# Patient Record
Sex: Female | Born: 1950
Health system: Southern US, Community
[De-identification: ages and names within clinical notes are randomized; demographics above are authoritative.]

## PROBLEM LIST (undated history)

## (undated) DIAGNOSIS — I10 Essential (primary) hypertension: Secondary | ICD-10-CM

## (undated) DIAGNOSIS — E785 Hyperlipidemia, unspecified: Secondary | ICD-10-CM

## (undated) DIAGNOSIS — IMO0001 Reserved for inherently not codable concepts without codable children: Secondary | ICD-10-CM

## (undated) DIAGNOSIS — N289 Disorder of kidney and ureter, unspecified: Secondary | ICD-10-CM

## (undated) DIAGNOSIS — IMO0002 Reserved for concepts with insufficient information to code with codable children: Secondary | ICD-10-CM

## (undated) DIAGNOSIS — J302 Other seasonal allergic rhinitis: Secondary | ICD-10-CM

## (undated) DIAGNOSIS — I729 Aneurysm of unspecified site: Secondary | ICD-10-CM

## (undated) DIAGNOSIS — E119 Type 2 diabetes mellitus without complications: Secondary | ICD-10-CM

## (undated) DIAGNOSIS — I639 Cerebral infarction, unspecified: Secondary | ICD-10-CM

## (undated) DIAGNOSIS — M1990 Unspecified osteoarthritis, unspecified site: Secondary | ICD-10-CM

## (undated) DIAGNOSIS — G5793 Unspecified mononeuropathy of bilateral lower limbs: Secondary | ICD-10-CM

## (undated) DIAGNOSIS — M109 Gout, unspecified: Secondary | ICD-10-CM

## (undated) HISTORY — DX: Unspecified osteoarthritis, unspecified site: M19.90

## (undated) HISTORY — DX: Type 2 diabetes mellitus without complications: E11.9

## (undated) HISTORY — DX: Aneurysm of unspecified site: I72.9

## (undated) HISTORY — DX: Other seasonal allergic rhinitis: J30.2

## (undated) HISTORY — DX: Hyperlipidemia, unspecified: E78.5

## (undated) HISTORY — DX: Reserved for concepts with insufficient information to code with codable children: IMO0002

## (undated) HISTORY — DX: Cerebral infarction, unspecified: I63.9

## (undated) HISTORY — DX: Gout, unspecified: M10.9

## (undated) HISTORY — PX: FOOT SURGERY: SHX648

---

## 1982-05-19 HISTORY — PX: VAGINAL HYSTERECTOMY: SUR661

## 1997-05-19 HISTORY — PX: CHOLECYSTECTOMY: SHX55

## 2001-03-20 ENCOUNTER — Emergency Department (HOSPITAL_COMMUNITY): Admission: EM | Admit: 2001-03-20 | Discharge: 2001-03-20 | Payer: Self-pay | Admitting: *Deleted

## 2001-03-20 ENCOUNTER — Encounter: Payer: Self-pay | Admitting: *Deleted

## 2002-05-23 ENCOUNTER — Encounter: Payer: Self-pay | Admitting: Unknown Physician Specialty

## 2002-05-23 ENCOUNTER — Ambulatory Visit (HOSPITAL_COMMUNITY): Admission: RE | Admit: 2002-05-23 | Discharge: 2002-05-23 | Payer: Self-pay | Admitting: Unknown Physician Specialty

## 2002-06-29 ENCOUNTER — Ambulatory Visit (HOSPITAL_COMMUNITY): Admission: RE | Admit: 2002-06-29 | Discharge: 2002-06-29 | Payer: Self-pay | Admitting: Internal Medicine

## 2003-05-01 ENCOUNTER — Emergency Department (HOSPITAL_COMMUNITY): Admission: EM | Admit: 2003-05-01 | Discharge: 2003-05-01 | Payer: Self-pay | Admitting: Emergency Medicine

## 2004-01-16 ENCOUNTER — Ambulatory Visit (HOSPITAL_COMMUNITY): Admission: RE | Admit: 2004-01-16 | Discharge: 2004-01-16 | Payer: Self-pay | Admitting: Family Medicine

## 2004-07-31 ENCOUNTER — Emergency Department (HOSPITAL_COMMUNITY): Admission: EM | Admit: 2004-07-31 | Discharge: 2004-07-31 | Payer: Self-pay | Admitting: Emergency Medicine

## 2005-02-27 ENCOUNTER — Ambulatory Visit (HOSPITAL_COMMUNITY): Admission: RE | Admit: 2005-02-27 | Discharge: 2005-02-27 | Payer: Self-pay | Admitting: Family Medicine

## 2005-09-10 ENCOUNTER — Ambulatory Visit (HOSPITAL_COMMUNITY): Admission: RE | Admit: 2005-09-10 | Discharge: 2005-09-10 | Payer: Self-pay | Admitting: Family Medicine

## 2006-07-03 ENCOUNTER — Ambulatory Visit (HOSPITAL_COMMUNITY): Admission: RE | Admit: 2006-07-03 | Discharge: 2006-07-03 | Payer: Self-pay | Admitting: Family Medicine

## 2007-06-09 ENCOUNTER — Ambulatory Visit (HOSPITAL_COMMUNITY): Admission: RE | Admit: 2007-06-09 | Discharge: 2007-06-09 | Payer: Self-pay | Admitting: Family Medicine

## 2007-07-12 ENCOUNTER — Ambulatory Visit (HOSPITAL_COMMUNITY): Admission: RE | Admit: 2007-07-12 | Discharge: 2007-07-12 | Payer: Self-pay | Admitting: Orthopedic Surgery

## 2007-08-12 ENCOUNTER — Ambulatory Visit (HOSPITAL_COMMUNITY): Admission: RE | Admit: 2007-08-12 | Discharge: 2007-08-12 | Payer: Self-pay | Admitting: Family Medicine

## 2008-10-04 ENCOUNTER — Ambulatory Visit (HOSPITAL_COMMUNITY): Admission: RE | Admit: 2008-10-04 | Discharge: 2008-10-04 | Payer: Self-pay | Admitting: Family Medicine

## 2009-10-11 ENCOUNTER — Ambulatory Visit (HOSPITAL_COMMUNITY): Admission: RE | Admit: 2009-10-11 | Discharge: 2009-10-11 | Payer: Self-pay | Admitting: Family Medicine

## 2010-05-21 ENCOUNTER — Encounter
Admission: RE | Admit: 2010-05-21 | Discharge: 2010-05-21 | Payer: Self-pay | Source: Home / Self Care | Attending: Sports Medicine | Admitting: Sports Medicine

## 2010-06-03 ENCOUNTER — Encounter
Admission: RE | Admit: 2010-06-03 | Discharge: 2010-06-03 | Payer: Self-pay | Source: Home / Self Care | Attending: Sports Medicine | Admitting: Sports Medicine

## 2010-06-08 ENCOUNTER — Other Ambulatory Visit: Payer: Self-pay | Admitting: Sports Medicine

## 2010-06-08 DIAGNOSIS — M5020 Other cervical disc displacement, unspecified cervical region: Secondary | ICD-10-CM

## 2010-06-09 ENCOUNTER — Encounter: Payer: Self-pay | Admitting: Family Medicine

## 2010-06-21 ENCOUNTER — Other Ambulatory Visit: Payer: Self-pay

## 2010-07-02 ENCOUNTER — Encounter: Payer: Self-pay | Admitting: Sports Medicine

## 2010-10-04 NOTE — Op Note (Signed)
NAME:  Cheryl Ortiz, Cheryl Ortiz                      ACCOUNT NO.:  [id number]   MEDICAL RECORD NO.:  [id number]                   PATIENT TYPE:  AMB   LOCATION:  DAY                                  FACILITY:  [facility]   PHYSICIAN:  Lionel December, M.D.                 DATE OF BIRTH:  January 11, 1951   DATE OF PROCEDURE:  06/29/2002  DATE OF DISCHARGE:                                 OPERATIVE REPORT   PROCEDURE:  Total colonoscopy with coagulation of multiple tiny polyps at  the rectosigmoid area.   INDICATIONS FOR PROCEDURE:  The patient is a 60 year old African American  female who has had two colonoscopies previously.  The first one was in 1997  and the second one was in 10/1998.  She has had multiple small polyps  coagulated.  She had two polyps removed during her last colonoscopy.  One  was an adenoma.  She is returning for surveillance exam.  She has chronic  constipation and uses laxatives on a p.r.n. basis.   The procedure was reviewed with the patient, and informed consent was  obtained.   PREOPERATIVE DIAGNOSES:  Demerol 50 mg IV, Versed 6 mg IV in divided doses.   INSTRUMENT USED:  Olympus video system.   FINDINGS:  The procedure was performed in the endoscopy suite.  The  patient's vital signs and O2 saturations were monitored during the procedure  and remained stable.  The patient was placed in the left lateral position  and rectal exam was performed.  No abnormality noted on external or digital  exam.  The scope was placed in the rectum and advanced to the region of the  sigmoid colon and beyond.  The preparation was excellent.  The scope was  passed to the cecum which was identified by the appendiceal orifice and  ileocecal valve.  As the scope was withdrawn, the colonic mucosa was  carefully examined.  Multiple small polyps were noted in the distal sigmoid  colon and rectum.  Most of these appeared to be hyperplastic.  There were  two that appeared to be adenomas.  One was at  the distal sigmoid colon and  one was in the rectum.  Both of these and several others were coagulated  using snare tip.  The scope was retroflexed to examine the anorectal  junction which was unremarkable.  The endoscope was then withdrawn.  The  patient tolerated the procedure well.   FINAL DIAGNOSIS:  Multiple tiny polyps at distal sigmoid colon and rectum.  Most of these were coagulated.  The small ones that appeared to be  hyperplastic were left alone.    RECOMMENDATIONS:  She is to continue a high fiber diet and start Citrucel  one tablespoon full daily, Lactulose one to two tablespoons full q.h.s.  p.r.n.  Prescription given for one with multiple refills.   Will also ask her not to take aspirin for 10 days.  We  will bring her back  for another exam in five years from now.                                               Lionel December, M.D.    NR/MEDQ  D:  06/29/2002  T:  06/29/2002  Job:  [id number]   cc:   Corrie Mckusick, M.D.  [address] [place]  [place]   [id number]  Fax: [phone]

## 2010-10-10 ENCOUNTER — Other Ambulatory Visit (HOSPITAL_COMMUNITY): Payer: Self-pay | Admitting: Family Medicine

## 2010-10-10 DIAGNOSIS — Z139 Encounter for screening, unspecified: Secondary | ICD-10-CM

## 2010-10-18 ENCOUNTER — Ambulatory Visit (HOSPITAL_COMMUNITY)
Admission: RE | Admit: 2010-10-18 | Discharge: 2010-10-18 | Disposition: A | Discharge status: Home / Self Care | Payer: Managed Care, Other (non HMO) | Source: Ambulatory Visit | Attending: Family Medicine | Admitting: Family Medicine

## 2010-10-18 DIAGNOSIS — Z139 Encounter for screening, unspecified: Secondary | ICD-10-CM

## 2010-10-18 DIAGNOSIS — Z1231 Encounter for screening mammogram for malignant neoplasm of breast: Secondary | ICD-10-CM | POA: Insufficient documentation

## 2011-09-10 ENCOUNTER — Other Ambulatory Visit (HOSPITAL_COMMUNITY): Payer: Self-pay | Admitting: Family Medicine

## 2011-09-10 DIAGNOSIS — Z139 Encounter for screening, unspecified: Secondary | ICD-10-CM

## 2011-10-24 ENCOUNTER — Ambulatory Visit (HOSPITAL_COMMUNITY)
Admission: RE | Admit: 2011-10-24 | Discharge: 2011-10-24 | Disposition: A | Discharge status: Home / Self Care | Payer: Managed Care, Other (non HMO) | Source: Ambulatory Visit | Attending: Family Medicine | Admitting: Family Medicine

## 2011-10-24 DIAGNOSIS — Z139 Encounter for screening, unspecified: Secondary | ICD-10-CM

## 2011-10-24 DIAGNOSIS — Z1231 Encounter for screening mammogram for malignant neoplasm of breast: Secondary | ICD-10-CM | POA: Insufficient documentation

## 2012-01-09 ENCOUNTER — Ambulatory Visit (HOSPITAL_COMMUNITY)
Admission: RE | Admit: 2012-01-09 | Discharge: 2012-01-09 | Disposition: A | Discharge status: Home / Self Care | Payer: Managed Care, Other (non HMO) | Source: Ambulatory Visit | Attending: Family Medicine | Admitting: Family Medicine

## 2012-01-09 ENCOUNTER — Other Ambulatory Visit (HOSPITAL_COMMUNITY): Payer: Self-pay | Admitting: Family Medicine

## 2012-01-09 DIAGNOSIS — G8929 Other chronic pain: Secondary | ICD-10-CM

## 2012-05-14 ENCOUNTER — Encounter (INDEPENDENT_AMBULATORY_CARE_PROVIDER_SITE_OTHER): Payer: Self-pay

## 2012-10-28 ENCOUNTER — Other Ambulatory Visit (HOSPITAL_COMMUNITY): Payer: Self-pay | Admitting: Family Medicine

## 2012-10-28 DIAGNOSIS — Z139 Encounter for screening, unspecified: Secondary | ICD-10-CM

## 2012-11-08 ENCOUNTER — Ambulatory Visit (HOSPITAL_COMMUNITY)
Admission: RE | Admit: 2012-11-08 | Discharge: 2012-11-08 | Disposition: A | Discharge status: Home / Self Care | Payer: Managed Care, Other (non HMO) | Source: Ambulatory Visit | Attending: Family Medicine | Admitting: Family Medicine

## 2012-11-08 DIAGNOSIS — Z139 Encounter for screening, unspecified: Secondary | ICD-10-CM

## 2012-11-08 DIAGNOSIS — Z1231 Encounter for screening mammogram for malignant neoplasm of breast: Secondary | ICD-10-CM | POA: Insufficient documentation

## 2013-02-17 ENCOUNTER — Encounter: Payer: Self-pay | Admitting: *Deleted

## 2013-02-17 DIAGNOSIS — E785 Hyperlipidemia, unspecified: Secondary | ICD-10-CM

## 2013-02-17 DIAGNOSIS — E119 Type 2 diabetes mellitus without complications: Secondary | ICD-10-CM | POA: Insufficient documentation

## 2013-02-17 DIAGNOSIS — I10 Essential (primary) hypertension: Secondary | ICD-10-CM

## 2013-02-17 DIAGNOSIS — M1990 Unspecified osteoarthritis, unspecified site: Secondary | ICD-10-CM

## 2013-02-18 ENCOUNTER — Encounter: Payer: Self-pay | Admitting: Cardiology

## 2013-02-18 ENCOUNTER — Ambulatory Visit (INDEPENDENT_AMBULATORY_CARE_PROVIDER_SITE_OTHER): Payer: BC Managed Care – PPO | Admitting: Cardiology

## 2013-02-18 ENCOUNTER — Encounter: Payer: Self-pay | Admitting: *Deleted

## 2013-02-18 VITALS — BP 139/86 | HR 88 | Ht 66.0 in | Wt 257.0 lb

## 2013-02-18 DIAGNOSIS — R072 Precordial pain: Secondary | ICD-10-CM

## 2013-02-18 DIAGNOSIS — E785 Hyperlipidemia, unspecified: Secondary | ICD-10-CM

## 2013-02-18 DIAGNOSIS — I10 Essential (primary) hypertension: Secondary | ICD-10-CM

## 2013-02-18 DIAGNOSIS — E119 Type 2 diabetes mellitus without complications: Secondary | ICD-10-CM | POA: Insufficient documentation

## 2013-02-18 NOTE — Progress Notes (Signed)
    Clinical Summary Cheryl Ortiz is a 62 y.o.female referred for cardiology consultation by Dr. Loleta Chance. She states that she is fairly active both with her family and also work, busy with the department of [employer]. She wanted further cardiac evaluation, prompted by her pastor's wife's recent diagnosis of CAD.   From his symptom perspective, she has noticed some fairly atypical chest pain symptoms, at times describing a sharp fleeting pain, other times a more burning discomfort. Nothing clearly associated with exertion.  She tries to walk for exercise, has not done this regularly during the hot summer months. She did do a combination run/walk 5K back in June. She has not undergone prior cardiac risk stratification. ECG today shows sinus rhythm with low voltage.  Recent lab work in September showed potassium 3.8, BUN 17, creatinine 1.1, hemoglobin A1c 6.8%.   Allergies  Allergen Reactions  . Codeine     Current Outpatient Prescriptions  Medication Sig Dispense Refill  . aspirin 81 MG tablet Take 81 mg by mouth daily.      . hydrochlorothiazide (HYDRODIURIL) 25 MG tablet Take 25 mg by mouth daily.      . metFORMIN (GLUCOPHAGE) 500 MG tablet Take 500 mg by mouth 2 (two) times daily with a meal.       No current facility-administered medications for this visit.    Past Medical History  Diagnosis Date  . Type 2 diabetes mellitus   . Hyperlipidemia   . Seasonal allergies   . Osteoarthritis     Past Surgical History  Procedure Laterality Date  . Vaginal hysterectomy  1984  . Cholecystectomy  1999    Family History  Problem Relation Age of Onset  . Hypertension Mother   . Cerebral palsy Brother   . Hypertension Brother   . Heart disease Brother   . Pneumonia Father     Social History [name] reports that she has quit smoking. Her smoking use included Cigarettes. She smoked 0.00 packs per day. She does not have any smokeless tobacco history on file. Ms.  J[place]lin reports that she does not drink alcohol.  Review of Systems No palpitations or syncope. No claudication. Stable appetite. No bleeding problems. Has had some trouble with wheezing and probable seasonal allergies. Otherwise negative.  Physical Examination Filed Vitals:   02/18/13 1351  BP: 139/86  Pulse: 88   Filed Weights   02/18/13 1351  Weight: 257 lb (116.574 kg)   Obese woman in no acute distress. HEENT: Conjunctiva and lids normal, oropharynx clear. Neck: Supple, no elevated JVP or carotid bruits, no thyromegaly. Lungs: Clear to auscultation, nonlabored breathing at rest. Cardiac: Regular rate and rhythm, no S3 or significant systolic murmur, no pericardial rub. Abdomen: Soft, nontender, bowel sounds present. Extremities: No pitting edema, distal pulses 2+. Skin: Warm and dry. Musculoskeletal: No kyphosis. Neuropsychiatric: Alert and oriented x3, affect grossly appropriate.   Problem List and Plan   Precordial pain Largely atypical symptoms, however in the setting of diabetes mellitus and hyperlipidemia. ECG shows low voltage but otherwise no acute findings. She has not undergone any prior risk stratification. Plan will be to obtain an exercise Cardiolite, we will call her with the results.  Essential hypertension, benign On HCTZ, followed by Dr. Loleta Chance.  Type 2 diabetes mellitus Recent hemoglobin A1c 6.8%, patient stated this is down from nearly 11%.  Hyperlipidemia No specific medical treatment at this time.    Jonelle Sidle, M.D., F.A.C.C.

## 2013-02-18 NOTE — Assessment & Plan Note (Signed)
Largely atypical symptoms, however in the setting of diabetes mellitus and hyperlipidemia. ECG shows low voltage but otherwise no acute findings. She has not undergone any prior risk stratification. Plan will be to obtain an exercise Cardiolite, we will call her with the results.

## 2013-02-18 NOTE — Assessment & Plan Note (Signed)
No specific medical treatment at this time.

## 2013-02-18 NOTE — Patient Instructions (Addendum)
Continue all current medications. Your physician has requested that you have en exercise stress myoview. For further information please visit [email or web address] Please follow instruction sheet, as given. Office will contact with results via phone or letter.    Follow up as needed (based on test results above)

## 2013-02-18 NOTE — Assessment & Plan Note (Signed)
On HCTZ, followed by Dr. Loleta Chance.

## 2013-02-18 NOTE — Assessment & Plan Note (Signed)
Recent hemoglobin A1c 6.8%, patient stated this is down from nearly 11%.

## 2013-02-24 ENCOUNTER — Encounter (HOSPITAL_COMMUNITY): Payer: BC Managed Care – PPO

## 2013-04-21 ENCOUNTER — Telehealth: Payer: Self-pay | Admitting: *Deleted

## 2013-04-21 NOTE — Telephone Encounter (Signed)
Message copied by Ovidio Kin on Thu Apr 21, 2013 10:06 AM ------      Message from: San Jetty      Created: Thu Apr 21, 2013  9:36 AM       SHE DOES NOT WANT TO SCHEDULE THIS, SHE WILL CALL us IF SHE CHANGES HER MIND            ----- Message -----         From: Ovidio Kin, LPN         Sent: 04/21/2013   9:18 AM           To: Faythe Ghee            Pt advised at cancellation that she would call back to reschedule if she needs to on 02-24-13 for her exercise myoview, can you call pt to see if she wants to reschedule thanks        ------

## 2013-04-21 NOTE — Telephone Encounter (Signed)
FYI: Several attempts have been made to contact pt via telephone to schedule this EXERCISE MYOVIEW with no resolve, the pt answered today and gave the statement below:   SHE DOES NOT WANT TO SCHEDULE THIS, SHE WILL CALL us IF SHE CHANGES HER MIND

## 2013-05-06 ENCOUNTER — Encounter: Payer: Self-pay | Admitting: *Deleted

## 2013-05-06 ENCOUNTER — Telehealth: Payer: Self-pay | Admitting: *Deleted

## 2013-05-06 NOTE — Telephone Encounter (Signed)
Noted pt decided not to have her test advised by her provider, noted in closed phone note

## 2013-08-12 ENCOUNTER — Emergency Department (HOSPITAL_COMMUNITY)
Admission: EM | Admit: 2013-08-12 | Discharge: 2013-08-12 | Disposition: A | Discharge status: Home / Self Care | Payer: BC Managed Care – PPO | Attending: Emergency Medicine | Admitting: Emergency Medicine

## 2013-08-12 ENCOUNTER — Encounter (HOSPITAL_COMMUNITY): Payer: Self-pay | Admitting: Emergency Medicine

## 2013-08-12 ENCOUNTER — Emergency Department (HOSPITAL_COMMUNITY): Payer: BC Managed Care – PPO

## 2013-08-12 DIAGNOSIS — Z79899 Other long term (current) drug therapy: Secondary | ICD-10-CM | POA: Insufficient documentation

## 2013-08-12 DIAGNOSIS — Z7982 Long term (current) use of aspirin: Secondary | ICD-10-CM | POA: Insufficient documentation

## 2013-08-12 DIAGNOSIS — M1990 Unspecified osteoarthritis, unspecified site: Secondary | ICD-10-CM | POA: Insufficient documentation

## 2013-08-12 DIAGNOSIS — N200 Calculus of kidney: Secondary | ICD-10-CM | POA: Insufficient documentation

## 2013-08-12 DIAGNOSIS — E119 Type 2 diabetes mellitus without complications: Secondary | ICD-10-CM | POA: Insufficient documentation

## 2013-08-12 DIAGNOSIS — Z87891 Personal history of nicotine dependence: Secondary | ICD-10-CM | POA: Insufficient documentation

## 2013-08-12 LAB — COMPREHENSIVE METABOLIC PANEL
ALT: 23 U/L (ref 0–35)
AST: 27 U/L (ref 0–37)
Albumin: 3.9 g/dL (ref 3.5–5.2)
Alkaline Phosphatase: 74 U/L (ref 39–117)
BUN: 21 mg/dL (ref 6–23)
CO2: 30 mEq/L (ref 19–32)
Calcium: 9.7 mg/dL (ref 8.4–10.5)
Chloride: 97 mEq/L (ref 96–112)
Creatinine, Ser: 1.16 mg/dL — ABNORMAL HIGH (ref 0.50–1.10)
GFR calc Af Amer: 58 mL/min — ABNORMAL LOW (ref >90)
GFR calc non Af Amer: 50 mL/min — ABNORMAL LOW (ref >90)
Glucose, Bld: 139 mg/dL — ABNORMAL HIGH (ref 70–99)
Potassium: 3.6 mEq/L — ABNORMAL LOW (ref 3.7–5.3)
Sodium: 140 mEq/L (ref 137–147)
Total Bilirubin: 0.5 mg/dL (ref 0.3–1.2)
Total Protein: 7.9 g/dL (ref 6.0–8.3)

## 2013-08-12 LAB — CBC WITH DIFFERENTIAL/PLATELET
Basophils Absolute: 0.1 10*3/uL (ref 0.0–0.1)
Basophils Relative: 1 % (ref 0–1)
Eosinophils Absolute: 0.3 10*3/uL (ref 0.0–0.7)
Eosinophils Relative: 3 % (ref 0–5)
HCT: 42.4 % (ref 36.0–46.0)
Hemoglobin: 14 g/dL (ref 12.0–15.0)
Lymphocytes Relative: 19 % (ref 12–46)
Lymphs Abs: 2.1 10*3/uL (ref 0.7–4.0)
MCH: 29.7 pg (ref 26.0–34.0)
MCHC: 33 g/dL (ref 30.0–36.0)
MCV: 90 fL (ref 78.0–100.0)
Monocytes Absolute: 0.7 10*3/uL (ref 0.1–1.0)
Monocytes Relative: 7 % (ref 3–12)
Neutro Abs: 7.9 10*3/uL — ABNORMAL HIGH (ref 1.7–7.7)
Neutrophils Relative %: 71 % (ref 43–77)
Platelets: 293 10*3/uL (ref 150–400)
RBC: 4.71 MIL/uL (ref 3.87–5.11)
RDW: 13.2 % (ref 11.5–15.5)
WBC: 11.1 10*3/uL — ABNORMAL HIGH (ref 4.0–10.5)

## 2013-08-12 LAB — URINE MICROSCOPIC-ADD ON

## 2013-08-12 LAB — URINALYSIS, ROUTINE W REFLEX MICROSCOPIC
Bilirubin Urine: NEGATIVE
Glucose, UA: NEGATIVE mg/dL
Ketones, ur: NEGATIVE mg/dL
Leukocytes, UA: NEGATIVE
Nitrite: NEGATIVE
Protein, ur: NEGATIVE mg/dL
Specific Gravity, Urine: 1.025 (ref 1.005–1.030)
Urobilinogen, UA: 0.2 mg/dL (ref 0.0–1.0)
pH: 5.5 (ref 5.0–8.0)

## 2013-08-12 LAB — LIPASE, BLOOD: Lipase: 44 U/L (ref 11–59)

## 2013-08-12 MED ORDER — IOHEXOL 300 MG/ML  SOLN
50.0000 mL | Freq: Once | INTRAMUSCULAR | Status: AC | PRN
Start: 2013-08-12 — End: 2013-08-12
  Administered 2013-08-12: 50 mL via ORAL

## 2013-08-12 MED ORDER — IOHEXOL 300 MG/ML  SOLN
100.0000 mL | Freq: Once | INTRAMUSCULAR | Status: AC | PRN
  Administered 2013-08-12: 100 mL via INTRAVENOUS

## 2013-08-12 MED ORDER — SODIUM CHLORIDE 0.9 % IV BOLUS (SEPSIS)
1000.0000 mL | Freq: Once | INTRAVENOUS | Status: AC
  Administered 2013-08-12: 1000 mL via INTRAVENOUS

## 2013-08-12 MED ORDER — HYDROMORPHONE HCL PF 1 MG/ML IJ SOLN
1.0000 mg | Freq: Once | INTRAMUSCULAR | Status: AC
  Administered 2013-08-12: 1 mg via INTRAVENOUS
  Filled 2013-08-12: qty 1

## 2013-08-12 MED ORDER — ONDANSETRON HCL 4 MG/2ML IJ SOLN
4.0000 mg | Freq: Once | INTRAMUSCULAR | Status: AC
  Administered 2013-08-12: 4 mg via INTRAVENOUS
  Filled 2013-08-12: qty 2

## 2013-08-12 NOTE — ED Notes (Signed)
Rt side abd pain, onset 6 pm. NO NVD,  Frequency of urination.  No fever or chills

## 2013-08-12 NOTE — Discharge Instructions (Signed)
Kidney Stones  Kidney stones (urolithiasis) are deposits that form inside your kidneys. The intense pain is caused by the stone moving through the urinary tract. When the stone moves, the ureter goes into spasm around the stone. The stone is usually passed in the urine.   CAUSES   · A disorder that makes certain neck glands produce too much parathyroid hormone (primary hyperparathyroidism).  · A buildup of uric acid crystals, similar to gout in your joints.  · Narrowing (stricture) of the ureter.  · A kidney obstruction present at birth (congenital obstruction).  · Previous surgery on the kidney or ureters.  · Numerous kidney infections.  SYMPTOMS   · Feeling sick to your stomach (nauseous).  · Throwing up (vomiting).  · Blood in the urine (hematuria).  · Pain that usually spreads (radiates) to the groin.  · Frequency or urgency of urination.  DIAGNOSIS   · Taking a history and physical exam.  · Blood or urine tests.  · CT scan.  · Occasionally, an examination of the inside of the urinary bladder (cystoscopy) is performed.  TREATMENT   · Observation.  · Increasing your fluid intake.  · Extracorporeal shock wave lithotripsy This is a noninvasive procedure that uses shock waves to break up kidney stones.  · Surgery may be needed if you have severe pain or persistent obstruction. There are various surgical procedures. Most of the procedures are performed with the use of small instruments. Only small incisions are needed to accommodate these instruments, so recovery time is minimized.  The size, location, and chemical composition are all important variables that will determine the proper choice of action for you. Talk to your health care provider to better understand your situation so that you will minimize the risk of injury to yourself and your kidney.   HOME CARE INSTRUCTIONS   · Drink enough water and fluids to keep your urine clear or pale yellow. This will help you to pass the stone or stone fragments.  · Strain  all urine through the provided strainer. Keep all particulate matter and stones for your health care provider to see. The stone causing the pain may be as small as a grain of salt. It is very important to use the strainer each and every time you pass your urine. The collection of your stone will allow your health care provider to analyze it and verify that a stone has actually passed. The stone analysis will often identify what you can do to reduce the incidence of recurrences.  · Only take over-the-counter or prescription medicines for pain, discomfort, or fever as directed by your health care provider.  · Make a follow-up appointment with your health care provider as directed.  · Get follow-up X-rays if required. The absence of pain does not always mean that the stone has passed. It may have only stopped moving. If the urine remains completely obstructed, it can cause loss of kidney function or even complete destruction of the kidney. It is your responsibility to make sure X-rays and follow-ups are completed. Ultrasounds of the kidney can show blockages and the status of the kidney. Ultrasounds are not associated with any radiation and can be performed easily in a matter of minutes.  SEEK MEDICAL CARE IF:  · You experience pain that is progressive and unresponsive to any pain medicine you have been prescribed.  SEEK IMMEDIATE MEDICAL CARE IF:   · Pain cannot be controlled with the prescribed medicine.  · You have a fever   or shaking chills.  · The severity or intensity of pain increases over 18 hours and is not relieved by pain medicine.  · You develop a new onset of abdominal pain.  · You feel faint or pass out.  · You are unable to urinate.  MAKE SURE YOU:   · Understand these instructions.  · Will watch your condition.  · Will get help right away if you are not doing well or get worse.  Document Released: 05/05/2005 Document Revised: 01/05/2013 Document Reviewed: 10/06/2012  Exit[employer]® Patient Information ©2014  [employer].

## 2013-08-12 NOTE — ED Provider Notes (Signed)
CSN: [id number]     Arrival date & time 08/12/13  1930 History  This chart was scribed for Cheryl B. Karle Starch, MD by Eston Mould, ED Scribe. This patient was seen in room [facility] and the patient's care was started at 9:05 PM.   Chief Complaint  Patient presents with  . Abdominal Pain   The history is provided by the patient. No language interpreter was used.   HPI Comments: Cheryl Ortiz is a 63 y.o. female who presents to the Emergency Department complaining of ongoing RUQ abd pain that began 3 hours ago. Pt states she was cleaning her house, began to get dressed to leave home and states her abd pain gradually worsened. She states her abd pain is mostly on her R side and denies pain that radiates or on the L side. She states her pain "comes in waves". Pt states movement worsens the pain but states she is in pain in general. She states she has had frequency but denies hematuria . Pt reports having a cholecystectomy and hysterectomy in the past. Pt has no prior hx of a kidney stone. Pt denies emesis.  Past Medical History  Diagnosis Date  . Type 2 diabetes mellitus   . Hyperlipidemia   . Seasonal allergies   . Osteoarthritis    Past Surgical History  Procedure Laterality Date  . Vaginal hysterectomy  1984  . Cholecystectomy  1999   Family History  Problem Relation Age of Onset  . Hypertension Mother   . Cerebral palsy Brother   . Hypertension Brother   . Heart disease Brother   . Pneumonia Father    History  Substance Use Topics  . Smoking status: Former Smoker    Types: Cigarettes  . Smokeless tobacco: Not on file  . Alcohol Use: No   OB History   Grav Para Term Preterm Abortions TAB SAB Ect Mult Living                 Review of Systems  Gastrointestinal: Positive for abdominal pain.  A complete 10 system review of systems was obtained and all systems are negative except as noted in the HPI and PMH.   Allergies  Codeine  Home Medications    Current Outpatient Rx  Name  Route  Sig  Dispense  Refill  . aspirin 81 MG tablet   Oral   Take 81 mg by mouth daily.         . hydrochlorothiazide (HYDRODIURIL) 25 MG tablet   Oral   Take 25 mg by mouth daily.         . metFORMIN (GLUCOPHAGE) 500 MG tablet   Oral   Take 500 mg by mouth 2 (two) times daily with a meal.          BP 171/68  Pulse 84  Temp(Src) 98.1 F (36.7 C) (Oral)  Resp 20  SpO2 97%  Physical Exam  Nursing note and vitals reviewed. Constitutional: She is oriented to person, place, and time. She appears well-developed and well-nourished.  HENT:  Head: Normocephalic and atraumatic.  Eyes: EOM are normal. Pupils are equal, round, and reactive to light.  Neck: Normal range of motion. Neck supple.  Cardiovascular: Normal rate, normal heart sounds and intact distal pulses.   Pulmonary/Chest: Effort normal and breath sounds normal.  Abdominal: Bowel sounds are normal. She exhibits no distension. There is no tenderness. There is no rebound and no guarding.  Mild tenderness to RUQ.  Musculoskeletal: Normal range of motion.  She exhibits no edema and no tenderness.  Neurological: She is alert and oriented to person, place, and time. She has normal strength. No cranial nerve deficit or sensory deficit.  Skin: Skin is warm and dry. No rash noted.  Psychiatric: She has a normal mood and affect.   ED Course  Procedures DIAGNOSTIC STUDIES: Oxygen Saturation is 97% on RA, normal by my interpretation.    COORDINATION OF CARE: 9:09 PM-Discussed treatment plan which includes administer pain medications and CT abd. Pt agreed to plan.   Labs Review Labs Reviewed  URINALYSIS, ROUTINE W REFLEX MICROSCOPIC - Abnormal; Notable for the following:    Color, Urine YELLOW (*)    APPearance HAZY (*)    Hgb urine dipstick SMALL (*)    All other components within normal limits  URINE MICROSCOPIC-ADD ON - Abnormal; Notable for the following:    Casts HYALINE CASTS (*)     All other components within normal limits  CBC WITH DIFFERENTIAL - Abnormal; Notable for the following:    WBC 11.1 (*)    Neutro Abs 7.9 (*)    All other components within normal limits  COMPREHENSIVE METABOLIC PANEL - Abnormal; Notable for the following:    Potassium 3.6 (*)    Glucose, Bld 139 (*)    Creatinine, Ser 1.16 (*)    GFR calc non Af Amer 50 (*)    GFR calc Af Amer 58 (*)    All other components within normal limits  LIPASE, BLOOD   Imaging Review Ct Abdomen Pelvis W Contrast  08/12/2013   CLINICAL DATA:  Right-sided abdominal pain. Evaluate for appendicitis or kidney stone.  EXAM: CT ABDOMEN AND PELVIS WITH CONTRAST  TECHNIQUE: Multidetector CT imaging of the abdomen and pelvis was performed using the standard protocol following bolus administration of intravenous contrast.  CONTRAST:  40mL OMNIPAQUE IOHEXOL 300 MG/ML SOLN, 179mL OMNIPAQUE IOHEXOL 300 MG/ML SOLN  COMPARISON:  None.  FINDINGS: BODY WALL: Fatty periumbilical hernia.  LOWER CHEST: Unremarkable.  ABDOMEN/PELVIS:  Liver: Diffuse fatty infiltration.  Biliary: Cholecystectomy, which likely accounts for mild common bile duct prominence within the hepatic hilum.  Pancreas: Unremarkable.  Spleen: Unremarkable.  Adrenals: Unremarkable.  Kidneys and ureters: Mild right hydroureteronephrosis with perinephric and periureteric edema. Although there is no visible stone within the ureter, there is a 5 mm stone dependent in the urinary bladder. Two punctate calculi in interpolar left kidney. There are punctate stones in the upper and lower pole right kidney.  Bladder: Bladder stone as above.  Reproductive: Hysterectomy.  Bowel: Few colonic diverticula.  No obstruction. Normal appendix.  Retroperitoneum: No mass or adenopathy.  Peritoneum: No free fluid or gas.  Vascular: No acute abnormality.  OSSEOUS: Degenerative appearing sclerosis around the SI joints and symphysis pubis.  IMPRESSION: 1. 5 mm calculus in the bladder, recently  passed from the right kidney. Mild right hydroureteronephrosis is likely from persistent ureteric edema. 2. Bilateral nonobstructive nephrolithiasis.   Electronically Signed   By: Jorje Guild M.D.   On: 08/12/2013 23:17     EKG Interpretation None     MDM   Final diagnoses:  Kidney stone    CT images reviewed, discussed results with the patient. She is currently feeling better, she has urinated since returning from CT and likely has already passed the stone. Will give strainer to use at home just in case it remains in the bladder. Doubt she will need any pain meds.   I personally performed the services described in this  documentation, which was scribed in my presence. The recorded information has been reviewed and is accurate.      Cheryl B. Karle Starch, MD 08/12/13 2329

## 2013-08-12 NOTE — ED Notes (Signed)
nad noted prior to dc. Dc instructions reviewed and explained. Voiced understanding.  

## 2013-08-25 ENCOUNTER — Telehealth (HOSPITAL_COMMUNITY): Payer: Self-pay | Admitting: Dietician

## 2013-08-25 NOTE — Telephone Encounter (Signed)
Pt was a no-show for appointment (DM Class) scheduled for 08/25/2013 at 1730. 

## 2013-09-09 ENCOUNTER — Ambulatory Visit (INDEPENDENT_AMBULATORY_CARE_PROVIDER_SITE_OTHER): Payer: BC Managed Care – PPO | Admitting: Adult Health

## 2013-09-09 ENCOUNTER — Encounter: Payer: Self-pay | Admitting: Adult Health

## 2013-09-09 VITALS — BP 124/80 | HR 76 | Ht 66.0 in | Wt 254.0 lb

## 2013-09-09 DIAGNOSIS — Z1212 Encounter for screening for malignant neoplasm of rectum: Secondary | ICD-10-CM

## 2013-09-09 DIAGNOSIS — Z01419 Encounter for gynecological examination (general) (routine) without abnormal findings: Secondary | ICD-10-CM

## 2013-09-09 LAB — HEMOCCULT GUIAC POC 1CARD (OFFICE): Fecal Occult Blood, POC: NEGATIVE

## 2013-09-09 NOTE — Patient Instructions (Signed)
Physical in 1 year Mammogram yearly  Colonoscopy per Dr Laural Golden Labs with Dr Berdine Addison

## 2013-09-09 NOTE — Progress Notes (Signed)
Patient ID: Cheryl Ortiz, female   DOB: Jun 18, 1950, 63 y.o.   MRN: [id number] History of Present Illness: Cheryl Ortiz is a 63 year old black female,widowed in for a physical.She sp hysterectomy for fibroids.   Current Medications, Allergies, Past Medical History, Past Surgical History, Family History and Social History were reviewed in [employer] record.     Review of Systems: Patient denies any headaches, blurred vision, shortness of breath, chest pain, abdominal pain, problems with bowel movements, urination, not having sex, no mood swings, has occasional hot flash but is taking herbal supplement Dr Berdine Addison told her about.Has bilateral knee pain and sees Dr Para March. In [place].    Physical Exam:BP 124/80  Pulse 76  Ht 5\' 6"  (1.676 m)  Wt 254 lb (115.214 kg)  BMI 41.02 kg/m2 General:  Well developed, well nourished, no acute distress Skin:  Warm and dry Neck:  Midline trachea, normal thyroid Lungs; Clear to auscultation bilaterally Breast:  No dominant palpable mass, retraction, or nipple discharge Cardiovascular: Regular rate and rhythm Abdomen:  Soft, non tender, no hepatosplenomegaly Pelvic:  External genitalia is normal in appearance.  The vagina is normal in appearance.  The cervix and uterus are absent.  No  adnexal masses or tenderness noted. Rectal: Good sphincter tone, no polyps, or hemorrhoids felt.  Hemoccult negative. Extremities:  No swelling or varicosities noted Psych:  No mood changes, alert and cooperative, seems happy, still working and active in church.   Impression: Yearly gyn exam no pap    Plan: Physical in 1 year Mammogram yearly Labs with Dr Berdine Addison Colonoscopy per Dr Laural Golden

## 2013-10-11 ENCOUNTER — Other Ambulatory Visit (HOSPITAL_COMMUNITY): Payer: Self-pay | Admitting: Family Medicine

## 2013-10-11 DIAGNOSIS — Z1231 Encounter for screening mammogram for malignant neoplasm of breast: Secondary | ICD-10-CM

## 2013-11-11 ENCOUNTER — Ambulatory Visit (HOSPITAL_COMMUNITY): Payer: BC Managed Care – PPO

## 2013-11-28 ENCOUNTER — Ambulatory Visit (HOSPITAL_COMMUNITY)
Admission: RE | Admit: 2013-11-28 | Discharge: 2013-11-28 | Disposition: A | Discharge status: Home / Self Care | Payer: BC Managed Care – PPO | Source: Ambulatory Visit | Attending: Family Medicine | Admitting: Family Medicine

## 2013-11-28 DIAGNOSIS — Z1231 Encounter for screening mammogram for malignant neoplasm of breast: Secondary | ICD-10-CM | POA: Diagnosis not present

## 2013-12-02 ENCOUNTER — Ambulatory Visit (HOSPITAL_COMMUNITY): Payer: BC Managed Care – PPO

## 2014-03-20 ENCOUNTER — Encounter: Payer: Self-pay | Admitting: Adult Health

## 2014-03-30 ENCOUNTER — Encounter (INDEPENDENT_AMBULATORY_CARE_PROVIDER_SITE_OTHER): Payer: Self-pay | Admitting: *Deleted

## 2014-04-06 ENCOUNTER — Telehealth (INDEPENDENT_AMBULATORY_CARE_PROVIDER_SITE_OTHER): Payer: Self-pay | Admitting: *Deleted

## 2014-04-06 ENCOUNTER — Other Ambulatory Visit (INDEPENDENT_AMBULATORY_CARE_PROVIDER_SITE_OTHER): Payer: Self-pay | Admitting: *Deleted

## 2014-04-06 DIAGNOSIS — Z86010 Personal history of colonic polyps: Secondary | ICD-10-CM

## 2014-04-06 DIAGNOSIS — Z1211 Encounter for screening for malignant neoplasm of colon: Secondary | ICD-10-CM

## 2014-04-06 NOTE — Telephone Encounter (Signed)
Patient needs trilyte 

## 2014-04-07 MED ORDER — PEG 3350-KCL-NA BICARB-NACL 420 G PO SOLR: 4000.0000 mL | Freq: Once | ORAL | Status: DC

## 2014-04-18 ENCOUNTER — Telehealth (INDEPENDENT_AMBULATORY_CARE_PROVIDER_SITE_OTHER): Payer: Self-pay | Admitting: *Deleted

## 2014-04-18 NOTE — Telephone Encounter (Signed)
Referring MD/PCP: hill   Procedure: tcs  Reason/Indication:  Hx polyps  Has patient had this procedure before?  Yes, 2010 -- scanned  If so, when, by whom and where?    Is there a family history of colon cancer?  no  Who?  What age when diagnosed?    Is patient diabetic?   yes      Does patient have prosthetic heart valve?  no  Do you have a pacemaker?  no  Has patient ever had endocarditis? no  Has patient had joint replacement within last 12 months?  no  Does patient tend to be constipated or take laxatives? yes  Is patient on Coumadin, Plavix and/or Aspirin? no  Medications: metformin 500 mg bid (am & pm), hctz 25 mg daily, naproxen 520 mg bid, herbal supplement otc daily  Allergies: codeine  Medication Adjustment: hold metformin evening before and morning of  Procedure date & time:

## 2014-04-19 ENCOUNTER — Encounter (INDEPENDENT_AMBULATORY_CARE_PROVIDER_SITE_OTHER): Payer: Self-pay | Admitting: *Deleted

## 2014-04-19 ENCOUNTER — Other Ambulatory Visit (INDEPENDENT_AMBULATORY_CARE_PROVIDER_SITE_OTHER): Payer: Self-pay | Admitting: *Deleted

## 2014-04-19 DIAGNOSIS — Z86010 Personal history of colonic polyps: Secondary | ICD-10-CM

## 2014-05-15 ENCOUNTER — Ambulatory Visit (HOSPITAL_COMMUNITY)
Admission: RE | Admit: 2014-05-15 | Payer: BC Managed Care – PPO | Source: Ambulatory Visit | Admitting: Internal Medicine

## 2014-05-15 ENCOUNTER — Encounter (HOSPITAL_COMMUNITY): Admission: RE | Payer: Self-pay | Source: Ambulatory Visit

## 2014-05-15 SURGERY — COLONOSCOPY
Anesthesia: Moderate Sedation

## 2014-05-16 ENCOUNTER — Ambulatory Visit (HOSPITAL_COMMUNITY)
Admission: RE | Admit: 2014-05-16 | Discharge: 2014-05-16 | Disposition: A | Discharge status: Home / Self Care | Payer: BC Managed Care – PPO | Source: Ambulatory Visit | Attending: Family Medicine | Admitting: Family Medicine

## 2014-05-16 ENCOUNTER — Telehealth (INDEPENDENT_AMBULATORY_CARE_PROVIDER_SITE_OTHER): Payer: Self-pay | Admitting: *Deleted

## 2014-05-16 ENCOUNTER — Other Ambulatory Visit (HOSPITAL_COMMUNITY): Payer: Self-pay | Admitting: Family Medicine

## 2014-05-16 DIAGNOSIS — R0600 Dyspnea, unspecified: Secondary | ICD-10-CM | POA: Insufficient documentation

## 2014-05-16 DIAGNOSIS — R05 Cough: Secondary | ICD-10-CM | POA: Insufficient documentation

## 2014-05-16 DIAGNOSIS — R062 Wheezing: Secondary | ICD-10-CM

## 2014-05-16 DIAGNOSIS — R0989 Other specified symptoms and signs involving the circulatory and respiratory systems: Secondary | ICD-10-CM | POA: Insufficient documentation

## 2014-05-16 NOTE — Telephone Encounter (Signed)
agree

## 2014-05-16 NOTE — Telephone Encounter (Signed)
Referring MD/PCP: hill   Procedure: tcs  Reason/Indication:  Hx polyps  Has patient had this procedure before?  Yes, 2010 -- scanned  If so, when, by whom and where?    Is there a family history of colon cancer?  no  Who?  What age when diagnosed?    Is patient diabetic?   yes      Does patient have prosthetic heart valve?  no  Do you have a pacemaker?  no  Has patient ever had endocarditis? no  Has patient had joint replacement within last 12 months?  no  Does patient tend to be constipated or take laxatives? no  Is patient on Coumadin, Plavix and/or Aspirin? no  Medications: metformin 500 mg bid (am & pm), hctz 25 mg daily, naproxen 500 mg bid, otc herbal supplement daily  Allergies: codeine  Medication Adjustment: hold metformin evening before and morning of  Procedure date & time: 06/15/14 at 1215

## 2014-06-09 ENCOUNTER — Emergency Department (HOSPITAL_COMMUNITY)
Admission: EM | Admit: 2014-06-09 | Discharge: 2014-06-09 | Disposition: A | Discharge status: Home / Self Care | Payer: BLUE CROSS/BLUE SHIELD | Attending: Emergency Medicine | Admitting: Emergency Medicine

## 2014-06-09 ENCOUNTER — Encounter (HOSPITAL_COMMUNITY): Payer: Self-pay | Admitting: Emergency Medicine

## 2014-06-09 ENCOUNTER — Emergency Department (HOSPITAL_COMMUNITY): Payer: BLUE CROSS/BLUE SHIELD

## 2014-06-09 DIAGNOSIS — E119 Type 2 diabetes mellitus without complications: Secondary | ICD-10-CM | POA: Diagnosis not present

## 2014-06-09 DIAGNOSIS — E785 Hyperlipidemia, unspecified: Secondary | ICD-10-CM | POA: Diagnosis not present

## 2014-06-09 DIAGNOSIS — Z791 Long term (current) use of non-steroidal anti-inflammatories (NSAID): Secondary | ICD-10-CM | POA: Diagnosis not present

## 2014-06-09 DIAGNOSIS — M1990 Unspecified osteoarthritis, unspecified site: Secondary | ICD-10-CM | POA: Insufficient documentation

## 2014-06-09 DIAGNOSIS — J45901 Unspecified asthma with (acute) exacerbation: Secondary | ICD-10-CM | POA: Diagnosis not present

## 2014-06-09 DIAGNOSIS — R05 Cough: Secondary | ICD-10-CM | POA: Diagnosis present

## 2014-06-09 DIAGNOSIS — Z79899 Other long term (current) drug therapy: Secondary | ICD-10-CM | POA: Insufficient documentation

## 2014-06-09 DIAGNOSIS — R059 Cough, unspecified: Secondary | ICD-10-CM

## 2014-06-09 DIAGNOSIS — R062 Wheezing: Secondary | ICD-10-CM

## 2014-06-09 MED ORDER — AEROCHAMBER Z-STAT PLUS/MEDIUM MISC: 1.0000 | Freq: Once | Status: DC

## 2014-06-09 MED ORDER — ALBUTEROL (5 MG/ML) CONTINUOUS INHALATION SOLN
15.0000 mg/h | INHALATION_SOLUTION | Freq: Once | RESPIRATORY_TRACT | Status: AC
  Administered 2014-06-09: 15 mg/h via RESPIRATORY_TRACT
  Filled 2014-06-09: qty 20

## 2014-06-09 MED ORDER — DM-GUAIFENESIN ER 30-600 MG PO TB12: 1.0000 | ORAL_TABLET | Freq: Two times a day (BID) | ORAL | Status: DC | PRN

## 2014-06-09 MED ORDER — ALBUTEROL SULFATE HFA 108 (90 BASE) MCG/ACT IN AERS: 2.0000 | INHALATION_SPRAY | RESPIRATORY_TRACT | Status: DC | PRN

## 2014-06-09 MED ORDER — ALBUTEROL SULFATE HFA 108 (90 BASE) MCG/ACT IN AERS
INHALATION_SPRAY | RESPIRATORY_TRACT | Status: AC
  Filled 2014-06-09: qty 6.7

## 2014-06-09 MED ORDER — IPRATROPIUM BROMIDE 0.02 % IN SOLN
0.5000 mg | Freq: Once | RESPIRATORY_TRACT | Status: AC
  Administered 2014-06-09: 0.5 mg via RESPIRATORY_TRACT
  Filled 2014-06-09: qty 2.5

## 2014-06-09 NOTE — ED Provider Notes (Signed)
CSN: [id number]     Arrival date & time 06/09/14  0405 History   First MD Initiated Contact with Patient 06/09/14 [phone]     Chief Complaint  Patient presents with  . Cough     (Consider location/radiation/quality/duration/timing/severity/associated sxs/prior Treatment) HPI  Patient reports she's had wheezing and had to use inhalers in the past. She was last seen by her doctor on December 29 and she was started on Singulair which was helping. She states however 4 days ago she had a headache. 3 days ago she had a sore throat that's gone now. About 2 days ago she started having wheezing and a cough that is rarely productive of some green mucus. She denies fever or chills. She states she feels short of breath. She states she's been using her inhaler but it's not helping. Patient states she refuses to take steroids because she feels that is what made her have diabetes.  PCP Dr Hilma Favors Also sees Dr Berdine Addison for her diabetes  Past Medical History  Diagnosis Date  . Type 2 diabetes mellitus   . Hyperlipidemia   . Seasonal allergies   . Osteoarthritis    Past Surgical History  Procedure Laterality Date  . Vaginal hysterectomy  1984  . Cholecystectomy  1999  . Foot surgery Bilateral    Family History  Problem Relation Age of Onset  . Hypertension Mother   . Arthritis Mother   . Cancer Mother     breast   . Cerebral palsy Brother   . Early death Brother   . Hypertension Brother   . Heart disease Brother   . Pneumonia Father   . Hypertension Father   . Hyperlipidemia Sister   . Fibroids Sister   . Hypertension Brother    History  Substance Use Topics  . Smoking status: Former Smoker    Types: Cigarettes  . Smokeless tobacco: Never Used  . Alcohol Use: No     Comment: occ.   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     Allergies  Codeine and Prednisone  Home Medications   Prior to Admission medications   Medication Sig Start Date End Date Taking? Authorizing Provider  albuterol (PROVENTIL HFA) 108 (90 BASE) MCG/ACT inhaler Inhale 2 puffs into the lungs at bedtime.   Yes Historical Provider, MD  hydrochlorothiazide (HYDRODIURIL) 25 MG tablet Take 25 mg by mouth daily.   Yes Historical Provider, MD  lovastatin (MEVACOR) 20 MG tablet Take 20 mg by mouth at bedtime.   Yes Historical Provider, MD  metFORMIN (GLUCOPHAGE) 500 MG tablet Take 500 mg by mouth 2 (two) times daily with a meal.   Yes Historical Provider, MD  Misc Natural Products (LYDIA PINKHAM/HERBAL COMPOUND PO) Take 1 tablet by mouth daily.   Yes Historical Provider, MD  montelukast (SINGULAIR) 10 MG tablet Take 10 mg by mouth at bedtime.   Yes Historical Provider, MD  naproxen (NAPROSYN) 500 MG tablet Take 500 mg by mouth 2 (two) times daily with a meal.   Yes Historical Provider, MD  OVER THE COUNTER MEDICATION Take 1 tablet by mouth daily. HERBAL SUPPLEMENT: Iron, Calcium, Vitamin C and E (for menstruation and menopause)   Yes Historical Provider, MD  polyethylene glycol-electrolytes (TRILYTE)  420 G solution Take 4,000 mLs by mouth once. 04/07/14  Yes Terri L Setzer, NP   BP 134/87 mmHg  Pulse 99  Temp(Src) 98 F (36.7 C) (Oral)  Resp 18  Ht 5\' 6"  (1.676 m)  Wt 250 lb (113.399 kg)  BMI 40.37 kg/m2  SpO2 95%  Vital signs normal   Physical Exam  Constitutional: She is oriented to person, place, and time. She appears well-developed and well-nourished.  Non-toxic appearance. She does not appear ill. No distress.  HENT:  Head: Normocephalic and atraumatic.  Right Ear: External ear normal.  Left Ear: External ear normal.  Nose: Nose normal. No mucosal edema or rhinorrhea.  Mouth/Throat: Oropharynx is clear and moist and mucous membranes are normal. No dental abscesses or uvula swelling.  Eyes: Conjunctivae and EOM  are normal. Pupils are equal, round, and reactive to light.  Neck: Normal range of motion and full passive range of motion without pain. Neck supple.  Cardiovascular: Normal rate, regular rhythm and normal heart sounds.  Exam reveals no gallop and no friction rub.   No murmur heard. Pulmonary/Chest: Accessory muscle usage present. Tachypnea noted. She is in respiratory distress. She has decreased breath sounds. She has wheezes. She has no rhonchi. She has no rales. She exhibits no tenderness and no crepitus.  Patient has audible wheezing at times  Abdominal: Soft. Normal appearance and bowel sounds are normal. She exhibits no distension. There is no tenderness. There is no rebound and no guarding.  Musculoskeletal: Normal range of motion. She exhibits no edema or tenderness.  Moves all extremities well.   Neurological: She is alert and oriented to person, place, and time. She has normal strength. No cranial nerve deficit.  Skin: Skin is warm, dry and intact. No rash noted. No erythema. No pallor.  Psychiatric: She has a normal mood and affect. Her speech is normal and behavior is normal. Her mood appears not anxious.  Nursing note and vitals reviewed.   ED Course  Procedures (including critical care time)  Medications  aerochamber Z-Stat Plus/medium 1 each (not administered)  albuterol (PROVENTIL,VENTOLIN) solution continuous neb (15 mg/hr Nebulization Given 06/09/14 0517)  ipratropium (ATROVENT) nebulizer solution 0.5 mg (0.5 mg Nebulization Given 06/09/14 0517)    Recheck at 6:40 AM patient states she's feeling much improved. She has improved air movement. She has rare lower pitched noises at the base. At this point she feels ready for discharge. Patient does not want to take steroids. She still has her Singulair to take. She was given a spacer to use which may make her inhaler more effective. She can take Mucinex DM over-the-counter for cough. She is advised to return if she gets a fever,  her breathing gets worse or if she still coughing after a week. She was not put on antibiotics can she is a nonsmoker and she has only had a cough for 2 days.  Labs Review Labs Reviewed - No data to display  Imaging Review Dg Chest 2 View  06/09/2014   CLINICAL DATA:  Cough and wheezing  EXAM: CHEST  2 VIEW  COMPARISON:  05/16/2014  FINDINGS: Pulmonary hyperinflation. There is no edema, consolidation, effusion, or pneumothorax. Normal heart size and mediastinal contours. Diffuse degenerative endplate spurring.  IMPRESSION: Pulmonary hyperinflation.  No edema or pneumonia.   Electronically Signed   By: Jorje Guild M.D.   On: 06/09/2014 04:47     EKG Interpretation None      MDM   Final diagnoses:  Cough  Wheezing  Exacerbation of asthma   Plan discharge  Rolland Porter, MD, Alanson Aly, MD 06/09/14 [phone]

## 2014-06-09 NOTE — ED Notes (Signed)
Pt given aerochamber by RT and instruction for use

## 2014-06-09 NOTE — ED Notes (Signed)
Pt c/o cough and chest soreness since Monday.

## 2014-06-09 NOTE — Discharge Instructions (Signed)
Drink plenty of fluids. Use your inhaler for wheezing and shortness of breath. Use the spacer with your inhaler, it will make your inhaler more effective. Recheck if you get worse such as fever, continue to have the cough for another week or if your wheezing or shortness of breath gets worse again.     Metered Dose Inhaler with Spacer Inhaled medicines are the basis of treatment of asthma and other breathing problems. Inhaled medicine can only be effective if used properly. Good technique assures that the medicine reaches the lungs. Your health care provider has asked you to use a spacer with your inhaler to help you take the medicine more effectively. A spacer is a plastic tube with a mouthpiece on one end and an opening that connects to the inhaler on the other end. Metered dose inhalers (MDIs) are used to deliver a variety of inhaled medicines. These include quick relief or rescue medicines (such as bronchodilators) and controller medicines (such as corticosteroids). The medicine is delivered by pushing down on a metal canister to release a set amount of spray. If you are using different kinds of inhalers, use your quick relief medicine to open the airways 10-15 minutes before using a steroid if instructed to do so by your health care provider. If you are unsure which inhalers to use and the order of using them, ask your health care provider, nurse, or respiratory therapist. HOW TO USE THE INHALER WITH A SPACER  Remove cap from inhaler.  If you are using the inhaler for the first time, you will need to prime it. Shake the inhaler for 5 seconds and release four puffs into the air, away from your face. Ask your health care provider or pharmacist if you have questions about priming your inhaler.  Shake inhaler for 5 seconds before each breath in (inhalation).  Place the open end of the spacer onto the mouthpiece of the inhaler.  Position the inhaler so that the top of the canister faces up and the  spacer mouthpiece faces you.  Put your index finger on the top of the medicine canister. Your thumb supports the bottom of the inhaler and the spacer.  Breathe out (exhale) normally and as completely as possible.  Immediately after exhaling, place the spacer between your teeth and into your mouth. Close your mouth tightly around the spacer.  Press the canister down with the index finger to release the medicine.  At the same time as the canister is pressed, inhale deeply and slowly until the lungs are completely filled. This should take 4-6 seconds. Keep your tongue down and out of the way.  Hold the medicine in your lungs for 5-10 seconds (10 seconds is best). This helps the medicine get into the small airways of your lungs. Exhale.  Repeat inhaling deeply through the spacer mouthpiece. Again hold that breath for up to 10 seconds (10 seconds is best). Exhale slowly. If it is difficult to take this second deep breath through the spacer, breathe normally several times through the spacer. Remove the spacer from your mouth.  Wait at least 15-30 seconds between puffs. Continue with the above steps until you have taken the number of puffs your health care provider has ordered. Do not use the inhaler more than your health care provider directs you to.  Remove spacer from the inhaler and place cap on inhaler.  Follow the directions from your health care provider or the inhaler insert for cleaning the inhaler and spacer. If you are  using a steroid inhaler, rinse your mouth with water after your last puff, gargle, and spit out the water. Do not swallow the water. AVOID:  Inhaling before or after starting the spray of medicine. It takes practice to coordinate your breathing with triggering the spray.  Inhaling through the nose (rather than the mouth) when triggering the spray. HOW TO DETERMINE IF YOUR INHALER IS FULL OR NEARLY EMPTY You cannot know when an inhaler is empty by shaking it. A few  inhalers are now being made with dose counters. Ask your health care provider for a prescription that has a dose counter if you feel you need that extra help. If your inhaler does not have a counter, ask your health care provider to help you determine the date you need to refill your inhaler. Write the refill date on a calendar or your inhaler canister. Refill your inhaler 7-10 days before it runs out. Be sure to keep an adequate supply of medicine. This includes making sure it is not expired, and you have a spare inhaler.  SEEK MEDICAL CARE IF:   Symptoms are only partially relieved with your inhaler.  You are having trouble using your inhaler.  You experience some increase in phlegm. SEEK IMMEDIATE MEDICAL CARE IF:   You feel little or no relief with your inhalers. You are still wheezing and are feeling shortness of breath or tightness in your chest or both.  You have dizziness, headaches, or fast heart rate.  You have chills, fever, or night sweats.  There is a noticeable increase in phlegm production, or there is blood in the phlegm. Document Released: 05/05/2005 Document Revised: 09/19/2013 Document Reviewed: 10/21/2012 [facility] Patient Information 2015 [place], [place]. This information is not intended to replace advice given to you by your health care provider. Make sure you discuss any questions you have with your health care provider.   Bronchospasm A bronchospasm is when the tubes that carry air in and out of your lungs (airways) spasm or tighten. During a bronchospasm it is hard to breathe. This is because the airways get smaller. A bronchospasm can be triggered by:  Allergies. These may be to animals, pollen, food, or mold.  Infection. This is a common cause of bronchospasm.  Exercise.  Irritants. These include pollution, cigarette smoke, strong odors, aerosol sprays, and paint fumes.  Weather changes.  Stress.  Being emotional. HOME CARE   Always have a plan for  getting help. Know when to call your doctor and local emergency services (911 in the [place] Know where you can get emergency care.  Only take medicines as told by your doctor.  If you were prescribed an inhaler or nebulizer machine, ask your doctor how to use it correctly. Always use a spacer with your inhaler if you were given one.  Stay calm during an attack. Try to relax and breathe more slowly.  Control your home environment:  Change your heating and air conditioning filter at least once a month.  Limit your use of fireplaces and wood stoves.  Do not  smoke. Do not  allow smoking in your home.  Avoid perfumes and fragrances.  Get rid of pests (such as roaches and mice) and their droppings.  Throw away plants if you see mold on them.  Keep your house clean and dust free.  Replace carpet with wood, tile, or vinyl flooring. Carpet can trap dander and dust.  Use allergy-proof pillows, mattress covers, and box spring covers.  Wash bed sheets and blankets  every week in hot water. Dry them in a dryer.  Use blankets that are made of polyester or cotton.  Wash hands frequently. GET HELP IF:  You have muscle aches.  You have chest pain.  The thick spit you spit or cough up (sputum) changes from clear or white to yellow, green, gray, or bloody.  The thick spit you spit or cough up gets thicker.  There are problems that may be related to the medicine you are given such as:  A rash.  Itching.  Swelling.  Trouble breathing. GET HELP RIGHT AWAY IF:  You feel you cannot breathe or catch your breath.  You cannot stop coughing.  Your treatment is not helping you breathe better.  You have very bad chest pain. MAKE SURE YOU:   Understand these instructions.  Will watch your condition.  Will get help right away if you are not doing well or get worse. Document Released: 03/02/2009 Document Revised: 05/10/2013 Document Reviewed: 10/26/2012 [facility] Patient  Information 2015 [place], [place]. This information is not intended to replace advice given to you by your health care provider. Make sure you discuss any questions you have with your health care provider.

## 2014-06-15 ENCOUNTER — Encounter (HOSPITAL_COMMUNITY)
Admission: RE | Disposition: A | Discharge status: Home / Self Care | Payer: Self-pay | Source: Ambulatory Visit | Attending: Internal Medicine

## 2014-06-15 ENCOUNTER — Encounter (HOSPITAL_COMMUNITY): Payer: Self-pay | Admitting: *Deleted

## 2014-06-15 ENCOUNTER — Ambulatory Visit (HOSPITAL_COMMUNITY)
Admission: RE | Admit: 2014-06-15 | Discharge: 2014-06-15 | Disposition: A | Discharge status: Home / Self Care | Payer: BLUE CROSS/BLUE SHIELD | Source: Ambulatory Visit | Attending: Internal Medicine | Admitting: Internal Medicine

## 2014-06-15 DIAGNOSIS — E119 Type 2 diabetes mellitus without complications: Secondary | ICD-10-CM | POA: Diagnosis not present

## 2014-06-15 DIAGNOSIS — K5730 Diverticulosis of large intestine without perforation or abscess without bleeding: Secondary | ICD-10-CM | POA: Insufficient documentation

## 2014-06-15 DIAGNOSIS — Z09 Encounter for follow-up examination after completed treatment for conditions other than malignant neoplasm: Secondary | ICD-10-CM | POA: Diagnosis present

## 2014-06-15 DIAGNOSIS — I10 Essential (primary) hypertension: Secondary | ICD-10-CM | POA: Insufficient documentation

## 2014-06-15 DIAGNOSIS — D128 Benign neoplasm of rectum: Secondary | ICD-10-CM

## 2014-06-15 DIAGNOSIS — D125 Benign neoplasm of sigmoid colon: Secondary | ICD-10-CM | POA: Diagnosis not present

## 2014-06-15 DIAGNOSIS — K621 Rectal polyp: Secondary | ICD-10-CM | POA: Insufficient documentation

## 2014-06-15 DIAGNOSIS — Z87891 Personal history of nicotine dependence: Secondary | ICD-10-CM | POA: Diagnosis not present

## 2014-06-15 DIAGNOSIS — Z8249 Family history of ischemic heart disease and other diseases of the circulatory system: Secondary | ICD-10-CM | POA: Diagnosis not present

## 2014-06-15 DIAGNOSIS — Z8261 Family history of arthritis: Secondary | ICD-10-CM | POA: Diagnosis not present

## 2014-06-15 DIAGNOSIS — E785 Hyperlipidemia, unspecified: Secondary | ICD-10-CM | POA: Diagnosis not present

## 2014-06-15 DIAGNOSIS — Z79899 Other long term (current) drug therapy: Secondary | ICD-10-CM | POA: Insufficient documentation

## 2014-06-15 DIAGNOSIS — Z86010 Personal history of colonic polyps: Secondary | ICD-10-CM

## 2014-06-15 DIAGNOSIS — M1990 Unspecified osteoarthritis, unspecified site: Secondary | ICD-10-CM | POA: Insufficient documentation

## 2014-06-15 HISTORY — PX: COLONOSCOPY: SHX5424

## 2014-06-15 HISTORY — DX: Essential (primary) hypertension: I10

## 2014-06-15 LAB — GLUCOSE, CAPILLARY: Glucose-Capillary: 125 mg/dL — ABNORMAL HIGH (ref 70–99)

## 2014-06-15 SURGERY — COLONOSCOPY
Anesthesia: Moderate Sedation

## 2014-06-15 MED ORDER — MIDAZOLAM HCL 5 MG/5ML IJ SOLN
INTRAMUSCULAR | Status: AC
  Filled 2014-06-15: qty 10

## 2014-06-15 MED ORDER — STERILE WATER FOR IRRIGATION IR SOLN
Status: DC | PRN
  Administered 2014-06-15: 11:00:00

## 2014-06-15 MED ORDER — SODIUM CHLORIDE 0.9 % IV SOLN
INTRAVENOUS | Status: DC
  Administered 2014-06-15: 11:00:00 via INTRAVENOUS

## 2014-06-15 MED ORDER — MEPERIDINE HCL 50 MG/ML IJ SOLN
INTRAMUSCULAR | Status: DC | PRN
  Administered 2014-06-15 (×2): 25 mg via INTRAVENOUS

## 2014-06-15 MED ORDER — MEPERIDINE HCL 50 MG/ML IJ SOLN
INTRAMUSCULAR | Status: AC
  Filled 2014-06-15: qty 1

## 2014-06-15 MED ORDER — MIDAZOLAM HCL 5 MG/5ML IJ SOLN
INTRAMUSCULAR | Status: DC | PRN
  Administered 2014-06-15: 2 mg via INTRAVENOUS
  Administered 2014-06-15: 1 mg via INTRAVENOUS
  Administered 2014-06-15 (×2): 2 mg via INTRAVENOUS

## 2014-06-15 NOTE — Discharge Instructions (Signed)
No aspirin or NSAIDs for 1 week. Resume other medications and high fiber diet. No driving for 24 hours. Physician will call with biopsy results.  High-Fiber Diet Fiber is found in fruits, vegetables, and grains. A high-fiber diet encourages the addition of more whole grains, legumes, fruits, and vegetables in your diet. The recommended amount of fiber for adult males is 38 g per day. For adult females, it is 25 g per day. Pregnant and lactating women should get 28 g of fiber per day. If you have a digestive or bowel problem, ask your caregiver for advice before adding high-fiber foods to your diet. Eat a variety of high-fiber foods instead of only a select few type of foods.  PURPOSE  To increase stool bulk.  To make bowel movements more regular to prevent constipation.  To lower cholesterol.  To prevent overeating. WHEN IS THIS DIET USED?  It may be used if you have constipation and hemorrhoids.  It may be used if you have uncomplicated diverticulosis (intestine condition) and irritable bowel syndrome.  It may be used if you need help with weight management.  It may be used if you want to add it to your diet as a protective measure against atherosclerosis, diabetes, and cancer. SOURCES OF FIBER  Whole-grain breads and cereals.  Fruits, such as apples, oranges, bananas, berries, prunes, and pears.  Vegetables, such as green peas, carrots, sweet potatoes, beets, broccoli, cabbage, spinach, and artichokes.  Legumes, such split peas, soy, lentils.  Almonds. FIBER CONTENT IN FOODS Starches and Grains / Dietary Fiber (g)  Cheerios, 1 cup / 3 g  Corn Flakes cereal, 1 cup / 0.7 g  Rice crispy treat cereal, 1 cup / 0.3 g  Instant oatmeal (cooked),  cup / 2 g  Frosted wheat cereal, 1 cup / 5.1 g  Brown, long-grain rice (cooked), 1 cup / 3.5 g  White, long-grain rice (cooked), 1 cup / 0.6 g  Enriched macaroni (cooked), 1 cup / 2.5 g Legumes / Dietary Fiber (g)  Baked  beans (canned, plain, or vegetarian),  cup / 5.2 g  Kidney beans (canned),  cup / 6.8 g  Pinto beans (cooked),  cup / 5.5 g Breads and Crackers / Dietary Fiber (g)  Plain or honey graham crackers, 2 squares / 0.7 g  Saltine crackers, 3 squares / 0.3 g  Plain, salted pretzels, 10 pieces / 1.8 g  Whole-wheat bread, 1 slice / 1.9 g  White bread, 1 slice / 0.7 g  Raisin bread, 1 slice / 1.2 g  Plain bagel, 3 oz / 2 g  Flour tortilla, 1 oz / 0.9 g  Corn tortilla, 1 small / 1.5 g  Hamburger or hotdog bun, 1 small / 0.9 g Fruits / Dietary Fiber (g)  Apple with skin, 1 medium / 4.4 g  Sweetened applesauce,  cup / 1.5 g  Banana,  medium / 1.5 g  Grapes, 10 grapes / 0.4 g  Orange, 1 small / 2.3 g  Raisin, 1.5 oz / 1.6 g  Melon, 1 cup / 1.4 g Vegetables / Dietary Fiber (g)  Green beans (canned),  cup / 1.3 g  Carrots (cooked),  cup / 2.3 g  Broccoli (cooked),  cup / 2.8 g  Peas (cooked),  cup / 4.4 g  Mashed potatoes,  cup / 1.6 g  Lettuce, 1 cup / 0.5 g  Corn (canned),  cup / 1.6 g  Tomato,  cup / 1.1 g Document Released: 05/05/2005 Document Revised:  11/04/2011 Document Reviewed: 08/07/2011 [employer] Patient Information 2015 [place], [place]. This information is not intended to replace advice given to you by your health care provider. Make sure you discuss any questions you have with your health care provider. Colon Polyps Polyps are lumps of extra tissue growing inside the body. Polyps can grow in the large intestine (colon). Most colon polyps are noncancerous (benign). However, some colon polyps can become cancerous over time. Polyps that are larger than a pea may be harmful. To be safe, caregivers remove and test all polyps. CAUSES  Polyps form when mutations in the genes cause your cells to grow and divide even though no more tissue is needed. RISK FACTORS There are a number of risk factors that can increase your chances of getting colon polyps.  They include:  Being older than 50 years.  Family history of colon polyps or colon cancer.  Long-term colon diseases, such as colitis or Crohn disease.  Being overweight.  Smoking.  Being inactive.  Drinking too much alcohol. SYMPTOMS  Most small polyps do not cause symptoms. If symptoms are present, they may include:  Blood in the stool. The stool may look dark red or black.  Constipation or diarrhea that lasts longer than 1 week. DIAGNOSIS People often do not know they have polyps until their caregiver finds them during a regular checkup. Your caregiver can use 4 tests to check for polyps:  Digital rectal exam. The caregiver wears gloves and feels inside the rectum. This test would find polyps only in the rectum.  Barium enema. The caregiver puts a liquid called barium into your rectum before taking X-rays of your colon. Barium makes your colon look white. Polyps are dark, so they are easy to see in the X-ray pictures.  Sigmoidoscopy. A thin, flexible tube (sigmoidoscope) is placed into your rectum. The sigmoidoscope has a light and tiny camera in it. The caregiver uses the sigmoidoscope to look at the last third of your colon.  Colonoscopy. This test is like sigmoidoscopy, but the caregiver looks at the entire colon. This is the most common method for finding and removing polyps. TREATMENT  Any polyps will be removed during a sigmoidoscopy or colonoscopy. The polyps are then tested for cancer. PREVENTION  To help lower your risk of getting more colon polyps:  Eat plenty of fruits and vegetables. Avoid eating fatty foods.  Do not smoke.  Avoid drinking alcohol.  Exercise every day.  Lose weight if recommended by your caregiver.  Eat plenty of calcium and folate. Foods that are rich in calcium include milk, cheese, and broccoli. Foods that are rich in folate include chickpeas, kidney beans, and spinach. HOME CARE INSTRUCTIONS Keep all follow-up appointments as  directed by your caregiver. You may need periodic exams to check for polyps. SEEK MEDICAL CARE IF: You notice bleeding during a bowel movement. Document Released: 01/30/2004 Document Revised: 07/28/2011 Document Reviewed: 07/15/2011 [facility] Patient Information 2015 [place], [place]. This information is not intended to replace advice given to you by your health care provider. Make sure you discuss any questions you have with your health care provider. Colonoscopy, Care After These instructions give you information on caring for yourself after your procedure. Your doctor may also give you more specific instructions. Call your doctor if you have any problems or questions after your procedure. HOME CARE  Do not drive for 24 hours.  Do not sign important papers or use machinery for 24 hours.  You may shower.  You may go back  to your usual activities, but go slower for the first 24 hours.  Take rest breaks often during the first 24 hours.  Walk around or use warm packs on your belly (abdomen) if you have belly cramping or gas.  Drink enough fluids to keep your pee (urine) clear or pale yellow.  Resume your normal diet. Avoid heavy or fried foods.  Avoid drinking alcohol for 24 hours or as told by your doctor.  Only take medicines as told by your doctor. If a tissue sample (biopsy) was taken during the procedure:   Do not take aspirin or blood thinners for 7 days, or as told by your doctor.  Do not drink alcohol for 7 days, or as told by your doctor.  Eat soft foods for the first 24 hours. GET HELP IF: You still have a small amount of blood in your poop (stool) 2-3 days after the procedure. GET HELP RIGHT AWAY IF:  You have more than a small amount of blood in your poop.  You see clumps of tissue (blood clots) in your poop.  Your belly is puffy (swollen).  You feel sick to your stomach (nauseous) or throw up (vomit).  You have a fever.  You have belly pain that gets worse  and medicine does not help. MAKE SURE YOU:  Understand these instructions.  Will watch your condition.  Will get help right away if you are not doing well or get worse. Document Released: 06/07/2010 Document Revised: 05/10/2013 Document Reviewed: 01/10/2013 [facility] Patient Information 2015 [place], [place]. This information is not intended to replace advice given to you by your health care provider. Make sure you discuss any questions you have with your health care provider.

## 2014-06-15 NOTE — Op Note (Addendum)
COLONOSCOPY PROCEDURE REPORT  PATIENT:  Cheryl Ortiz  MR#:  [id number] Birthdate:  05-Sep-1950, 64 y.o., female Endoscopist:  Dr. Rogene Houston, MD Referred By:  Dr. Purvis Kilts, MD  Procedure Date: 06/15/2014  Procedure:   Colonoscopy  Indications:  Patient is 65 year old African-American female who is undergoing surveillance colonoscopy. She has history of colonic adenomas on to prior colonoscopies of 2004 and 2010.  Informed Consent:  The procedure and risks were reviewed with the patient and informed consent was obtained.  Medications:  Demerol 50 mg IV Versed 7 mg IV  Description of procedure:  After a digital rectal exam was performed, that colonoscope was advanced from the anus through the rectum and colon to the area of the cecum, ileocecal valve and appendiceal orifice. The cecum was deeply intubated. These structures were well-seen and photographed for the record. From the level of the cecum and ileocecal valve, the scope was slowly and cautiously withdrawn. The mucosal surfaces were carefully surveyed utilizing scope tip to flexion to facilitate fold flattening as needed. The scope was pulled down into the rectum where a thorough exam including retroflexion was performed.  Findings:   Prep satisfactory. Single small erosion at cecum.  Few diverticula scattered throughout the colon. Small broad-based polyp at distal sigmoid colon. Was ablated via cold biopsy and residual polyp core ablated with snare tip. Two small distal rectal polyps are cold snared and submitted together. Three other small rectal polyps were coagulated using snare tip. Unremarkable anorectal junction.   Therapeutic/Diagnostic Maneuvers Performed:  See above  Complications:  None  Cecal Withdrawal Time:  25 minutes  Impression:  Examination performed to cecum. Single small cecal erosion felt to be secondary to NSAID use. Few scattered diverticula throughout the colon. Small polyp at  sigmoid colon ablated via cold biopsy and residual polyp coagulated dyspnea tip. Two small polyps hot snared from distal rectum and submitted together. Three small rectal polyps were coagulated using snare tip.  Recommendations:  Standard instructions given. I will contact patient with biopsy results and further recommendations.  REHMAN,NAJEEB U  06/15/2014 12:17 PM  CC: Dr. Hilma Favors, Betsy Coder, MD & Dr. Rayne Du ref. provider found

## 2014-06-15 NOTE — H&P (Signed)
Cheryl Ortiz is an 64 y.o. female.   Chief Complaint: Patient is here for colonoscopy. HPI: Patient is 64 year old African-American female was history of colonic adenomas and is here for surveillance colonoscopy. She denies abdominal pain change in bowel habits or rectal bleeding. Last colonoscopy was at [facility] in 2010 her first colonoscopy was in 2004. Family history is negative for CRC.  Past Medical History  Diagnosis Date  . Type 2 diabetes mellitus   . Hyperlipidemia   . Seasonal allergies   . Osteoarthritis   . Hypertension     Past Surgical History  Procedure Laterality Date  . Vaginal hysterectomy  1984  . Cholecystectomy  1999  . Foot surgery Bilateral     Family History  Problem Relation Age of Onset  . Hypertension Mother   . Arthritis Mother   . Cancer Mother     breast   . Cerebral palsy Brother   . Early death Brother   . Hypertension Brother   . Heart disease Brother   . Pneumonia Father   . Hypertension Father   . Hyperlipidemia Sister   . Fibroids Sister   . Hypertension Brother    Social History:  reports that she has quit smoking. Her smoking use included Cigarettes. She has a 6 pack-year smoking history. She has never used smokeless tobacco. She reports that she does not drink alcohol or use illicit drugs.  Allergies:  Allergies  Allergen Reactions  . Codeine Other (See Comments)    Sensitivity, "makes high"  . Prednisone     Medications Prior to Admission  Medication Sig Dispense Refill  . albuterol (PROVENTIL HFA;VENTOLIN HFA) 108 (90 BASE) MCG/ACT inhaler Inhale 2 puffs into the lungs every 4 (four) hours as needed. 6.7 g 0  . dextromethorphan-guaiFENesin (MUCINEX DM) 30-600 MG per 12 hr tablet Take 1 tablet by mouth 2 (two) times daily as needed for cough. 20 tablet 0  . hydrochlorothiazide (HYDRODIURIL) 25 MG tablet Take 25 mg by mouth daily.    Marland Kitchen lovastatin (MEVACOR) 20 MG tablet Take 20 mg by mouth at bedtime.    . metFORMIN  (GLUCOPHAGE) 500 MG tablet Take 500 mg by mouth 2 (two) times daily with a meal.    . Misc Natural Products (LYDIA PINKHAM/HERBAL COMPOUND PO) Take 1 tablet by mouth daily.    . montelukast (SINGULAIR) 10 MG tablet Take 10 mg by mouth at bedtime.    . naproxen (NAPROSYN) 500 MG tablet Take 500 mg by mouth 2 (two) times daily with a meal.    . OVER THE COUNTER MEDICATION Take 1 tablet by mouth daily. HERBAL SUPPLEMENT: Iron, Calcium, Vitamin C and E (for menstruation and menopause)    . polyethylene glycol-electrolytes (TRILYTE) 420 G solution Take 4,000 mLs by mouth once. 4000 mL 0  . vitamin C (ASCORBIC ACID) 500 MG tablet Take 500 mg by mouth daily.      No results found for this or any previous visit (from the past 48 hour(s)). No results found.  ROS  Blood pressure 136/77, pulse 66, temperature 97.5 F (36.4 C), temperature source Oral, resp. rate 11, height 5\' 6"  (1.676 m), weight 250 lb (113.399 kg), SpO2 99 %. Physical Exam  Constitutional: She appears well-developed and well-nourished.  HENT:  Mouth/Throat: Oropharynx is clear and moist.  Eyes: Conjunctivae are normal. No scleral icterus.  Neck: No thyromegaly present.  Cardiovascular: Normal rate, regular rhythm and normal heart sounds.   No murmur heard. Respiratory: Effort normal and breath  sounds normal.  GI: Soft. She exhibits no distension and no mass. There is no tenderness.  Musculoskeletal: She exhibits no edema.  Lymphadenopathy:    She has no cervical adenopathy.  Neurological: She is alert.  Skin: Skin is warm and dry.     Assessment/Plan History of colonic adenomas. Surveillance colonoscopy.  REHMAN,NAJEEB U 06/15/2014, 11:25 AM

## 2014-06-16 ENCOUNTER — Encounter (HOSPITAL_COMMUNITY): Payer: Self-pay | Admitting: Internal Medicine

## 2014-06-26 ENCOUNTER — Encounter (INDEPENDENT_AMBULATORY_CARE_PROVIDER_SITE_OTHER): Payer: Self-pay | Admitting: *Deleted

## 2014-11-01 ENCOUNTER — Other Ambulatory Visit (HOSPITAL_COMMUNITY): Payer: Self-pay | Admitting: Family Medicine

## 2014-11-01 DIAGNOSIS — Z1231 Encounter for screening mammogram for malignant neoplasm of breast: Secondary | ICD-10-CM

## 2014-11-30 ENCOUNTER — Ambulatory Visit (HOSPITAL_COMMUNITY)
Admission: RE | Admit: 2014-11-30 | Discharge: 2014-11-30 | Disposition: A | Discharge status: Home / Self Care | Source: Ambulatory Visit | Attending: Family Medicine | Admitting: Family Medicine

## 2014-11-30 DIAGNOSIS — Z1231 Encounter for screening mammogram for malignant neoplasm of breast: Secondary | ICD-10-CM | POA: Diagnosis not present

## 2015-01-16 ENCOUNTER — Ambulatory Visit (HOSPITAL_COMMUNITY)
Admission: RE | Admit: 2015-01-16 | Discharge: 2015-01-16 | Disposition: A | Discharge status: Home / Self Care | Source: Ambulatory Visit | Attending: Family Medicine | Admitting: Family Medicine

## 2015-01-16 ENCOUNTER — Other Ambulatory Visit (HOSPITAL_COMMUNITY): Payer: Self-pay | Admitting: Family Medicine

## 2015-01-16 DIAGNOSIS — R222 Localized swelling, mass and lump, trunk: Secondary | ICD-10-CM | POA: Diagnosis not present

## 2015-01-16 DIAGNOSIS — R609 Edema, unspecified: Secondary | ICD-10-CM

## 2015-01-16 DIAGNOSIS — M7989 Other specified soft tissue disorders: Secondary | ICD-10-CM | POA: Diagnosis not present

## 2015-02-23 ENCOUNTER — Encounter (INDEPENDENT_AMBULATORY_CARE_PROVIDER_SITE_OTHER): Payer: Self-pay | Admitting: *Deleted

## 2015-03-05 ENCOUNTER — Ambulatory Visit (INDEPENDENT_AMBULATORY_CARE_PROVIDER_SITE_OTHER): Admitting: Internal Medicine

## 2015-03-05 ENCOUNTER — Encounter (INDEPENDENT_AMBULATORY_CARE_PROVIDER_SITE_OTHER): Payer: Self-pay | Admitting: *Deleted

## 2015-03-05 ENCOUNTER — Other Ambulatory Visit (INDEPENDENT_AMBULATORY_CARE_PROVIDER_SITE_OTHER): Payer: Self-pay | Admitting: Internal Medicine

## 2015-03-05 ENCOUNTER — Encounter (INDEPENDENT_AMBULATORY_CARE_PROVIDER_SITE_OTHER): Payer: Self-pay | Admitting: Internal Medicine

## 2015-03-05 VITALS — BP 100/70 | HR 60 | Temp 98.1°F | Ht 66.0 in | Wt 245.3 lb

## 2015-03-05 DIAGNOSIS — R1310 Dysphagia, unspecified: Secondary | ICD-10-CM

## 2015-03-05 NOTE — Patient Instructions (Signed)
EGD/ED. The risks and benefits such as perforation, bleeding, and infection were reviewed with the patient and is agreeable. 

## 2015-03-05 NOTE — Progress Notes (Signed)
Subjective:    Patient ID: Cheryl Ortiz, female    DOB: Feb 28, 1951, 64 y.o.   MRN: [id number]  HPI Referred by Dr. Ethlyn Gallery for dysphagia.   Present today with epigastric tenderness. She c/o bloating.  She tells me foods feel like they are lodging in her mid esophagus.  She says oatmeal is slow to go down. Symptoms greater than 6 months. Has been on Pepcid for about 6 months which she says is not helping. Appetite is good. No weight loss. No dysphagia.  She has bloating and epigastric tenderness. She usually has a BM daily and drinks warm lemon juice for her constipation.   Last colonoscopy was in 2016 of January . Impression:  Examination performed to cecum. Single small cecal erosion felt to be secondary to NSAID use. Few scattered diverticula throughout the colon. Small polyp at sigmoid colon ablated via cold biopsy and residual polyp coagulated dyspnea tip. Two small polyps hot snared from distal rectum and submitted together. Three small rectal polyps were coagulated using snare tip.  This time patient had hyperplastic polyps. On prior colonoscopy she had adenomas. Next colonoscopy in 53 is alert she has symptoms. Report to PCP Review of Systems     Past Medical History  Diagnosis Date  . Type 2 diabetes mellitus ([place])   . Hyperlipidemia   . Seasonal allergies   . Osteoarthritis   . Hypertension     Past Surgical History  Procedure Laterality Date  . Vaginal hysterectomy  1984  . Cholecystectomy  1999  . Foot surgery Bilateral   . Colonoscopy N/A 06/15/2014    Procedure: COLONOSCOPY;  Surgeon: Rogene Houston, MD;  Location: [facility];  Service: Endoscopy;  Laterality: N/A;  1145    Allergies  Allergen Reactions  . Codeine Other (See Comments)    Sensitivity, "makes high"  . Prednisone     Current Outpatient Prescriptions on File Prior to Visit  Medication Sig Dispense Refill  . dextromethorphan-guaiFENesin (MUCINEX DM) 30-600 MG per 12 hr tablet  Take 1 tablet by mouth 2 (two) times daily as needed for cough. 20 tablet 0  . hydrochlorothiazide (HYDRODIURIL) 25 MG tablet Take 25 mg by mouth daily.    Marland Kitchen lovastatin (MEVACOR) 20 MG tablet Take 20 mg by mouth at bedtime.    . metFORMIN (GLUCOPHAGE) 500 MG tablet Take 500 mg by mouth 2 (two) times daily with a meal.    . Misc Natural Products (LYDIA PINKHAM/HERBAL COMPOUND PO) Take 1 tablet by mouth daily.    Marland Kitchen OVER THE COUNTER MEDICATION Take 1 tablet by mouth daily. HERBAL SUPPLEMENT: Iron, Calcium, Vitamin C and E (for menstruation and menopause)    . vitamin C (ASCORBIC ACID) 500 MG tablet Take 500 mg by mouth daily.     No current facility-administered medications on file prior to visit.     Objective:   Physical Exam Blood pressure 100/70, pulse 60, temperature 98.1 F (36.7 C), height 5\' 6"  (1.676 m), weight 245 lb 4.8 oz (111.267 kg). Alert and oriented. Skin warm and dry. Oral mucosa is moist.   . Sclera anicteric, conjunctivae is pink. Thyroid not enlarged. No cervical lymphadenopathy. Lungs clear. Heart regular rate and rhythm.  Abdomen is soft. Bowel sounds are positive. No hepatomegaly. No abdominal masses felt. No tenderness.  No edema to lower extremities.  Morbidly obese.        Assessment & Plan:  Dysphagia.  EGD/ED. The risks and benefits such as perforation, bleeding, and infection  were reviewed with the patient and is agreeable.

## 2015-03-23 ENCOUNTER — Ambulatory Visit (HOSPITAL_COMMUNITY)
Admission: RE | Admit: 2015-03-23 | Discharge: 2015-03-23 | Disposition: A | Discharge status: Home / Self Care | Source: Ambulatory Visit | Attending: Internal Medicine | Admitting: Internal Medicine

## 2015-03-23 ENCOUNTER — Encounter (HOSPITAL_COMMUNITY): Payer: Self-pay | Admitting: *Deleted

## 2015-03-23 ENCOUNTER — Encounter (HOSPITAL_COMMUNITY)
Admission: RE | Disposition: A | Discharge status: Home / Self Care | Payer: Self-pay | Source: Ambulatory Visit | Attending: Internal Medicine

## 2015-03-23 DIAGNOSIS — K449 Diaphragmatic hernia without obstruction or gangrene: Secondary | ICD-10-CM

## 2015-03-23 DIAGNOSIS — I10 Essential (primary) hypertension: Secondary | ICD-10-CM | POA: Insufficient documentation

## 2015-03-23 DIAGNOSIS — Z87891 Personal history of nicotine dependence: Secondary | ICD-10-CM | POA: Diagnosis not present

## 2015-03-23 DIAGNOSIS — R1310 Dysphagia, unspecified: Secondary | ICD-10-CM | POA: Insufficient documentation

## 2015-03-23 DIAGNOSIS — Z7982 Long term (current) use of aspirin: Secondary | ICD-10-CM | POA: Diagnosis not present

## 2015-03-23 DIAGNOSIS — E119 Type 2 diabetes mellitus without complications: Secondary | ICD-10-CM | POA: Diagnosis not present

## 2015-03-23 DIAGNOSIS — Z79899 Other long term (current) drug therapy: Secondary | ICD-10-CM | POA: Diagnosis not present

## 2015-03-23 DIAGNOSIS — E785 Hyperlipidemia, unspecified: Secondary | ICD-10-CM | POA: Insufficient documentation

## 2015-03-23 DIAGNOSIS — Z7984 Long term (current) use of oral hypoglycemic drugs: Secondary | ICD-10-CM | POA: Insufficient documentation

## 2015-03-23 HISTORY — DX: Reserved for inherently not codable concepts without codable children: IMO0001

## 2015-03-23 HISTORY — PX: ESOPHAGEAL DILATION: SHX303

## 2015-03-23 HISTORY — PX: ESOPHAGOGASTRODUODENOSCOPY: SHX5428

## 2015-03-23 LAB — GLUCOSE, CAPILLARY: Glucose-Capillary: 119 mg/dL — ABNORMAL HIGH (ref 65–99)

## 2015-03-23 SURGERY — EGD (ESOPHAGOGASTRODUODENOSCOPY)
Anesthesia: Moderate Sedation

## 2015-03-23 MED ORDER — MIDAZOLAM HCL 5 MG/5ML IJ SOLN
INTRAMUSCULAR | Status: AC
  Filled 2015-03-23: qty 10

## 2015-03-23 MED ORDER — MIDAZOLAM HCL 5 MG/5ML IJ SOLN
INTRAMUSCULAR | Status: DC | PRN
  Administered 2015-03-23 (×3): 2 mg via INTRAVENOUS

## 2015-03-23 MED ORDER — MEPERIDINE HCL 50 MG/ML IJ SOLN
INTRAMUSCULAR | Status: DC | PRN
  Administered 2015-03-23 (×2): 25 mg via INTRAVENOUS

## 2015-03-23 MED ORDER — ATROPINE SULFATE 1 MG/ML IJ SOLN
INTRAMUSCULAR | Status: AC
  Filled 2015-03-23: qty 1

## 2015-03-23 MED ORDER — FLUMAZENIL 0.5 MG/5ML IV SOLN
INTRAVENOUS | Status: AC
  Filled 2015-03-23: qty 5

## 2015-03-23 MED ORDER — EPINEPHRINE HCL 0.1 MG/ML IJ SOSY
PREFILLED_SYRINGE | INTRAMUSCULAR | Status: AC
  Filled 2015-03-23: qty 10

## 2015-03-23 MED ORDER — NALOXONE HCL 0.4 MG/ML IJ SOLN
INTRAMUSCULAR | Status: AC
  Filled 2015-03-23: qty 1

## 2015-03-23 MED ORDER — MEPERIDINE HCL 50 MG/ML IJ SOLN
INTRAMUSCULAR | Status: AC
  Filled 2015-03-23: qty 1

## 2015-03-23 MED ORDER — SIMETHICONE 40 MG/0.6ML PO SUSP
ORAL | Status: DC | PRN
  Administered 2015-03-23 (×2)

## 2015-03-23 MED ORDER — SODIUM CHLORIDE 0.9 % IV SOLN
INTRAVENOUS | Status: DC
  Administered 2015-03-23: 12:00:00 via INTRAVENOUS

## 2015-03-23 MED ORDER — BUTAMBEN-TETRACAINE-BENZOCAINE 2-2-14 % EX AERO
INHALATION_SPRAY | CUTANEOUS | Status: DC | PRN
  Administered 2015-03-23: 2 via TOPICAL

## 2015-03-23 NOTE — Op Note (Signed)
EGD PROCEDURE REPORT  PATIENT:  Cheryl Ortiz  MR#:  [id number] Birthdate:  1951/02/21, 64 y.o., female Endoscopist:  Dr. Rogene Houston, MD Referred By:  Dr. Rocky Morel, MD D  Procedure Date: 03/23/2015  Procedure:   EGD with ED  Indications: Patient is 64 year old African female who presents with one-year history of intermittent solid food dysphagia. She denies recurrent heartburn and has not felt any better since she has been on famotidine.            Informed Consent:  The risks, benefits, alternatives & imponderables which include, but are not limited to, bleeding, infection, perforation, drug reaction and potential missed lesion have been reviewed.  The potential for biopsy, lesion removal, esophageal dilation, etc. have also been discussed.  Questions have been answered.  All parties agreeable.  Please see history & physical in medical record for more information.  Medications:  Demerol 50 mg IV Versed 6 mg IV Cetacaine spray topically for oropharyngeal anesthesia  Description of procedure:  The endoscope was introduced through the mouth and advanced to the second portion of the duodenum without difficulty or limitations. The mucosal surfaces were surveyed very carefully during advancement of the scope and upon withdrawal.  Findings:  Esophagus:  Mucosa of the esophagus was normal. GE junction was unremarkable. GEJ:  35 cm Hiatus:  38 cm Stomach:  Stomach was empty and distended very well with insufflation. Folds in the proximal stomach were normal. Mucosa at gastric body, antrum, pyloric channel, angularis, fundus and cardia was normal. Duodenum:  Normal bulbar and post bulbar mucosa.  Therapeutic/Diagnostic Maneuvers Performed:   Esophagus dilated by passing 27 French Maloney dilator to full insertion. Esophageal mucosa was reexamined post dilation and no disruption noted.  Complications:  None  EBL: None  Impression: Small sliding hiatal hernia  otherwise normal EGD. Esophagus dilated by passing 56 French Maloney dilator but no mucosal disruption noted.  Recommendations:  Standard instructions given. Patient advised to call office with progress report next week. If she remains with dysphagia she will need further workup.  Bellamarie Pflug U  03/23/2015  12:51 PM  CC: Dr. Ethlyn Gallery, Kassie Mends, MD & Dr. Rayne Du ref. provider found

## 2015-03-23 NOTE — Discharge Instructions (Signed)
Resume usual medications and diet. No driving for 24 hours. Please call office with progress report next week.  Gastrointestinal Endoscopy, Care After Refer to this sheet in the next few weeks. These instructions provide you with information on caring for yourself after your procedure. Your caregiver may also give you more specific instructions. Your treatment has been planned according to current medical practices, but problems sometimes occur. Call your caregiver if you have any problems or questions after your procedure. HOME CARE INSTRUCTIONS  If you were given medicine to help you relax (sedative), do not drive, operate machinery, or sign important documents for 24 hours.  Avoid alcohol and hot or warm beverages for the first 24 hours after the procedure.  Only take over-the-counter or prescription medicines for pain, discomfort, or fever as directed by your caregiver. You may resume taking your normal medicines unless your caregiver tells you otherwise. Ask your caregiver when you may resume taking medicines that may cause bleeding, such as aspirin, clopidogrel, or warfarin.  You may return to your normal diet and activities on the day after your procedure, or as directed by your caregiver. Walking may help to reduce any bloated feeling in your abdomen.  Drink enough fluids to keep your urine clear or pale yellow.  You may gargle with salt water if you have a sore throat. SEEK IMMEDIATE MEDICAL CARE IF:  You have severe nausea or vomiting.  You have severe abdominal pain, abdominal cramps that last longer than 6 hours, or abdominal swelling (distention).  You have severe shoulder or back pain.  You have trouble swallowing.  You have shortness of breath, your breathing is shallow, or you are breathing faster than normal.  You have a fever or a rapid heartbeat.  You vomit blood or material that looks like coffee grounds.  You have bloody, black, or tarry stools. MAKE SURE  YOU:  Understand these instructions.  Will watch your condition.  Will get help right away if you are not doing well or get worse.   This information is not intended to replace advice given to you by your health care provider. Make sure you discuss any questions you have with your health care provider.   Document Released: 12/18/2003 Document Revised: 05/26/2014 Document Reviewed: 08/05/2011 Elsevier Interactive Patient Education [employer].

## 2015-03-23 NOTE — H&P (Signed)
Cheryl Ortiz is an 64 y.o. female.   Chief Complaint: Patient is here for EGD and ED. HPI: Patient is 64 year old African-American female who presents with one-year history of intermittent solid food dysphagia. She points to mid sternal area site of bolus obstruction. Food bolus always goes down within few minutes. She denies heartburn. She has been on famotidine since January but cannot tell any difference. She has good appetite. She denies abdominal pain or melena.  Past Medical History  Diagnosis Date  . Type 2 diabetes mellitus ([place])   . Hyperlipidemia   . Seasonal allergies   . Osteoarthritis   . Hypertension   . Cold     Past Surgical History  Procedure Laterality Date  . Vaginal hysterectomy  1984  . Cholecystectomy  1999  . Foot surgery Bilateral   . Colonoscopy N/A 06/15/2014    Procedure: COLONOSCOPY;  Surgeon: Rogene Houston, MD;  Location: [facility];  Service: Endoscopy;  Laterality: N/A;  1145    Family History  Problem Relation Age of Onset  . Hypertension Mother   . Arthritis Mother   . Cancer Mother     breast   . Cerebral palsy Brother   . Early death Brother   . Hypertension Brother   . Heart disease Brother   . Pneumonia Father   . Hypertension Father   . Hyperlipidemia Sister   . Fibroids Sister   . Hypertension Brother    Social History:  reports that she has quit smoking. Her smoking use included Cigarettes. She has a 6 pack-year smoking history. She has never used smokeless tobacco. She reports that she does not drink alcohol or use illicit drugs.  Allergies:  Allergies  Allergen Reactions  . Codeine Other (See Comments)    Sensitivity, "makes high"  . Prednisone Other (See Comments)    High blood sugar.    Medications Prior to Admission  Medication Sig Dispense Refill  . aspirin 81 MG tablet Take 81 mg by mouth daily.    . famotidine (PEPCID) 20 MG tablet Take 20 mg by mouth 2 (two) times daily.    . hydrochlorothiazide  (HYDRODIURIL) 25 MG tablet Take 25 mg by mouth daily.    Marland Kitchen lovastatin (MEVACOR) 20 MG tablet Take 20 mg by mouth at bedtime.    . metFORMIN (GLUCOPHAGE) 500 MG tablet Take 500 mg by mouth 2 (two) times daily with a meal.    . methocarbamol (ROBAXIN) 500 MG tablet Take 500 mg by mouth as needed for muscle spasms.    Marland Kitchen OVER THE COUNTER MEDICATION Take 1 tablet by mouth daily. HERBAL SUPPLEMENT: Iron, Calcium, Vitamin C and E (for menstruation and menopause)    . vitamin C (ASCORBIC ACID) 500 MG tablet Take 500 mg by mouth daily.    Marland Kitchen dextromethorphan-guaiFENesin (MUCINEX DM) 30-600 MG per 12 hr tablet Take 1 tablet by mouth 2 (two) times daily as needed for cough. 20 tablet 0    Results for orders placed or performed during the hospital encounter of 03/23/15 (from the past 48 hour(s))  Glucose, capillary     Status: Abnormal   Collection Time: 03/23/15 11:34 AM  Result Value Ref Range   Glucose-Capillary 119 (H) 65 - 99 mg/dL   No results found.  ROS  Blood pressure 147/90, pulse 63, temperature 98.4 F (36.9 C), temperature source Oral, resp. rate 12, height 5\' 6"  (1.676 m), weight 245 lb (111.131 kg), SpO2 100 %. Physical Exam  Constitutional: She appears  well-developed and well-nourished.  HENT:  Mouth/Throat: Oropharynx is clear and moist.  Eyes: Conjunctivae are normal. No scleral icterus.  Neck: No thyromegaly present.  Cardiovascular: Normal rate, regular rhythm and normal heart sounds.   No murmur heard. Respiratory: Effort normal and breath sounds normal.  GI: Soft. She exhibits no distension and no mass. There is no tenderness.  Musculoskeletal: She exhibits no edema.  Lymphadenopathy:    She has no cervical adenopathy.  Neurological: She is alert.  Skin: Skin is warm and dry.     Assessment/Plan Solid food dysphagia. EGD with ED.  REHMAN,NAJEEB U 03/23/2015, 12:18 PM

## 2015-03-28 ENCOUNTER — Encounter (HOSPITAL_COMMUNITY): Payer: Self-pay | Admitting: Internal Medicine

## 2015-06-08 ENCOUNTER — Ambulatory Visit (HOSPITAL_COMMUNITY)
Admission: RE | Admit: 2015-06-08 | Discharge: 2015-06-08 | Disposition: A | Discharge status: Home / Self Care | Source: Ambulatory Visit | Attending: Family Medicine | Admitting: Family Medicine

## 2015-06-08 ENCOUNTER — Other Ambulatory Visit (HOSPITAL_COMMUNITY): Payer: Self-pay | Admitting: Family Medicine

## 2015-06-08 DIAGNOSIS — H0520 Unspecified exophthalmos: Secondary | ICD-10-CM | POA: Insufficient documentation

## 2015-06-08 DIAGNOSIS — I739 Peripheral vascular disease, unspecified: Secondary | ICD-10-CM | POA: Diagnosis not present

## 2015-06-08 DIAGNOSIS — R51 Headache: Secondary | ICD-10-CM | POA: Insufficient documentation

## 2015-06-08 DIAGNOSIS — R531 Weakness: Secondary | ICD-10-CM

## 2015-06-08 DIAGNOSIS — M2550 Pain in unspecified joint: Secondary | ICD-10-CM | POA: Diagnosis present

## 2015-06-12 ENCOUNTER — Other Ambulatory Visit (HOSPITAL_COMMUNITY): Payer: Self-pay | Admitting: Family Medicine

## 2015-06-13 ENCOUNTER — Other Ambulatory Visit (HOSPITAL_COMMUNITY): Payer: Self-pay | Admitting: Family Medicine

## 2015-06-13 DIAGNOSIS — R9089 Other abnormal findings on diagnostic imaging of central nervous system: Secondary | ICD-10-CM

## 2015-06-18 ENCOUNTER — Emergency Department (HOSPITAL_COMMUNITY)
Admission: EM | Admit: 2015-06-18 | Discharge: 2015-06-18 | Disposition: A | Discharge status: Home / Self Care | Attending: Emergency Medicine | Admitting: Emergency Medicine

## 2015-06-18 ENCOUNTER — Emergency Department (HOSPITAL_COMMUNITY)

## 2015-06-18 ENCOUNTER — Encounter (HOSPITAL_COMMUNITY): Payer: Self-pay | Admitting: *Deleted

## 2015-06-18 DIAGNOSIS — R0600 Dyspnea, unspecified: Secondary | ICD-10-CM | POA: Diagnosis not present

## 2015-06-18 DIAGNOSIS — Z8709 Personal history of other diseases of the respiratory system: Secondary | ICD-10-CM | POA: Insufficient documentation

## 2015-06-18 DIAGNOSIS — Z87891 Personal history of nicotine dependence: Secondary | ICD-10-CM | POA: Insufficient documentation

## 2015-06-18 DIAGNOSIS — R0602 Shortness of breath: Secondary | ICD-10-CM | POA: Diagnosis present

## 2015-06-18 DIAGNOSIS — Z7982 Long term (current) use of aspirin: Secondary | ICD-10-CM | POA: Insufficient documentation

## 2015-06-18 DIAGNOSIS — I10 Essential (primary) hypertension: Secondary | ICD-10-CM | POA: Insufficient documentation

## 2015-06-18 DIAGNOSIS — Z79899 Other long term (current) drug therapy: Secondary | ICD-10-CM | POA: Diagnosis not present

## 2015-06-18 DIAGNOSIS — M1990 Unspecified osteoarthritis, unspecified site: Secondary | ICD-10-CM | POA: Diagnosis not present

## 2015-06-18 DIAGNOSIS — Z7984 Long term (current) use of oral hypoglycemic drugs: Secondary | ICD-10-CM | POA: Insufficient documentation

## 2015-06-18 DIAGNOSIS — E785 Hyperlipidemia, unspecified: Secondary | ICD-10-CM | POA: Diagnosis not present

## 2015-06-18 DIAGNOSIS — E119 Type 2 diabetes mellitus without complications: Secondary | ICD-10-CM | POA: Insufficient documentation

## 2015-06-18 DIAGNOSIS — F419 Anxiety disorder, unspecified: Secondary | ICD-10-CM | POA: Diagnosis not present

## 2015-06-18 DIAGNOSIS — R0609 Other forms of dyspnea: Secondary | ICD-10-CM

## 2015-06-18 LAB — CBC WITH DIFFERENTIAL/PLATELET
Basophils Absolute: 0.1 10*3/uL (ref 0.0–0.1)
Basophils Relative: 1 %
Eosinophils Absolute: 0.2 10*3/uL (ref 0.0–0.7)
Eosinophils Relative: 3 %
HCT: 42.2 % (ref 36.0–46.0)
Hemoglobin: 13.8 g/dL (ref 12.0–15.0)
Lymphocytes Relative: 49 %
Lymphs Abs: 3.6 10*3/uL (ref 0.7–4.0)
MCH: 29.6 pg (ref 26.0–34.0)
MCHC: 32.7 g/dL (ref 30.0–36.0)
MCV: 90.4 fL (ref 78.0–100.0)
Monocytes Absolute: 0.4 10*3/uL (ref 0.1–1.0)
Monocytes Relative: 5 %
Neutro Abs: 3 10*3/uL (ref 1.7–7.7)
Neutrophils Relative %: 42 %
Platelets: 274 10*3/uL (ref 150–400)
RBC: 4.67 MIL/uL (ref 3.87–5.11)
RDW: 13.4 % (ref 11.5–15.5)
WBC: 7.3 10*3/uL (ref 4.0–10.5)

## 2015-06-18 LAB — COMPREHENSIVE METABOLIC PANEL
ALT: 28 U/L (ref 14–54)
AST: 29 U/L (ref 15–41)
Albumin: 4.2 g/dL (ref 3.5–5.0)
Alkaline Phosphatase: 66 U/L (ref 38–126)
Anion gap: 12 (ref 5–15)
BUN: 14 mg/dL (ref 6–20)
CO2: 26 mmol/L (ref 22–32)
Calcium: 9.5 mg/dL (ref 8.9–10.3)
Chloride: 100 mmol/L — ABNORMAL LOW (ref 101–111)
Creatinine, Ser: 0.99 mg/dL (ref 0.44–1.00)
GFR calc Af Amer: >60 mL/min (ref >60)
GFR calc non Af Amer: 60 mL/min — ABNORMAL LOW (ref >60)
Glucose, Bld: 129 mg/dL — ABNORMAL HIGH (ref 65–99)
Potassium: 3.3 mmol/L — ABNORMAL LOW (ref 3.5–5.1)
Sodium: 138 mmol/L (ref 135–145)
Total Bilirubin: 0.8 mg/dL (ref 0.3–1.2)
Total Protein: 7.5 g/dL (ref 6.5–8.1)

## 2015-06-18 LAB — BRAIN NATRIURETIC PEPTIDE: B Natriuretic Peptide: 11 pg/mL (ref 0.0–100.0)

## 2015-06-18 LAB — TROPONIN I: Troponin I: <0.03 ng/mL (ref <0.031)

## 2015-06-18 MED ORDER — ALBUTEROL SULFATE (2.5 MG/3ML) 0.083% IN NEBU
INHALATION_SOLUTION | RESPIRATORY_TRACT | Status: AC
  Administered 2015-06-18: 5 mg
  Filled 2015-06-18: qty 6

## 2015-06-18 MED ORDER — PEDIALYTE PO SOLN
ORAL | Status: AC
  Filled 2015-06-18: qty 1000

## 2015-06-18 MED ORDER — LORAZEPAM 2 MG/ML IJ SOLN
0.5000 mg | Freq: Once | INTRAMUSCULAR | Status: AC
  Administered 2015-06-18: 0.5 mg via INTRAVENOUS
  Filled 2015-06-18: qty 1

## 2015-06-18 NOTE — Discharge Instructions (Signed)
Your tests tonight are good, no evidence of a heart attack, pneumonia, heart failure. Your shortness of breath may be made worse from your life stresses. Please let Dr Ethlyn Gallery know about your ED visit today, she may want to get further tests on you that we cannot do in the ED. Consider getting counseling to help you deal with your stress.    Shortness of Breath Shortness of breath means you have trouble breathing. Shortness of breath needs medical care right away. HOME CARE   Do not smoke.  Avoid being around chemicals or things (paint fumes, dust) that may bother your breathing.  Rest as needed. Slowly begin your normal activities.  Only take medicines as told by your doctor.  Keep all doctor visits as told. GET HELP RIGHT AWAY IF:   Your shortness of breath gets worse.  You feel lightheaded, pass out (faint), or have a cough that is not helped by medicine.  You cough up blood.  You have pain with breathing.  You have pain in your chest, arms, shoulders, or belly (abdomen).  You have a fever.  You cannot walk up stairs or exercise the way you normally do.  You do not get better in the time expected.  You have a hard time doing normal activities even with rest.  You have problems with your medicines.  You have any new symptoms. MAKE SURE YOU:  Understand these instructions.  Will watch your condition.  Will get help right away if you are not doing well or get worse.   This information is not intended to replace advice given to you by your health care provider. Make sure you discuss any questions you have with your health care provider.   Document Released: 10/22/2007 Document Revised: 05/10/2013 Document Reviewed: 07/21/2011 Elsevier Interactive Patient Education [employer].

## 2015-06-18 NOTE — ED Provider Notes (Signed)
CSN: [id number]     Arrival date & time 06/18/15  0518 History   First MD Initiated Contact with Patient 06/18/15 [phone]   Chief Complaint  Patient presents with  . Shortness of Breath     (Consider location/radiation/quality/duration/timing/severity/associated sxs/prior Treatment) HPI patient reports she has had shortness of breath "for a while". When asked how long that was she states since at least Christmas time. It has been off and on but it got worse yesterday. She states she started having dyspnea on exertion and even dyspnea at rest. She states she feels hot like she's going to pass out, she feels weak. She denies coughing, fever, sore throat, nausea, vomiting, diarrhea or swelling of her legs. She has had some clear rhinorrhea the past 2 days and has a history of seasonal allergies. She denies chest pain but she states her chest feels tight when she exerts herself. When asked about wheezing she states "all the time". She states for at least a year. She states however she started exercising in October and she has lost 16 pounds. She also reports she is under a lot of stress "due to crazy family". She states she has multiple family members that are stressing her out. She was seen by her PCP on January 19 and was found to have some right-sided weakness. She has had a MRI done and she scheduled to get another MRI done. She also reports she had endoscopy done in November due to difficulty swallowing and she was found to have a hiatal hernia. She reports she is not sure if she told her doctor on the 19th she was having shortness of breath.  PCP Dr Ethlyn Gallery  Past Medical History  Diagnosis Date  . Type 2 diabetes mellitus ([place])   . Hyperlipidemia   . Seasonal allergies   . Osteoarthritis   . Hypertension   . Cold    Past Surgical History  Procedure Laterality Date  . Vaginal hysterectomy  1984  . Cholecystectomy  1999  . Foot surgery Bilateral   . Colonoscopy N/A 06/15/2014    Procedure:  COLONOSCOPY;  Surgeon: Rogene Houston, MD;  [facility];  Service: Endoscopy;  Laterality: N/A;  1145  . Esophagogastroduodenoscopy N/A 03/23/2015    Procedure: ESOPHAGOGASTRODUODENOSCOPY (EGD);  Surgeon: Rogene Houston, MD;  [facility];  Service: Endoscopy;  Laterality: N/A;  8:55 - moved to 12:35 - Ann to notify pt  . Esophageal dilation N/A 03/23/2015    Procedure: ESOPHAGEAL DILATION;  Surgeon: Rogene Houston, MD;  [facility];  Service: Endoscopy;  Laterality: N/A;   Family History  Problem Relation Age of Onset  . Hypertension Mother   . Arthritis Mother   . Cancer Mother     breast   . Cerebral palsy Brother   . Early death Brother   . Hypertension Brother   . Heart disease Brother   . Pneumonia Father   . Hypertension Father   . Hyperlipidemia Sister   . Fibroids Sister   . Hypertension Brother    Social History  Substance Use Topics  . Smoking status: Former Smoker -- 0.50 packs/day for 12 years    Types: Cigarettes  . Smokeless tobacco: Never Used  . Alcohol Use: No     Comment: occ.  lives alone  OB History    Gravida Para Term Preterm AB TAB SAB Ectopic Multiple Living   2 1   1  1    1  Review of Systems  All other systems reviewed and are negative.     Allergies  Codeine and Prednisone  Home Medications   Prior to Admission medications   Medication Sig Start Date End Date Taking? Authorizing Provider  aspirin 81 MG tablet Take 81 mg by mouth daily.    Historical Provider, MD  dextromethorphan-guaiFENesin (MUCINEX DM) 30-600 MG per 12 hr tablet Take 1 tablet by mouth 2 (two) times daily as needed for cough. 06/09/14   Rolland Porter, MD  famotidine (PEPCID) 20 MG tablet Take 20 mg by mouth 2 (two) times daily.    Historical Provider, MD  hydrochlorothiazide (HYDRODIURIL) 25 MG tablet Take 25 mg by mouth daily.    Historical Provider, MD  lovastatin (MEVACOR) 20 MG tablet Take 20 mg by mouth at bedtime.    Historical  Provider, MD  metFORMIN (GLUCOPHAGE) 500 MG tablet Take 500 mg by mouth 2 (two) times daily with a meal.    Historical Provider, MD  methocarbamol (ROBAXIN) 500 MG tablet Take 500 mg by mouth as needed for muscle spasms.    Historical Provider, MD  OVER THE COUNTER MEDICATION Take 1 tablet by mouth daily. HERBAL SUPPLEMENT: Iron, Calcium, Vitamin C and E (for menstruation and menopause)    Historical Provider, MD  vitamin C (ASCORBIC ACID) 500 MG tablet Take 500 mg by mouth daily.    Historical Provider, MD   BP 139/77 mmHg  Pulse 79  Temp(Src) 97.7 F (36.5 C) (Oral)  Resp 18  Ht 5\' 6"  (1.676 m)  Wt 238 lb (107.956 kg)  BMI 38.43 kg/m2  SpO2 99%  Vital signs normal   Physical Exam  Constitutional: She is oriented to person, place, and time. She appears well-developed and well-nourished.  Non-toxic appearance. She does not appear ill. No distress.  HENT:  Head: Normocephalic and atraumatic.  Right Ear: External ear normal.  Left Ear: External ear normal.  Nose: Nose normal. No mucosal edema or rhinorrhea.  Mouth/Throat: Oropharynx is clear and moist and mucous membranes are normal. No dental abscesses or uvula swelling.  Eyes: Conjunctivae and EOM are normal. Pupils are equal, round, and reactive to light.  Neck: Normal range of motion and full passive range of motion without pain. Neck supple.  Cardiovascular: Normal rate, regular rhythm and normal heart sounds.  Exam reveals no gallop and no friction rub.   No murmur heard. Pulmonary/Chest: Effort normal. No respiratory distress. She has decreased breath sounds. She has no wheezes. She has no rhonchi. She has no rales. She exhibits no tenderness and no crepitus.  Abdominal: Soft. Normal appearance and bowel sounds are normal. She exhibits no distension. There is no tenderness. There is no rebound and no guarding.  Musculoskeletal: Normal range of motion. She exhibits no edema or tenderness.  Moves all extremities well.    Neurological: She is alert and oriented to person, place, and time. She has normal strength. No cranial nerve deficit.  Skin: Skin is warm, dry and intact. No rash noted. No erythema. No pallor.  Psychiatric: Her speech is normal and behavior is normal. Her mood appears anxious.  Nursing note and vitals reviewed.   ED Course  Procedures (including critical care time) Medications  albuterol (PROVENTIL) (2.5 MG/3ML) 0.083% nebulizer solution (5 mg  Given 06/18/15 0549)  LORazepam (ATIVAN) injection 0.5 mg (0.5 mg Intravenous Given 06/18/15 0656)   Patient was given a albuterol nebulizer treatment.  Patient was rechecked at 6:30 AM. She states her breathing is not any  different after the nebulizer. While I was going over her test results she was noted to have a lot of grunting. I asked her what was going on and she said "I'm thinking about things in my head". I asked her what she's thinking about and she states she was thinking about the problems with her relatives. She also states she's thinking about all her medical problems. I'm going to have the nursing staff walk her and see what her pulse ox does. I'm beginning to think that her symptoms are related to anxiety.  I discussed patient's MRI of her brain results which were encouraging showing no stroke however she does not seem relieved to hear that.  Patient was ambulated by nursing staff and her pulse ox remained 99%.  I have talked to patient that I do not find any significant reason for her shortness of breath. We also discussed getting some counseling for her stress. Nurse reports patient has been crying in her room. We discussed not answering the phone from the people that stress her out but she states "the damage has already been done". I offered to refer her to get help with dealing with her stress but she has refused. At this point she will be referred back to her PCP for further evaluation.   Labs Review Results for orders placed or  performed during the hospital encounter of 06/18/15  Comprehensive metabolic panel  Result Value Ref Range   Sodium 138 135 - 145 mmol/L   Potassium 3.3 (L) 3.5 - 5.1 mmol/L   Chloride 100 (L) 101 - 111 mmol/L   CO2 26 22 - 32 mmol/L   Glucose, Bld 129 (H) 65 - 99 mg/dL   BUN 14 6 - 20 mg/dL   Creatinine, Ser 0.99 0.44 - 1.00 mg/dL   Calcium 9.5 8.9 - 10.3 mg/dL   Total Protein 7.5 6.5 - 8.1 g/dL   Albumin 4.2 3.5 - 5.0 g/dL   AST 29 15 - 41 U/L   ALT 28 14 - 54 U/L   Alkaline Phosphatase 66 38 - 126 U/L   Total Bilirubin 0.8 0.3 - 1.2 mg/dL   GFR calc non Af Amer 60 (L) >60 mL/min   GFR calc Af Amer >60 >60 mL/min   Anion gap 12 5 - 15  Troponin I  Result Value Ref Range   Troponin I <0.03 <0.031 ng/mL  CBC with Differential  Result Value Ref Range   WBC 7.3 4.0 - 10.5 K/uL   RBC 4.67 3.87 - 5.11 MIL/uL   Hemoglobin 13.8 12.0 - 15.0 g/dL   HCT 42.2 36.0 - 46.0 %   MCV 90.4 78.0 - 100.0 fL   MCH 29.6 26.0 - 34.0 pg   MCHC 32.7 30.0 - 36.0 g/dL   RDW 13.4 11.5 - 15.5 %   Platelets 274 150 - 400 K/uL   Neutrophils Relative % 42 %   Neutro Abs 3.0 1.7 - 7.7 K/uL   Lymphocytes Relative 49 %   Lymphs Abs 3.6 0.7 - 4.0 K/uL   Monocytes Relative 5 %   Monocytes Absolute 0.4 0.1 - 1.0 K/uL   Eosinophils Relative 3 %   Eosinophils Absolute 0.2 0.0 - 0.7 K/uL   Basophils Relative 1 %   Basophils Absolute 0.1 0.0 - 0.1 K/uL  Brain natriuretic peptide  Result Value Ref Range   B Natriuretic Peptide 11.0 0.0 - 100.0 pg/mL    Laboratory interpretation all normal    Imaging Review Dg Chest  2 View  06/18/2015  CLINICAL DATA:  Increasing shortness of breath EXAM: CHEST  2 VIEW COMPARISON:  06/09/2014 FINDINGS: Normal heart size and mediastinal contours. Borderline hyperinflation. No acute infiltrate or edema. No effusion or pneumothorax. No acute osseous findings. IMPRESSION: No acute finding. Electronically Signed   By: Monte Fantasia M.D.   On: 06/18/2015 06:38     Mr  Brain Wo Contrast  06/08/2015  CLINICAL DATA:  65 year old hyperdense diabetic female with with hyperlipidemia presenting with weakness. Initial encounter.  IMPRESSION: No acute infarct or intracranial hemorrhage. Mild to slightly moderate small vessel disease changes. Slight increase number of vessels near the M1 segment/ middle cerebral artery bifurcation bilaterally may be normal for this patient given the symmetry rather than representing underlying vascular malformation. MR angiogram circle Willis would be helpful to evaluate this region. Mild exophthalmos. Electronically Signed   By: Genia Del M.D.   On: 06/08/2015 22:11  I have personally reviewed and evaluated these images and lab results as part of my medical decision-making.   EKG Interpretation   Date/Time:  Monday June 18 2015 05:30:20 EST Ventricular Rate:  81 PR Interval:  160 QRS Duration: 96 QT Interval:  385 QTC Calculation: 447 R Axis:   -7 Text Interpretation:  Sinus rhythm Low voltage, precordial leads Consider  anterior infarct No old tracing to compare Confirmed by Ahlaya Ende  MD-I, Lyndzie Zentz  ([id number]) on 06/18/2015 5:51:55 AM      MDM   Final diagnoses:  Dyspnea  DOE (dyspnea on exertion)    Plan discharge  Rolland Porter, MD, Barbette Or, MD 06/18/15 [phone]

## 2015-06-18 NOTE — ED Notes (Signed)
Patient with no complaints at this time. Respirations even and unlabored. Skin warm/dry. Discharge instructions reviewed with patient at this time. Patient given opportunity to voice concerns/ask questions. IV removed per policy and band-aid applied to site. Patient discharged at this time and left Emergency Department with steady gait.  

## 2015-06-18 NOTE — ED Notes (Signed)
Pt c/o sob that became worse yesterday, pt reports that she has been having problems with getting sob but it started getting worse yesterday, denies any cough, sob is worse with exertion,

## 2015-06-20 DIAGNOSIS — I729 Aneurysm of unspecified site: Secondary | ICD-10-CM

## 2015-06-20 HISTORY — DX: Aneurysm of unspecified site: I72.9

## 2015-06-28 ENCOUNTER — Ambulatory Visit (HOSPITAL_COMMUNITY)
Admission: RE | Admit: 2015-06-28 | Discharge: 2015-06-28 | Disposition: A | Discharge status: Home / Self Care | Source: Ambulatory Visit | Attending: Family Medicine | Admitting: Family Medicine

## 2015-06-28 DIAGNOSIS — I671 Cerebral aneurysm, nonruptured: Secondary | ICD-10-CM | POA: Insufficient documentation

## 2015-06-28 DIAGNOSIS — R9089 Other abnormal findings on diagnostic imaging of central nervous system: Secondary | ICD-10-CM

## 2015-06-28 DIAGNOSIS — R51 Headache: Secondary | ICD-10-CM | POA: Diagnosis not present

## 2015-06-28 DIAGNOSIS — R930 Abnormal findings on diagnostic imaging of skull and head, not elsewhere classified: Secondary | ICD-10-CM | POA: Insufficient documentation

## 2015-07-12 ENCOUNTER — Encounter: Payer: Self-pay | Admitting: Adult Health

## 2015-07-12 ENCOUNTER — Ambulatory Visit (INDEPENDENT_AMBULATORY_CARE_PROVIDER_SITE_OTHER): Admitting: Adult Health

## 2015-07-12 VITALS — BP 116/82 | HR 92 | Ht 64.5 in | Wt 246.5 lb

## 2015-07-12 DIAGNOSIS — N8110 Cystocele, unspecified: Secondary | ICD-10-CM

## 2015-07-12 DIAGNOSIS — Z1211 Encounter for screening for malignant neoplasm of colon: Secondary | ICD-10-CM

## 2015-07-12 DIAGNOSIS — Z01411 Encounter for gynecological examination (general) (routine) with abnormal findings: Secondary | ICD-10-CM

## 2015-07-12 DIAGNOSIS — Z01419 Encounter for gynecological examination (general) (routine) without abnormal findings: Secondary | ICD-10-CM

## 2015-07-12 DIAGNOSIS — IMO0002 Reserved for concepts with insufficient information to code with codable children: Secondary | ICD-10-CM

## 2015-07-12 HISTORY — DX: Reserved for concepts with insufficient information to code with codable children: IMO0002

## 2015-07-12 LAB — HEMOCCULT GUIAC POC 1CARD (OFFICE): Fecal Occult Blood, POC: NEGATIVE

## 2015-07-12 NOTE — Patient Instructions (Signed)
Physical in 1 year Mammogram yearly  Colonoscopy per Dr Laural Golden

## 2015-07-12 NOTE — Progress Notes (Signed)
Patient ID: Cheryl Ortiz, female   DOB: 1950-08-13, 65 y.o.   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 She had the flu shot in the fall.She goes to the gym about 4 days a  Week. PCP is Dr Levert Feinstein.  Current Medications, Allergies, Past Medical History, Past Surgical History, Family History and Social History were reviewed in [employer] record.     Review of Systems: Patient denies any daily headaches, hearing loss, blurred vision, chest pain, abdominal pain, problems with bowel movements, urination, or intercourse.(not having sex) No joint pain or mood swings.See HPI for positives.   Physical Exam:BP 116/82 mmHg  Pulse 92  Ht 5' 4.5" (1.638 m)  Wt 246 lb 8 oz (111.812 kg)  BMI 41.67 kg/m2 General:  Well developed, well nourished, no acute distress Skin:  Warm and dry Neck:  Midline trachea, normal thyroid, good ROM, no lymphadenopathy, no carotid bruits heard Lungs; Clear to auscultation bilaterally Breast:  No dominant palpable mass, retraction, or nipple discharge Cardiovascular: Regular rate and rhythm Abdomen:  Soft, non tender, no hepatosplenomegaly,obese Pelvic:  External genitalia is normal in appearance, no lesions.  The vagina is normal in appearance.Has cystocele. Urethra has no lesions or masses. The cervix and uterus are absent. No adnexal masses or tenderness noted.Bladder is non tender, no masses felt. Rectal: Good sphincter tone, no polyps, or hemorrhoids felt.  Hemoccult negative. Extremities/musculoskeletal:  No swelling or varicosities noted, no clubbing or cyanosis Psych:  No mood changes, alert and cooperative,seems happy   Impression: Well woman gyn exam no pap Cystocele      Plan: Physical in 1 year Mammogram yearly Colonoscopy with Dr Trenton Gammon with PCP  Follow up Dr Ulice Brilliant at [facility].

## 2015-07-17 ENCOUNTER — Emergency Department (HOSPITAL_COMMUNITY)
Admission: EM | Admit: 2015-07-17 | Discharge: 2015-07-17 | Disposition: A | Discharge status: Home / Self Care | Attending: Emergency Medicine | Admitting: Emergency Medicine

## 2015-07-17 ENCOUNTER — Encounter (HOSPITAL_COMMUNITY): Payer: Self-pay | Admitting: Emergency Medicine

## 2015-07-17 DIAGNOSIS — R319 Hematuria, unspecified: Secondary | ICD-10-CM | POA: Insufficient documentation

## 2015-07-17 DIAGNOSIS — R300 Dysuria: Secondary | ICD-10-CM | POA: Diagnosis not present

## 2015-07-17 DIAGNOSIS — Z7984 Long term (current) use of oral hypoglycemic drugs: Secondary | ICD-10-CM | POA: Insufficient documentation

## 2015-07-17 DIAGNOSIS — M1990 Unspecified osteoarthritis, unspecified site: Secondary | ICD-10-CM | POA: Insufficient documentation

## 2015-07-17 DIAGNOSIS — R350 Frequency of micturition: Secondary | ICD-10-CM | POA: Diagnosis not present

## 2015-07-17 DIAGNOSIS — Z7982 Long term (current) use of aspirin: Secondary | ICD-10-CM | POA: Insufficient documentation

## 2015-07-17 DIAGNOSIS — Z87448 Personal history of other diseases of urinary system: Secondary | ICD-10-CM | POA: Diagnosis not present

## 2015-07-17 DIAGNOSIS — Z87891 Personal history of nicotine dependence: Secondary | ICD-10-CM | POA: Insufficient documentation

## 2015-07-17 DIAGNOSIS — Z87442 Personal history of urinary calculi: Secondary | ICD-10-CM | POA: Insufficient documentation

## 2015-07-17 DIAGNOSIS — Z8719 Personal history of other diseases of the digestive system: Secondary | ICD-10-CM | POA: Insufficient documentation

## 2015-07-17 DIAGNOSIS — E119 Type 2 diabetes mellitus without complications: Secondary | ICD-10-CM | POA: Diagnosis not present

## 2015-07-17 DIAGNOSIS — I10 Essential (primary) hypertension: Secondary | ICD-10-CM | POA: Insufficient documentation

## 2015-07-17 DIAGNOSIS — Z79899 Other long term (current) drug therapy: Secondary | ICD-10-CM | POA: Insufficient documentation

## 2015-07-17 HISTORY — DX: Disorder of kidney and ureter, unspecified: N28.9

## 2015-07-17 LAB — COMPREHENSIVE METABOLIC PANEL
ALT: 28 U/L (ref 14–54)
AST: 29 U/L (ref 15–41)
Albumin: 4 g/dL (ref 3.5–5.0)
Alkaline Phosphatase: 68 U/L (ref 38–126)
Anion gap: 11 (ref 5–15)
BUN: 17 mg/dL (ref 6–20)
CO2: 25 mmol/L (ref 22–32)
Calcium: 9.3 mg/dL (ref 8.9–10.3)
Chloride: 103 mmol/L (ref 101–111)
Creatinine, Ser: 0.92 mg/dL (ref 0.44–1.00)
GFR calc Af Amer: >60 mL/min (ref >60)
GFR calc non Af Amer: >60 mL/min (ref >60)
Glucose, Bld: 157 mg/dL — ABNORMAL HIGH (ref 65–99)
Potassium: 3.5 mmol/L (ref 3.5–5.1)
Sodium: 139 mmol/L (ref 135–145)
Total Bilirubin: 0.5 mg/dL (ref 0.3–1.2)
Total Protein: 7.3 g/dL (ref 6.5–8.1)

## 2015-07-17 LAB — CBC WITH DIFFERENTIAL/PLATELET
Basophils Absolute: 0.1 10*3/uL (ref 0.0–0.1)
Basophils Relative: 1 %
Eosinophils Absolute: 0.2 10*3/uL (ref 0.0–0.7)
Eosinophils Relative: 2 %
HCT: 40.5 % (ref 36.0–46.0)
Hemoglobin: 13.2 g/dL (ref 12.0–15.0)
Lymphocytes Relative: 21 %
Lymphs Abs: 2.1 10*3/uL (ref 0.7–4.0)
MCH: 29.3 pg (ref 26.0–34.0)
MCHC: 32.6 g/dL (ref 30.0–36.0)
MCV: 90 fL (ref 78.0–100.0)
Monocytes Absolute: 0.5 10*3/uL (ref 0.1–1.0)
Monocytes Relative: 5 %
Neutro Abs: 7.2 10*3/uL (ref 1.7–7.7)
Neutrophils Relative %: 71 %
Platelets: 288 10*3/uL (ref 150–400)
RBC: 4.5 MIL/uL (ref 3.87–5.11)
RDW: 13.1 % (ref 11.5–15.5)
WBC: 10.1 10*3/uL (ref 4.0–10.5)

## 2015-07-17 LAB — URINALYSIS, ROUTINE W REFLEX MICROSCOPIC
Bilirubin Urine: NEGATIVE
Glucose, UA: 100 mg/dL — AB
Ketones, ur: NEGATIVE mg/dL
Nitrite: NEGATIVE
Protein, ur: >300 mg/dL — AB
Specific Gravity, Urine: 1.02 (ref 1.005–1.030)
pH: 7 (ref 5.0–8.0)

## 2015-07-17 LAB — URINE MICROSCOPIC-ADD ON
Bacteria, UA: NONE SEEN
Squamous Epithelial / LPF: NONE SEEN

## 2015-07-17 MED ORDER — CEPHALEXIN 500 MG PO CAPS: 500.0000 mg | ORAL_CAPSULE | Freq: Two times a day (BID) | ORAL | Status: DC

## 2015-07-17 NOTE — ED Notes (Signed)
Pt reports blood in her urine that started throughout the night, reports going to the bathroom every 5 minutes due to the amount of blood coming out. Pt reports she felt fine yesterday and had a well check up with Derrek Monaco, NP and was given a "clean bill of health, except that my bladder was sagging some". Pt reports waking up during the night with blood, along with clots, spilling in her urine. Pt reports "pelvic" pain described as discomfort with sharp pains that started with the bleeding.

## 2015-07-17 NOTE — ED Provider Notes (Signed)
History  By signing my name below, I, Marlowe Kays, attest that this documentation has been prepared under the direction and in the presence of Elnora Morrison, MD. Electronically Signed: Marlowe Kays, ED Scribe. 07/17/2015. 8:43 AM.  Chief Complaint  Patient presents with  . Hematuria   The history is provided by the patient and medical records. No language interpreter was used.    HPI Comments:  Cheryl Ortiz is a 65 y.o. obese female who presents to the Emergency Department complaining of hematuria that began about four hours ago. She reports associated dysuria and frequency. She reports seeing some clots in the urine as well as some low abdominal pain. Pt states she recently went to the doctor and was told her bladder had dropped some. Pt reports drinking some cranberry juice to help alleviate some of her symptoms. She denies modifying factors. She denies unexpected weight loss, fever, chills, nausea or vomiting. She denies PMHx of kidney or bladder cancer but reports h/o kidney stones. She denies anticoagulant therapy. She denies allergies to antibiotics.   Past Medical History  Diagnosis Date  . Type 2 diabetes mellitus ([place])   . Hyperlipidemia   . Seasonal allergies   . Osteoarthritis   . Hypertension   . Cold   . Hernia, hiatal   . Aneurysm ([place])     brain  . Cystocele 07/12/2015  . Renal disorder     kidney stones   Past Surgical History  Procedure Laterality Date  . Vaginal hysterectomy  1984  . Cholecystectomy  1999  . Foot surgery Bilateral   . Colonoscopy N/A 06/15/2014    Procedure: COLONOSCOPY;  Surgeon: Rogene Houston, MD;  [facility];  Service: Endoscopy;  Laterality: N/A;  1145  . Esophagogastroduodenoscopy N/A 03/23/2015    Procedure: ESOPHAGOGASTRODUODENOSCOPY (EGD);  Surgeon: Rogene Houston, MD;  [facility];  Service: Endoscopy;  Laterality: N/A;  8:55 - moved to 12:35 - Ann to notify pt  . Esophageal dilation N/A  03/23/2015    Procedure: ESOPHAGEAL DILATION;  Surgeon: Rogene Houston, MD;  [facility];  Service: Endoscopy;  Laterality: N/A;   Family History  Problem Relation Age of Onset  . Hypertension Mother   . Arthritis Mother   . Cancer Mother     breast   . Cerebral palsy Brother   . Early death Brother   . Hypertension Brother   . Heart disease Brother   . Pneumonia Father   . Hypertension Father   . Hyperlipidemia Sister   . Fibroids Sister   . Hypertension Brother    Social History  Substance Use Topics  . Smoking status: Former Smoker -- 0.50 packs/day for 12 years    Types: Cigarettes  . Smokeless tobacco: Never Used  . Alcohol Use: No     Comment: occ.   OB History    Gravida Para Term Preterm AB TAB SAB Ectopic Multiple Living   2 1   1  1   1      Review of Systems A complete 10 system review of systems was obtained and all systems are negative except as noted in the HPI and PMH.   Allergies  Codeine and Prednisone  Home Medications   Prior to Admission medications   Medication Sig Start Date End Date Taking? Authorizing Provider  Albuterol (PROVENTIL IN) Inhale 2 Inhalers into the lungs 2 (two) times daily.    Historical Provider, MD  aspirin 81 MG tablet Take  81 mg by mouth daily.    Historical Provider, MD  cephALEXin (KEFLEX) 500 MG capsule Take 1 capsule (500 mg total) by mouth 2 (two) times daily. 07/17/15   Elnora Morrison, MD  dextromethorphan-guaiFENesin [facility] DM) 30-600 MG per 12 hr tablet Take 1 tablet by mouth 2 (two) times daily as needed for cough. 06/09/14   Rolland Porter, MD  famotidine (PEPCID) 20 MG tablet Take 20 mg by mouth 2 (two) times daily.    Historical Provider, MD  hydrochlorothiazide (HYDRODIURIL) 25 MG tablet Take 25 mg by mouth daily.    Historical Provider, MD  lovastatin (MEVACOR) 20 MG tablet Take 20 mg by mouth at bedtime.    Historical Provider, MD  metFORMIN (GLUCOPHAGE) 500 MG tablet Take 500 mg by mouth 2 (two) times  daily with a meal.    Historical Provider, MD  methocarbamol (ROBAXIN) 500 MG tablet Take 500 mg by mouth as needed for muscle spasms.    Historical Provider, MD  OVER THE COUNTER MEDICATION Take 1 tablet by mouth daily. HERBAL SUPPLEMENT: Iron, Calcium, Vitamin C and E (for menstruation and menopause)    Historical Provider, MD  vitamin C (ASCORBIC ACID) 500 MG tablet Take 500 mg by mouth daily.    Historical Provider, MD   Triage Vitals: BP 154/99 mmHg  Pulse 98  Temp(Src) 97.6 F (36.4 C) (Oral)  Resp 22  Ht 5\' 6"  (1.676 m)  Wt 244 lb (110.678 kg)  BMI 39.40 kg/m2  SpO2 99% Physical Exam  Constitutional: She is oriented to person, place, and time. She appears well-developed and well-nourished.  HENT:  Head: Normocephalic and atraumatic.  Eyes: EOM are normal.  Neck: Normal range of motion.  Cardiovascular: Normal rate.   Pulmonary/Chest: Effort normal.  Abdominal: Soft. There is no tenderness.  Musculoskeletal: Normal range of motion.  Neurological: She is alert and oriented to person, place, and time.  Skin: Skin is warm and dry.  Psychiatric: She has a normal mood and affect. Her behavior is normal.  Nursing note and vitals reviewed.   ED Course  Procedures (including critical care time) DIAGNOSTIC STUDIES: Oxygen Saturation is 99% on RA, normal by my interpretation.   COORDINATION OF CARE: 8:25 AM- Will wait for labs to result and prescribe antibiotics. Advised to follow up with PCP and will give referral to urology. Return precautions discussed. Pt verbalizes understanding and agrees to plan.  Medications - No data to display  Labs Review Labs Reviewed  URINALYSIS, ROUTINE W REFLEX MICROSCOPIC (NOT AT [facility]) - Abnormal; Notable for the following:    Color, Urine RED (*)    APPearance TURBID (*)    Glucose, UA 100 (*)    Hgb urine dipstick LARGE (*)    Protein, ur >300 (*)    Leukocytes, UA TRACE (*)    All other components within normal limits  COMPREHENSIVE  METABOLIC PANEL - Abnormal; Notable for the following:    Glucose, Bld 157 (*)    All other components within normal limits  URINE CULTURE  CBC WITH DIFFERENTIAL/PLATELET  URINE MICROSCOPIC-ADD ON    MDM   Final diagnoses:  Hematuria  Dysuria   I personally performed the services described in this documentation, which was scribed in my presence. The recorded information has been reviewed and is accurate.  Well-appearing patient presents with dysuria and hematuria. No retention. Discussed plan for a course of antibiotics and close follow-up with urology for further evaluation.  Results and differential diagnosis were discussed with the  patient/parent/guardian. Xrays were independently reviewed by myself.  Close follow up outpatient was discussed, comfortable with the plan.   Medications - No data to display  Filed Vitals:   07/17/15 0730 07/17/15 0732  BP: 154/99 154/99  Pulse: 98 98  Temp:  97.6 F (36.4 C)  TempSrc:  Oral  Resp:  22  Height:  5\' 6"  (1.676 m)  Weight:  244 lb (110.678 kg)  SpO2: 99% 99%    Final diagnoses:  Hematuria  Dysuria      Elnora Morrison, MD 07/17/15 [phone]

## 2015-07-17 NOTE — Discharge Instructions (Signed)
If you were given medicines take as directed.  If you are on coumadin or contraceptives realize their levels and effectiveness is altered by many different medicines.  If you have any reaction (rash, tongues swelling, other) to the medicines stop taking and see a physician.    If your blood pressure was elevated in the ER make sure you follow up for management with a primary doctor or return for chest pain, shortness of breath or stroke symptoms.  Please follow up as directed and return to the ER or see a physician for new or worsening symptoms.  Thank you. Filed Vitals:   07/17/15 0730 07/17/15 0732  BP: 154/99 154/99  Pulse: 98 98  Temp:  97.6 F (36.4 C)  TempSrc:  Oral  Resp:  22  Height:  5\' 6"  (1.676 m)  Weight:  244 lb (110.678 kg)  SpO2: 99% 99%    Hematuria, Adult Hematuria is blood in your urine. It can be caused by a bladder infection, kidney infection, prostate infection, kidney stone, or cancer of your urinary tract. Infections can usually be treated with medicine, and a kidney stone usually will pass through your urine. If neither of these is the cause of your hematuria, further workup to find out the reason may be needed. It is very important that you tell your health care provider about any blood you see in your urine, even if the blood stops without treatment or happens without causing pain. Blood in your urine that happens and then stops and then happens again can be a symptom of a very serious condition. Also, pain is not a symptom in the initial stages of many urinary cancers. HOME CARE INSTRUCTIONS   Drink lots of fluid, 3-4 quarts a day. If you have been diagnosed with an infection, cranberry juice is especially recommended, in addition to large amounts of water.  Avoid caffeine, tea, and carbonated beverages because they tend to irritate the bladder.  Avoid alcohol because it may irritate the prostate.  Take all medicines as directed by your health care  provider.  If you were prescribed an antibiotic medicine, finish it all even if you start to feel better.  If you have been diagnosed with a kidney stone, follow your health care provider's instructions regarding straining your urine to catch the stone.  Empty your bladder often. Avoid holding urine for long periods of time.  After a bowel movement, women should cleanse front to back. Use each tissue only once.  Empty your bladder before and after sexual intercourse if you are a female. SEEK MEDICAL CARE IF:  You develop back pain.  You have a fever.  You have a feeling of sickness in your stomach (nausea) or vomiting.  Your symptoms are not better in 3 days. Return sooner if you are getting worse. SEEK IMMEDIATE MEDICAL CARE IF:   You develop severe vomiting and are unable to keep the medicine down.  You develop severe back or abdominal pain despite taking your medicines.  You begin passing a large amount of blood or clots in your urine.  You feel extremely weak or faint, or you pass out. MAKE SURE YOU:   Understand these instructions.  Will watch your condition.  Will get help right away if you are not doing well or get worse.   This information is not intended to replace advice given to you by your health care provider. Make sure you discuss any questions you have with your health care provider.  Document Released: 05/05/2005 Document Revised: 05/26/2014 Document Reviewed: 01/03/2013 Elsevier Interactive Patient Education [employer].

## 2015-07-17 NOTE — ED Notes (Signed)
PT c/o blood in urine with increased urinary frequency and lower abdominal pain starting throughout the night. PT states hx of kidney stones x1 year ago.

## 2015-07-19 LAB — URINE CULTURE: Culture: >=100000

## 2015-07-20 ENCOUNTER — Telehealth (HOSPITAL_COMMUNITY): Payer: Self-pay

## 2015-07-20 NOTE — Telephone Encounter (Signed)
Post ED Visit - Positive Culture Follow-up  Culture report reviewed by antimicrobial stewardship pharmacist:  []  Elenor Quinones, Pharm.D. []  Heide Guile, Pharm.D., BCPS []  Parks Neptune, Pharm.D. []  Alycia Rossetti, Pharm.D., BCPS []  Ramsay, [place].D., BCPS, AAHIVP [x]  Legrand Como, Pharm.D., BCPS, AAHIVP []  Milus Glazier, Pharm.D. []  Stephens November, Pharm.D.  Positive urine culture Treated with cephalexin, organism sensitive to the same and no further patient follow-up is required at this time.  Ileene Musa 07/20/2015, 3:58 PM

## 2015-08-15 ENCOUNTER — Other Ambulatory Visit: Payer: Self-pay | Admitting: Urology

## 2015-08-16 ENCOUNTER — Encounter (HOSPITAL_BASED_OUTPATIENT_CLINIC_OR_DEPARTMENT_OTHER): Payer: Self-pay | Admitting: *Deleted

## 2015-08-16 NOTE — Progress Notes (Signed)
To [facility] at 1315- [place] on arrival-,Ekg with chart-Instructed Npo after Mn-will take pepcid with small amt water in am.

## 2015-08-20 ENCOUNTER — Ambulatory Visit (HOSPITAL_BASED_OUTPATIENT_CLINIC_OR_DEPARTMENT_OTHER)
Admission: RE | Admit: 2015-08-20 | Discharge: 2015-08-20 | Disposition: A | Discharge status: Home / Self Care | Source: Ambulatory Visit | Attending: Urology | Admitting: Urology

## 2015-08-20 ENCOUNTER — Encounter (HOSPITAL_BASED_OUTPATIENT_CLINIC_OR_DEPARTMENT_OTHER)
Admission: RE | Disposition: A | Discharge status: Home / Self Care | Payer: Self-pay | Source: Ambulatory Visit | Attending: Urology

## 2015-08-20 ENCOUNTER — Ambulatory Visit (HOSPITAL_BASED_OUTPATIENT_CLINIC_OR_DEPARTMENT_OTHER): Admitting: Anesthesiology

## 2015-08-20 ENCOUNTER — Encounter (HOSPITAL_BASED_OUTPATIENT_CLINIC_OR_DEPARTMENT_OTHER): Payer: Self-pay | Admitting: Anesthesiology

## 2015-08-20 DIAGNOSIS — I671 Cerebral aneurysm, nonruptured: Secondary | ICD-10-CM | POA: Insufficient documentation

## 2015-08-20 DIAGNOSIS — Z87891 Personal history of nicotine dependence: Secondary | ICD-10-CM | POA: Insufficient documentation

## 2015-08-20 DIAGNOSIS — E1140 Type 2 diabetes mellitus with diabetic neuropathy, unspecified: Secondary | ICD-10-CM | POA: Insufficient documentation

## 2015-08-20 DIAGNOSIS — Z79899 Other long term (current) drug therapy: Secondary | ICD-10-CM | POA: Diagnosis not present

## 2015-08-20 DIAGNOSIS — I10 Essential (primary) hypertension: Secondary | ICD-10-CM | POA: Diagnosis not present

## 2015-08-20 DIAGNOSIS — Z7982 Long term (current) use of aspirin: Secondary | ICD-10-CM | POA: Insufficient documentation

## 2015-08-20 DIAGNOSIS — Z7984 Long term (current) use of oral hypoglycemic drugs: Secondary | ICD-10-CM | POA: Insufficient documentation

## 2015-08-20 DIAGNOSIS — M1990 Unspecified osteoarthritis, unspecified site: Secondary | ICD-10-CM | POA: Insufficient documentation

## 2015-08-20 DIAGNOSIS — N200 Calculus of kidney: Secondary | ICD-10-CM | POA: Diagnosis present

## 2015-08-20 DIAGNOSIS — E785 Hyperlipidemia, unspecified: Secondary | ICD-10-CM | POA: Diagnosis not present

## 2015-08-20 DIAGNOSIS — Z87442 Personal history of urinary calculi: Secondary | ICD-10-CM | POA: Diagnosis not present

## 2015-08-20 HISTORY — PX: CYSTOSCOPY WITH RETROGRADE PYELOGRAM, URETEROSCOPY AND STENT PLACEMENT: SHX5789

## 2015-08-20 HISTORY — DX: Unspecified mononeuropathy of bilateral lower limbs: G57.93

## 2015-08-20 HISTORY — PX: HOLMIUM LASER APPLICATION: SHX5852

## 2015-08-20 LAB — POCT I-STAT, CHEM 8
BUN: 20 mg/dL (ref 6–20)
Calcium, Ion: 1.23 mmol/L (ref 1.13–1.30)
Chloride: 101 mmol/L (ref 101–111)
Creatinine, Ser: 0.8 mg/dL (ref 0.44–1.00)
Glucose, Bld: 119 mg/dL — ABNORMAL HIGH (ref 65–99)
HCT: 59 % — ABNORMAL HIGH (ref 36.0–46.0)
Hemoglobin: 20.1 g/dL — ABNORMAL HIGH (ref 12.0–15.0)
Potassium: 3.7 mmol/L (ref 3.5–5.1)
Sodium: 142 mmol/L (ref 135–145)
TCO2: 26 mmol/L (ref 0–100)

## 2015-08-20 LAB — GLUCOSE, CAPILLARY: Glucose-Capillary: 92 mg/dL (ref 65–99)

## 2015-08-20 SURGERY — CYSTOURETEROSCOPY, WITH RETROGRADE PYELOGRAM AND STENT INSERTION
Anesthesia: General | Laterality: Bilateral

## 2015-08-20 MED ORDER — PROPOFOL 500 MG/50ML IV EMUL
INTRAVENOUS | Status: AC
  Filled 2015-08-20: qty 50

## 2015-08-20 MED ORDER — PROPOFOL 10 MG/ML IV BOLUS
INTRAVENOUS | Status: AC
  Filled 2015-08-20: qty 20

## 2015-08-20 MED ORDER — STERILE WATER FOR IRRIGATION IR SOLN
Status: DC | PRN
  Administered 2015-08-20: 500 mL

## 2015-08-20 MED ORDER — OXYCODONE HCL 5 MG PO TABS
ORAL_TABLET | ORAL | Status: AC
  Filled 2015-08-20: qty 1

## 2015-08-20 MED ORDER — DEXAMETHASONE SODIUM PHOSPHATE 4 MG/ML IJ SOLN
INTRAMUSCULAR | Status: DC | PRN
  Administered 2015-08-20: 10 mg via INTRAVENOUS

## 2015-08-20 MED ORDER — DEXAMETHASONE SODIUM PHOSPHATE 10 MG/ML IJ SOLN
INTRAMUSCULAR | Status: AC
  Filled 2015-08-20: qty 1

## 2015-08-20 MED ORDER — SULFAMETHOXAZOLE-TRIMETHOPRIM 800-160 MG PO TABS: 1.0000 | ORAL_TABLET | Freq: Two times a day (BID) | ORAL | Status: DC

## 2015-08-20 MED ORDER — OXYCODONE-ACETAMINOPHEN 5-325 MG PO TABS: 1.0000 | ORAL_TABLET | ORAL | Status: DC | PRN

## 2015-08-20 MED ORDER — LACTATED RINGERS IV SOLN
INTRAVENOUS | Status: DC
  Administered 2015-08-20 (×2): via INTRAVENOUS
  Filled 2015-08-20: qty 1000

## 2015-08-20 MED ORDER — LIDOCAINE HCL (CARDIAC) 20 MG/ML IV SOLN
INTRAVENOUS | Status: DC | PRN
  Administered 2015-08-20: 70 mg via INTRAVENOUS

## 2015-08-20 MED ORDER — ONDANSETRON HCL 4 MG/2ML IJ SOLN
INTRAMUSCULAR | Status: DC | PRN
  Administered 2015-08-20: 4 mg via INTRAVENOUS

## 2015-08-20 MED ORDER — SODIUM CHLORIDE 0.9 % IR SOLN
Status: DC | PRN
  Administered 2015-08-20: 1000 mL via INTRAVESICAL
  Administered 2015-08-20: 3000 mL via INTRAVESICAL

## 2015-08-20 MED ORDER — LIDOCAINE HCL (CARDIAC) 20 MG/ML IV SOLN
INTRAVENOUS | Status: AC
  Filled 2015-08-20: qty 5

## 2015-08-20 MED ORDER — OXYCODONE HCL 5 MG PO TABS
5.0000 mg | ORAL_TABLET | Freq: Once | ORAL | Status: AC | PRN
  Administered 2015-08-20: 5 mg via ORAL
  Filled 2015-08-20: qty 1

## 2015-08-20 MED ORDER — MIDAZOLAM HCL 5 MG/5ML IJ SOLN
INTRAMUSCULAR | Status: DC | PRN
  Administered 2015-08-20: 2 mg via INTRAVENOUS

## 2015-08-20 MED ORDER — PROPOFOL 10 MG/ML IV BOLUS
INTRAVENOUS | Status: DC | PRN
  Administered 2015-08-20: 200 mg via INTRAVENOUS

## 2015-08-20 MED ORDER — MIDAZOLAM HCL 2 MG/2ML IJ SOLN
INTRAMUSCULAR | Status: AC
  Filled 2015-08-20: qty 2

## 2015-08-20 MED ORDER — OXYCODONE HCL 5 MG/5ML PO SOLN
5.0000 mg | Freq: Once | ORAL | Status: AC | PRN
  Filled 2015-08-20: qty 5

## 2015-08-20 MED ORDER — IOPAMIDOL (ISOVUE-370) INJECTION 76%
INTRAVENOUS | Status: DC | PRN
Start: 2015-08-20 — End: 2015-08-20
  Administered 2015-08-20: 13 mL

## 2015-08-20 MED ORDER — CEFAZOLIN SODIUM-DEXTROSE 2-3 GM-% IV SOLR
INTRAVENOUS | Status: AC
  Filled 2015-08-20: qty 50

## 2015-08-20 MED ORDER — FENTANYL CITRATE (PF) 100 MCG/2ML IJ SOLN
INTRAMUSCULAR | Status: AC
  Filled 2015-08-20: qty 4

## 2015-08-20 MED ORDER — ACETAMINOPHEN 325 MG PO TABS
325.0000 mg | ORAL_TABLET | ORAL | Status: DC | PRN
  Filled 2015-08-20: qty 2

## 2015-08-20 MED ORDER — FENTANYL CITRATE (PF) 100 MCG/2ML IJ SOLN
25.0000 ug | INTRAMUSCULAR | Status: DC | PRN
  Filled 2015-08-20: qty 1

## 2015-08-20 MED ORDER — ONDANSETRON HCL 4 MG/2ML IJ SOLN
INTRAMUSCULAR | Status: AC
  Filled 2015-08-20: qty 2

## 2015-08-20 MED ORDER — ACETAMINOPHEN 160 MG/5ML PO SOLN
325.0000 mg | ORAL | Status: DC | PRN
  Filled 2015-08-20: qty 20.3

## 2015-08-20 MED ORDER — FENTANYL CITRATE (PF) 100 MCG/2ML IJ SOLN
INTRAMUSCULAR | Status: DC | PRN
  Administered 2015-08-20: 50 ug via INTRAVENOUS

## 2015-08-20 MED ORDER — CEFAZOLIN SODIUM 1-5 GM-% IV SOLN
1.0000 g | INTRAVENOUS | Status: AC
  Administered 2015-08-20: 2 g via INTRAVENOUS
  Filled 2015-08-20: qty 50

## 2015-08-20 SURGICAL SUPPLY — 30 items
BAG DRAIN URO-CYSTO SKYTR STRL (DRAIN) ×3 IMPLANT
BAG DRN UROCATH (DRAIN) ×1
BASKET DAKOTA 1.9FR 11X120 (BASKET) ×2 IMPLANT
BASKET LASER NITINOL 1.9FR (BASKET) IMPLANT
BASKET ZERO TIP NITINOL 2.4FR (BASKET) IMPLANT
BSKT STON RTRVL 120 1.9FR (BASKET)
BSKT STON RTRVL ZERO TP 2.4FR (BASKET)
CANISTER SUCT LVC 12 LTR MEDI- (MISCELLANEOUS) IMPLANT
CATH INTERMIT  6FR 70CM (CATHETERS) ×3 IMPLANT
CLOTH BEACON ORANGE TIMEOUT ST (SAFETY) ×3 IMPLANT
FIBER LASER FLEXIVA 365 (UROLOGICAL SUPPLIES) IMPLANT
FIBER LASER TRAC TIP (UROLOGICAL SUPPLIES) IMPLANT
GLOVE BIO SURGEON STRL SZ8 (GLOVE) ×3 IMPLANT
GOWN STRL REUS W/ TWL LRG LVL3 (GOWN DISPOSABLE) ×1 IMPLANT
GOWN STRL REUS W/ TWL XL LVL3 (GOWN DISPOSABLE) ×1 IMPLANT
GOWN STRL REUS W/TWL LRG LVL3 (GOWN DISPOSABLE) ×3
GOWN STRL REUS W/TWL XL LVL3 (GOWN DISPOSABLE) ×3
GUIDEWIRE ANG ZIPWIRE 038X150 (WIRE) ×3 IMPLANT
GUIDEWIRE STR DUAL SENSOR (WIRE) ×2 IMPLANT
IV NS IRRIG 3000ML ARTHROMATIC (IV SOLUTION) ×3 IMPLANT
KIT ROOM TURNOVER WOR (KITS) ×3 IMPLANT
MANIFOLD NEPTUNE II (INSTRUMENTS) ×2 IMPLANT
PACK CYSTO (CUSTOM PROCEDURE TRAY) ×3 IMPLANT
PACKING VAGINAL (PACKING) ×2 IMPLANT
SHEATH ACCESS URETERAL 38CM (SHEATH) ×2 IMPLANT
STENT URET 6FRX26 CONTOUR (STENTS) ×4 IMPLANT
SYRINGE 10CC LL (SYRINGE) ×3 IMPLANT
TUBE CONNECTING 12'X1/4 (SUCTIONS) ×1
TUBE CONNECTING 12X1/4 (SUCTIONS) ×1 IMPLANT
TUBE FEEDING 8FR 16IN STR KANG (MISCELLANEOUS) IMPLANT

## 2015-08-20 NOTE — Anesthesia Preprocedure Evaluation (Addendum)
Anesthesia Evaluation    Airway Mallampati: II  TM Distance: >3 FB     Dental  (+) Partial Lower, Upper Dentures, Dental Advisory Given,    Pulmonary former smoker,    breath sounds clear to auscultation       Cardiovascular Exercise Tolerance: Poor hypertension, Pt. on medications  Rhythm:Regular Rate:Normal     Neuro/Psych Anxiety    GI/Hepatic   Endo/Other  diabetes, Well Controlled, Type 2, Oral Hypoglycemic Agents  Renal/GU Renal calculi     Musculoskeletal  (+) Arthritis , Osteoarthritis,    Abdominal   Peds  Hematology   Anesthesia Other Findings   Reproductive/Obstetrics                       Anesthesia Physical Anesthesia Plan  ASA: III  Anesthesia Plan: General   Post-op Pain Management:    Induction: Intravenous  Airway Management Planned: LMA  Additional Equipment:   Intra-op Plan:   Post-operative Plan: Extubation in OR  Informed Consent: I have reviewed the patients History and Physical, chart, labs and discussed the procedure including the risks, benefits and alternatives for the proposed anesthesia with the patient or authorized representative who has indicated his/her understanding and acceptance.   Dental advisory given  Plan Discussed with: CRNA  Anesthesia Plan Comments:         Anesthesia Quick Evaluation

## 2015-08-20 NOTE — Anesthesia Procedure Notes (Signed)
Procedure Name: LMA Insertion Date/Time: 08/20/2015 3:12 PM Performed by: Wanita Chamberlain Pre-anesthesia Checklist: Patient identified, Timeout performed, Emergency Drugs available, Suction available and Patient being monitored Patient Re-evaluated:Patient Re-evaluated prior to inductionOxygen Delivery Method: Circle system utilized Preoxygenation: Pre-oxygenation with 100% oxygen Intubation Type: IV induction Ventilation: Mask ventilation without difficulty LMA: LMA inserted LMA Size: 4.0 Number of attempts: 1 Airway Equipment and Method: Bite block Placement Confirmation: positive ETCO2 and breath sounds checked- equal and bilateral Tube secured with: Tape Dental Injury: Teeth and Oropharynx as per pre-operative assessment

## 2015-08-20 NOTE — Transfer of Care (Signed)
Immediate Anesthesia Transfer of Care Note  Patient: Cheryl Ortiz  Procedure(s) Performed: Procedure(s): CYSTOSCOPY WITH RETROGRADE PYELOGRAM, URETEROSCOPY AND STENT PLACEMENT WITH STONE EXTRACTION WITH BASKET (Bilateral) HOLMIUM LASER APPLICATION (Bilateral)  Patient Location: PACU  Anesthesia Type:General  Level of Consciousness: awake, alert , oriented and patient cooperative  Airway & Oxygen Therapy: Patient Spontanous Breathing and Patient connected to nasal cannula oxygen  Post-op Assessment: Report given to RN and Post -op Vital signs reviewed and stable  Post vital signs: Reviewed and stable  Last Vitals:  Filed Vitals:   08/20/15 1339  BP: 123/72  Pulse: 75  Temp: 36.5 C  Resp: 20    Complications: No apparent anesthesia complications

## 2015-08-20 NOTE — Anesthesia Postprocedure Evaluation (Signed)
Anesthesia Post Note  Patient: Cheryl Ortiz  Procedure(s) Performed: Procedure(s) (LRB): CYSTOSCOPY WITH RETROGRADE PYELOGRAM, URETEROSCOPY AND STENT PLACEMENT WITH STONE EXTRACTION WITH BASKET (Bilateral) HOLMIUM LASER APPLICATION (Bilateral)  Patient location during evaluation: PACU Anesthesia Type: General Level of consciousness: sedated and patient cooperative Pain management: pain level controlled Vital Signs Assessment: post-procedure vital signs reviewed and stable Respiratory status: spontaneous breathing Cardiovascular status: stable Anesthetic complications: no    Last Vitals:  Filed Vitals:   08/20/15 1630 08/20/15 1645  BP: 144/77 127/72  Pulse: 65 68  Temp:    Resp: 12 17    Last Pain: There were no vitals filed for this visit.               Nolon Nations

## 2015-08-20 NOTE — Brief Op Note (Signed)
08/20/2015  3:56 PM  PATIENT:  Cheryl Ortiz  65 y.o. female  PRE-OPERATIVE DIAGNOSIS:  BILATERAL RENAL CALCULI  POST-OPERATIVE DIAGNOSIS:  BILATERAL RENAL CALCULI  PROCEDURE:  Procedure(s): CYSTOSCOPY WITH RETROGRADE PYELOGRAM, URETEROSCOPY AND STENT PLACEMENT WITH STONE EXTRACTION WITH BASKET (Bilateral) HOLMIUM LASER APPLICATION (Bilateral)  SURGEON:  Surgeon(s) and Role:    * Cleon Gustin, MD - Primary  PHYSICIAN ASSISTANT:   ASSISTANTS: none   ANESTHESIA:   general  EBL:  Total I/O In: 500 [I.V.:500] Out: -   BLOOD ADMINISTERED:none  DRAINS: bilateral 6x26 JJ stent with tether  LOCAL MEDICATIONS USED:  NONE  SPECIMEN:  Source of Specimen:  bilateral renal calculi  DISPOSITION OF SPECIMEN:  N/A  COUNTS:  YES  TOURNIQUET:  * No tourniquets in log *  DICTATION: .Note written in EPIC  PLAN OF CARE: Discharge to home after PACU  PATIENT DISPOSITION:  PACU - hemodynamically stable.   Delay start of Pharmacological VTE agent (>24hrs) due to surgical blood loss or risk of bleeding: not applicable

## 2015-08-20 NOTE — H&P (Signed)
Urology Admission H&P  Chief Complaint: Bilateral renal calculi  History of Present Illness: Cheryl Ortiz is a 65yo with a hx of microhematuria who was found to have bilateral renal calculi on CT scan. She denies any flank pain. She denies LUTS  Past Medical History  Diagnosis Date  . Type 2 diabetes mellitus ([place])   . Hyperlipidemia   . Seasonal allergies   . Osteoarthritis   . Hypertension   . Cold   . 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 Cystocele 07/12/2015  . Renal disorder     kidney stones  . 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 Vaginal hysterectomy  1984  . Cholecystectomy  1999  . Foot surgery Bilateral   . Colonoscopy N/A 06/15/2014    Procedure: COLONOSCOPY;  Surgeon: Rogene Houston, MD;  [facility]: [facility];  Service: Endoscopy;  Laterality: N/A;  1145  . Esophagogastroduodenoscopy N/A 03/23/2015    Procedure: ESOPHAGOGASTRODUODENOSCOPY (EGD);  Surgeon: Rogene Houston, MD;  Location: [facility];  Service: Endoscopy;  Laterality: N/A;  8:55 - moved to 12:35 - Ann to notify pt  . Esophageal dilation N/A 03/23/2015    Procedure: ESOPHAGEAL DILATION;  Surgeon: Rogene Houston, MD;  Location: [facility];  Service: Endoscopy;  Laterality: N/A;    Home Medications:  Prescriptions prior to admission  Medication Sig Dispense Refill Last Dose  . Albuterol (PROVENTIL IN) Inhale 2 Inhalers into the lungs 2 (two) times daily.   Past Week at Unknown time  . aspirin 81 MG tablet Take 81 mg by mouth daily.   Past Week at Unknown time  . famotidine (PEPCID) 20 MG tablet Take 20 mg by mouth 2 (two) times daily.   Past Week at Unknown time  . hydrochlorothiazide (HYDRODIURIL) 25 MG tablet Take 25 mg by mouth daily.   08/19/2015 at 0830  . lovastatin (MEVACOR) 20 MG tablet Take 20 mg by mouth at bedtime.   08/19/2015 at 1700  . metFORMIN  (GLUCOPHAGE) 500 MG tablet Take 500 mg by mouth 2 (two) times daily with a meal.   08/19/2015 at 1700  . OVER THE COUNTER MEDICATION Take 1 tablet by mouth daily. HERBAL SUPPLEMENT: Iron, Calcium, Vitamin C and E (for menstruation and menopause)   Past Week at Unknown time  . vitamin C (ASCORBIC ACID) 500 MG tablet Take 500 mg by mouth daily.   Past Week at Unknown time  . cephALEXin (KEFLEX) 500 MG capsule Take 1 capsule (500 mg total) by mouth 2 (two) times daily. 14 capsule 0   . dextromethorphan-guaiFENesin (MUCINEX DM) 30-600 MG per 12 hr tablet Take 1 tablet by mouth 2 (two) times daily as needed for cough. 20 tablet 0 Taking  . methocarbamol (ROBAXIN) 500 MG tablet Take 500 mg by mouth as needed for muscle spasms.   Unknown at Unknown time   Allergies:  Allergies  Allergen Reactions  . Codeine Other (See Comments)    Sensitivity, "makes high"  . Prednisone Other (See Comments)    High blood sugar.    Family History  Problem Relation Age of Onset  . Hypertension Mother   . Arthritis Mother   . Cancer Mother     breast   . Cerebral palsy Brother   . Early death Brother   . Hypertension Brother   .  Heart disease Brother   . Pneumonia Father   . Hypertension Father   . Hyperlipidemia Sister   . Fibroids Sister   . Hypertension Brother    Social History:  reports that she quit smoking about 18 years ago. Her smoking use included Cigarettes. She has a 6 pack-year smoking history. She has never used smokeless tobacco. She reports that she does not drink alcohol or use illicit drugs.  Review of Systems  Genitourinary: Positive for flank pain.  All other systems reviewed and are negative.   Physical Exam:  Vital signs in last 24 hours: Temp:  [97.7 F (36.5 C)] 97.7 F (36.5 C) (04/03 1339) Pulse Rate:  [75] 75 (04/03 1339) Resp:  [20] 20 (04/03 1339) BP: (123)/(72) 123/72 mmHg (04/03 1339) SpO2:  [99 %] 99 % (04/03 1339) Weight:  [111.585 kg (246 lb)] 111.585 kg (246 lb)  (04/03 1339) Physical Exam  Constitutional: She is oriented to person, place, and time. She appears well-developed and well-nourished.  HENT:  Head: Normocephalic and atraumatic.  Eyes: EOM are normal. Pupils are equal, round, and reactive to light.  Neck: Normal range of motion. No thyromegaly present.  Cardiovascular: Normal rate and regular rhythm.   Respiratory: Effort normal. No respiratory distress.  GI: Soft. She exhibits no distension and no mass. There is no tenderness. There is no rebound and no guarding.  Musculoskeletal: Normal range of motion.  Neurological: She is alert and oriented to person, place, and time.  Skin: Skin is warm and dry.  Psychiatric: She has a normal mood and affect. Her behavior is normal. Judgment and thought content normal.    Laboratory Data:  Results for orders placed or performed during the hospital encounter of 08/20/15 (from the past 24 hour(s))  I-STAT, chem 8     Status: Abnormal   Collection Time: 08/20/15  2:22 PM  Result Value Ref Range   Sodium 142 135 - 145 mmol/L   Potassium 3.7 3.5 - 5.1 mmol/L   Chloride 101 101 - 111 mmol/L   BUN 20 6 - 20 mg/dL   Creatinine, Ser 0.80 0.44 - 1.00 mg/dL   Glucose, Bld 119 (H) 65 - 99 mg/dL   Calcium, Ion 1.23 1.13 - 1.30 mmol/L   TCO2 26 0 - 100 mmol/L   Hemoglobin 20.1 (H) 12.0 - 15.0 g/dL   HCT 59.0 (H) 36.0 - 46.0 %   No results found for this or any previous visit (from the past 240 hour(s)). Creatinine:  Recent Labs  08/20/15 1422  CREATININE 0.80   Baseline Creatinine: 0.8  Impression/Assessment:  65yo with bilateral renal calculi  Plan:  The risks/benefits/alternatives to bilateral ureteroscopic stone extraction was explained to the patient and she understands and wishes to proceed with surgery  Nicolette Bang 08/20/2015, 2:37 PM

## 2015-08-20 NOTE — Discharge Instructions (Signed)
Ureteroscopy, Care After Refer to this sheet in the next few weeks. These instructions provide you with information on caring for yourself after your procedure. Your health care provider may also give you more specific instructions. Your treatment has been planned according to current medical practices, but problems sometimes occur. Call your health care provider if you have any problems or questions after your procedure.  WHAT TO EXPECT AFTER THE PROCEDURE  After your procedure, it is typical to have the following:  1. A burning sensation when you urinate. 2. Blood in your urine. HOME CARE INSTRUCTIONS   Only take medicines as directed by your health care provider. Do not take any over-the-counter pain medication unless your health care provider says it is okay.  Take a warm bath or hold a warm washcloth over your groin to relieve burning.  Drink enough fluids to keep your urine clear or pale yellow.  Drink two 8-ounce glasses of water every hour for the first 2 hours after you get home.  Continue to drink water often at home.  You can eat what you usually do.  Ask your surgeon when you can do your usual activities.  If you had a tube placed to keep urine flowing (ureteral stent), ask your health care provider when you need to return to have it removed.  Keep all follow-up appointments. SEEK MEDICAL CARE IF:   You have chills or fever.  You have burning pain for longer than 24 hours after the procedure.  You have blood in your urine for longer than 24 hours after the procedure. SEEK IMMEDIATE MEDICAL CARE IF:   You have large amounts of blood or clots in your urine.  You have very bad pain.  You have chest pain or trouble breathing.   This information is not intended to replace advice given to you by your health care provider. Make sure you discuss any questions you have with your health care provider.   Document Released: 05/10/2013 Document Reviewed: 05/10/2013 Elsevier  Interactive Patient Education 2016 [facility] [phone] Post Ureteroscopy With or Without Stent Instructions  Definitions:  Ureter: The duct that transports urine from the kidney to the bladder. Stent:   A plastic hollow tube that is placed into the ureter, from the kidney to the bladder to prevent the ureter from swelling shut.  GENERAL INSTRUCTIONS:  Despite the fact that no skin incisions were used, the area around the ureter and bladder is raw and irritated. The stent is a foreign body which will further irritate the bladder wall. This irritation is manifested by increased frequency of urination, both day and night, and by an increase in the urge to urinate. In some, the urge to urinate is present almost always. Sometimes the urge is strong enough that you may not be able to stop yourself from urinating. The only real cure is to remove the stent and then give time for the bladder wall to heal which can't be done until the danger of the ureter swelling shut has passed, which varies.  You may see some blood in your urine while the stent is in place and a few days afterwards. Do not be alarmed, even if the urine was clear for a while. Get off your feet and drink lots of fluids until clearing occurs. If you start to pass clots or don't improve, call us.  DIET: You may return to your normal diet immediately. Because of the raw surface of your  bladder, alcohol, spicy foods, acid type foods and drinks with caffeine may cause irritation or frequency and should be used in moderation. To keep your urine flowing freely and to avoid constipation, drink plenty of fluids during the day ( 8-10 glasses ). Tip: Avoid cranberry juice because it is very acidic.  ACTIVITY: Your physical activity doesn't need to be restricted. However, if you are very active, you may see some blood in your urine. We suggest that you reduce your activity under these circumstances until  the bleeding has stopped.  BOWELS: It is important to keep your bowels regular during the postoperative period. Straining with bowel movements can cause bleeding. A bowel movement every other day is reasonable. Use a mild laxative if needed, such as Milk of Magnesia 2-3 tablespoons, or 2 Dulcolax tablets. Call if you continue to have problems. If you have been taking narcotics for pain, before, during or after your surgery, you may be constipated. Take a laxative if necessary.   MEDICATION: You should resume your pre-surgery medications unless told not to. In addition you will often be given an antibiotic to prevent infection. These should be taken as prescribed until the bottles are finished unless you are having an unusual reaction to one of the drugs.  PROBLEMS YOU SHOULD REPORT TO [place]:  Fevers over 100.5 Fahrenheit.  Heavy bleeding, or clots ( See above notes about blood in urine ).  Inability to urinate.  Drug reactions ( hives, rash, nausea, vomiting, diarrhea ).  Severe burning or pain with urination that is not improving.  FOLLOW-UP: You will need a follow-up appointment to monitor your progress. Call for this appointment at the number listed above. Usually the first appointment will be about three to fourteen days after your surgery.      Post Anesthesia Home Care Instructions  Activity: Get plenty of rest for the remainder of the day. A responsible adult should stay with you for 24 hours following the procedure.  For the next 24 hours, DO NOT: -Drive a car -Paediatric nurse -Drink alcoholic beverages -Take any medication unless instructed by your physician -Make any legal decisions or sign important papers.  Meals: Start with liquid foods such as gelatin or soup. Progress to regular foods as tolerated. Avoid greasy, spicy, heavy foods. If nausea and/or vomiting occur, drink only clear liquids until the nausea and/or vomiting subsides. Call your physician if vomiting  continues.  Special Instructions/Symptoms: Your throat may feel dry or sore from the anesthesia or the breathing tube placed in your throat during surgery. If this causes discomfort, gargle with warm salt water. The discomfort should disappear within 24 hours.  If you had a scopolamine patch placed behind your ear for the management of post- operative nausea and/or vomiting:  1. The medication in the patch is effective for 72 hours, after which it should be removed.  Wrap patch in a tissue and discard in the trash. Wash hands thoroughly with soap and water. 2. You may remove the patch earlier than 72 hours if you experience unpleasant side effects which may include dry mouth, dizziness or visual disturbances. 3. Avoid touching the patch. Wash your hands with soap and water after contact with the patch.

## 2015-08-21 ENCOUNTER — Encounter (HOSPITAL_BASED_OUTPATIENT_CLINIC_OR_DEPARTMENT_OTHER): Payer: Self-pay | Admitting: Urology

## 2015-08-23 ENCOUNTER — Telehealth: Payer: Self-pay | Admitting: Neurology

## 2015-08-23 ENCOUNTER — Encounter

## 2015-08-23 ENCOUNTER — Encounter: Admitting: Neurology

## 2015-08-23 NOTE — Op Note (Signed)
Marland KitchenPreoperative diagnosis: bilateral renal calculi  Postoperative diagnosis: Same  Procedure: 1 cystoscopy 2. bilateralretrograde pyelography 3.  Intraoperative fluoroscopy, under one hour, with interpretation 4.  Bilateral ureteroscopic stone manipulation with basket extraction 5.  bilateral 6 x 26 JJ stent exchange  Attending: Rosie Fate  Anesthesia: General  Estimated blood loss: None  Drains: bilateral 6 x 26 JJ ureteral stent with tether  Specimens: stone for analysis  Antibiotics: Ancef  Findings: bilateral mid and lower pole renal calculi. No hydronephrosis. No masses/lesions in the bladder. Ureteral orifices in normal anatomic location.  Indications: Patient is a 65 year old female with a history of bilateral renal calculi and bilateral flank pain. After discussing treatment options, they decided proceed with bilateral ureteroscopic stone manipulation.  Procedure her in detail: The patient was brought to the operating room and a brief timeout was done to ensure correct patient, correct procedure, correct site.  General anesthesia was administered patient was placed in dorsal lithotomy position.  Her genitalia was then prepped and draped in usual sterile fashion.  A rigid 75 French cystoscope was passed in the urethra and the bladder.  Bladder was inspected free masses or lesions.  the ureteral orifices were in the normal orthotopic locations. a 6 french ureteral catheter was then instilled into the left ureteral orifice.  a gentle retrograde was obtained and findings noted above. We then advanced a zipwire through the catheter and up to the renal pelvis.  we then removed the cystoscope and cannulated the left ureteral orifice with a semirigid ureteroscope.  We located no stone in the ureter. We then placed a sensor wire up to the renal pelvis. We removed the scope and advanced a 12/14 x 38cm access sheath up to the renal pelvis. We then used the flexible ureteroscope to perform  nephroscopy. We located calculi int he mid and lower poles which were removed with an NGage basket. Once the stone were removed we then removed the access sheath under direct vision and noted to injury to the ureter.  We then placed a 6 x 26 double-j ureteral stent over the original zip wire. We then removed the wire and good coil was noted in the the renal pelvis under fluoroscopy and the bladder under direct vision.  We then turned out attention to the right side. We then advanced a zipwire through the catheter and up to the renal pelvis.  we then removed the cystoscope and cannulated the left ureteral orifice with a semirigid ureteroscope.  We located no stone in the ureter. We then placed a sensor wire up to the renal pelvis. We removed the scope and advanced a 12/14 x 38cm access sheath up to the renal pelvis. We then used the flexible ureteroscope to perform nephroscopy. We located calculi int he mid and lower poles which were removed with an NGage basket. Once the stone were removed we then removed the access sheath under direct vision and noted to injury to the ureter. we then placed a 6 x 26 double-j ureteral stent over the original zip wire.  We then removed the wire and good coil was noted in the the renal pelvis under fluoroscopy and the bladder under direct vision.   the bladder was then drained and this concluded the procedure which was well tolerated by patient.  Complications: None  Condition: Stable, extubated, transferred to PACU  Plan: Patient is to be discharged home as to follow-up in 2 weeks. She is to remove her stents by pulling the tehter in 72 hours

## 2015-08-23 NOTE — Telephone Encounter (Signed)
The patient canceled EMG appointment secondary to illness today.

## 2015-08-28 ENCOUNTER — Ambulatory Visit (INDEPENDENT_AMBULATORY_CARE_PROVIDER_SITE_OTHER): Payer: Self-pay | Admitting: Neurology

## 2015-08-28 ENCOUNTER — Ambulatory Visit (INDEPENDENT_AMBULATORY_CARE_PROVIDER_SITE_OTHER): Admitting: Neurology

## 2015-08-28 ENCOUNTER — Encounter: Payer: Self-pay | Admitting: Neurology

## 2015-08-28 DIAGNOSIS — M79601 Pain in right arm: Secondary | ICD-10-CM

## 2015-08-28 DIAGNOSIS — I10 Essential (primary) hypertension: Secondary | ICD-10-CM

## 2015-08-28 DIAGNOSIS — M79602 Pain in left arm: Secondary | ICD-10-CM

## 2015-08-28 NOTE — Procedures (Signed)
     HISTORY:  Cheryl Ortiz is a 65 year old patient with a one-year history of pain and discomfort beginning in the right hand and eventually going up to the right shoulder associated with some numbness and weakness. The patient denies neck discomfort. She has started having similar discomfort in the base of the thumb on the left hand. She is being evaluated for a possible neuropathy or a cervical radiculopathy.  NERVE CONDUCTION STUDIES:  Nerve conduction studies were performed on both upper extremities. The distal motor latencies and motor amplitudes for the median and ulnar nerves were within normal limits. The F wave latencies and nerve conduction velocities for these nerves were also normal. The sensory latencies for the median and ulnar nerves were normal.   EMG STUDIES:  EMG study was performed on the right upper extremity:  The first dorsal interosseous muscle reveals 2 to 4 K units with full recruitment. No fibrillations or positive waves were noted. The abductor pollicis brevis muscle reveals 2 to 4 K units with full recruitment. No fibrillations or positive waves were noted. The extensor indicis proprius muscle reveals 1 to 3 K units with full recruitment. No fibrillations or positive waves were noted. The pronator teres muscle reveals 2 to 3 K units with full recruitment. No fibrillations or positive waves were noted. The biceps muscle reveals 1 to 2 K units with full recruitment. No fibrillations or positive waves were noted. The triceps muscle reveals 2 to 4 K units with full recruitment. No fibrillations or positive waves were noted. The anterior deltoid muscle reveals 2 to 3 K units with full recruitment. No fibrillations or positive waves were noted. The cervical paraspinal muscles were tested at 2 levels. One run of complex repetitive discharges were seen in the lower level. No fibrillations or positive waves were seen. There was good relaxation.  EMG study was  performed on the left upper extremity:  The first dorsal interosseous muscle reveals 2 to 4 K units with full recruitment. No fibrillations or positive waves were noted. The abductor pollicis brevis muscle reveals 2 to 4 K units with full recruitment. No fibrillations or positive waves were noted. The extensor indicis proprius muscle reveals 1 to 3 K units with full recruitment. No fibrillations or positive waves were noted. The pronator teres muscle reveals 2 to 3 K units with full recruitment. No fibrillations or positive waves were noted. The biceps muscle reveals 1 to 2 K units with full recruitment. No fibrillations or positive waves were noted. The triceps muscle reveals 2 to 4 K units with full recruitment. No fibrillations or positive waves were noted. The anterior deltoid muscle reveals 2 to 3 K units with full recruitment. No fibrillations or positive waves were noted. The cervical paraspinal muscles were tested at 2 levels. No abnormalities of insertional activity were seen at either level tested. There was good relaxation.   IMPRESSION:  Nerve conduction studies done on both upper extremities were within normal limits. No evidence of carpal tunnel syndrome is seen on either side. EMG evaluation of both upper extremities are unremarkable, without evidence of an overlying cervical radiculopathy.  Jill Alexanders MD 08/28/2015 10:57 AM  [facility] [address] [place], [place] [address]  Phone [phone] Fax [phone]

## 2015-08-28 NOTE — Progress Notes (Signed)
Please refer to EMG and nerve conduction study procedure note. 

## 2015-11-29 ENCOUNTER — Other Ambulatory Visit (HOSPITAL_COMMUNITY): Payer: Self-pay | Admitting: Family Medicine

## 2015-11-29 DIAGNOSIS — Z1231 Encounter for screening mammogram for malignant neoplasm of breast: Secondary | ICD-10-CM

## 2015-12-03 ENCOUNTER — Ambulatory Visit (HOSPITAL_COMMUNITY)
Admission: RE | Admit: 2015-12-03 | Discharge: 2015-12-03 | Disposition: A | Discharge status: Home / Self Care | Source: Ambulatory Visit | Attending: Family Medicine | Admitting: Family Medicine

## 2015-12-03 DIAGNOSIS — Z1231 Encounter for screening mammogram for malignant neoplasm of breast: Secondary | ICD-10-CM | POA: Diagnosis not present

## 2016-04-08 ENCOUNTER — Encounter (INDEPENDENT_AMBULATORY_CARE_PROVIDER_SITE_OTHER): Payer: Self-pay | Admitting: *Deleted

## 2016-04-08 ENCOUNTER — Encounter (INDEPENDENT_AMBULATORY_CARE_PROVIDER_SITE_OTHER): Payer: Self-pay | Admitting: Internal Medicine

## 2016-04-08 ENCOUNTER — Ambulatory Visit (INDEPENDENT_AMBULATORY_CARE_PROVIDER_SITE_OTHER): Admitting: Internal Medicine

## 2016-04-08 VITALS — BP 126/80 | HR 72 | Temp 97.7°F | Ht 66.0 in | Wt 231.5 lb

## 2016-04-08 DIAGNOSIS — R1032 Left lower quadrant pain: Secondary | ICD-10-CM | POA: Diagnosis not present

## 2016-04-08 DIAGNOSIS — K6289 Other specified diseases of anus and rectum: Secondary | ICD-10-CM | POA: Diagnosis not present

## 2016-04-08 MED ORDER — HYDROCORTISONE ACE-PRAMOXINE 1-1 % RE FOAM: 1.0000 | Freq: Two times a day (BID) | RECTAL | 0 refills | Status: DC

## 2016-04-08 NOTE — Progress Notes (Signed)
Subjective:    Patient ID: Cheryl Ortiz, female    DOB: 21-Nov-1950, 65 y.o.   MRN: [id number]  HPI She tells me when she sits and gets up, she will have rectal pain. She says sometimes it feels like she is sitting a brick wall. She says the pain radiates up. Symptoms for a couple of weeks. Her stools are soft. She has not been constipated. She is having 2-3 BMs a day. She take Doculax or stool softener as needed. She started fruits and vegetables and her BMs became better. Lower groin tenderness. No fever. No fever. 06/16/2015 Colonoscopy: Hx of colonic adenomas on prior colonoscopies in 2004 and 2010.  Impression:  Examination performed to cecum. Single small cecal erosion felt to be secondary to NSAID use. Few scattered diverticula throughout the colon. Small polyp at sigmoid colon ablated via cold biopsy and residual polyp coagulated dyspnea tip. Two small polyps hot snared from distal rectum and submitted together. Three small rectal polyps were coagulated using snare tip.  This time patient had hyperplastic polyps. On prior colonoscopy she had adenomas. Next colonoscopy in 7 years unless she has symptoms.   Review of Systems Past Medical History:  Diagnosis Date  . 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 Cold   . Cystocele 07/12/2015  . Hyperlipidemia   . Hypertension   . Neuropathy of both feet    tingling/numbness bilateral feet ,rt arm,hands-nerve study scheduled  . Osteoarthritis   . Renal disorder    kidney stones  . Seasonal allergies   . Type 2 diabetes mellitus ([place])     Past Surgical History:  Procedure Laterality Date  . CHOLECYSTECTOMY  1999  . COLONOSCOPY N/A 06/15/2014   Procedure: COLONOSCOPY;  Surgeon: Rogene Houston, MD;  [facility];  Service: Endoscopy;  Laterality: N/A;  1145  . CYSTOSCOPY WITH RETROGRADE PYELOGRAM, URETEROSCOPY AND STENT PLACEMENT Bilateral 08/20/2015   Procedure: CYSTOSCOPY WITH RETROGRADE  PYELOGRAM, URETEROSCOPY AND STENT PLACEMENT WITH STONE EXTRACTION WITH BASKET;  Surgeon: Cleon Gustin, MD;  Location[facility];  Service: Urology;  Laterality: Bilateral;  . ESOPHAGEAL DILATION N/A 03/23/2015   Procedure: ESOPHAGEAL DILATION;  Surgeon: Rogene Houston, MD;  [facility];  Service: Endoscopy;  Laterality: N/A;  . ESOPHAGOGASTRODUODENOSCOPY N/A 03/23/2015   Procedure: ESOPHAGOGASTRODUODENOSCOPY (EGD);  Surgeon: Rogene Houston, MD;  [facility];  Service: Endoscopy;  Laterality: N/A;  8:55 - moved to 12:35 - Ann to notify pt  . FOOT SURGERY Bilateral   . HOLMIUM LASER APPLICATION Bilateral [id number]   Procedure: HOLMIUM LASER APPLICATION;  Surgeon: Cleon Gustin, MD;  Location: [facility];  Service: Urology;  Laterality: Bilateral;  . VAGINAL HYSTERECTOMY  1984    Allergies  Allergen Reactions  . Codeine Other (See Comments)    Sensitivity, "makes high"  . Prednisone Other (See Comments)    High blood sugar.    Current Outpatient Prescriptions on File Prior to Visit  Medication Sig Dispense Refill  . Albuterol (PROVENTIL IN) Inhale 2 Inhalers into the lungs 2 (two) times daily.    Marland Kitchen aspirin 81 MG tablet Take 81 mg by mouth daily.    . famotidine (PEPCID) 20 MG tablet Take 20 mg by mouth 2 (two) times daily.    . hydrochlorothiazide (HYDRODIURIL) 25 MG tablet Take 25 mg by mouth daily.    Marland Kitchen lovastatin (MEVACOR) 20 MG tablet Take 20 mg by  mouth at bedtime.    . metFORMIN (GLUCOPHAGE) 500 MG tablet Take 500 mg by mouth 2 (two) times daily with a meal.    . methocarbamol (ROBAXIN) 500 MG tablet Take 500 mg by mouth as needed for muscle spasms.    Marland Kitchen OVER THE COUNTER MEDICATION Take 1 tablet by mouth daily. HERBAL SUPPLEMENT: Iron, Calcium, Vitamin C and E (for menstruation and menopause)    . vitamin C (ASCORBIC ACID) 500 MG tablet Take 500 mg by mouth daily.     No current facility-administered medications on  file prior to visit.        Objective:   Physical Exam Blood pressure 126/80, pulse 72, temperature 97.7 F (36.5 C), height 5\' 6"  (1.676 m), weight 231 lb 8 oz (105 kg). Alert and oriented. Skin warm and dry. Oral mucosa is moist.   . Sclera anicteric, conjunctivae is pink. Thyroid not enlarged. No cervical lymphadenopathy. Lungs clear. Heart regular rate and rhythm.  Abdomen is soft. Bowel sounds are positive. No hepatomegaly. No abdominal masses felt. Slight tenderness to left groin  No edema to lower extremities.          Assessment & Plan:  Rectal pain. LLQ. Am going to rule of diverticulitis.  CT abdomen/pelvis with DM. Rx for Proctofoam sent to her pharmacy.

## 2016-04-08 NOTE — Patient Instructions (Addendum)
Rx for proctofoam CT abdomen/pelvis with CM

## 2016-04-10 LAB — CREATININE, SERUM: Creat: 1.07 mg/dL — ABNORMAL HIGH (ref 0.50–0.99)

## 2016-04-16 ENCOUNTER — Ambulatory Visit (HOSPITAL_COMMUNITY)
Admission: RE | Admit: 2016-04-16 | Discharge: 2016-04-16 | Disposition: A | Discharge status: Home / Self Care | Source: Ambulatory Visit | Attending: Internal Medicine | Admitting: Internal Medicine

## 2016-04-16 DIAGNOSIS — K5730 Diverticulosis of large intestine without perforation or abscess without bleeding: Secondary | ICD-10-CM | POA: Insufficient documentation

## 2016-04-16 DIAGNOSIS — K6289 Other specified diseases of anus and rectum: Secondary | ICD-10-CM | POA: Diagnosis present

## 2016-04-16 DIAGNOSIS — R1032 Left lower quadrant pain: Secondary | ICD-10-CM | POA: Diagnosis present

## 2016-04-16 DIAGNOSIS — N8110 Cystocele, unspecified: Secondary | ICD-10-CM | POA: Insufficient documentation

## 2016-04-16 DIAGNOSIS — K760 Fatty (change of) liver, not elsewhere classified: Secondary | ICD-10-CM | POA: Insufficient documentation

## 2016-04-16 MED ORDER — IOPAMIDOL (ISOVUE-M 300) INJECTION 61%: 15.0000 mL | Freq: Once | INTRAMUSCULAR | Status: DC | PRN

## 2016-04-16 MED ORDER — IOPAMIDOL (ISOVUE-300) INJECTION 61%
100.0000 mL | Freq: Once | INTRAVENOUS | Status: AC | PRN
  Administered 2016-04-16: 100 mL via INTRAVENOUS

## 2016-04-23 ENCOUNTER — Other Ambulatory Visit (HOSPITAL_COMMUNITY): Payer: Self-pay | Admitting: Family Medicine

## 2016-04-24 ENCOUNTER — Other Ambulatory Visit (HOSPITAL_COMMUNITY): Payer: Self-pay | Admitting: Family Medicine

## 2016-04-25 ENCOUNTER — Other Ambulatory Visit (HOSPITAL_COMMUNITY): Payer: Self-pay | Admitting: Family Medicine

## 2016-04-28 ENCOUNTER — Other Ambulatory Visit (HOSPITAL_COMMUNITY): Payer: Self-pay | Admitting: Family Medicine

## 2016-04-28 DIAGNOSIS — I671 Cerebral aneurysm, nonruptured: Secondary | ICD-10-CM

## 2016-05-06 ENCOUNTER — Ambulatory Visit (HOSPITAL_COMMUNITY)
Admission: RE | Admit: 2016-05-06 | Discharge: 2016-05-06 | Disposition: A | Discharge status: Home / Self Care | Source: Ambulatory Visit | Attending: Family Medicine | Admitting: Family Medicine

## 2016-05-06 DIAGNOSIS — I671 Cerebral aneurysm, nonruptured: Secondary | ICD-10-CM | POA: Insufficient documentation

## 2016-05-28 ENCOUNTER — Other Ambulatory Visit (INDEPENDENT_AMBULATORY_CARE_PROVIDER_SITE_OTHER): Payer: Self-pay | Admitting: Internal Medicine

## 2016-05-28 DIAGNOSIS — K6289 Other specified diseases of anus and rectum: Secondary | ICD-10-CM

## 2016-05-29 ENCOUNTER — Telehealth (INDEPENDENT_AMBULATORY_CARE_PROVIDER_SITE_OTHER): Payer: Self-pay | Admitting: Internal Medicine

## 2016-05-29 DIAGNOSIS — K6289 Other specified diseases of anus and rectum: Secondary | ICD-10-CM

## 2016-05-29 MED ORDER — HYDROCORTISONE ACE-PRAMOXINE 1-1 % RE FOAM: 1.0000 | Freq: Two times a day (BID) | RECTAL | 2 refills | Status: DC

## 2016-05-29 NOTE — Telephone Encounter (Signed)
Rx for proctofoam sent to her pharmacy

## 2016-06-13 ENCOUNTER — Encounter (INDEPENDENT_AMBULATORY_CARE_PROVIDER_SITE_OTHER): Payer: Self-pay | Admitting: Internal Medicine

## 2016-06-13 ENCOUNTER — Telehealth (INDEPENDENT_AMBULATORY_CARE_PROVIDER_SITE_OTHER): Payer: Self-pay | Admitting: Internal Medicine

## 2016-06-13 DIAGNOSIS — K6289 Other specified diseases of anus and rectum: Secondary | ICD-10-CM

## 2016-06-13 MED ORDER — HYDROCORTISONE ACE-PRAMOXINE 1-1 % RE FOAM: 1.0000 | Freq: Two times a day (BID) | RECTAL | 2 refills | Status: DC

## 2016-06-13 NOTE — Telephone Encounter (Signed)
Rx sent to [facility]

## 2016-10-08 ENCOUNTER — Ambulatory Visit (HOSPITAL_COMMUNITY): Attending: Sports Medicine | Admitting: Specialist

## 2016-10-08 ENCOUNTER — Ambulatory Visit (HOSPITAL_COMMUNITY)

## 2016-10-08 ENCOUNTER — Encounter (HOSPITAL_COMMUNITY): Payer: Self-pay

## 2016-10-08 ENCOUNTER — Encounter (HOSPITAL_COMMUNITY): Payer: Self-pay | Admitting: Specialist

## 2016-10-08 DIAGNOSIS — M25511 Pain in right shoulder: Secondary | ICD-10-CM | POA: Insufficient documentation

## 2016-10-08 DIAGNOSIS — M542 Cervicalgia: Secondary | ICD-10-CM | POA: Insufficient documentation

## 2016-10-08 DIAGNOSIS — M25611 Stiffness of right shoulder, not elsewhere classified: Secondary | ICD-10-CM | POA: Diagnosis present

## 2016-10-08 NOTE — Patient Instructions (Signed)
Complete 1-3 minutes each, 2-3 times per day SHOULDER: Flexion On Table   Place hands on table, elbows straight. Move hips away from body. Press hands down into table. Hold ___ seconds. ___ reps per set, ___ sets per day, ___ days per week  Abduction (Passive)   With arm out to side, resting on table, lower head toward arm, keeping trunk away from table. Hold ____ seconds. Repeat ____ times. Do ____ sessions per day.  Copyright  VHI. All rights reserved.     Internal Rotation (Assistive)   Seated with elbow bent at right angle and held against side, slide arm on table surface in an inward arc. Repeat ____ times. Do ____ sessions per day. Activity: Use this motion to brush crumbs off the table.  Copyright  VHI. All rights reserved.   Complete 10 times each 2 times per day AROM: Lateral Neck Flexion   Slowly tilt head toward one shoulder, then the other. Hold each position ____ seconds. Repeat ____ times per set. Do ____ sets per session. Do ____ sessions per day.  http://[email or web address].us/296   Copyright  VHI. All rights reserved.  AROM: Neck Extension   Bend head backward. Hold ____ seconds. Repeat ____ times per set. Do ____ sets per session. Do ____ sessions per day.  http://[email or web address].us/300   Copyright  VHI. All rights reserved.  AROM: Neck Flexion   Bend head forward. Hold ____ seconds. Repeat ____ times per set. Do ____ sets per session. Do ____ sessions per day.  http://[email or web address].us/298   Copyright  VHI. All rights reserved.  AROM: Neck Rotation   Turn head slowly to look over one shoulder, then the other. Hold each position ____ seconds. Repeat ____ times per set. Do ____ sets per session. Do ____ sessions per day.  http://[email or web address].us/294   Copyright  VHI. All rights reserved.  Hold each stretch 10 seconds or longer complete 2 -3 times each 2 times or as needed throughout the day Flexibility: Upper Trapezius Stretch   Gently grasp right side  of head while reaching behind back with other hand. Tilt head away until a gentle stretch is felt. Hold ____ seconds. Repeat ____ times per set. Do ____ sets per session. Do ____ sessions per day.  [email or web address].[email or web address]/340   Levator Stretch   Grasp seat or sit on hand on side to be stretched. Turn head toward other side and look down. Use hand on head to gently stretch neck in that position. Hold ____ seconds. Repeat on other side. Repeat ____ times. Do ____ sessions per day.  [email or web address]   Scapular Retraction (Standing)   With arms at sides, pinch shoulder blades together. Repeat ____ times per set. Do ____ sets per session. Do ____ sessions per day.  [email or web address].[email or web address]/944   Flexibility: Neck Retraction   Pull head straight back, keeping eyes and jaw level. Repeat ____ times per set. Do ____ sets per session. Do ____ sessions per day.  [email or web address].[email or web address]   Posture - Sitting   Sit upright, head facing forward. Try using a roll to support lower back. Keep shoulders relaxed, and avoid rounded back. Keep hips level with knees. Avoid crossing legs for long periods.   Flexibility: Corner Stretch   Standing in corner with hands just above shoulder level and feet ____ inches from corner, lean forward until a comfortable stretch is felt across chest. Hold ____ seconds. Repeat ____ times per set. Do ____ sets per session. Do ____ sessions per day.  http://[email or web address].exer.us/342   Copyright  VHI. All rights  reserved.

## 2016-10-08 NOTE — Therapy (Signed)
[address] [place], [place], [address] Phone: [phone]   Fax:  [phone]  Patient Details  Name: Cheryl Ortiz MRN: [id number] Date of Birth: 1950/09/08 Referring Provider:  Berle Mull, MD  Encounter Date: 10/08/2016   Arrived/cancelled evaluation -- PT order for Lumbar DDD, however, pt verbalized that she did not have any LBP or BLE radicular symptoms. Screened pt and she does not need PT services. No charge for visit.  Geraldine Solar PT, [place] [address] [place], [place], [address] Phone: [phone]   Fax:  [phone]

## 2016-10-08 NOTE — Therapy (Signed)
[place], [place], [address] Phone: [phone]   Fax:  [phone]  Occupational Therapy Evaluation  Patient Details  Name: Cheryl Ortiz MRN: [id number] Date of Birth: 05-Feb-1951 Referring Provider: Dr. Alfonso Ramus  Encounter Date: 10/08/2016      OT End of Session - 10/08/16 1627    Visit Number 1   Number of Visits 16   Date for OT Re-Evaluation 12/07/16  mini reassess on 11/06/16   Authorization Type CHAMPVA   Authorization Time Period before 10th visit   Authorization - Visit Number 1   Authorization - Number of Visits 10   OT Start Time 1450   OT Stop Time 1600   OT Time Calculation (min) 70 min   Activity Tolerance Patient tolerated treatment well   Behavior During Therapy [facility] for tasks assessed/performed      Past Medical History:  Diagnosis Date  . Aneurysm ([place]) 06/2015   brain-small will recheck in 1 year  . Cold   . Cystocele 07/12/2015  . Hyperlipidemia   . Hypertension   . Neuropathy of both feet    tingling/numbness bilateral feet ,rt arm,hands-nerve study scheduled  . Osteoarthritis   . Renal disorder    kidney stones  . Seasonal allergies   . Type 2 diabetes mellitus ([place])     Past Surgical History:  Procedure Laterality Date  . CHOLECYSTECTOMY  1999  . COLONOSCOPY N/A 06/15/2014   Procedure: COLONOSCOPY;  Surgeon: Rogene Houston, MD;  [facility];  Service: Endoscopy;  Laterality: N/A;  1145  . CYSTOSCOPY WITH RETROGRADE PYELOGRAM, URETEROSCOPY AND STENT PLACEMENT Bilateral 08/20/2015   Procedure: CYSTOSCOPY WITH RETROGRADE PYELOGRAM, URETEROSCOPY AND STENT PLACEMENT WITH STONE EXTRACTION WITH BASKET;  Surgeon: Cleon Gustin, MD;  Location: [facility];  Service: Urology;  Laterality: Bilateral;  . ESOPHAGEAL DILATION N/A 03/23/2015   Procedure: ESOPHAGEAL DILATION;  Surgeon: Rogene Houston, MD;  [facility];  Service: Endoscopy;   Laterality: N/A;  . ESOPHAGOGASTRODUODENOSCOPY N/A 03/23/2015   Procedure: ESOPHAGOGASTRODUODENOSCOPY (EGD);  Surgeon: Rogene Houston, MD;  [facility];  Service: Endoscopy;  Laterality: N/A;  8:55 - moved to 12:35 - Ann to notify pt  . FOOT SURGERY Bilateral   . HOLMIUM LASER APPLICATION Bilateral 10/24/1273   Procedure: HOLMIUM LASER APPLICATION;  Surgeon: Cleon Gustin, MD;  Location[facility];  Service: Urology;  Laterality: Bilateral;  . VAGINAL HYSTERECTOMY  1984    There were no vitals filed for this visit.      Subjective Assessment - 10/08/16 1551    Subjective  S:  I dont remember doing anyting to aggrevate my shoulder, it just started hurting about 2 weeks ago.    Pertinent History Cheryl Ortiz reports driving approximately 2 weeks ago and feeling a sudden pain in her right side neck and shoulder.  The pain did not ease.  She consulted with Dr. Alfonso Ramus.  X-rays detected a bone spur in her right shoulder joint.  Patient has been referred to occupational therapy for evaluation and treatment for right rotator cuff tendinopaty and cervical spondylosis.     Special Tests FOTO   Patient Stated Goals I want to get rid of the pain   Currently in Pain? Yes   Pain Score 9    Pain Location Shoulder  neck - posterior   Pain Orientation Right;Lateral   Pain Descriptors / Indicators Aching;Constant   Pain Type Acute  pain   Pain Radiating Towards elbow   Pain Onset 1 to 4 weeks ago   Pain Frequency Constant   Aggravating Factors  use   Pain Relieving Factors heat, rest   Effect of Pain on Daily Activities decreased use of right arm, decreased interest in daily activities            [facility] OT Assessment - 10/08/16 1646      Assessment   Diagnosis Right Rotator Cuff Tendonitis and Cervical Spondylosis   Referring Provider Dr. Alfonso Ramus   Onset Date 09/17/16   Prior Therapy n/a     Precautions   Precautions None     Restrictions   Weight Bearing  Restrictions No     Balance Screen   Has the patient fallen in the past 6 months No   Has the patient had a decrease in activity level because of a fear of falling?  No   Is the patient reluctant to leave their home because of a fear of falling?  No     Home  Environment   Family/patient expects to be discharged to: Private residence   Lives With Alone     Prior Function   Level of [place] Part time employment   Vocation Requirements clerical work at [employer] enjoys cooking, gardening, reading visiting     ADL   ADL comments any activity with her right arm is painful.  she is not able to use her right arm as dominant without significant pain in her right neck, shoulder and arm     Written Expression   Dominant Hand Right     Vision - History   Baseline Vision Wears glasses all the time     Cognition   Overall Cognitive Status Within Functional Limits for tasks assessed     Observation/Other Assessments   Focus on Therapeutic Outcomes (FOTO)  clinical judgement     Sensation   Light Touch Appears Intact     Coordination   Gross Motor Movements are Fluid and Coordinated Yes   Fine Motor Movements are Fluid and Coordinated Yes     ROM / Strength   AROM / PROM / Strength AROM;PROM;Strength     Palpation   Palpation comment mod-max fascial restrictions and spasms noted in right scapular and upper arm region     AROM   Overall AROM Comments assessed in seated, external and internal rotation with shoulder adducted   AROM Assessment Site Shoulder;Cervical   Right/Left Shoulder Right   Right Shoulder Flexion 120 Degrees   Right Shoulder ABduction 94 Degrees   Right Shoulder Internal Rotation 80 Degrees   Right Shoulder External Rotation 55 Degrees   Cervical Flexion 5.0 cm   Cervical Extension 15.0 cm   Cervical - Right Side Bend 16   Cervical - Left Side Bend 26   Cervical - Right Rotation 30   Cervical - Left  Rotation 40     PROM   Overall PROM Comments assessed in supine   PROM Assessment Site Shoulder   Right/Left Shoulder Right   Right Shoulder Flexion 145 Degrees   Right Shoulder ABduction 90 Degrees   Right Shoulder Internal Rotation 90 Degrees   Right Shoulder External Rotation 50 Degrees     Strength   Strength Assessment Site Shoulder   Right/Left Shoulder Right   Right Shoulder Flexion 4/5   Right Shoulder Extension 4/5   Right Shoulder ABduction 4/5  Right Shoulder Internal Rotation 4/5   Right Shoulder External Rotation 4/5                  OT Treatments/Exercises (OP) - 10/08/16 0001      Modalities   Modalities Electrical Stimulation;Moist Heat     Moist Heat Therapy   Number Minutes Moist Heat 15 Minutes   Moist Heat Location Shoulder  right     Electrical Stimulation   Electrical Stimulation Location right shoulder region   Electrical Stimulation Action sweeping continuous   Electrical Stimulation Parameters 6.4    Electrical Stimulation Goals Pain     Manual Therapy   Manual Therapy Myofascial release;Manual Traction   Manual therapy comments manual therapy completed seperately from all other interventions this date   Myofascial Release myofascial release and manual stretching to right upper arm, scapular, trapezius and cervical region to decrease pain and improve pain free mobility    Manual Traction manual cervical traction to decrease pain in her right shoulder and cervical region.                 OT Education - 10/08/16 1554    Education provided Yes   Education Details educated patient on HEP for cervical a/ROM, cervical stretches, and table slides   Person(s) Educated Patient   Methods Explanation;Demonstration;Handout   Comprehension Verbalized understanding          OT Short Term Goals - 10/08/16 1637      OT SHORT TERM GOAL #1   Title Patient will be educated on HEP for improved cervical and shoulder mobility and  decreased pain.    Time 4   Period Weeks   Status New     OT SHORT TERM GOAL #2   Title Patient will improve right shoulder P/ROM to [facility] for improved ability to don and doff clothing.    Time 4   Period Weeks   Status New     OT SHORT TERM GOAL #3   Title Patient will improve right shoulder strength to 4+/5 for imrpoved ability to lift bags of groceries.    Time 4   Period Weeks   Status New     OT SHORT TERM GOAL #4   Title Patient will improve cervical range by 25% for improved safety when driving.    Time 4   Period Weeks   Status New     OT SHORT TERM GOAL #5   Title Patient will decrease pain in her right shoulder and cervical region to 6/10 during funcitonal activities.    Time 4   Period Weeks   Status New     Additional Short Term Goals   Additional Short Term Goals Yes     OT SHORT TERM GOAL #6   Title Patient will improve postural alignment by 25% for less pain during functional activities.    Time 4   Period Weeks   Status New           OT Long Term Goals - 10/08/16 1640      OT LONG TERM GOAL #1   Title Patient will return to prior level of independence with all daily. work, and leisure activities using right arm as dominant.   Time 8   Period Weeks   Status New     OT LONG TERM GOAL #2   Title Patient will decrease right shoulder and cervical pain to 3/10 or better during function activities.    Time 8  Period Weeks   Status New     OT LONG TERM GOAL #3   Title Patient will have WNL A/ROM in right shoulder and neck for improved ability to reach into overhead cabinets and turn head when driving.    Time 8   Period Weeks   Status New     OT LONG TERM GOAL #4   Title Patient will improve right shoulder strength to 5/5 for improved ability to garden.   Time 8   Period Weeks   Status New     OT LONG TERM GOAL #5   Title Patient will improve postural alignment by 50% for less pain in neck and shoulder.   Time 8   Period Weeks   Status  New     Long Term Additional Goals   Additional Long Term Goals Yes     OT LONG TERM GOAL #6   Title Patient will decrease fascial restrictions in her right shoulder and cervical region to minimal.    Time 8   Period Weeks   Status New               Plan - 10/08/16 1627    Clinical Impression Statement A:  Patient is a 66 year old female with past medical history significant for hypertension, diabetes, numbness, and anyuresm.  Patient states she began experiencing extreme pain in her neck region and right shoulder region approximately 2 weeks ago.  She consulted with Dr. Alfonso Ramus and was diagnosed with cervical spondylosis, right shoulder rotator cuff tendonitis, and bone spur.  Her pain level is causing decreased use of her dominant right arm with functional activities, work activities, and no longer enjoys leisure activities due to increased pain in her right arm with any activity.     Rehab Potential Good   Clinical Impairments Affecting Rehab Potential age, motiviation   OT Frequency 2x / week   OT Duration 8 weeks   OT Treatment/Interventions Self-care/ADL training;Cryotherapy;Electrical Stimulation;Moist Heat;Ultrasound;Therapeutic exercise;Traction;Neuromuscular education;Energy conservation;DME and/or AE instruction;Passive range of motion;Manual Therapy;Therapeutic exercises;Therapeutic activities;Patient/family education   Plan P:  Skilled OT intervention to improve pain free mobility strength and functional activities while decreasing neck and shoulder pain in order to return to prior level of function with all daily activities. Next session:  P/AAROM progressing to A/ROM as tolerated, neck a/rom, scapular stability, postural exercises.  Review Plan of Care and HEP.    OT Home Exercise Plan 10/08/16:  cervical stretches and A/ROM, table slides   Consulted and Agree with Plan of Care Patient      Patient will benefit from skilled therapeutic intervention in order to improve  the following deficits and impairments:  Decreased range of motion, Decreased strength, Increased fascial restricitons, Impaired UE functional use, Pain, Increased muscle spasms  Visit Diagnosis: Cervicalgia - Plan: Ot plan of care cert/re-cert  Acute pain of right shoulder - Plan: Ot plan of care cert/re-cert  Stiffness of right shoulder, not elsewhere classified - Plan: Ot plan of care cert/re-cert      G-Codes - 11/91/47 1644    Functional Assessment Tool Used (Outpatient only) clinical judgement    Functional Limitation Carrying, moving and handling objects   Carrying, Moving and Handling Objects Current Status (W2956) At least 40 percent but less than 60 percent impaired, limited or restricted   Carrying, Moving and Handling Objects Goal Status (O1308) At least 20 percent but less than 40 percent impaired, limited or restricted      Problem List Patient Active  Problem List   Diagnosis Date Noted  . Cystocele 07/12/2015  . Precordial pain 02/18/2013  . Type 2 diabetes mellitus ([place]) 02/18/2013  . Hyperlipidemia 02/17/2013  . Essential hypertension, benign 02/17/2013    Vangie Bicker, [place], OTR/L [phone]  10/08/2016, 4:55 PM  [facility] [address] [place], [place], [address] Phone: [phone]   Fax:  [phone]  Name: Cheryl Ortiz MRN: [id number] Date of Birth: 1951-01-11

## 2016-10-15 ENCOUNTER — Encounter (HOSPITAL_COMMUNITY): Payer: Self-pay | Admitting: Occupational Therapy

## 2016-10-15 ENCOUNTER — Ambulatory Visit (HOSPITAL_COMMUNITY): Admitting: Occupational Therapy

## 2016-10-15 DIAGNOSIS — M542 Cervicalgia: Secondary | ICD-10-CM

## 2016-10-15 DIAGNOSIS — M25511 Pain in right shoulder: Secondary | ICD-10-CM

## 2016-10-15 DIAGNOSIS — M25611 Stiffness of right shoulder, not elsewhere classified: Secondary | ICD-10-CM

## 2016-10-15 NOTE — Therapy (Signed)
[place], [place], [address] Phone: [phone]   Fax:  [phone]  Occupational Therapy Treatment  Patient Details  Name: Cheryl Ortiz MRN: [id number] Date of Birth: 1951/01/25 Referring Provider: Dr. Alfonso Ramus  Encounter Date: 10/15/2016      OT End of Session - 10/15/16 1605    Visit Number 2   Number of Visits 16   Date for OT Re-Evaluation 12/07/16  mini reassess on 11/06/16   Authorization Type CHAMPVA   Authorization Time Period before 10th visit   Authorization - Visit Number 2   Authorization - Number of Visits 10   OT Start Time 1526  pt arrived late   OT Stop Time 1601   OT Time Calculation (min) 35 min   Activity Tolerance Patient tolerated treatment well   Behavior During Therapy [facility] for tasks assessed/performed      Past Medical History:  Diagnosis Date  . Aneurysm ([place]) 06/2015   brain-small will recheck in 1 year  . Cold   . Cystocele 07/12/2015  . Hyperlipidemia   . Hypertension   . Neuropathy of both feet    tingling/numbness bilateral feet ,rt arm,hands-nerve study scheduled  . Osteoarthritis   . Renal disorder    kidney stones  . Seasonal allergies   . Type 2 diabetes mellitus ([place])     Past Surgical History:  Procedure Laterality Date  . CHOLECYSTECTOMY  1999  . COLONOSCOPY N/A 06/15/2014   Procedure: COLONOSCOPY;  Surgeon: Rogene Houston, MD;  [facility];  Service: Endoscopy;  Laterality: N/A;  1145  . CYSTOSCOPY WITH RETROGRADE PYELOGRAM, URETEROSCOPY AND STENT PLACEMENT Bilateral 08/20/2015   Procedure: CYSTOSCOPY WITH RETROGRADE PYELOGRAM, URETEROSCOPY AND STENT PLACEMENT WITH STONE EXTRACTION WITH BASKET;  Surgeon: Cleon Gustin, MD;  Location[facility];  Service: Urology;  Laterality: Bilateral;  . ESOPHAGEAL DILATION N/A 03/23/2015   Procedure: ESOPHAGEAL DILATION;  Surgeon: Rogene Houston, MD;  [facility];  Service:  Endoscopy;  Laterality: N/A;  . ESOPHAGOGASTRODUODENOSCOPY N/A 03/23/2015   Procedure: ESOPHAGOGASTRODUODENOSCOPY (EGD);  Surgeon: Rogene Houston, MD;  [facility];  Service: Endoscopy;  Laterality: N/A;  8:55 - moved to 12:35 - Ann to notify pt  . FOOT SURGERY Bilateral   . HOLMIUM LASER APPLICATION Bilateral 5/0/5697   Procedure: HOLMIUM LASER APPLICATION;  Surgeon: Cleon Gustin, MD;  [facility];  Service: Urology;  Laterality: Bilateral;  . VAGINAL HYSTERECTOMY  1984    There were no vitals filed for this visit.      Subjective Assessment - 10/15/16 1529    Subjective  S: I went all day Monday without pain.    Currently in Pain? Yes   Pain Score 5    Pain Location Shoulder  posterior neck   Pain Orientation Right;Lateral   Pain Descriptors / Indicators Aching;Constant   Pain Type Acute pain   Pain Radiating Towards elbow   Pain Onset 1 to 4 weeks ago   Pain Frequency Constant   Aggravating Factors  use   Pain Relieving Factors heat, rest   Effect of Pain on Daily Activities decreased use of RUE as dominant            [facility] OT Assessment - 10/15/16 1530      Assessment   Diagnosis Right Rotator Cuff Tendonitis and Cervical Spondylosis     Precautions   Precautions None  OT Treatments/Exercises (OP) - 10/15/16 1530      Exercises   Exercises Shoulder     Shoulder Exercises: Supine   Protraction PROM;5 reps;AROM;10 reps   Horizontal ABduction PROM;5 reps;AROM;10 reps   External Rotation PROM;5 reps;AROM;10 reps   Internal Rotation PROM;5 reps;AROM;10 reps   Flexion PROM;5 reps;AROM;10 reps   ABduction PROM;5 reps;AROM;10 reps     Shoulder Exercises: Standing   Extension Theraband;10 reps   Theraband Level (Shoulder Extension) Level 2 (Red)   Row Theraband;10 reps   Theraband Level (Shoulder Row) Level 2 (Red)   Retraction Theraband;10 reps   Theraband Level (Shoulder Retraction) Level  2 (Red)     Shoulder Exercises: ROM/Strengthening   Prot/Ret//Elev/Dep 1'     Manual Therapy   Manual Therapy Myofascial release;Manual Traction   Manual therapy comments manual therapy completed seperately from all other interventions this date   Myofascial Release myofascial release and manual stretching to right upper arm, scapular, trapezius and cervical region to decrease pain and improve pain free mobility                 OT Education - 10/15/16 1604    Education provided Yes   Education Details provided evaluation and reviewed goals   Person(s) Educated Patient   Methods Explanation;Handout   Comprehension Verbalized understanding          OT Short Term Goals - 10/15/16 1609      OT SHORT TERM GOAL #1   Title Patient will be educated on HEP for improved cervical and shoulder mobility and decreased pain.    Time 4   Period Weeks   Status On-going     OT SHORT TERM GOAL #2   Title Patient will improve right shoulder P/ROM to [facility] for improved ability to don and doff clothing.    Time 4   Period Weeks   Status On-going     OT SHORT TERM GOAL #3   Title Patient will improve right shoulder strength to 4+/5 for imrpoved ability to lift bags of groceries.    Time 4   Period Weeks   Status On-going     OT SHORT TERM GOAL #4   Title Patient will improve cervical range by 25% for improved safety when driving.    Time 4   Period Weeks   Status On-going     OT SHORT TERM GOAL #5   Title Patient will decrease pain in her right shoulder and cervical region to 6/10 during funcitonal activities.    Time 4   Period Weeks   Status On-going     OT SHORT TERM GOAL #6   Title Patient will improve postural alignment by 25% for less pain during functional activities.    Time 4   Period Weeks   Status On-going           OT Long Term Goals - 10/15/16 1610      OT LONG TERM GOAL #1   Title Patient will return to prior level of independence with all daily.  work, and leisure activities using right arm as dominant.   Time 8   Period Weeks   Status On-going     OT LONG TERM GOAL #2   Title Patient will decrease right shoulder and cervical pain to 3/10 or better during function activities.    Time 8   Period Weeks   Status On-going     OT LONG TERM GOAL #3   Title Patient will have  WNL A/ROM in right shoulder and neck for improved ability to reach into overhead cabinets and turn head when driving.    Time 8   Period Weeks   Status On-going     OT LONG TERM GOAL #4   Title Patient will improve right shoulder strength to 5/5 for improved ability to garden.   Time 8   Period Weeks   Status On-going     OT LONG TERM GOAL #5   Title Patient will improve postural alignment by 50% for less pain in neck and shoulder.   Time 8   Period Weeks   Status On-going     OT LONG TERM GOAL #6   Title Patient will decrease fascial restrictions in her right shoulder and cervical region to minimal.    Time 8   Period Weeks   Status On-going               Plan - 10/15/16 1605    Clinical Impression Statement A: Initiated myofascial release, manual therapy, P/ROM, A/ROM, and scapular stability and strengthening exercises. Pt reports she is having more discomfort than pain now. Pt is completing HEP daily. P/ROM and A/ROM are [facility] this session, intermittent verbal cuing for form required.    Plan P: Continue with A/ROM adding in standing and updating HEP if appropriate.    OT Home Exercise Plan 10/08/16:  cervical stretches and A/ROM, table slides   Consulted and Agree with Plan of Care Patient      Patient will benefit from skilled therapeutic intervention in order to improve the following deficits and impairments:  Decreased range of motion, Decreased strength, Increased fascial restricitons, Impaired UE functional use, Pain, Increased muscle spasms  Visit Diagnosis: Cervicalgia  Acute pain of right shoulder  Stiffness of right shoulder,  not elsewhere classified    Problem List Patient Active Problem List   Diagnosis Date Noted  . Cystocele 07/12/2015  . Precordial pain 02/18/2013  . Type 2 diabetes mellitus ([place]) 02/18/2013  . Hyperlipidemia 02/17/2013  . Essential hypertension, benign 02/17/2013   Guadelupe Sabin, OTR/L  [phone] 10/15/2016, 4:10 PM  [facility] [address] [place], [place], [address] Phone: [phone]   Fax:  [phone]  Name: Cheryl Ortiz MRN: [id number] Date of Birth: [birth year]

## 2016-10-17 ENCOUNTER — Ambulatory Visit (HOSPITAL_COMMUNITY): Admitting: Specialist

## 2016-10-22 ENCOUNTER — Ambulatory Visit (HOSPITAL_COMMUNITY): Attending: Sports Medicine | Admitting: Occupational Therapy

## 2016-10-22 ENCOUNTER — Encounter (HOSPITAL_COMMUNITY): Payer: Self-pay | Admitting: Occupational Therapy

## 2016-10-22 DIAGNOSIS — M25511 Pain in right shoulder: Secondary | ICD-10-CM | POA: Insufficient documentation

## 2016-10-22 DIAGNOSIS — M542 Cervicalgia: Secondary | ICD-10-CM | POA: Insufficient documentation

## 2016-10-22 DIAGNOSIS — M25611 Stiffness of right shoulder, not elsewhere classified: Secondary | ICD-10-CM

## 2016-10-22 NOTE — Therapy (Signed)
[place], [place], [address] Phone: [phone]   Fax:  [phone]  Occupational Therapy Treatment  Patient Details  Name: KAESHA KIRSCH MRN: [id number] Date of Birth: 09-19-1950 Referring Provider: Dr. Alfonso Ramus  Encounter Date: 10/22/2016      OT End of Session - 10/22/16 1601    Visit Number 3   Number of Visits 16   Date for OT Re-Evaluation 12/07/16  mini reassess on 11/06/16   Authorization Type CHAM[employer]   Authorization Time Period before 10th visit   Authorization - Visit Number 3   Authorization - Number of Visits 10   OT Start Time 7412  pt arrived late   OT Stop Time 1601   OT Time Calculation (min) 35 min   Activity Tolerance Patient tolerated treatment well   Behavior During Therapy [facility] for tasks assessed/performed      Past Medical History:  Diagnosis Date  . Aneurysm ([place]) 06/2015   brain-small will recheck in 1 year  . Cold   . Cystocele 07/12/2015  . Hyperlipidemia   . Hypertension   . Neuropathy of both feet    tingling/numbness bilateral feet ,rt arm,hands-nerve study scheduled  . Osteoarthritis   . Renal disorder    kidney stones  . Seasonal allergies   . Type 2 diabetes mellitus ([place])     Past Surgical History:  Procedure Laterality Date  . CHOLECYSTECTOMY  1999  . COLONOSCOPY N/A 06/15/2014   Procedure: COLONOSCOPY;  Surgeon: Rogene Houston, MD;  [facility];  Service: Endoscopy;  Laterality: N/A;  1145  . CYSTOSCOPY WITH RETROGRADE PYELOGRAM, URETEROSCOPY AND STENT PLACEMENT Bilateral 08/20/2015   Procedure: CYSTOSCOPY WITH RETROGRADE PYELOGRAM, URETEROSCOPY AND STENT PLACEMENT WITH STONE EXTRACTION WITH BASKET;  Surgeon: Cleon Gustin, MD;  Location[facility];  Service: Urology;  Laterality: Bilateral;  . ESOPHAGEAL DILATION N/A 03/23/2015   Procedure: ESOPHAGEAL DILATION;  Surgeon: Rogene Houston, MD;  [facility];  Service:  Endoscopy;  Laterality: N/A;  . ESOPHAGOGASTRODUODENOSCOPY N/A 03/23/2015   Procedure: ESOPHAGOGASTRODUODENOSCOPY (EGD);  Surgeon: Rogene Houston, MD;  [facility];  Service: Endoscopy;  Laterality: N/A;  8:55 - moved to 12:35 - Ann to notify pt  . FOOT SURGERY Bilateral   . HOLMIUM LASER APPLICATION Bilateral 12/24/8674   Procedure: HOLMIUM LASER APPLICATION;  Surgeon: Cleon Gustin, MD;  Location[facility];  Service: Urology;  Laterality: Bilateral;  . VAGINAL HYSTERECTOMY  1984    There were no vitals filed for this visit.      Subjective Assessment - 10/22/16 1528    Subjective  S: I think it only gets bad when I've used it a lot or when I'm stressed.    Currently in Pain? Yes   Pain Score 4    Pain Location Shoulder   Pain Descriptors / Indicators Aching   Pain Type Acute pain   Pain Radiating Towards elbow    Pain Onset 1 to 4 weeks ago   Pain Frequency Constant   Aggravating Factors  use   Pain Relieving Factors heat, rest   Effect of Pain on Daily Activities  decreased use of RUE as dominant   Multiple Pain Sites No            [facility] OT Assessment - 10/22/16 1528      Assessment   Diagnosis Right Rotator Cuff Tendonitis and Cervical Spondylosis     Precautions  Precautions None                  OT Treatments/Exercises (OP) - 10/22/16 1529      Exercises   Exercises Shoulder     Shoulder Exercises: Supine   Protraction PROM;5 reps   Horizontal ABduction PROM;5 reps   External Rotation PROM;5 reps   Internal Rotation PROM;5 reps   Flexion PROM;5 reps   ABduction PROM;5 reps     Shoulder Exercises: Seated   Protraction AROM;10 reps   Horizontal ABduction AROM;10 reps   External Rotation AROM;10 reps   Internal Rotation AROM;10 reps   Flexion AROM;10 reps   Abduction AROM;10 reps     Shoulder Exercises: Standing   Extension Theraband;15 reps   Theraband Level (Shoulder Extension) Level 2 (Red)   Row  Theraband;15 reps   Theraband Level (Shoulder Row) Level 2 (Red)   Retraction Theraband;15 reps   Theraband Level (Shoulder Retraction) Level 2 (Red)     Shoulder Exercises: ROM/Strengthening   UBE (Upper Arm Bike) Level 1 2' forward 2' reverse  cuing for speed and direction   X to V Arms 10X   Proximal Shoulder Strengthening, Seated 10X each no rest breaks   Ball on Wall 1' flexion 1' abduction     Manual Therapy   Manual Therapy Myofascial release;Manual Traction   Manual therapy comments manual therapy completed seperately from all other interventions this date   Myofascial Release myofascial release and manual stretching to right upper arm, scapular, trapezius and cervical region to decrease pain and improve pain free mobility                 OT Education - 10/22/16 1557    Education provided Yes   Education Details A/ROM exercises   Person(s) Educated Patient   Methods Explanation;Demonstration;Handout   Comprehension Verbalized understanding;Returned demonstration          OT Short Term Goals - 10/15/16 1609      OT SHORT TERM GOAL #1   Title Patient will be educated on HEP for improved cervical and shoulder mobility and decreased pain.    Time 4   Period Weeks   Status On-going     OT SHORT TERM GOAL #2   Title Patient will improve right shoulder P/ROM to [facility] for improved ability to don and doff clothing.    Time 4   Period Weeks   Status On-going     OT SHORT TERM GOAL #3   Title Patient will improve right shoulder strength to 4+/5 for imrpoved ability to lift bags of groceries.    Time 4   Period Weeks   Status On-going     OT SHORT TERM GOAL #4   Title Patient will improve cervical range by 25% for improved safety when driving.    Time 4   Period Weeks   Status On-going     OT SHORT TERM GOAL #5   Title Patient will decrease pain in her right shoulder and cervical region to 6/10 during funcitonal activities.    Time 4   Period Weeks    Status On-going     OT SHORT TERM GOAL #6   Title Patient will improve postural alignment by 25% for less pain during functional activities.    Time 4   Period Weeks   Status On-going           OT Long Term Goals - 10/15/16 1610      OT LONG TERM  GOAL #1   Title Patient will return to prior level of independence with all daily. work, and leisure activities using right arm as dominant.   Time 8   Period Weeks   Status On-going     OT LONG TERM GOAL #2   Title Patient will decrease right shoulder and cervical pain to 3/10 or better during function activities.    Time 8   Period Weeks   Status On-going     OT LONG TERM GOAL #3   Title Patient will have WNL A/ROM in right shoulder and neck for improved ability to reach into overhead cabinets and turn head when driving.    Time 8   Period Weeks   Status On-going     OT LONG TERM GOAL #4   Title Patient will improve right shoulder strength to 5/5 for improved ability to garden.   Time 8   Period Weeks   Status On-going     OT LONG TERM GOAL #5   Title Patient will improve postural alignment by 50% for less pain in neck and shoulder.   Time 8   Period Weeks   Status On-going     OT LONG TERM GOAL #6   Title Patient will decrease fascial restrictions in her right shoulder and cervical region to minimal.    Time 8   Period Weeks   Status On-going               Plan - 10/22/16 1602    Clinical Impression Statement A: Added A/ROM in standing, ball on wall, x to v arms, increased theraband repetitions and added UBE. Pt tolerated all exercises well, minimal fatigue reported at end of session, no pain. Intermittent verbal cuing for form. Updated HEP for A/ROM.    Plan P: Add 1# weight to supine exercises, follow up on HEP   OT Home Exercise Plan 10/08/16:  cervical stretches and A/ROM, table slides; 6/6: shoulder A/ROM   Consulted and Agree with Plan of Care Patient      Patient will benefit from skilled  therapeutic intervention in order to improve the following deficits and impairments:  Decreased range of motion, Decreased strength, Increased fascial restricitons, Impaired UE functional use, Pain, Increased muscle spasms  Visit Diagnosis: Cervicalgia  Acute pain of right shoulder  Stiffness of right shoulder, not elsewhere classified    Problem List Patient Active Problem List   Diagnosis Date Noted  . Cystocele 07/12/2015  . Precordial pain 02/18/2013  . Type 2 diabetes mellitus ([place]) 02/18/2013  . Hyperlipidemia 02/17/2013  . Essential hypertension, benign 02/17/2013   Guadelupe Sabin, OTR/L  [phone] 10/22/2016, 4:03 PM  [place], [place], [address] Phone: [phone]   Fax:  [phone]  Name: JASLINE BUSKIRK MRN: [id number] Date of Birth: October 30, 1950

## 2016-10-22 NOTE — Patient Instructions (Signed)
Repeat all exercises 10-15 times, 1-2 times per day.  1) Shoulder Protraction    Begin with elbows by your side, slowly "punch" straight out in front of you keeping arms/elbows straight.      2) Shoulder Flexion  Supine:     Standing:         Begin with arms at your side with thumbs pointed up, slowly raise both arms up and forward towards overhead.               3) Horizontal abduction/adduction  Supine:   Standing:           Begin with arms straight out in front of you, bring out to the side in at "T" shape. Keep arms straight entire time.                 4) Internal & External Rotation    *No band* -Stand with elbows at the side and elbows bent 90 degrees. Move your forearms away from your body, then bring back inward toward the body.     5) Shoulder Abduction  Supine:     Standing:       Lying on your back begin with your arms flat on the table next to your side. Slowly move your arms out to the side so that they go overhead, in a jumping jack or snow angel movement.    6) X to V arms (cheerleader move):  Begin with arms straight down, crossed in front of body in an "X". Keeping arms crossed, lift arms straight up overhead. Then spread arms apart into a "V" shape.  Bring back together into x and lower down to starting position.

## 2016-10-24 ENCOUNTER — Ambulatory Visit (HOSPITAL_COMMUNITY): Admitting: Occupational Therapy

## 2016-10-24 ENCOUNTER — Encounter (HOSPITAL_COMMUNITY): Payer: Self-pay | Admitting: Occupational Therapy

## 2016-10-24 DIAGNOSIS — M25611 Stiffness of right shoulder, not elsewhere classified: Secondary | ICD-10-CM

## 2016-10-24 DIAGNOSIS — M542 Cervicalgia: Secondary | ICD-10-CM

## 2016-10-24 DIAGNOSIS — M25511 Pain in right shoulder: Secondary | ICD-10-CM

## 2016-10-24 NOTE — Therapy (Signed)
[place], [place], [address] Phone: [phone]   Fax:  [phone]  Occupational Therapy Treatment  Patient Details  Name: Cheryl Ortiz MRN: [id number] Date of Birth: 08/22/1950 Referring Provider: Dr. Alfonso Ramus  Encounter Date: 10/24/2016      OT End of Session - 10/24/16 1513    Visit Number 4   Number of Visits 16   Date for OT Re-Evaluation 12/07/16  mini reassess on 11/06/16   Authorization Type CHAMPVA   Authorization Time Period before 10th visit   Authorization - Visit Number 4   Authorization - Number of Visits 10   OT Start Time 1300   OT Stop Time 1343   OT Time Calculation (min) 43 min   Activity Tolerance Patient tolerated treatment well   Behavior During Therapy [facility] for tasks assessed/performed      Past Medical History:  Diagnosis Date  . Aneurysm ([place]) 06/2015   brain-small will recheck in 1 year  . Cold   . Cystocele 07/12/2015  . Hyperlipidemia   . Hypertension   . Neuropathy of both feet    tingling/numbness bilateral feet ,rt arm,hands-nerve study scheduled  . Osteoarthritis   . Renal disorder    kidney stones  . Seasonal allergies   . Type 2 diabetes mellitus ([place])     Past Surgical History:  Procedure Laterality Date  . CHOLECYSTECTOMY  1999  . COLONOSCOPY N/A 06/15/2014   Procedure: COLONOSCOPY;  Surgeon: Rogene Houston, MD;  [facility];  Service: Endoscopy;  Laterality: N/A;  1145  . CYSTOSCOPY WITH RETROGRADE PYELOGRAM, URETEROSCOPY AND STENT PLACEMENT Bilateral 08/20/2015   Procedure: CYSTOSCOPY WITH RETROGRADE PYELOGRAM, URETEROSCOPY AND STENT PLACEMENT WITH STONE EXTRACTION WITH BASKET;  Surgeon: Cleon Gustin, MD;  Location: [facility];  Service: Urology;  Laterality: Bilateral;  . ESOPHAGEAL DILATION N/A 03/23/2015   Procedure: ESOPHAGEAL DILATION;  Surgeon: Rogene Houston, MD;  [facility];  Service: Endoscopy;   Laterality: N/A;  . ESOPHAGOGASTRODUODENOSCOPY N/A 03/23/2015   Procedure: ESOPHAGOGASTRODUODENOSCOPY (EGD);  Surgeon: Rogene Houston, MD;  [facility];  Service: Endoscopy;  Laterality: N/A;  8:55 - moved to 12:35 - Ann to notify pt  . FOOT SURGERY Bilateral   . HOLMIUM LASER APPLICATION Bilateral 11/20/9447   Procedure: HOLMIUM LASER APPLICATION;  Surgeon: Cleon Gustin, MD;  Location: [facility];  Service: Urology;  Laterality: Bilateral;  . VAGINAL HYSTERECTOMY  1984    There were no vitals filed for this visit.      Subjective Assessment - 10/24/16 1255    Subjective  S: Now I've got pain in my other shoulder.   Currently in Pain? No/denies            [facility] OT Assessment - 10/24/16 1255      Assessment   Diagnosis Right Rotator Cuff Tendonitis and Cervical Spondylosis     Precautions   Precautions None                  OT Treatments/Exercises (OP) - 10/24/16 1302      Exercises   Exercises Shoulder     Shoulder Exercises: Supine   Protraction PROM;5 reps;Strengthening;12 reps   Protraction Weight (lbs) 1   Horizontal ABduction PROM;5 reps;Strengthening;12 reps   Horizontal ABduction Weight (lbs) 1   External Rotation PROM;5 reps;Strengthening;12 reps   External Rotation Weight (lbs) 1   Internal Rotation PROM;5 reps;Strengthening;12 reps  Internal Rotation Weight (lbs) 1   Flexion PROM;5 reps;Strengthening;12 reps   Shoulder Flexion Weight (lbs) 1   ABduction PROM;5 reps;Strengthening;12 reps   Shoulder ABduction Weight (lbs) 1     Shoulder Exercises: Seated   Protraction Strengthening;10 reps   Protraction Weight (lbs) 1   Horizontal ABduction Strengthening;10 reps   Horizontal ABduction Weight (lbs) 1   External Rotation Strengthening;10 reps   External Rotation Weight (lbs) 1   Internal Rotation Strengthening;10 reps   Internal Rotation Weight (lbs) 1   Flexion Strengthening;10 reps   Flexion Weight (lbs) 1    Abduction Strengthening;10 reps   ABduction Weight (lbs) 1     Shoulder Exercises: Sidelying   External Rotation AROM;10 reps   Internal Rotation AROM;10 reps   Flexion AROM;10 reps   ABduction AROM;10 reps   Other Sidelying Exercises protraction, A/ROM, 10X   Other Sidelying Exercises Horizontal abduction, A/ROM, 10X     Shoulder Exercises: Standing   Extension Theraband;15 reps   Theraband Level (Shoulder Extension) Level 2 (Red)   Row Theraband;15 reps   Theraband Level (Shoulder Row) Level 2 (Red)   Retraction Theraband;15 reps   Theraband Level (Shoulder Retraction) Level 2 (Red)     Shoulder Exercises: ROM/Strengthening   UBE (Upper Arm Bike) Level 1 3' forward 3' reverse   X to V Arms 10X   Proximal Shoulder Strengthening, Supine 10X each no rest breaks   Proximal Shoulder Strengthening, Seated 10X each no rest breaks   Ball on Wall 1' flexion 1' abduction     Manual Therapy   Manual Therapy Myofascial release;Manual Traction   Manual therapy comments manual therapy completed seperately from all other interventions this date   Myofascial Release myofascial release and manual stretching to right upper arm, scapular, trapezius and cervical region to decrease pain and improve pain free mobility                   OT Short Term Goals - 10/15/16 1609      OT SHORT TERM GOAL #1   Title Patient will be educated on HEP for improved cervical and shoulder mobility and decreased pain.    Time 4   Period Weeks   Status On-going     OT SHORT TERM GOAL #2   Title Patient will improve right shoulder P/ROM to [facility] for improved ability to don and doff clothing.    Time 4   Period Weeks   Status On-going     OT SHORT TERM GOAL #3   Title Patient will improve right shoulder strength to 4+/5 for imrpoved ability to lift bags of groceries.    Time 4   Period Weeks   Status On-going     OT SHORT TERM GOAL #4   Title Patient will improve cervical range by 25% for  improved safety when driving.    Time 4   Period Weeks   Status On-going     OT SHORT TERM GOAL #5   Title Patient will decrease pain in her right shoulder and cervical region to 6/10 during funcitonal activities.    Time 4   Period Weeks   Status On-going     OT SHORT TERM GOAL #6   Title Patient will improve postural alignment by 25% for less pain during functional activities.    Time 4   Period Weeks   Status On-going           OT Long Term Goals - 10/15/16 1610  OT LONG TERM GOAL #1   Title Patient will return to prior level of independence with all daily. work, and leisure activities using right arm as dominant.   Time 8   Period Weeks   Status On-going     OT LONG TERM GOAL #2   Title Patient will decrease right shoulder and cervical pain to 3/10 or better during function activities.    Time 8   Period Weeks   Status On-going     OT LONG TERM GOAL #3   Title Patient will have WNL A/ROM in right shoulder and neck for improved ability to reach into overhead cabinets and turn head when driving.    Time 8   Period Weeks   Status On-going     OT LONG TERM GOAL #4   Title Patient will improve right shoulder strength to 5/5 for improved ability to garden.   Time 8   Period Weeks   Status On-going     OT LONG TERM GOAL #5   Title Patient will improve postural alignment by 50% for less pain in neck and shoulder.   Time 8   Period Weeks   Status On-going     OT LONG TERM GOAL #6   Title Patient will decrease fascial restrictions in her right shoulder and cervical region to minimal.    Time 8   Period Weeks   Status On-going               Plan - 10/24/16 1513    Clinical Impression Statement A: Added 1# weight in supine and standing, A/ROM in sidelying, continued with scapular strengthening this session. Pt required intermittent verbal cuing for form and technique, occasional rest breaks due to fatigue. Pt reports A/ROM HEP is going well and she is  having no pain during ADL tasks.    Plan P: Add scapular theraband to HEP, continue with shoulder strengthening, add w arms.    OT Home Exercise Plan 10/08/16:  cervical stretches and A/ROM, table slides; 6/6: shoulder A/ROM   Consulted and Agree with Plan of Care Patient      Patient will benefit from skilled therapeutic intervention in order to improve the following deficits and impairments:  Decreased range of motion, Decreased strength, Increased fascial restricitons, Impaired UE functional use, Pain, Increased muscle spasms  Visit Diagnosis: Cervicalgia  Acute pain of right shoulder  Stiffness of right shoulder, not elsewhere classified    Problem List Patient Active Problem List   Diagnosis Date Noted  . Cystocele 07/12/2015  . Precordial pain 02/18/2013  . Type 2 diabetes mellitus ([place]) 02/18/2013  . Hyperlipidemia 02/17/2013  . Essential hypertension, benign 02/17/2013   Guadelupe Sabin, OTR/L  [phone] 10/24/2016, 3:25 PM  [place], [place], [address] Phone: [phone]   Fax:  [phone]  Name: Cheryl Ortiz MRN: [id number] Date of Birth: November 25, 1950

## 2016-10-27 ENCOUNTER — Encounter (HOSPITAL_COMMUNITY): Payer: Self-pay | Admitting: Specialist

## 2016-10-27 ENCOUNTER — Ambulatory Visit (HOSPITAL_COMMUNITY): Admitting: Specialist

## 2016-10-27 DIAGNOSIS — M25611 Stiffness of right shoulder, not elsewhere classified: Secondary | ICD-10-CM

## 2016-10-27 DIAGNOSIS — M542 Cervicalgia: Secondary | ICD-10-CM | POA: Diagnosis not present

## 2016-10-27 DIAGNOSIS — M25511 Pain in right shoulder: Secondary | ICD-10-CM

## 2016-10-27 NOTE — Therapy (Signed)
[place], [place], [address] Phone: [phone]   Fax:  [phone]  Occupational Therapy Treatment  Patient Details  Name: Cheryl Ortiz MRN: [id number] Date of Birth: 29-Aug-1950 Referring Provider: Dr. Alfonso Ramus  Encounter Date: 10/27/2016      OT End of Session - 10/27/16 1600    Visit Number 5   Number of Visits 16   Date for OT Re-Evaluation 12/07/16  mini reassess on 6/21   Authorization Type CHAMPVA   Authorization - Visit Number 5   Authorization - Number of Visits 10   OT Start Time 0321  arrived at 1525 for 1515 appointment   OT Stop Time 1602   OT Time Calculation (min) 37 min   Activity Tolerance Patient tolerated treatment well   Behavior During Therapy [facility] for tasks assessed/performed      Past Medical History:  Diagnosis Date  . 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 Cold   . Cystocele 07/12/2015  . Hyperlipidemia   . Hypertension   . Neuropathy of both feet    tingling/numbness bilateral feet ,rt arm,hands-nerve study scheduled  . Osteoarthritis   . Renal disorder    kidney stones  . Seasonal allergies   . Type 2 diabetes mellitus ([place])     Past Surgical History:  Procedure Laterality Date  . CHOLECYSTECTOMY  1999  . COLONOSCOPY N/A 06/15/2014   Procedure: COLONOSCOPY;  Surgeon: Rogene Houston, MD;  [facility];  Service: Endoscopy;  Laterality: N/A;  1145  . CYSTOSCOPY WITH RETROGRADE PYELOGRAM, URETEROSCOPY AND STENT PLACEMENT Bilateral 08/20/2015   Procedure: CYSTOSCOPY WITH RETROGRADE PYELOGRAM, URETEROSCOPY AND STENT PLACEMENT WITH STONE EXTRACTION WITH BASKET;  Surgeon: Cleon Gustin, MD;  Location: [facility];  Service: Urology;  Laterality: Bilateral;  . ESOPHAGEAL DILATION N/A 03/23/2015   Procedure: ESOPHAGEAL DILATION;  Surgeon: Rogene Houston, MD;  [facility];  Service: Endoscopy;  Laterality: N/A;   . ESOPHAGOGASTRODUODENOSCOPY N/A 03/23/2015   Procedure: ESOPHAGOGASTRODUODENOSCOPY (EGD);  Surgeon: Rogene Houston, MD;  [facility];  Service: Endoscopy;  Laterality: N/A;  8:55 - moved to 12:35 - Ann to notify pt  . FOOT SURGERY Bilateral   . HOLMIUM LASER APPLICATION Bilateral 06/21/4823   Procedure: HOLMIUM LASER APPLICATION;  Surgeon: Cleon Gustin, MD;  Location: [facility];  Service: Urology;  Laterality: Bilateral;  . VAGINAL HYSTERECTOMY  1984    There were no vitals filed for this visit.      Subjective Assessment - 10/27/16 1559    Subjective  S:  I had a lot of pain last night.  I am not sure why   Currently in Pain? Yes   Pain Score 3    Pain Location Shoulder   Pain Orientation Right;Posterior   Pain Descriptors / Indicators Aching   Pain Type Acute pain   Pain Onset Yesterday   Aggravating Factors  unsure   Pain Relieving Factors heat and ibuprofen            [facility] OT Assessment - 10/27/16 0001      Assessment   Diagnosis Right Rotator Cuff Tendonitis and Cervical Spondylosis     Precautions   Precautions None                  OT Treatments/Exercises (OP) - 10/27/16 0001      Exercises   Exercises Shoulder  Shoulder Exercises: Supine   Protraction PROM;5 reps;Strengthening;15 reps   Protraction Weight (lbs) 1   Horizontal ABduction PROM;5 reps;Strengthening;15 reps   Horizontal ABduction Weight (lbs) 1   External Rotation PROM;5 reps;Strengthening;15 reps   External Rotation Weight (lbs) 1   Internal Rotation PROM;5 reps;Strengthening;15 reps   Internal Rotation Weight (lbs) 1   Flexion PROM;5 reps;Strengthening;15 reps   Shoulder Flexion Weight (lbs) 1   ABduction PROM;5 reps;Strengthening;15 reps   Shoulder ABduction Weight (lbs) 1     Shoulder Exercises: Seated   Protraction Strengthening;15 reps   Protraction Weight (lbs) 1   Horizontal ABduction Strengthening;15 reps   Horizontal ABduction  Weight (lbs) 1   External Rotation Strengthening;15 reps   External Rotation Weight (lbs) 1   Internal Rotation Strengthening;15 reps   Internal Rotation Weight (lbs) 1   Flexion Strengthening;15 reps   Flexion Weight (lbs) 1   Abduction Strengthening;15 reps   ABduction Weight (lbs) 1     Shoulder Exercises: ROM/Strengthening   UBE (Upper Arm Bike) Level 2 3' forward 3' reverse   "W" Arms 15 times with 1#   X to V Arms 15 times with 1#   Proximal Shoulder Strengthening, Supine 10 times each with 1#   Proximal Shoulder Strengthening, Seated 10 times each with 1#   Ball on Wall 1' flexion 1' abduction with 1# resistance     Manual Therapy   Manual Therapy Myofascial release;Manual Traction   Manual therapy comments manual therapy completed seperately from all other interventions this date   Myofascial Release myofascial release and manual stretching to right upper arm, scapular, trapezius and cervical region to decrease pain and improve pain free mobility                   OT Short Term Goals - 10/15/16 1609      OT SHORT TERM GOAL #1   Title Patient will be educated on HEP for improved cervical and shoulder mobility and decreased pain.    Time 4   Period Weeks   Status On-going     OT SHORT TERM GOAL #2   Title Patient will improve right shoulder P/ROM to [facility] for improved ability to don and doff clothing.    Time 4   Period Weeks   Status On-going     OT SHORT TERM GOAL #3   Title Patient will improve right shoulder strength to 4+/5 for imrpoved ability to lift bags of groceries.    Time 4   Period Weeks   Status On-going     OT SHORT TERM GOAL #4   Title Patient will improve cervical range by 25% for improved safety when driving.    Time 4   Period Weeks   Status On-going     OT SHORT TERM GOAL #5   Title Patient will decrease pain in her right shoulder and cervical region to 6/10 during funcitonal activities.    Time 4   Period Weeks   Status  On-going     OT SHORT TERM GOAL #6   Title Patient will improve postural alignment by 25% for less pain during functional activities.    Time 4   Period Weeks   Status On-going           OT Long Term Goals - 10/15/16 1610      OT LONG TERM GOAL #1   Title Patient will return to prior level of independence with all daily. work, and leisure activities using right  arm as dominant.   Time 8   Period Weeks   Status On-going     OT LONG TERM GOAL #2   Title Patient will decrease right shoulder and cervical pain to 3/10 or better during function activities.    Time 8   Period Weeks   Status On-going     OT LONG TERM GOAL #3   Title Patient will have WNL A/ROM in right shoulder and neck for improved ability to reach into overhead cabinets and turn head when driving.    Time 8   Period Weeks   Status On-going     OT LONG TERM GOAL #4   Title Patient will improve right shoulder strength to 5/5 for improved ability to garden.   Time 8   Period Weeks   Status On-going     OT LONG TERM GOAL #5   Title Patient will improve postural alignment by 50% for less pain in neck and shoulder.   Time 8   Period Weeks   Status On-going     OT LONG TERM GOAL #6   Title Patient will decrease fascial restrictions in her right shoulder and cervical region to minimal.    Time 8   Period Weeks   Status On-going               Plan - 10/27/16 1601    Clinical Impression Statement A:  Added 1# to scapular stability exercises of x to v, w arms, and ball on wall   Plan P:  Add scapular theraband to HEP Attempt to 2# with supine and seated strengthening       Patient will benefit from skilled therapeutic intervention in order to improve the following deficits and impairments:  Decreased range of motion, Decreased strength, Increased fascial restricitons, Impaired UE functional use, Pain, Increased muscle spasms  Visit Diagnosis: Acute pain of right shoulder  Stiffness of right  shoulder, not elsewhere classified    Problem List Patient Active Problem List   Diagnosis Date Noted  . Cystocele 07/12/2015  . Precordial pain 02/18/2013  . Type 2 diabetes mellitus ([place]) 02/18/2013  . Hyperlipidemia 02/17/2013  . Essential hypertension, benign 02/17/2013    Vangie Bicker, [place], OTR/L [phone]  10/27/2016, 4:03 PM  [place] [address] [place], [place], [address] Phone: [phone]   Fax:  [phone]  Name: Cheryl Ortiz MRN: [id number] Date of Birth: 1950-07-05

## 2016-10-29 ENCOUNTER — Ambulatory Visit (HOSPITAL_COMMUNITY): Admitting: Specialist

## 2016-10-29 DIAGNOSIS — M25611 Stiffness of right shoulder, not elsewhere classified: Secondary | ICD-10-CM

## 2016-10-29 DIAGNOSIS — M542 Cervicalgia: Secondary | ICD-10-CM | POA: Diagnosis not present

## 2016-10-29 DIAGNOSIS — M25511 Pain in right shoulder: Secondary | ICD-10-CM

## 2016-10-29 NOTE — Patient Instructions (Signed)
(  Home) Extension: Isometric / Bilateral Arm Retraction - Sitting   Facing anchor, hold hands and elbow at shoulder height, with elbow bent.  Pull arms back to squeeze shoulder blades together. Repeat 10-15 times.  Copyright  VHI. All rights reserved.   (Home) Retraction: Row - Bilateral (Anchor)   Facing anchor, arms reaching forward, pull hands toward stomach, keeping elbows bent and at your sides and pinching shoulder blades together. Repeat 10-15 times.  Copyright  VHI. All rights reserved.   (Clinic) Extension / Flexion (Assist)   Face anchor, pull arms back, keeping elbow straight, and squeze shoulder blades together. Repeat 10-15 times.   Copyright  VHI. All rights reserved.  Strengthening: Resisted External Rotation    Hold tubing in right hand, elbow at side and forearm across body. Rotate forearm out. Repeat ____ times per set. Do ____ sets per session. Do ____ sessions per day.  http://orth.exer.us/829   Copyright  VHI. All rights reserved.  Strengthening: Resisted Internal Rotation    Hold tubing in left hand, elbow at side and forearm out. Rotate forearm in across body. Repeat ____ times per set. Do ____ sets per session. Do ____ sessions per day.  http://orth.exer.us/831   Copyright  VHI. All rights reserved.   

## 2016-10-29 NOTE — Therapy (Signed)
[place], [place], [address] Phone: [phone]   Fax:  [phone]  Occupational Therapy Treatment  Patient Details  Name: Cheryl Ortiz MRN: [id number] Date of Birth: 1950-06-24 Referring Provider: Dr. Alfonso Ramus  Encounter Date: 10/29/2016      OT End of Session - 10/29/16 1556    Visit Number 6   Number of Visits 16   Date for OT Re-Evaluation 12/07/16  mini reassess on 11/06/16   Authorization Type CHAMPVA   Authorization Time Period before 10th visit   Authorization - Visit Number 6   Authorization - Number of Visits 10   OT Start Time 1520   OT Stop Time 1600   OT Time Calculation (min) 40 min   Activity Tolerance Patient tolerated treatment well   Behavior During Therapy [facility] for tasks assessed/performed      Past Medical History:  Diagnosis Date  . Aneurysm ([place]) 06/2015   brain-small will recheck in 1 year  . Cold   . Cystocele 07/12/2015  . Hyperlipidemia   . Hypertension   . Neuropathy of both feet    tingling/numbness bilateral feet ,rt arm,hands-nerve study scheduled  . Osteoarthritis   . Renal disorder    kidney stones  . Seasonal allergies   . Type 2 diabetes mellitus ([place])     Past Surgical History:  Procedure Laterality Date  . CHOLECYSTECTOMY  1999  . COLONOSCOPY N/A 06/15/2014   Procedure: COLONOSCOPY;  Surgeon: Rogene Houston, MD;  [facility];  Service: Endoscopy;  Laterality: N/A;  1145  . CYSTOSCOPY WITH RETROGRADE PYELOGRAM, URETEROSCOPY AND STENT PLACEMENT Bilateral 08/20/2015   Procedure: CYSTOSCOPY WITH RETROGRADE PYELOGRAM, URETEROSCOPY AND STENT PLACEMENT WITH STONE EXTRACTION WITH BASKET;  Surgeon: Cleon Gustin, MD;  Location: [facility];  Service: Urology;  Laterality: Bilateral;  . ESOPHAGEAL DILATION N/A 03/23/2015   Procedure: ESOPHAGEAL DILATION;  Surgeon: Rogene Houston, MD;  [facility];  Service: Endoscopy;   Laterality: N/A;  . ESOPHAGOGASTRODUODENOSCOPY N/A 03/23/2015   Procedure: ESOPHAGOGASTRODUODENOSCOPY (EGD);  Surgeon: Rogene Houston, MD;  [facility];  Service: Endoscopy;  Laterality: N/A;  8:55 - moved to 12:35 - Ann to notify pt  . FOOT SURGERY Bilateral   . HOLMIUM LASER APPLICATION Bilateral 06/25/8339   Procedure: HOLMIUM LASER APPLICATION;  Surgeon: Cleon Gustin, MD;  Location[facility];  Service: Urology;  Laterality: Bilateral;  . VAGINAL HYSTERECTOMY  1984    There were no vitals filed for this visit.      Subjective Assessment - 10/29/16 1556    Subjective  S:  Its been feeling great the last few days   Currently in Pain? No/denies            [facility] OT Assessment - 10/29/16 0001      Assessment   Diagnosis Right Rotator Cuff Tendonitis and Cervical Spondylosis     Precautions   Precautions None     Restrictions   Weight Bearing Restrictions No                  OT Treatments/Exercises (OP) - 10/29/16 0001      Exercises   Exercises Shoulder     Shoulder Exercises: Supine   Protraction PROM;5 reps;Strengthening;10 reps   Protraction Weight (lbs) 2   Horizontal ABduction PROM;5 reps;Strengthening;10 reps   Horizontal ABduction Weight (lbs) 2   External Rotation PROM;5 reps;Strengthening;10 reps   External  Rotation Weight (lbs) 2   Internal Rotation PROM;5 reps;Strengthening;10 reps   Internal Rotation Weight (lbs) 2   Flexion PROM;5 reps;Strengthening;10 reps   Shoulder Flexion Weight (lbs) 2   ABduction PROM;5 reps;Strengthening;10 reps   Shoulder ABduction Weight (lbs) 2     Shoulder Exercises: Seated   Protraction Strengthening;10 reps   Protraction Weight (lbs) 2   Horizontal ABduction Strengthening;10 reps   Horizontal ABduction Weight (lbs) 2   External Rotation Strengthening;10 reps   External Rotation Weight (lbs) 2   Internal Rotation Strengthening;10 reps   Internal Rotation Weight (lbs) 2    Flexion Strengthening;10 reps   Flexion Weight (lbs) 2   Abduction Strengthening;10 reps   ABduction Weight (lbs) 2     Shoulder Exercises: ROM/Strengthening   "W" Arms 10 times with 2#   X to V Arms 10 times with 2#   Proximal Shoulder Strengthening, Supine 10 times with 2#   Proximal Shoulder Strengthening, Seated 10 times with 2#   Ball on Wall 1' flexion 1' abduction with 2 # resistance     Manual Therapy   Manual Therapy Myofascial release   Manual therapy comments manual therapy completed seperately from all other interventions this date   Myofascial Release myofascial release and manual stretching to right upper arm, scapular, trapezius and cervical region to decrease pain and improve pain free mobility                 OT Education - 10/29/16 1556    Education provided Yes   Education Details scapular theraband with green mod resist theraband   Person(s) Educated Patient   Methods Explanation;Demonstration;Handout   Comprehension Verbalized understanding;Returned demonstration          OT Short Term Goals - 10/15/16 1609      OT SHORT TERM GOAL #1   Title Patient will be educated on HEP for improved cervical and shoulder mobility and decreased pain.    Time 4   Period Weeks   Status On-going     OT SHORT TERM GOAL #2   Title Patient will improve right shoulder P/ROM to [facility] for improved ability to don and doff clothing.    Time 4   Period Weeks   Status On-going     OT SHORT TERM GOAL #3   Title Patient will improve right shoulder strength to 4+/5 for imrpoved ability to lift bags of groceries.    Time 4   Period Weeks   Status On-going     OT SHORT TERM GOAL #4   Title Patient will improve cervical range by 25% for improved safety when driving.    Time 4   Period Weeks   Status On-going     OT SHORT TERM GOAL #5   Title Patient will decrease pain in her right shoulder and cervical region to 6/10 during funcitonal activities.    Time 4   Period  Weeks   Status On-going     OT SHORT TERM GOAL #6   Title Patient will improve postural alignment by 25% for less pain during functional activities.    Time 4   Period Weeks   Status On-going           OT Long Term Goals - 10/15/16 1610      OT LONG TERM GOAL #1   Title Patient will return to prior level of independence with all daily. work, and leisure activities using right arm as dominant.   Time 8  Period Weeks   Status On-going     OT LONG TERM GOAL #2   Title Patient will decrease right shoulder and cervical pain to 3/10 or better during function activities.    Time 8   Period Weeks   Status On-going     OT LONG TERM GOAL #3   Title Patient will have WNL A/ROM in right shoulder and neck for improved ability to reach into overhead cabinets and turn head when driving.    Time 8   Period Weeks   Status On-going     OT LONG TERM GOAL #4   Title Patient will improve right shoulder strength to 5/5 for improved ability to garden.   Time 8   Period Weeks   Status On-going     OT LONG TERM GOAL #5   Title Patient will improve postural alignment by 50% for less pain in neck and shoulder.   Time 8   Period Weeks   Status On-going     OT LONG TERM GOAL #6   Title Patient will decrease fascial restrictions in her right shoulder and cervical region to minimal.    Time 8   Period Weeks   Status On-going               Plan - 10/29/16 1557    Clinical Impression Statement A:  increased to 2# resist with supine and seated exercises, issued green tband for scapular strengthening HEP.    Plan P:  increase strengthening repetitions, add bilateral therapy ball exercises for chest press, overhead press, flexion, overhead v      Patient will benefit from skilled therapeutic intervention in order to improve the following deficits and impairments:  Decreased range of motion, Decreased strength, Increased fascial restricitons, Impaired UE functional use, Pain, Increased  muscle spasms  Visit Diagnosis: Acute pain of right shoulder  Stiffness of right shoulder, not elsewhere classified  Cervicalgia    Problem List Patient Active Problem List   Diagnosis Date Noted  . Cystocele 07/12/2015  . Precordial pain 02/18/2013  . Type 2 diabetes mellitus ([place]) 02/18/2013  . Hyperlipidemia 02/17/2013  . Essential hypertension, benign 02/17/2013    Vangie Bicker, [place], OTR/L [phone]  10/29/2016, 4:00 PM  [place] [address] [place], [place], [address] Phone: [phone]   Fax:  [phone]  Name: Cheryl Ortiz MRN: [id number] Date of Birth: 01-31-1951

## 2016-10-31 ENCOUNTER — Other Ambulatory Visit (HOSPITAL_COMMUNITY): Payer: Self-pay | Admitting: Family Medicine

## 2016-10-31 DIAGNOSIS — Z1231 Encounter for screening mammogram for malignant neoplasm of breast: Secondary | ICD-10-CM

## 2016-11-03 ENCOUNTER — Encounter (HOSPITAL_COMMUNITY): Payer: Self-pay | Admitting: Occupational Therapy

## 2016-11-03 ENCOUNTER — Ambulatory Visit (HOSPITAL_COMMUNITY): Admitting: Occupational Therapy

## 2016-11-03 DIAGNOSIS — M542 Cervicalgia: Secondary | ICD-10-CM | POA: Diagnosis not present

## 2016-11-03 DIAGNOSIS — M25511 Pain in right shoulder: Secondary | ICD-10-CM

## 2016-11-03 DIAGNOSIS — M25611 Stiffness of right shoulder, not elsewhere classified: Secondary | ICD-10-CM

## 2016-11-03 NOTE — Therapy (Signed)
[place], [place], [address] Phone: [phone]   Fax:  [phone]  Occupational Therapy Treatment  Patient Details  Name: Cheryl Ortiz MRN: [id number] Date of Birth: [birth year] Referring Provider: Dr. Alfonso Ramus  Encounter Date: 11/03/2016      OT End of Session - 11/03/16 1559    Visit Number 7   Number of Visits 16   Date for OT Re-Evaluation 12/07/16  mini reassess on 11/06/16   Authorization Type CHAMPVA   Authorization Time Period before 10th visit   Authorization - Visit Number 7   Authorization - Number of Visits 10   OT Start Time 0539   OT Stop Time 1600   OT Time Calculation (min) 42 min   Activity Tolerance Patient tolerated treatment well   Behavior During Therapy [facility] for tasks assessed/performed      Past Medical History:  Diagnosis Date  . 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 Cold   . Cystocele 07/12/2015  . Hyperlipidemia   . Hypertension   . Neuropathy of both feet    tingling/numbness bilateral feet ,rt arm,hands-nerve study scheduled  . Osteoarthritis   . Renal disorder    kidney stones  . Seasonal allergies   . Type 2 diabetes mellitus ([place])     Past Surgical History:  Procedure Laterality Date  . CHOLECYSTECTOMY  1999  . COLONOSCOPY N/A 06/15/2014   Procedure: COLONOSCOPY;  Surgeon: Rogene Houston, MD;  [facility];  Service: Endoscopy;  Laterality: N/A;  1145  . CYSTOSCOPY WITH RETROGRADE PYELOGRAM, URETEROSCOPY AND STENT PLACEMENT Bilateral 08/20/2015   Procedure: CYSTOSCOPY WITH RETROGRADE PYELOGRAM, URETEROSCOPY AND STENT PLACEMENT WITH STONE EXTRACTION WITH BASKET;  Surgeon: Cleon Gustin, MD;  Location[facility];  Service: Urology;  Laterality: Bilateral;  . ESOPHAGEAL DILATION N/A 03/23/2015   Procedure: ESOPHAGEAL DILATION;  Surgeon: Rogene Houston, MD;  [facility];  Service: Endoscopy;   Laterality: N/A;  . ESOPHAGOGASTRODUODENOSCOPY N/A 03/23/2015   Procedure: ESOPHAGOGASTRODUODENOSCOPY (EGD);  Surgeon: Rogene Houston, MD;  [facility];  Service: Endoscopy;  Laterality: N/A;  8:55 - moved to 12:35 - Ann to notify pt  . FOOT SURGERY Bilateral   . HOLMIUM LASER APPLICATION Bilateral 11/22/7339   Procedure: HOLMIUM LASER APPLICATION;  Surgeon: Cleon Gustin, MD;  Location: [facility];  Service: Urology;  Laterality: Bilateral;  . VAGINAL HYSTERECTOMY  1984    There were no vitals filed for this visit.      Subjective Assessment - 11/03/16 1518    Subjective  S:    Currently in Pain? Yes   Pain Score 2    Pain Location Shoulder   Pain Orientation Right   Pain Descriptors / Indicators Aching   Pain Type Acute pain   Pain Radiating Towards elbow   Pain Onset In the past 7 days   Pain Frequency Intermittent   Aggravating Factors  unsure   Pain Relieving Factors heat and ibuprofen   Effect of Pain on Daily Activities decreased use of RUE as dominant   Multiple Pain Sites No            [facility] OT Assessment - 11/03/16 1518      Assessment   Diagnosis Right Rotator Cuff Tendonitis and Cervical Spondylosis     Precautions   Precautions None     Restrictions   Weight Bearing Restrictions No  OT Treatments/Exercises (OP) - 11/03/16 1521      Exercises   Exercises Shoulder     Shoulder Exercises: Supine   Protraction Strengthening;12 reps   Protraction Weight (lbs) 2   Horizontal ABduction Strengthening;12 reps   Horizontal ABduction Weight (lbs) 2   External Rotation Strengthening;12 reps   External Rotation Weight (lbs) 2   Internal Rotation Strengthening;12 reps   Internal Rotation Weight (lbs) 2   Flexion Strengthening;12 reps   Shoulder Flexion Weight (lbs) 2   ABduction Strengthening;12 reps   Shoulder ABduction Weight (lbs) 2     Shoulder Exercises: Seated   Protraction  Strengthening;12 reps   Protraction Weight (lbs) 2   Horizontal ABduction Strengthening;12 reps   Horizontal ABduction Weight (lbs) 2   External Rotation Strengthening;12 reps   External Rotation Weight (lbs) 2   Internal Rotation Strengthening;12 reps   Internal Rotation Weight (lbs) 2   Flexion Strengthening;12 reps   Flexion Weight (lbs) 2   Abduction Strengthening;12 reps   ABduction Weight (lbs) 2   Other Seated Exercises green 65cm therapy ball: chest press, overhead press, flexion, 10X each     Shoulder Exercises: Standing   Extension Theraband;15 reps   Theraband Level (Shoulder Extension) Level 2 (Red)   Row Theraband;15 reps   Theraband Level (Shoulder Row) Level 2 (Red)   Retraction Theraband;15 reps   Theraband Level (Shoulder Retraction) Level 2 (Red)     Shoulder Exercises: ROM/Strengthening   UBE (Upper Arm Bike) Level 2 3' forward 3' reverse   X to V Arms 12 times with 2#   Proximal Shoulder Strengthening, Supine 10 times with 2#   Proximal Shoulder Strengthening, Seated 10 times with 2#   Ball on Wall 1' flexion 1' abduction      Manual Therapy   Manual Therapy Myofascial release   Manual therapy comments manual therapy completed seperately from all other interventions this date   Myofascial Release myofascial release and manual stretching to right upper arm, scapular, trapezius and cervical region to decrease pain and improve pain free mobility                   OT Short Term Goals - 10/15/16 1609      OT SHORT TERM GOAL #1   Title Patient will be educated on HEP for improved cervical and shoulder mobility and decreased pain.    Time 4   Period Weeks   Status On-going     OT SHORT TERM GOAL #2   Title Patient will improve right shoulder P/ROM to [facility] for improved ability to don and doff clothing.    Time 4   Period Weeks   Status On-going     OT SHORT TERM GOAL #3   Title Patient will improve right shoulder strength to 4+/5 for imrpoved  ability to lift bags of groceries.    Time 4   Period Weeks   Status On-going     OT SHORT TERM GOAL #4   Title Patient will improve cervical range by 25% for improved safety when driving.    Time 4   Period Weeks   Status On-going     OT SHORT TERM GOAL #5   Title Patient will decrease pain in her right shoulder and cervical region to 6/10 during funcitonal activities.    Time 4   Period Weeks   Status On-going     OT SHORT TERM GOAL #6   Title Patient will improve postural alignment by 25%  for less pain during functional activities.    Time 4   Period Weeks   Status On-going           OT Long Term Goals - 10/15/16 1610      OT LONG TERM GOAL #1   Title Patient will return to prior level of independence with all daily. work, and leisure activities using right arm as dominant.   Time 8   Period Weeks   Status On-going     OT LONG TERM GOAL #2   Title Patient will decrease right shoulder and cervical pain to 3/10 or better during function activities.    Time 8   Period Weeks   Status On-going     OT LONG TERM GOAL #3   Title Patient will have WNL A/ROM in right shoulder and neck for improved ability to reach into overhead cabinets and turn head when driving.    Time 8   Period Weeks   Status On-going     OT LONG TERM GOAL #4   Title Patient will improve right shoulder strength to 5/5 for improved ability to garden.   Time 8   Period Weeks   Status On-going     OT LONG TERM GOAL #5   Title Patient will improve postural alignment by 50% for less pain in neck and shoulder.   Time 8   Period Weeks   Status On-going     OT LONG TERM GOAL #6   Title Patient will decrease fascial restrictions in her right shoulder and cervical region to minimal.    Time 8   Period Weeks   Status On-going               Plan - 11/03/16 1559    Clinical Impression Statement A: Continued with strengthening tasks using 2# weight increasing repetitions to 12, added green  therapy ball exercises. Pt requires intermittent cuing for form, reports HEP is going well.    Plan P: Mini-reassessment and possible discharge? Update HEP. Update G-Code   OT Home Exercise Plan 10/08/16:  cervical stretches and A/ROM, table slides; 6/6: shoulder A/ROM   Consulted and Agree with Plan of Care Patient      Patient will benefit from skilled therapeutic intervention in order to improve the following deficits and impairments:  Decreased range of motion, Decreased strength, Increased fascial restricitons, Impaired UE functional use, Pain, Increased muscle spasms  Visit Diagnosis: Acute pain of right shoulder  Stiffness of right shoulder, not elsewhere classified    Problem List Patient Active Problem List   Diagnosis Date Noted  . Cystocele 07/12/2015  . Precordial pain 02/18/2013  . Type 2 diabetes mellitus ([place]) 02/18/2013  . Hyperlipidemia 02/17/2013  . Essential hypertension, benign 02/17/2013   Guadelupe Sabin, OTR/L  [phone] 11/03/2016, 4:05 PM  [place], [place], [address] Phone: [phone]   Fax:  [phone]  Name: Cheryl Ortiz MRN: [id number] Date of Birth: 1950-12-13

## 2016-11-05 ENCOUNTER — Ambulatory Visit (HOSPITAL_COMMUNITY): Admitting: Specialist

## 2016-11-05 ENCOUNTER — Encounter (HOSPITAL_COMMUNITY): Payer: Self-pay | Admitting: Specialist

## 2016-11-05 DIAGNOSIS — M25511 Pain in right shoulder: Secondary | ICD-10-CM

## 2016-11-05 DIAGNOSIS — M542 Cervicalgia: Secondary | ICD-10-CM

## 2016-11-05 DIAGNOSIS — M25611 Stiffness of right shoulder, not elsewhere classified: Secondary | ICD-10-CM

## 2016-11-05 NOTE — Therapy (Signed)
[place], [place], [address] Phone: [phone]   Fax:  [phone]  Occupational Therapy Treatment  Patient Details  Name: Cheryl Ortiz MRN: [id number] Date of Birth: [birth year] Referring Provider: Dr. Alfonso Ramus  Encounter Date: 11/05/2016      OT End of Session - 11/05/16 1605    Visit Number 8   Number of Visits 16   Date for OT Re-Evaluation 12/07/16   Authorization Type CHAMPVA   Authorization Time Period before 10th visit   Authorization - Visit Number 8   OT Start Time 1525   OT Stop Time 1545   OT Time Calculation (min) 20 min   Activity Tolerance Patient tolerated treatment well   Behavior During Therapy [facility] for tasks assessed/performed      Past Medical History:  Diagnosis Date  . 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 Cold   . Cystocele 07/12/2015  . Hyperlipidemia   . Hypertension   . Neuropathy of both feet    tingling/numbness bilateral feet ,rt arm,hands-nerve study scheduled  . Osteoarthritis   . Renal disorder    kidney stones  . Seasonal allergies   . Type 2 diabetes mellitus ([place])     Past Surgical History:  Procedure Laterality Date  . CHOLECYSTECTOMY  1999  . COLONOSCOPY N/A 06/15/2014   Procedure: COLONOSCOPY;  Surgeon: Rogene Houston, MD;  [facility];  Service: Endoscopy;  Laterality: N/A;  1145  . CYSTOSCOPY WITH RETROGRADE PYELOGRAM, URETEROSCOPY AND STENT PLACEMENT Bilateral 08/20/2015   Procedure: CYSTOSCOPY WITH RETROGRADE PYELOGRAM, URETEROSCOPY AND STENT PLACEMENT WITH STONE EXTRACTION WITH BASKET;  Surgeon: Cleon Gustin, MD;  [facility];  Service: Urology;  Laterality: Bilateral;  . ESOPHAGEAL DILATION N/A 03/23/2015   Procedure: ESOPHAGEAL DILATION;  Surgeon: Rogene Houston, MD;  [facility];  Service: Endoscopy;  Laterality: N/A;  . ESOPHAGOGASTRODUODENOSCOPY N/A 03/23/2015   Procedure: ESOPHAGOGASTRODUODENOSCOPY (EGD);  Surgeon: Rogene Houston, MD;  [facility];  Service: Endoscopy;  Laterality: N/A;  8:55 - moved to 12:35 - Ann to notify pt  . FOOT SURGERY Bilateral   . HOLMIUM LASER APPLICATION Bilateral 01/24/9891   Procedure: HOLMIUM LASER APPLICATION;  Surgeon: Cleon Gustin, MD;  Location: [facility];  Service: Urology;  Laterality: Bilateral;  . VAGINAL HYSTERECTOMY  1984    There were no vitals filed for this visit.      Subjective Assessment - 11/05/16 1604    Subjective  S:  I am back to doing everything I want to do and I am ready to go back to [facility] the senior center.    Currently in Pain? No/denies            [facility] OT Assessment - 11/05/16 0001      Assessment   Diagnosis Right Rotator Cuff Tendonitis and Cervical Spondylosis     Precautions   Precautions None     ADL   ADL comments patient is able to garden, cook, and complete all daily activities without pain.  she is ready to return to her exercise class at the senior center.      AROM   Overall AROM Comments assessed in seated, external and internal rotation with shoulder adducted (initial evlauation)   Right Shoulder Flexion 160 Degrees  120   Right Shoulder ABduction 160 Degrees  94   Right Shoulder Internal Rotation  90 Degrees  80   Right Shoulder External Rotation 60 Degrees  55   Cervical Flexion 0  5 cm   Cervical Extension 20 cm  15 cm   Cervical - Right Side Bend 25  16   Cervical - Left Side Bend 32  26   Cervical - Right Rotation 45  30   Cervical - Left Rotation 50  40     PROM   Overall PROM Comments WNL     Strength   Right Shoulder Flexion 5/5  4/5   Right Shoulder ABduction 5/5  4/5   Right Shoulder Internal Rotation 5/5  4/5   Right Shoulder External Rotation 5/5  4/5                  OT Treatments/Exercises (OP) - 11/05/16 0001      Manual Therapy   Manual Therapy Myofascial  release   Manual therapy comments manual therapy completed seperately from all other interventions this date   Myofascial Release myofascial release and manual stretching to right upper arm, scapular, trapezius and cervical region to decrease pain and improve pain free mobility                   OT Short Term Goals - 11/05/16 1541      OT SHORT TERM GOAL #1   Title Patient will be educated on HEP for improved cervical and shoulder mobility and decreased pain.    Status Achieved     OT SHORT TERM GOAL #2   Title Patient will improve right shoulder P/ROM to [facility] for improved ability to don and doff clothing.    Status Achieved     OT SHORT TERM GOAL #3   Title Patient will improve right shoulder strength to 4+/5 for imrpoved ability to lift bags of groceries.    Status Achieved     OT SHORT TERM GOAL #4   Title Patient will improve cervical range by 25% for improved safety when driving.    Status Achieved     OT SHORT TERM GOAL #5   Title Patient will decrease pain in her right shoulder and cervical region to 6/10 during funcitonal activities.    Status Achieved     OT SHORT TERM GOAL #6   Title Patient will improve postural alignment by 25% for less pain during functional activities.    Status Achieved           OT Long Term Goals - 11/05/16 1542      OT LONG TERM GOAL #1   Title Patient will return to prior level of independence with all daily. work, and leisure activities using right arm as dominant.   Time 8   Period Weeks   Status Achieved     OT LONG TERM GOAL #2   Title Patient will decrease right shoulder and cervical pain to 3/10 or better during function activities.    Time 8   Period Weeks   Status Achieved     OT LONG TERM GOAL #3   Title Patient will have WNL A/ROM in right shoulder and neck for improved ability to reach into overhead cabinets and turn head when driving.    Time 8   Period Weeks   Status Achieved     OT LONG TERM GOAL #4    Title Patient will improve right shoulder strength to 5/5 for improved ability to garden.   Time 8   Period Weeks   Status  Achieved     OT LONG TERM GOAL #5   Title Patient will improve postural alignment by 50% for less pain in neck and shoulder.   Time 8   Period Weeks   Status Achieved     OT LONG TERM GOAL #6   Title Patient will decrease fascial restrictions in her right shoulder and cervical region to minimal.    Time 8   Period Weeks   Status Achieved               Plan - Nov 14, 2016 1605    Clinical Impression Statement A:  Patient has met all OT goals, making signficant improvements in pain level, range of motion, and strength.  She is now able to complete all of her daily activities with out pain.  Patient states she feels ready for discharge from skilled OT intervention this date.    Plan P:  DC from skilled OT inntervention this date.    OT Home Exercise Plan 10/08/16:  cervical stretches and A/ROM, table slides; 6/6: shoulder A/ROM   Consulted and Agree with Plan of Care Patient      Patient will benefit from skilled therapeutic intervention in order to improve the following deficits and impairments:  Decreased range of motion, Decreased strength, Increased fascial restricitons, Impaired UE functional use, Pain, Increased muscle spasms  Visit Diagnosis: Acute pain of right shoulder  Stiffness of right shoulder, not elsewhere classified  Cervicalgia      G-Codes - November 14, 2016 1607    Functional Assessment Tool Used (Outpatient only) clinical judgement    Functional Limitation Carrying, moving and handling objects   Carrying, Moving and Handling Objects Goal Status ([id number]) At least 20 percent but less than 40 percent impaired, limited or restricted   Carrying, Moving and Handling Objects Discharge Status [phone]) At least 1 percent but less than 20 percent impaired, limited or restricted      Problem List Patient Active Problem List   Diagnosis Date Noted  .  Cystocele 07/12/2015  . Precordial pain 02/18/2013  . Type 2 diabetes mellitus ([place]) 02/18/2013  . Hyperlipidemia 02/17/2013  . Essential hypertension, benign 02/17/2013    Vangie Bicker, MHA, OTR/L [phone] OCCUPATIONAL THERAPY DISCHARGE SUMMARY  Visits from Start of Care: 8  Current functional level related to goals / functional outcomes: patient is able to garden, cook, and complete all daily activities without pain.  she is ready to return to her exercise class at the senior center.    Remaining deficits: n/a   Education / Equipment: Shoulder strengthening  Plan: Patient agrees to discharge.  Patient goals were met. Patient is being discharged due to meeting the stated rehab goals.  ?????         Vangie Bicker, [place], OTR/L [phone]   Nov 14, 2016  , 4:11 PM  [place] [address] [place], [place], [address] Phone: [phone]   Fax:  [phone]  Name: Cheryl Ortiz MRN: [id number] Date of Birth: 05/14/1951

## 2016-11-10 ENCOUNTER — Encounter (HOSPITAL_COMMUNITY): Admitting: Occupational Therapy

## 2016-11-12 ENCOUNTER — Encounter (HOSPITAL_COMMUNITY): Admitting: Occupational Therapy

## 2016-11-17 ENCOUNTER — Encounter (HOSPITAL_COMMUNITY): Admitting: Occupational Therapy

## 2016-11-20 ENCOUNTER — Encounter (HOSPITAL_COMMUNITY): Admitting: Occupational Therapy

## 2016-11-24 ENCOUNTER — Encounter (HOSPITAL_COMMUNITY): Admitting: Specialist

## 2016-11-26 ENCOUNTER — Encounter (HOSPITAL_COMMUNITY): Admitting: Specialist

## 2016-12-01 ENCOUNTER — Encounter (HOSPITAL_COMMUNITY)

## 2016-12-08 ENCOUNTER — Ambulatory Visit (HOSPITAL_COMMUNITY)

## 2016-12-08 ENCOUNTER — Ambulatory Visit (HOSPITAL_COMMUNITY)
Admission: RE | Admit: 2016-12-08 | Discharge: 2016-12-08 | Disposition: A | Discharge status: Home / Self Care | Source: Ambulatory Visit | Attending: Family Medicine | Admitting: Family Medicine

## 2016-12-08 DIAGNOSIS — Z1231 Encounter for screening mammogram for malignant neoplasm of breast: Secondary | ICD-10-CM | POA: Diagnosis not present

## 2017-02-24 IMAGING — CR DG STERNUM 2+V
2 series · 2 of 2 positions shown · non-contrast
Comparison: Chest radiographs 06/09/2014

CLINICAL DATA: Knot at upper to mid sternum for 2 years, swelling

EXAM:
STERNUM - 2+ VIEW

[view not recorded (1 of 2)]
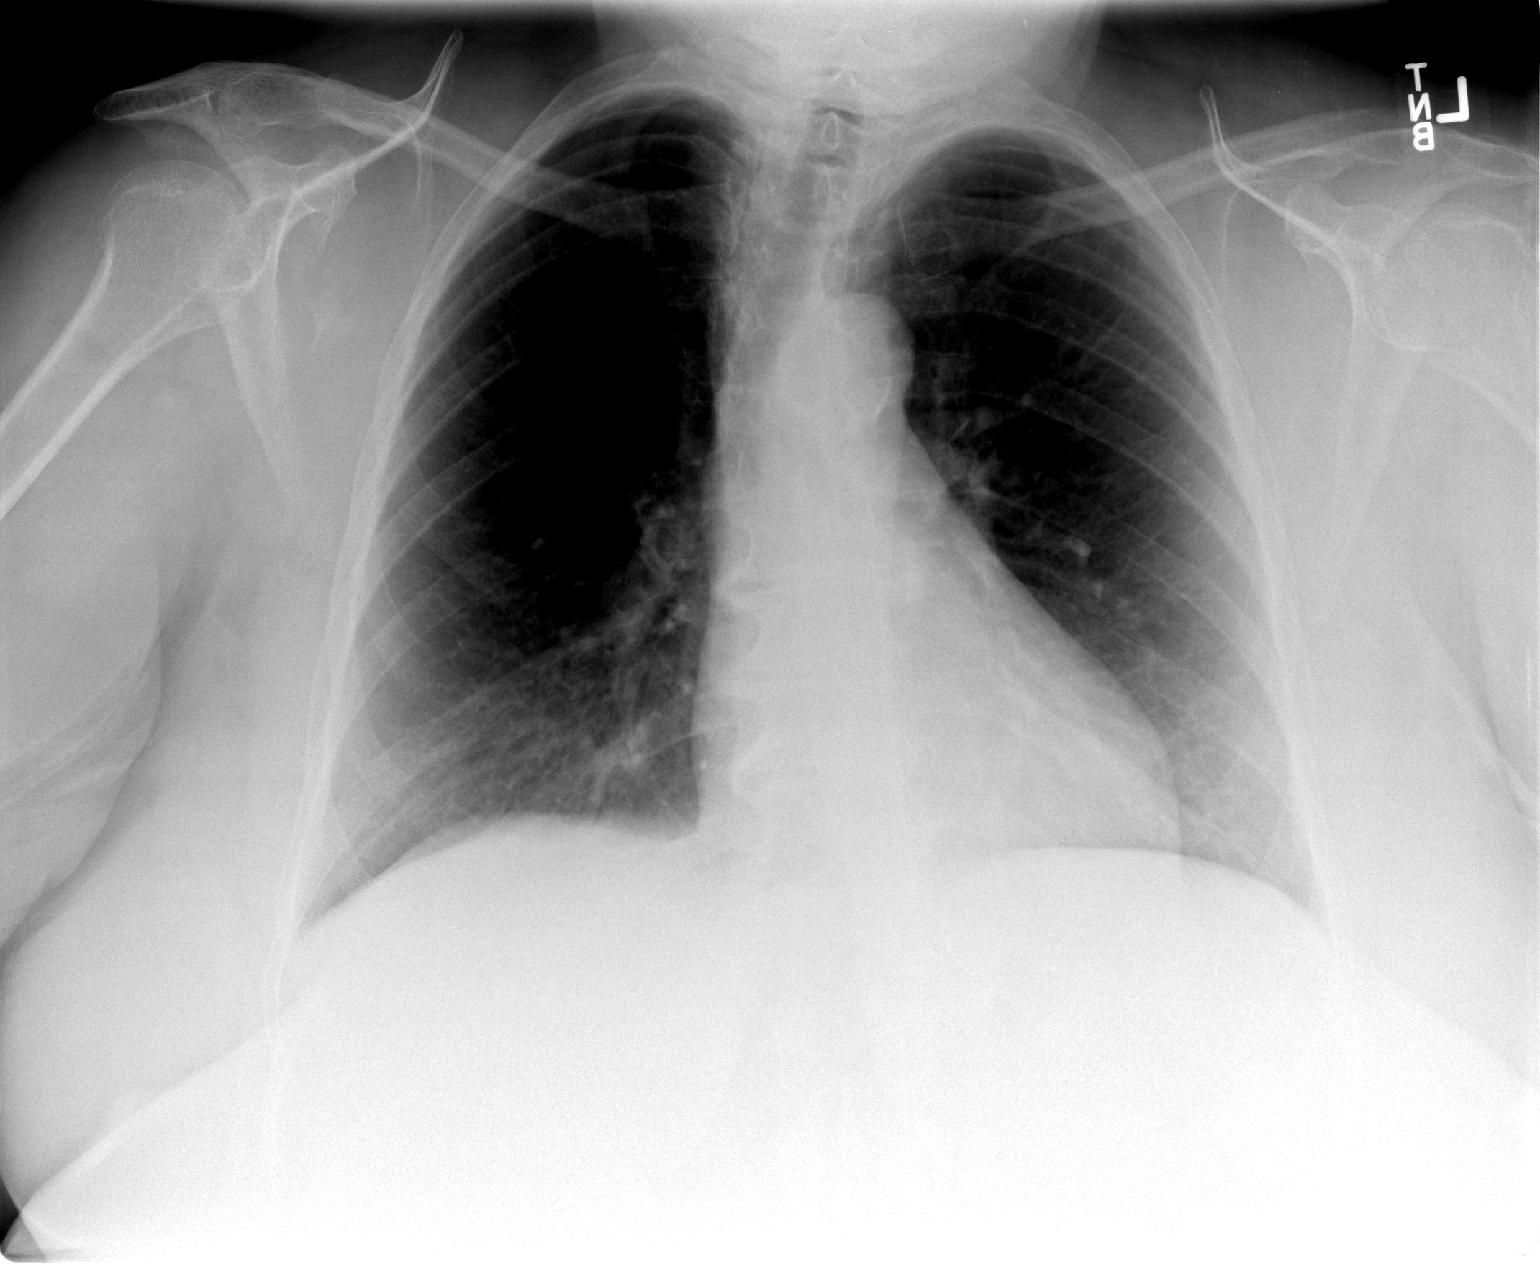

[view not recorded (2 of 2)]
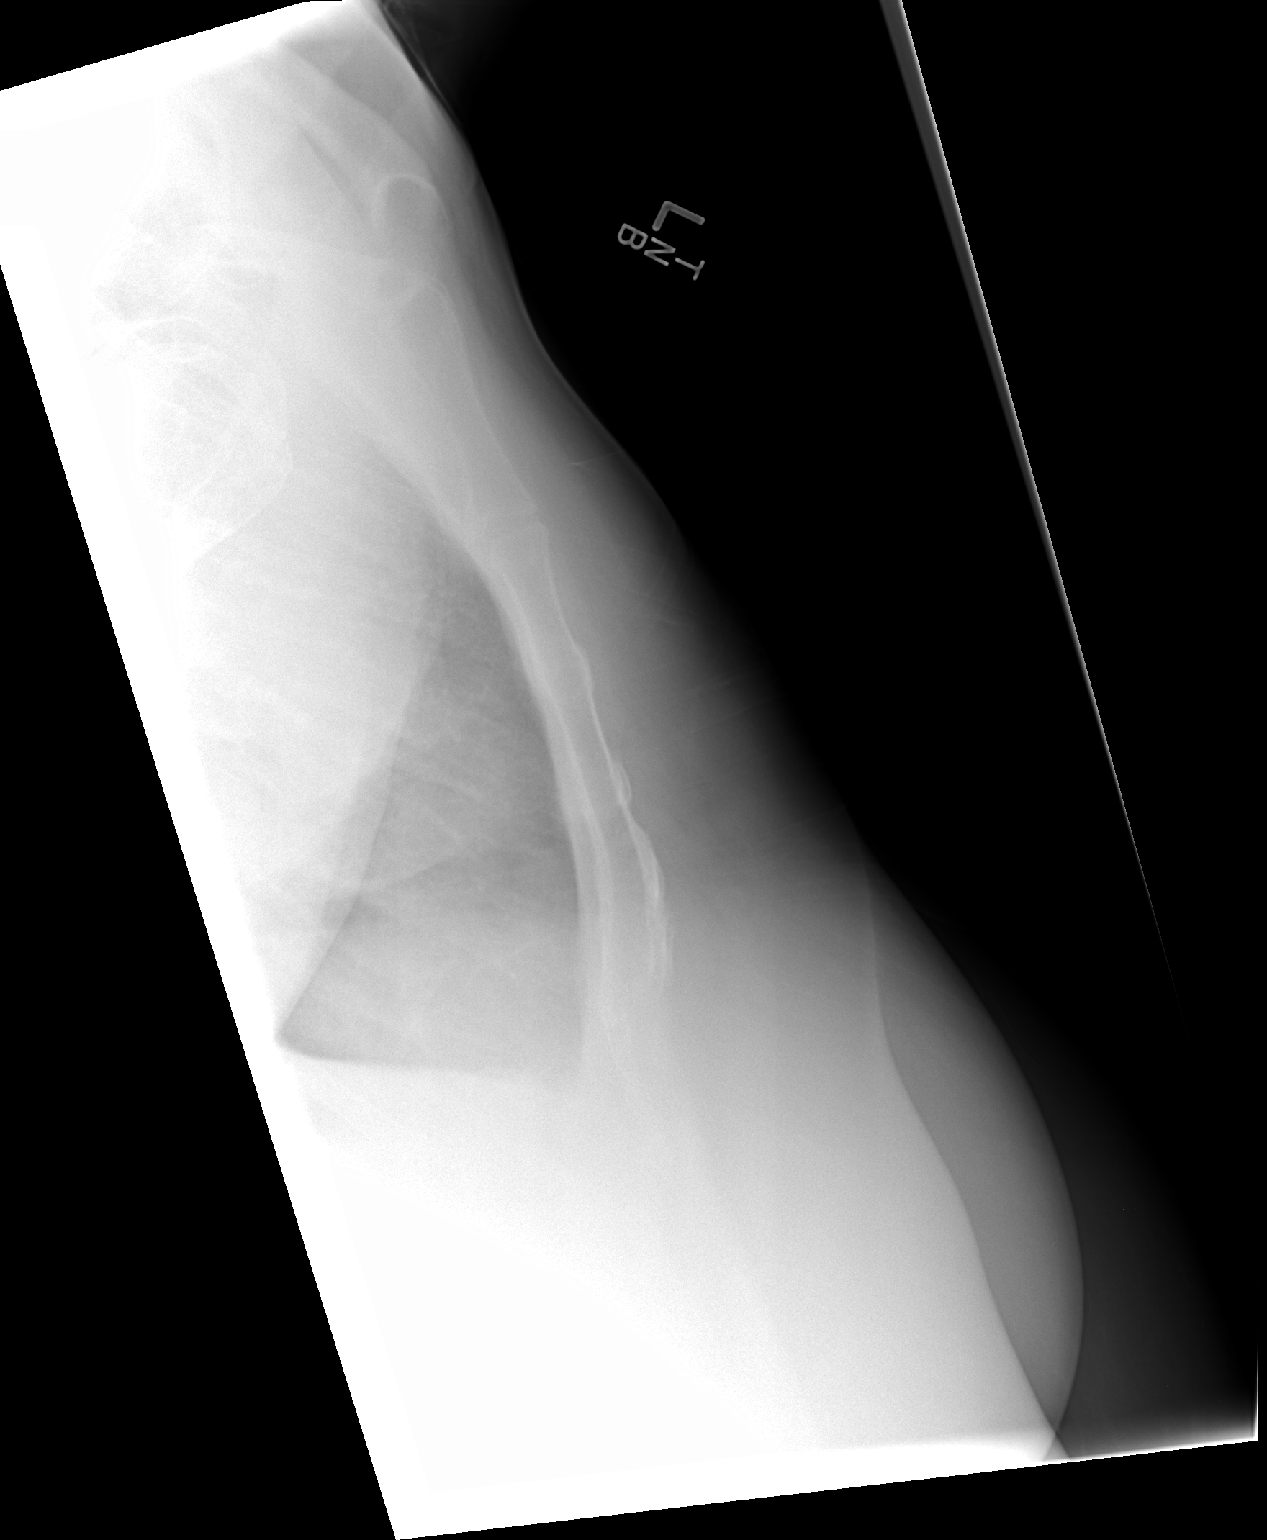

[2 of 2 positions shown; findings below may reference images not displayed]

FINDINGS: Upper normal heart size.

Atherosclerotic calcification aorta.

Mediastinal contours and pulmonary vascularity normal.

Lungs clear.

No pleural effusion or pneumothorax.

Sternum is grossly normal in appearance and no retrosternal soft
tissue density is identified.

Scattered degenerative disc disease thoracic spine.
IMPRESSION: No acute abnormalities.

## 2017-07-29 ENCOUNTER — Other Ambulatory Visit: Payer: Self-pay | Admitting: Sports Medicine

## 2017-07-29 DIAGNOSIS — M7581 Other shoulder lesions, right shoulder: Secondary | ICD-10-CM | POA: Diagnosis not present

## 2017-07-29 DIAGNOSIS — M47812 Spondylosis without myelopathy or radiculopathy, cervical region: Secondary | ICD-10-CM | POA: Diagnosis not present

## 2017-07-29 DIAGNOSIS — M542 Cervicalgia: Secondary | ICD-10-CM

## 2017-07-29 DIAGNOSIS — M7582 Other shoulder lesions, left shoulder: Secondary | ICD-10-CM | POA: Diagnosis not present

## 2017-07-31 DIAGNOSIS — M791 Myalgia, unspecified site: Secondary | ICD-10-CM | POA: Diagnosis not present

## 2017-07-31 DIAGNOSIS — R768 Other specified abnormal immunological findings in serum: Secondary | ICD-10-CM | POA: Diagnosis not present

## 2017-07-31 DIAGNOSIS — M2550 Pain in unspecified joint: Secondary | ICD-10-CM | POA: Diagnosis not present

## 2017-08-04 DIAGNOSIS — Z1389 Encounter for screening for other disorder: Secondary | ICD-10-CM | POA: Diagnosis not present

## 2017-08-04 DIAGNOSIS — E119 Type 2 diabetes mellitus without complications: Secondary | ICD-10-CM | POA: Diagnosis not present

## 2017-08-04 DIAGNOSIS — I10 Essential (primary) hypertension: Secondary | ICD-10-CM | POA: Diagnosis not present

## 2017-08-04 DIAGNOSIS — E782 Mixed hyperlipidemia: Secondary | ICD-10-CM | POA: Diagnosis not present

## 2017-08-04 DIAGNOSIS — E1140 Type 2 diabetes mellitus with diabetic neuropathy, unspecified: Secondary | ICD-10-CM | POA: Diagnosis not present

## 2017-08-04 DIAGNOSIS — Z6837 Body mass index (BMI) 37.0-37.9, adult: Secondary | ICD-10-CM | POA: Diagnosis not present

## 2017-08-06 IMAGING — MR MR MRA HEAD W/O CM
1 series · 13 of 48 positions shown · non-contrast
Comparison: MRI head 06/08/2015

ADDENDUM:
Correction to the original report. In the body of the report in the
last sentence should read "There is a 2 mm outpouching of the right
M1 segment"
CLINICAL DATA: Headache.  Abnormal MRI study.

EXAM:
MRA HEAD WITHOUT CONTRAST
TECHNIQUE: Angiographic images of the Circle of Willis were obtained using MRA
technique without intravenous contrast.

[Series 3: MRA · axial · 0.6mm · 0.33mm/px · z∈[-100,+3]mm · 13 of 181 slices shown]
[im 1/181]
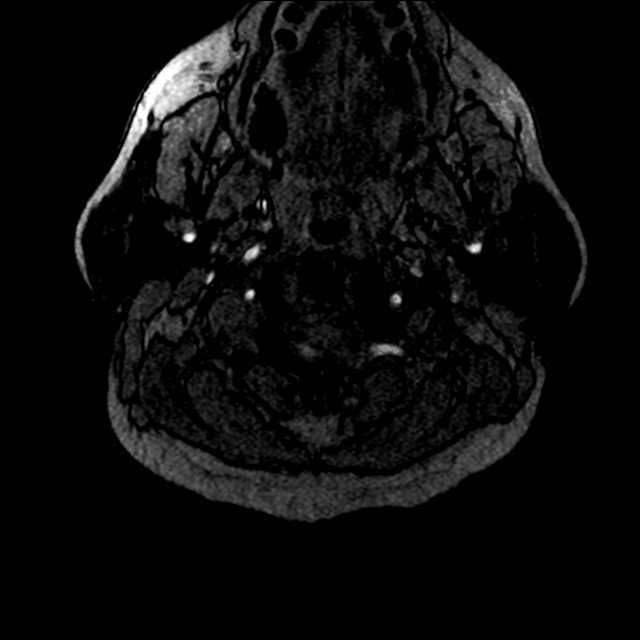
[im 4/181]
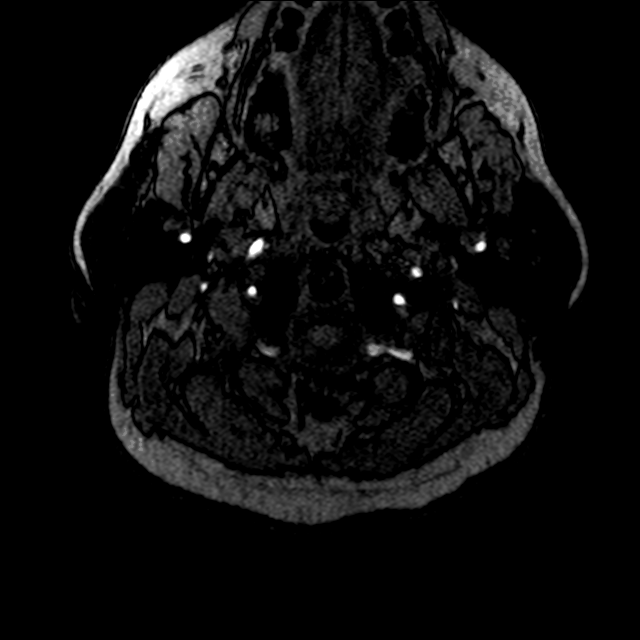
[im 12/181]
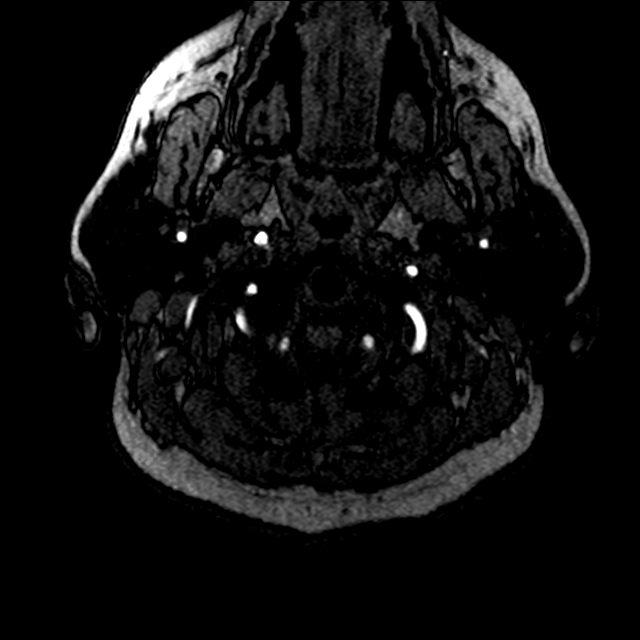
[im 31/181]
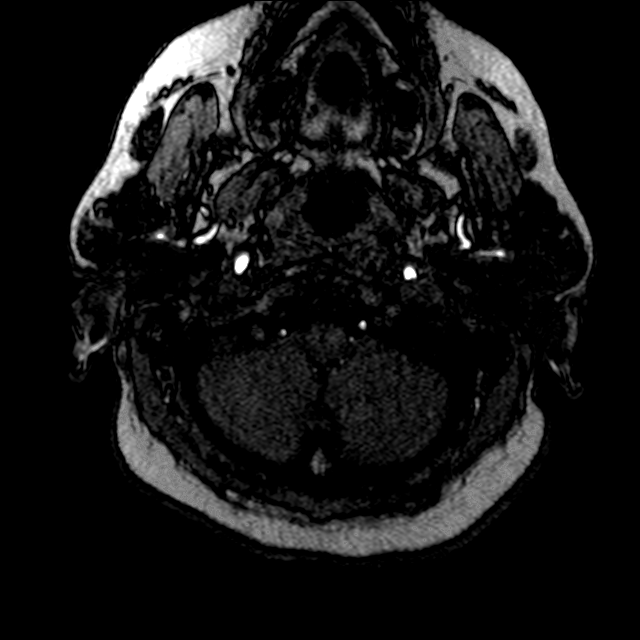
[im 35/181]
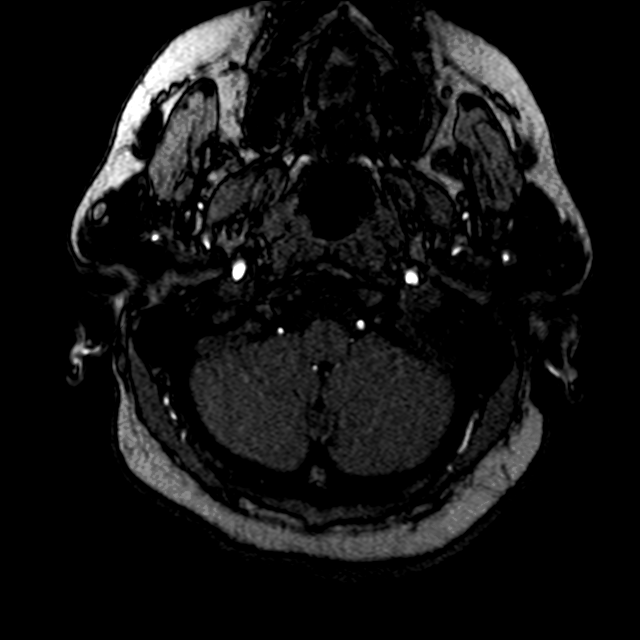
[im 58/181]
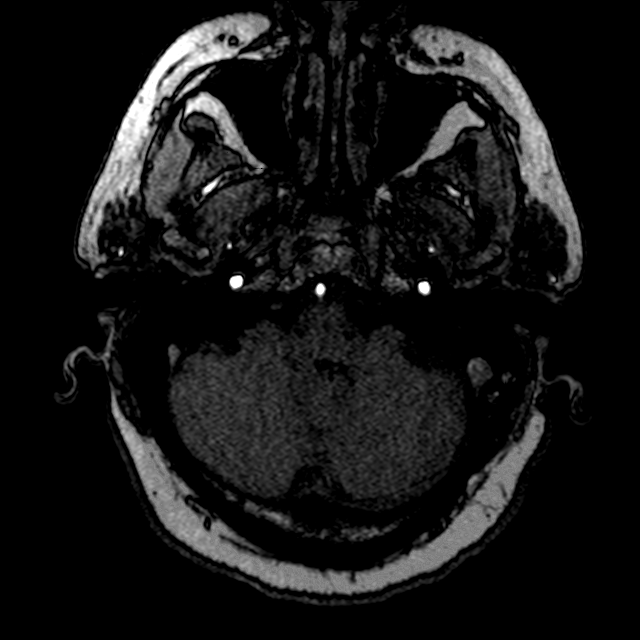
[im 81/181]
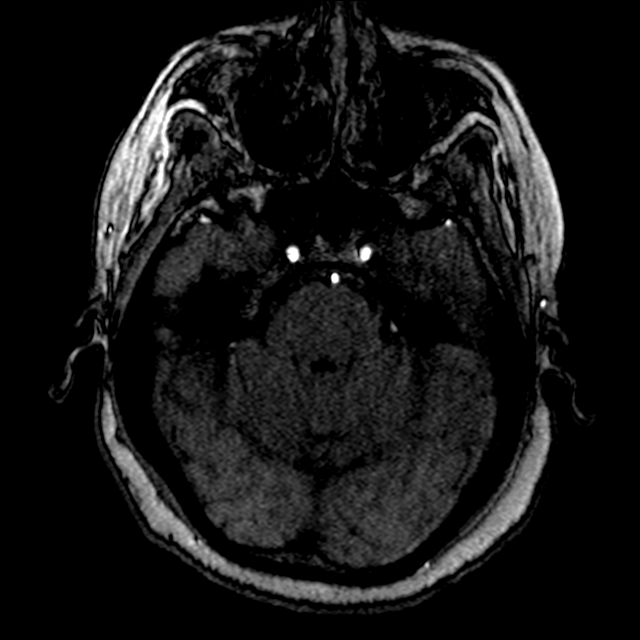
[im 92/181]
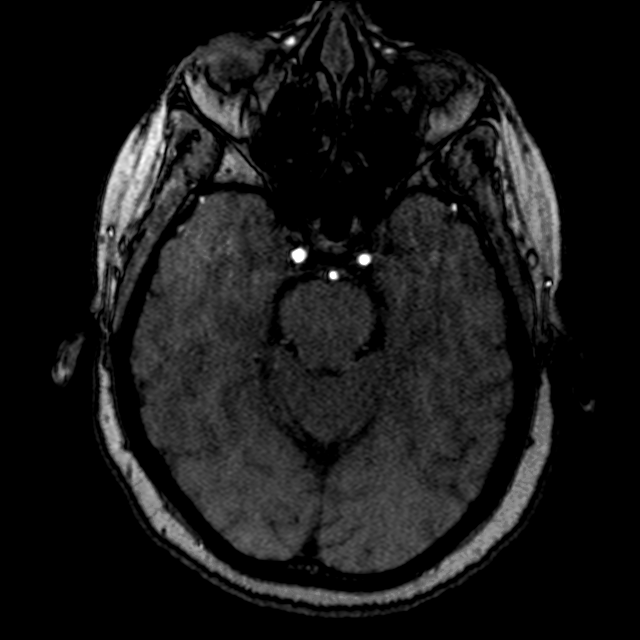
[im 104/181]
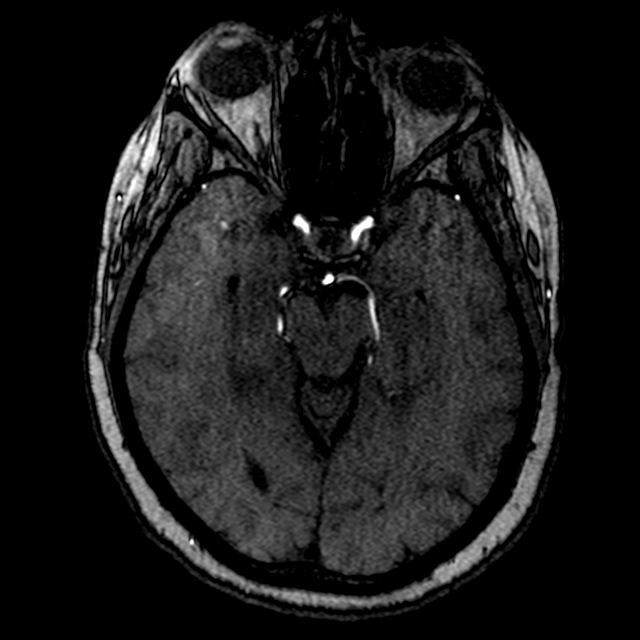
[im 127/181]
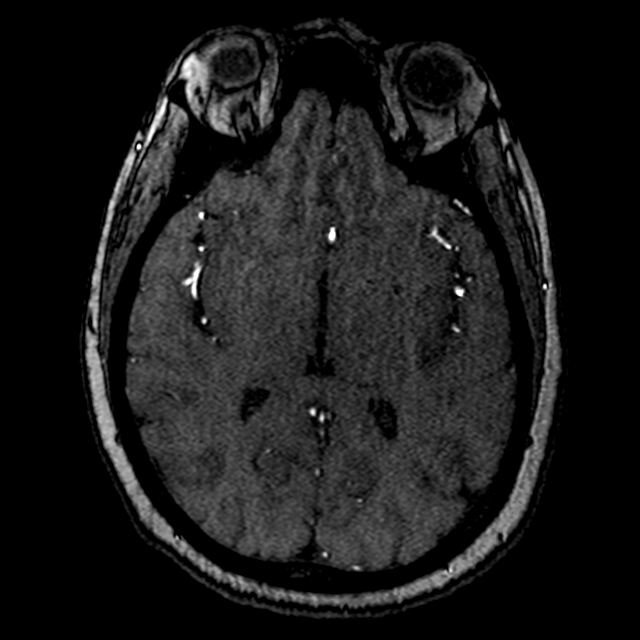
[im 150/181]
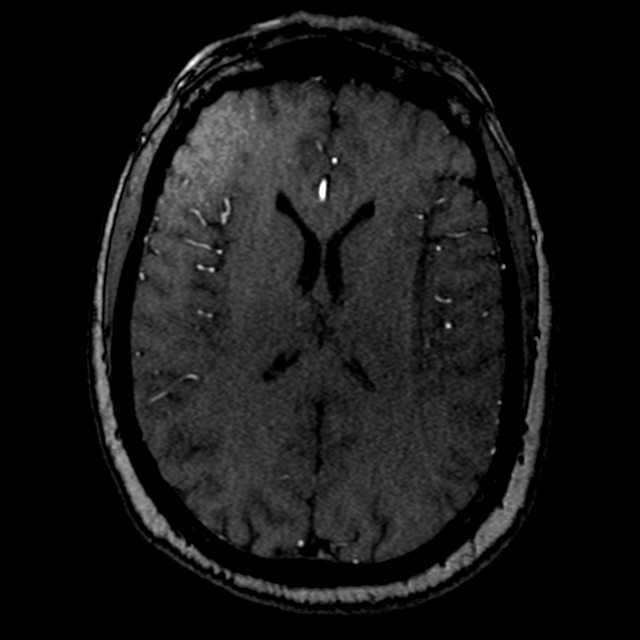
[im 154/181]
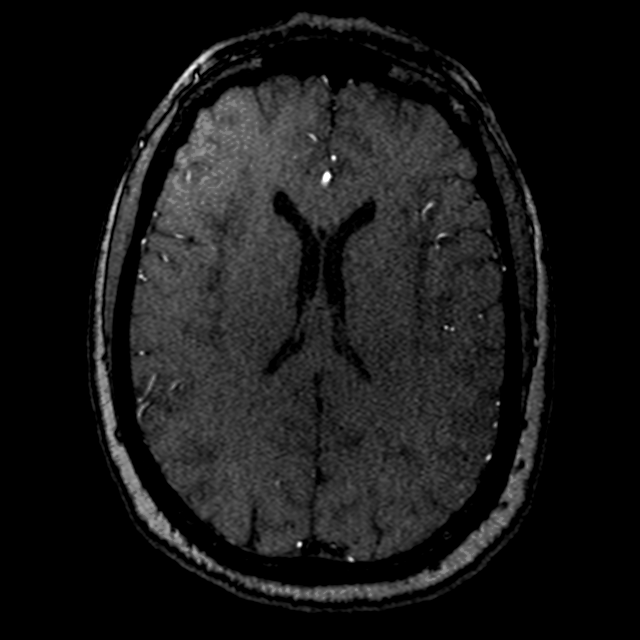
[im 173/181]
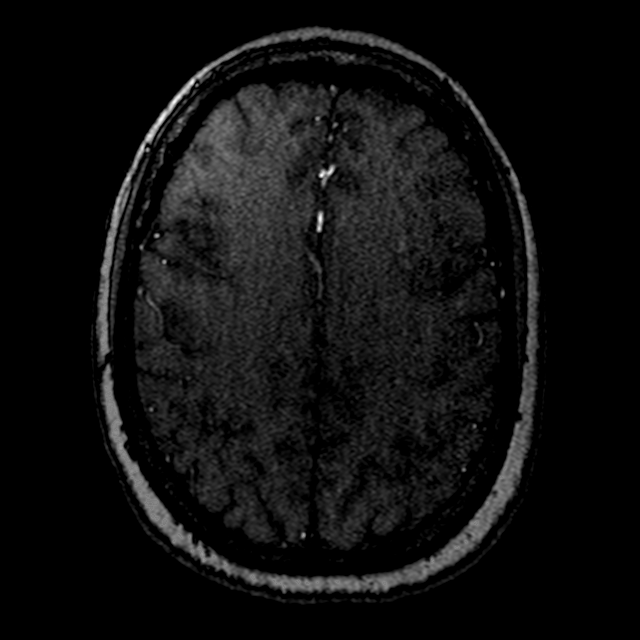

[13 of 48 positions shown; findings below may reference images not displayed]

FINDINGS: Both vertebral arteries patent to the basilar without stenosis. PICA
patent bilaterally. Superior cerebellar and posterior cerebral
arteries patent bilaterally without stenosis or vascular
malformation. Basilar artery widely patent

Internal carotid artery widely patent and appears normal. Anterior
and middle cerebral arteries widely patent without stenosis.

Prominent vessels around the MCA bifurcation bilateral likely are
normal veins.

There is a 2 cm outpouching of the right M1 segment projecting
superiorly consistent with a small aneurysm. No other aneurysm.
IMPRESSION: 2 mm aneurysm right M1 segment.  Otherwise negative.

## 2017-08-08 DIAGNOSIS — E119 Type 2 diabetes mellitus without complications: Secondary | ICD-10-CM | POA: Diagnosis not present

## 2017-08-11 ENCOUNTER — Ambulatory Visit
Admission: RE | Admit: 2017-08-11 | Discharge: 2017-08-11 | Disposition: A | Discharge status: Home / Self Care | Payer: Medicare Other | Source: Ambulatory Visit | Attending: Sports Medicine | Admitting: Sports Medicine

## 2017-08-11 DIAGNOSIS — M542 Cervicalgia: Secondary | ICD-10-CM

## 2017-08-11 DIAGNOSIS — M4802 Spinal stenosis, cervical region: Secondary | ICD-10-CM | POA: Diagnosis not present

## 2017-08-11 DIAGNOSIS — M5021 Other cervical disc displacement,  high cervical region: Secondary | ICD-10-CM | POA: Diagnosis not present

## 2017-08-13 DIAGNOSIS — M47812 Spondylosis without myelopathy or radiculopathy, cervical region: Secondary | ICD-10-CM | POA: Diagnosis not present

## 2017-08-24 ENCOUNTER — Other Ambulatory Visit: Admitting: Adult Health

## 2017-09-02 DIAGNOSIS — M5030 Other cervical disc degeneration, unspecified cervical region: Secondary | ICD-10-CM | POA: Diagnosis not present

## 2017-09-02 DIAGNOSIS — M542 Cervicalgia: Secondary | ICD-10-CM | POA: Diagnosis not present

## 2017-09-02 DIAGNOSIS — M5412 Radiculopathy, cervical region: Secondary | ICD-10-CM | POA: Diagnosis not present

## 2017-09-09 ENCOUNTER — Encounter: Payer: Self-pay | Admitting: Adult Health

## 2017-09-09 ENCOUNTER — Ambulatory Visit (INDEPENDENT_AMBULATORY_CARE_PROVIDER_SITE_OTHER): Payer: Medicare Other | Admitting: Adult Health

## 2017-09-09 VITALS — BP 140/80 | HR 96 | Ht 66.0 in | Wt 234.0 lb

## 2017-09-09 DIAGNOSIS — Z1212 Encounter for screening for malignant neoplasm of rectum: Secondary | ICD-10-CM

## 2017-09-09 DIAGNOSIS — M25512 Pain in left shoulder: Secondary | ICD-10-CM | POA: Diagnosis not present

## 2017-09-09 DIAGNOSIS — Z01411 Encounter for gynecological examination (general) (routine) with abnormal findings: Secondary | ICD-10-CM | POA: Diagnosis not present

## 2017-09-09 DIAGNOSIS — N8110 Cystocele, unspecified: Secondary | ICD-10-CM | POA: Diagnosis not present

## 2017-09-09 DIAGNOSIS — Z01419 Encounter for gynecological examination (general) (routine) without abnormal findings: Secondary | ICD-10-CM

## 2017-09-09 DIAGNOSIS — Z1211 Encounter for screening for malignant neoplasm of colon: Secondary | ICD-10-CM | POA: Diagnosis not present

## 2017-09-09 DIAGNOSIS — M25511 Pain in right shoulder: Secondary | ICD-10-CM

## 2017-09-09 LAB — HEMOCCULT GUIAC POC 1CARD (OFFICE): Fecal Occult Blood, POC: NEGATIVE

## 2017-09-09 NOTE — Progress Notes (Signed)
Patient ID: Cheryl Ortiz, female   DOB: August 27, 1950, 67 y.o.   MRN: [id number] History of Present Illness: Cheryl Ortiz is a 67 year old black female, widowed,sp hysterectomy, in for a well woman gyn exam.She has female friend,but no sex yet.  PCP is Dr Hilma Favors.    Current Medications, Allergies, Past Medical History, Past Surgical History, Family History and Social History were reviewed in [employer] record.     Review of Systems: Patient denies any headaches, hearing loss, fatigue, blurred vision, shortness of breath, chest pain, abdominal pain, problems with bowel movements, urination, or intercourse(no currently). No joint pain or mood swings. +pain in shoulders, seeing Weiner/Murphy, to get injections in May.    Physical Exam:BP 140/80 (BP Location: Right Arm, Patient Position: Sitting, Cuff Size: Small)   Pulse 96   Ht 5\' 6"  (1.676 m)   Wt 234 lb (106.1 kg)   BMI 37.77 kg/m  General:  Well developed, well nourished, no acute distress Skin:  Warm and dry Neck:  Midline trachea, normal thyroid, good ROM, no lymphadenopathy,no carotid bruits heard Lungs; Clear to auscultation bilaterally Breast:  No dominant palpable mass, retraction, or nipple discharge Cardiovascular: Regular rate and rhythm Abdomen:  Soft, non tender, no hepatosplenomegaly Pelvic:  External genitalia is normal in appearance, no lesions.  The vagina is pale with loss of moisture and ruge.+cystocele. Urethra has no lesions or masses. The cervix and uterus are absent. No adnexal masses or tenderness noted.Bladder is non tender, no masses felt. Rectal: Good sphincter tone, no polyps, or hemorrhoids felt.  Hemoccult negative. Extremities/musculoskeletal:  No swelling or varicosities noted, no clubbing or cyanosis Psych:  No mood changes, alert and cooperative,seems happy PHQ 9 score 2.   Impression:  1. Encounter for well woman exam with routine gynecological exam   2. Screening for  colorectal cancer   3. Female cystocele      Plan: Physical in 2 years Mammogram yearly Labs with PCP  Colonoscopy per GI

## 2017-09-23 ENCOUNTER — Ambulatory Visit (INDEPENDENT_AMBULATORY_CARE_PROVIDER_SITE_OTHER): Payer: Medicare Other | Admitting: Internal Medicine

## 2017-09-23 ENCOUNTER — Encounter (INDEPENDENT_AMBULATORY_CARE_PROVIDER_SITE_OTHER): Payer: Self-pay | Admitting: Internal Medicine

## 2017-09-23 VITALS — BP 160/90 | HR 72 | Temp 97.8°F | Ht 66.0 in | Wt 231.8 lb

## 2017-09-23 DIAGNOSIS — R197 Diarrhea, unspecified: Secondary | ICD-10-CM | POA: Diagnosis not present

## 2017-09-23 DIAGNOSIS — R109 Unspecified abdominal pain: Secondary | ICD-10-CM | POA: Diagnosis not present

## 2017-09-23 MED ORDER — DICYCLOMINE HCL 10 MG PO CAPS: 10.0000 mg | ORAL_CAPSULE | Freq: Three times a day (TID) | ORAL | 1 refills | Status: DC

## 2017-09-23 NOTE — Patient Instructions (Addendum)
GI pathogen. Rx for Dicyclomine sent to her pharmacy  

## 2017-09-23 NOTE — Progress Notes (Signed)
Subjective:    Patient ID: Cheryl Ortiz, female    DOB: January 15, 1951, 67 y.o.   MRN: [id number]  HPI Here today for f/u. Last seen in 2017.  She was having rectal pain and nausea. CT scan 2017 showed no acute findings. She says she has cramps in her stomach. She says she is having diarrhea. Having 3 loose stools a day. No recent antibiotics. Symptoms for about 2 weeks.  When she eats foods go straight thru her. All of her stools diarrhea. She has not been on any recent antibiotics.  06/16/2015 Colonoscopy: Hx of colonic adenomas on prior colonoscopies in 2004 and 2010.  Impression:  Examination performed to cecum. Single small cecal erosion felt to be secondary to NSAID use. Few scattered diverticula throughout the colon. Small polyp at sigmoid colon ablated via cold biopsy and residual polyp coagulated dyspnea tip. Two small polyps hot snared from distal rectum and submitted together. Three small rectal polyps were coagulated using snare tip.   Review of Systems Past Medical History:  Diagnosis Date  . 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 Cold   . Cystocele 07/12/2015  . Hyperlipidemia   . Hypertension   . Neuropathy of both feet    tingling/numbness bilateral feet ,rt arm,hands-nerve study scheduled  . Osteoarthritis   . Renal disorder    kidney stones  . Seasonal allergies   . Type 2 diabetes mellitus ([place])     Past Surgical History:  Procedure Laterality Date  . CHOLECYSTECTOMY  1999  . COLONOSCOPY N/A 06/15/2014   Procedure: COLONOSCOPY;  Surgeon: Rogene Houston, MD;  [facility];  Service: Endoscopy;  Laterality: N/A;  1145  . CYSTOSCOPY WITH RETROGRADE PYELOGRAM, URETEROSCOPY AND STENT PLACEMENT Bilateral 08/20/2015   Procedure: CYSTOSCOPY WITH RETROGRADE PYELOGRAM, URETEROSCOPY AND STENT PLACEMENT WITH STONE EXTRACTION WITH BASKET;  Surgeon: Cleon Gustin, MD;  Location: [facility];  Service: Urology;   Laterality: Bilateral;  . ESOPHAGEAL DILATION N/A 03/23/2015   Procedure: ESOPHAGEAL DILATION;  Surgeon: Rogene Houston, MD;  [facility];  Service: Endoscopy;  Laterality: N/A;  . ESOPHAGOGASTRODUODENOSCOPY N/A 03/23/2015   Procedure: ESOPHAGOGASTRODUODENOSCOPY (EGD);  Surgeon: Rogene Houston, MD;  [facility];  Service: Endoscopy;  Laterality: N/A;  8:55 - moved to 12:35 - Ann to notify pt  . FOOT SURGERY Bilateral   . HOLMIUM LASER APPLICATION Bilateral 7/0/2637   Procedure: HOLMIUM LASER APPLICATION;  Surgeon: Cleon Gustin, MD;  Location: [facility];  Service: Urology;  Laterality: Bilateral;  . VAGINAL HYSTERECTOMY  1984    Allergies  Allergen Reactions  . Codeine Other (See Comments)    Sensitivity, "makes high"  . Prednisone Other (See Comments)    High blood sugar.    Current Outpatient Medications on File Prior to Visit  Medication Sig Dispense Refill  . Albuterol (PROVENTIL IN) Inhale 2 Inhalers into the lungs 2 (two) times daily.    Marland Kitchen aspirin 81 MG tablet Take 81 mg by mouth daily.    . metFORMIN (GLUCOPHAGE) 500 MG tablet Take 1,000 mg by mouth 2 (two) times daily with a meal.     . OVER THE COUNTER MEDICATION Take 1 tablet by mouth daily. HERBAL SUPPLEMENT: Iron, Calcium, Vitamin C and E (for menstruation and menopause)     No current facility-administered medications on file prior to visit.         Objective:   Physical  Exam Blood pressure (!) 160/90, pulse 72, temperature 97.8 F (36.6 C), height 5\' 6"  (1.676 m), weight 231 lb 12.8 oz (105.1 kg). Alert and oriented. Skin warm and dry. Oral mucosa is moist.   . Sclera anicteric, conjunctivae is pink. Thyroid not enlarged. No cervical lymphadenopathy. Lungs clear. Heart regular rate and rhythm.  Abdomen is soft. Bowel sounds are positive. No hepatomegaly. No abdominal masses felt. No tenderness.  No edema to lower extremities.        Assessment & Plan:  Abdominal  cramping. Am going to get a GI pathogen. Rx for Dicyclomine 10mg  TID sent to her pharmacy.

## 2017-09-24 DIAGNOSIS — R197 Diarrhea, unspecified: Secondary | ICD-10-CM | POA: Diagnosis not present

## 2017-09-24 DIAGNOSIS — R109 Unspecified abdominal pain: Secondary | ICD-10-CM | POA: Diagnosis not present

## 2017-09-25 DIAGNOSIS — M542 Cervicalgia: Secondary | ICD-10-CM | POA: Diagnosis not present

## 2017-09-25 DIAGNOSIS — M5412 Radiculopathy, cervical region: Secondary | ICD-10-CM | POA: Diagnosis not present

## 2017-09-25 DIAGNOSIS — M5030 Other cervical disc degeneration, unspecified cervical region: Secondary | ICD-10-CM | POA: Diagnosis not present

## 2017-09-25 LAB — GASTROINTESTINAL PATHOGEN PANEL PCR
C. difficile Tox A/B, PCR: NOT DETECTED
Campylobacter, PCR: NOT DETECTED
Cryptosporidium, PCR: NOT DETECTED
E coli (ETEC) LT/ST PCR: NOT DETECTED
E coli (STEC) stx1/stx2, PCR: NOT DETECTED
E coli 0157, PCR: NOT DETECTED
Giardia lamblia, PCR: NOT DETECTED
Norovirus, PCR: NOT DETECTED
Rotavirus A, PCR: NOT DETECTED
Salmonella, PCR: NOT DETECTED
Shigella, PCR: NOT DETECTED

## 2017-10-13 DIAGNOSIS — M542 Cervicalgia: Secondary | ICD-10-CM | POA: Diagnosis not present

## 2017-10-13 DIAGNOSIS — M5030 Other cervical disc degeneration, unspecified cervical region: Secondary | ICD-10-CM | POA: Diagnosis not present

## 2017-10-13 DIAGNOSIS — M5412 Radiculopathy, cervical region: Secondary | ICD-10-CM | POA: Diagnosis not present

## 2017-10-30 DIAGNOSIS — F320 Major depressive disorder, single episode, mild: Secondary | ICD-10-CM | POA: Diagnosis not present

## 2017-10-30 DIAGNOSIS — E119 Type 2 diabetes mellitus without complications: Secondary | ICD-10-CM | POA: Diagnosis not present

## 2017-10-30 DIAGNOSIS — I10 Essential (primary) hypertension: Secondary | ICD-10-CM | POA: Diagnosis not present

## 2017-10-30 DIAGNOSIS — Z681 Body mass index (BMI) 19 or less, adult: Secondary | ICD-10-CM | POA: Diagnosis not present

## 2017-10-30 DIAGNOSIS — E039 Hypothyroidism, unspecified: Secondary | ICD-10-CM | POA: Diagnosis not present

## 2017-10-30 DIAGNOSIS — E559 Vitamin D deficiency, unspecified: Secondary | ICD-10-CM | POA: Diagnosis not present

## 2017-10-30 DIAGNOSIS — Z01411 Encounter for gynecological examination (general) (routine) with abnormal findings: Secondary | ICD-10-CM | POA: Diagnosis not present

## 2017-10-30 DIAGNOSIS — E785 Hyperlipidemia, unspecified: Secondary | ICD-10-CM | POA: Diagnosis not present

## 2017-10-30 DIAGNOSIS — E782 Mixed hyperlipidemia: Secondary | ICD-10-CM | POA: Diagnosis not present

## 2017-10-30 DIAGNOSIS — Z0000 Encounter for general adult medical examination without abnormal findings: Secondary | ICD-10-CM | POA: Diagnosis not present

## 2017-10-30 DIAGNOSIS — Z1389 Encounter for screening for other disorder: Secondary | ICD-10-CM | POA: Diagnosis not present

## 2017-11-06 ENCOUNTER — Other Ambulatory Visit (HOSPITAL_COMMUNITY): Payer: Self-pay | Admitting: Family Medicine

## 2017-11-06 DIAGNOSIS — Z1231 Encounter for screening mammogram for malignant neoplasm of breast: Secondary | ICD-10-CM

## 2017-12-01 DIAGNOSIS — R768 Other specified abnormal immunological findings in serum: Secondary | ICD-10-CM | POA: Diagnosis not present

## 2017-12-01 DIAGNOSIS — M25561 Pain in right knee: Secondary | ICD-10-CM | POA: Diagnosis not present

## 2017-12-01 DIAGNOSIS — M791 Myalgia, unspecified site: Secondary | ICD-10-CM | POA: Diagnosis not present

## 2017-12-01 DIAGNOSIS — M2550 Pain in unspecified joint: Secondary | ICD-10-CM | POA: Diagnosis not present

## 2017-12-01 DIAGNOSIS — M1712 Unilateral primary osteoarthritis, left knee: Secondary | ICD-10-CM | POA: Diagnosis not present

## 2017-12-01 DIAGNOSIS — M1711 Unilateral primary osteoarthritis, right knee: Secondary | ICD-10-CM | POA: Diagnosis not present

## 2017-12-03 DIAGNOSIS — I671 Cerebral aneurysm, nonruptured: Secondary | ICD-10-CM | POA: Diagnosis not present

## 2017-12-03 DIAGNOSIS — I10 Essential (primary) hypertension: Secondary | ICD-10-CM | POA: Diagnosis not present

## 2017-12-03 DIAGNOSIS — Z6836 Body mass index (BMI) 36.0-36.9, adult: Secondary | ICD-10-CM | POA: Diagnosis not present

## 2017-12-03 DIAGNOSIS — G5601 Carpal tunnel syndrome, right upper limb: Secondary | ICD-10-CM | POA: Diagnosis not present

## 2017-12-03 DIAGNOSIS — G5602 Carpal tunnel syndrome, left upper limb: Secondary | ICD-10-CM | POA: Diagnosis not present

## 2017-12-09 ENCOUNTER — Ambulatory Visit (INDEPENDENT_AMBULATORY_CARE_PROVIDER_SITE_OTHER): Payer: Medicare Other | Admitting: Podiatry

## 2017-12-09 ENCOUNTER — Encounter: Payer: Self-pay | Admitting: Podiatry

## 2017-12-09 ENCOUNTER — Ambulatory Visit (INDEPENDENT_AMBULATORY_CARE_PROVIDER_SITE_OTHER): Payer: Medicare Other

## 2017-12-09 VITALS — BP 112/68 | HR 84 | Resp 16

## 2017-12-09 DIAGNOSIS — M5412 Radiculopathy, cervical region: Secondary | ICD-10-CM | POA: Diagnosis not present

## 2017-12-09 DIAGNOSIS — E1140 Type 2 diabetes mellitus with diabetic neuropathy, unspecified: Secondary | ICD-10-CM | POA: Diagnosis not present

## 2017-12-09 DIAGNOSIS — M9981 Other biomechanical lesions of cervical region: Secondary | ICD-10-CM | POA: Diagnosis not present

## 2017-12-09 DIAGNOSIS — E1149 Type 2 diabetes mellitus with other diabetic neurological complication: Secondary | ICD-10-CM | POA: Diagnosis not present

## 2017-12-09 DIAGNOSIS — R202 Paresthesia of skin: Secondary | ICD-10-CM | POA: Diagnosis not present

## 2017-12-09 DIAGNOSIS — M21619 Bunion of unspecified foot: Secondary | ICD-10-CM

## 2017-12-09 DIAGNOSIS — M21611 Bunion of right foot: Secondary | ICD-10-CM | POA: Diagnosis not present

## 2017-12-09 DIAGNOSIS — L84 Corns and callosities: Secondary | ICD-10-CM | POA: Diagnosis not present

## 2017-12-09 DIAGNOSIS — R200 Anesthesia of skin: Secondary | ICD-10-CM | POA: Diagnosis not present

## 2017-12-09 NOTE — Progress Notes (Signed)
Subjective:   Patient ID: Cheryl Ortiz, female   DOB: 67 y.o.   MRN: [id number]   HPI Patient presents stating she has had bunion surgery in the past and has occasional problems with the left one and has significant keratotic lesion on the right big toe that is been bothersome for her and makes it hard to walk.  She has a long-term history of diabetes recently under better control but has had trouble with that over the years.  Patient does not smoke likes to be active   Review of Systems  All other systems reviewed and are negative.       Objective:  Physical Exam  Constitutional: She appears well-developed and well-nourished.  Cardiovascular: Intact distal pulses.  Pulmonary/Chest: Effort normal.  Musculoskeletal: Normal range of motion.  Neurological: She is alert.  Skin: Skin is warm.  Nursing note and vitals reviewed.   Vascular status found to be intact with patient neurologically showing diminishment of sharp dull vibratory with a large keratotic lesion right hallux medial side mild elevation of the digits with structural bunion deformity with history of surgery that has been successful right over left foot     Assessment:  At risk diabetic with keratotic lesion right hallux that is hard for her to take care of and she is been to previous doctors for debridement but is never had padding with structural alignment still off with the lesser digits     Plan:  H&P conditions reviewed and at this point sharp sterile debridement of the lesion right accomplished with padding and I advised this will need to be done and I do not recommend surgery unless it gets worse  X-ray right indicates pins in place joint congruence from previous surgery with mild deviation of the right hallux

## 2017-12-09 NOTE — Progress Notes (Signed)
   Subjective:    Patient ID: Cheryl Ortiz, female    DOB: 04-09-1951, 66 y.o.   MRN: [id number]  HPI    Review of Systems  All other systems reviewed and are negative.      Objective:   Physical Exam        Assessment & Plan:

## 2017-12-10 ENCOUNTER — Ambulatory Visit (HOSPITAL_COMMUNITY)
Admission: RE | Admit: 2017-12-10 | Discharge: 2017-12-10 | Disposition: A | Discharge status: Home / Self Care | Payer: Medicare Other | Source: Ambulatory Visit | Attending: Family Medicine | Admitting: Family Medicine

## 2017-12-10 DIAGNOSIS — Z1231 Encounter for screening mammogram for malignant neoplasm of breast: Secondary | ICD-10-CM | POA: Insufficient documentation

## 2017-12-22 DIAGNOSIS — I10 Essential (primary) hypertension: Secondary | ICD-10-CM | POA: Diagnosis not present

## 2017-12-22 DIAGNOSIS — I671 Cerebral aneurysm, nonruptured: Secondary | ICD-10-CM | POA: Diagnosis not present

## 2017-12-22 DIAGNOSIS — Z6836 Body mass index (BMI) 36.0-36.9, adult: Secondary | ICD-10-CM | POA: Diagnosis not present

## 2017-12-23 DIAGNOSIS — M1712 Unilateral primary osteoarthritis, left knee: Secondary | ICD-10-CM | POA: Diagnosis not present

## 2017-12-23 DIAGNOSIS — M25561 Pain in right knee: Secondary | ICD-10-CM | POA: Diagnosis not present

## 2017-12-25 ENCOUNTER — Other Ambulatory Visit: Payer: Self-pay | Admitting: Neurosurgery

## 2017-12-25 DIAGNOSIS — I671 Cerebral aneurysm, nonruptured: Secondary | ICD-10-CM

## 2017-12-28 ENCOUNTER — Ambulatory Visit
Admission: RE | Admit: 2017-12-28 | Discharge: 2017-12-28 | Disposition: A | Discharge status: Home / Self Care | Payer: Medicare Other | Source: Ambulatory Visit | Attending: Neurosurgery | Admitting: Neurosurgery

## 2017-12-28 DIAGNOSIS — I671 Cerebral aneurysm, nonruptured: Secondary | ICD-10-CM | POA: Diagnosis not present

## 2017-12-28 MED ORDER — IOPAMIDOL (ISOVUE-370) INJECTION 76%
75.0000 mL | Freq: Once | INTRAVENOUS | Status: AC | PRN
  Administered 2017-12-28: 75 mL via INTRAVENOUS

## 2017-12-31 DIAGNOSIS — Z6835 Body mass index (BMI) 35.0-35.9, adult: Secondary | ICD-10-CM | POA: Diagnosis not present

## 2017-12-31 DIAGNOSIS — I10 Essential (primary) hypertension: Secondary | ICD-10-CM | POA: Diagnosis not present

## 2017-12-31 DIAGNOSIS — I671 Cerebral aneurysm, nonruptured: Secondary | ICD-10-CM | POA: Diagnosis not present

## 2018-01-15 DIAGNOSIS — N200 Calculus of kidney: Secondary | ICD-10-CM | POA: Diagnosis not present

## 2018-03-01 ENCOUNTER — Encounter: Payer: Self-pay | Admitting: Neurology

## 2018-03-01 ENCOUNTER — Ambulatory Visit (INDEPENDENT_AMBULATORY_CARE_PROVIDER_SITE_OTHER): Payer: Medicare Other | Admitting: Neurology

## 2018-03-01 VITALS — BP 137/77 | HR 78 | Ht 66.0 in | Wt 225.0 lb

## 2018-03-01 DIAGNOSIS — E538 Deficiency of other specified B group vitamins: Secondary | ICD-10-CM

## 2018-03-01 DIAGNOSIS — R413 Other amnesia: Secondary | ICD-10-CM | POA: Diagnosis not present

## 2018-03-01 DIAGNOSIS — G479 Sleep disorder, unspecified: Secondary | ICD-10-CM | POA: Diagnosis not present

## 2018-03-01 NOTE — Progress Notes (Signed)
Reason for visit: Mild memory disturbance  Referring physician: Dr. [name] is a 67 y.o. female  History of present illness:  Cheryl Ortiz is a 67 year old right-handed black female with a history of some mild memory changes.  Cheryl Ortiz claims that Cheryl Ortiz mother has dementia, but she has just recently in Cheryl last year developed some mild troubles with forgetfulness.  Cheryl Ortiz is functioning at a very high level, she is able to manage Cheryl Ortiz medications, appointments, she operates a motor vehicle without difficulty.  She has no problems managing Cheryl finances.  She works part-time, she has no problems with this.  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  Cheryl Ortiz claims that she sleeps at night but she does have some fatigue during Cheryl day.  She believes that there has been a parallel between Cheryl fatigue and Cheryl Ortiz memory issues.  Cheryl Ortiz has been told that she does snore.  Cheryl Ortiz lives alone, however.  She indicates that she may go to Cheryl grocery store and forget to bring something home, or forget where she put Cheryl Ortiz car keys.  Cheryl Ortiz notes some numbness in Cheryl hands at times and from Cheryl knees down she feels somewhat cold, a recent nerve conduction study was unremarkable.  Cheryl Ortiz does have some mild imbalance, she has not had any falls.  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  Cheryl Ortiz reports no problems controlling Cheryl bowels or Cheryl bladder.  She denies any dizziness, she may have a headache on occasion.  She is sent to this office for an evaluation.  Past Medical History:  Diagnosis Date  . 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 Cold   . Cystocele 07/12/2015  . Hyperlipidemia   . Hypertension   .  Neuropathy of both feet    tingling/numbness bilateral feet ,rt arm,hands-nerve study scheduled  . Osteoarthritis   . Renal disorder    kidney stones  . Seasonal allergies   . Type 2 diabetes mellitus ([place])     Past Surgical History:  Procedure Laterality Date  . CHOLECYSTECTOMY  1999  . COLONOSCOPY N/A 06/15/2014   Procedure: COLONOSCOPY;  Surgeon: Rogene Houston, MD;  [facility];  Service: Endoscopy;  Laterality: N/A;  1145  . CYSTOSCOPY WITH RETROGRADE PYELOGRAM, URETEROSCOPY AND STENT PLACEMENT Bilateral 08/20/2015   Procedure: CYSTOSCOPY WITH RETROGRADE PYELOGRAM, URETEROSCOPY AND STENT PLACEMENT WITH STONE EXTRACTION WITH BASKET;  Surgeon: Cleon Gustin, MD;  Location: [facility];  Service: Urology;  Laterality: Bilateral;  . ESOPHAGEAL DILATION N/A 03/23/2015   Procedure: ESOPHAGEAL DILATION;  Surgeon: Rogene Houston, MD;  [facility];  Service: Endoscopy;  Laterality: N/A;  . ESOPHAGOGASTRODUODENOSCOPY N/A 03/23/2015   Procedure: ESOPHAGOGASTRODUODENOSCOPY (EGD);  Surgeon: Rogene Houston, MD;  [facility];  Service: Endoscopy;  Laterality: N/A;  8:55 - moved to 12:35 - Ann to notify pt  . FOOT SURGERY Bilateral   . HOLMIUM LASER APPLICATION Bilateral 05/28/5091   Procedure: HOLMIUM LASER APPLICATION;  Surgeon: Cleon Gustin, MD;  [facility];  Service: Urology;  Laterality: Bilateral;  . VAGINAL HYSTERECTOMY  1984    Family History  Problem Relation Age of Onset  . Hypertension Mother   . Arthritis Mother   . Cancer Mother        breast   . Cerebral palsy Brother   . Early death Brother   . Hypertension Brother   . Heart disease Brother   . Pneumonia Father   . Hypertension Father   . Hyperlipidemia Sister   . Fibroids Sister   . Hypertension Brother   . Heart attack Brother     Social history:  reports that she quit smoking about 20 years ago. Cheryl Ortiz smoking use included cigarettes.  She has a 6.00 pack-year smoking history. She has never used smokeless tobacco. She reports that she does not drink alcohol or use drugs.  Medications:  Prior to Admission medications   Medication Sig Start Date End Date Taking? Authorizing Provider  Albuterol (PROVENTIL IN) Inhale 2 Inhalers into Cheryl lungs 2 (two) times daily.   Yes [provider]  aspirin 81 MG tablet Take 81 mg by mouth daily.   Yes [provider]  hydrochlorothiazide (HYDRODIURIL) 25 MG tablet Take 25 mg by mouth daily. 10/30/17  Yes [provider]  metFORMIN (GLUCOPHAGE) 500 MG tablet Take 1,000 mg by mouth 2 (two) times daily with a meal.    Yes [provider]  Multiple Vitamin (MULTI-VITAMINS) TABS    Yes [provider]  OVER Cheryl COUNTER MEDICATION Take 1 tablet by mouth daily. HERBAL SUPPLEMENT: Iron, Calcium, Vitamin C and E (for menstruation and menopause)   Yes [provider]      Allergies  Allergen Reactions  . Codeine Other (See Comments)    Sensitivity, "makes high"  . Prednisone Other (See Comments)    High blood sugar.    ROS:  Out of a complete 14 system review of symptoms, Cheryl Ortiz complains only of Cheryl following symptoms, and all other reviewed systems are negative.  Fatigue Ringing in Cheryl ears, difficulty swallowing Itching Shortness of breath, wheezing Joint pain, aching muscles Skin sensitivity Memory loss, confusion, headache, numbness, weakness Not enough sleep, decreased energy Sleepiness, restless legs, snoring  Blood pressure 137/77, pulse 78, height 5\' 6"  (1.676 m), weight 225 lb (102.1 kg).  Physical Exam  General: Cheryl Ortiz is alert and cooperative at Cheryl time of Cheryl examination.  Cheryl Ortiz is moderately to markedly obese.  Eyes: Pupils are equal, round, and reactive to light. Discs are flat bilaterally.  Neck: Cheryl neck is supple, no carotid bruits are noted.  Respiratory: Cheryl respiratory examination is  clear.  Cardiovascular: Cheryl cardiovascular examination reveals a regular rate and rhythm, no obvious murmurs or rubs are noted.  Skin: Extremities are without significant edema.  Neurologic Exam  Mental status: Cheryl Ortiz is alert and oriented x 3 at Cheryl time of Cheryl examination. Cheryl Ortiz has apparent normal recent and remote memory, with an apparently normal attention span and concentration ability.  Mini-Mental status examination done today shows a total score of 27/30.  Cheryl Ortiz is able to name 10 four-legged animals in 30 seconds.  Cranial nerves: Facial symmetry is present. There is good sensation of Cheryl face to pinprick and soft touch bilaterally. Cheryl strength of Cheryl facial muscles and Cheryl muscles to head turning and shoulder shrug are normal bilaterally. Speech is well enunciated, no aphasia or dysarthria is noted. Extraocular movements are full. Visual fields are full. Cheryl tongue is midline, and Cheryl Ortiz has symmetric elevation of Cheryl soft palate. No obvious hearing deficits are noted.  Motor: Cheryl motor testing reveals 5 over 5 strength of all 4 extremities. Good symmetric motor tone is noted throughout.  Sensory: Sensory testing is intact to pinprick, soft touch, vibration sensation, and position sense on all 4 extremities. No evidence of extinction is noted.  Coordination: Cerebellar testing reveals good finger-nose-finger and heel-to-shin bilaterally.  Gait and station: Gait is normal. Tandem gait is normal. Romberg is negative. No drift is seen.  Reflexes: Deep tendon reflexes are symmetric and normal bilaterally. Toes are downgoing bilaterally.   Assessment/Plan:  1.  Mild memory disturbance  2.  Fatigue  Cheryl Ortiz will be sent for blood work today.  She does report a parallel between Cheryl Ortiz fatigue and Cheryl Ortiz mild memory disturbance.  Cheryl Ortiz will be set up for sleep evaluation to exclude Cheryl possibility of sleep apnea.  She will follow-up in about 6 months.  Jill Alexanders MD 03/01/2018 11:58 AM  [facility] [address] [place], [place] [address]  Phone [phone] Fax [phone]

## 2018-03-02 LAB — VITAMIN B12: Vitamin B-12: 607 pg/mL (ref 232–1245)

## 2018-03-02 LAB — RPR: RPR Ser Ql: NONREACTIVE

## 2018-03-02 LAB — HIV ANTIBODY (ROUTINE TESTING W REFLEX): HIV Screen 4th Generation wRfx: NONREACTIVE

## 2018-03-02 LAB — SEDIMENTATION RATE: Sed Rate: 7 mm/hr (ref 0–40)

## 2018-03-11 DIAGNOSIS — J4520 Mild intermittent asthma, uncomplicated: Secondary | ICD-10-CM | POA: Diagnosis not present

## 2018-03-11 DIAGNOSIS — E782 Mixed hyperlipidemia: Secondary | ICD-10-CM | POA: Diagnosis not present

## 2018-03-11 DIAGNOSIS — I10 Essential (primary) hypertension: Secondary | ICD-10-CM | POA: Diagnosis not present

## 2018-03-11 DIAGNOSIS — Z23 Encounter for immunization: Secondary | ICD-10-CM | POA: Diagnosis not present

## 2018-03-11 DIAGNOSIS — E119 Type 2 diabetes mellitus without complications: Secondary | ICD-10-CM | POA: Diagnosis not present

## 2018-03-11 DIAGNOSIS — Z6835 Body mass index (BMI) 35.0-35.9, adult: Secondary | ICD-10-CM | POA: Diagnosis not present

## 2018-03-12 DIAGNOSIS — J4520 Mild intermittent asthma, uncomplicated: Secondary | ICD-10-CM | POA: Diagnosis not present

## 2018-03-12 DIAGNOSIS — Z6835 Body mass index (BMI) 35.0-35.9, adult: Secondary | ICD-10-CM | POA: Diagnosis not present

## 2018-03-12 DIAGNOSIS — E119 Type 2 diabetes mellitus without complications: Secondary | ICD-10-CM | POA: Diagnosis not present

## 2018-03-12 DIAGNOSIS — E6609 Other obesity due to excess calories: Secondary | ICD-10-CM | POA: Diagnosis not present

## 2018-04-21 ENCOUNTER — Ambulatory Visit (INDEPENDENT_AMBULATORY_CARE_PROVIDER_SITE_OTHER): Payer: Medicare Other | Admitting: Neurology

## 2018-04-21 ENCOUNTER — Other Ambulatory Visit: Payer: Self-pay

## 2018-04-21 ENCOUNTER — Encounter: Payer: Self-pay | Admitting: Neurology

## 2018-04-21 VITALS — BP 126/75 | HR 81 | Ht 66.0 in | Wt 224.5 lb

## 2018-04-21 DIAGNOSIS — G4712 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: Secondary | ICD-10-CM | POA: Diagnosis not present

## 2018-04-21 DIAGNOSIS — R413 Other amnesia: Secondary | ICD-10-CM | POA: Diagnosis not present

## 2018-04-21 DIAGNOSIS — E538 Deficiency of other specified B group vitamins: Secondary | ICD-10-CM

## 2018-04-21 DIAGNOSIS — G479 Sleep disorder, unspecified: Secondary | ICD-10-CM

## 2018-04-21 DIAGNOSIS — R0683 Snoring: Secondary | ICD-10-CM

## 2018-04-21 NOTE — Progress Notes (Signed)
SLEEP MEDICINE CLINIC   Provider:  Larey Seat, MD   Primary Care Physician:  Cheryl Sites, MD   Referring Provider: Dr.Willis, [facility]   Chief Complaint  Patient presents with  . New Patient (Initial Visit)    RM 10, alone. Internal referral from Dr. Jannifer Ortiz for sleep disorder. Has not had prior sleep study. No family hx or personal hx sleep apnea. Dr. Jannifer Ortiz wanted to r/o sleep apnea d/t memory issues and fatigue.    HPI:  Cheryl Ortiz is a 67 y.o. female , seen here as in a referral  from Dr. Jannifer Ortiz for sleep evaluation.   Chief complaint according to patient : " caregiver burden and grieving " .  [name] is a 67 year old African-American right-handed female patient of Dr. Jannifer Ortiz, who reports that the years 2018- 2019 had been very hard on her. It began with her mother being diagnosed with breast cancer in 2018, she became a caregiver. Her mother had to move to a care facility.  Her brother ,who is blind and deaf, has been under her care ever since.  She also lost 2 brothers in Nov 2018, and in 01-2018.  In short, she was left with a previously unknown amount of caregiver burden and still grieving the loss of several close family members. She is less concerned about the presence of sleep apnea as she feels that her fatigue and sleepiness previously was explained by her 24-7 role of caregiving, and expected fatigue. She has never been evaluated for sleep.     Sleep habits are as follows: Dinner is any time- her brother's meal will be prepared at 3.30 Pm- her own can be anytime between 4-8 PM. She cleans the house, preps meals, does laundry. 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  Bedtime is between 9-10 PM on weekdays. She is watching Tv in her bed, wakes usually at 4 AM and the TV is still running.  She usually is asleep within 2 hours , after being on the phone , playing games on her mobile phone.  Her bedroom has lots of light and sounds.  Computer is also located  in her bedroom.  Nocturia times one. Sleeps on her right side, on 3 pillows, one for head and 2 for body support.  Rises at 5 AM Monday- Friday- and 7 AM on weekends. Sleep time of 5.5 hours on average. She feels refreshed, no dry mouth, no headaches in AM.  Her children have told her she snores. Some afternoons she can be found napping- .    Sleep medical history :  quoted from Dr. Jannifer Ortiz' notes : Ms. Ortiz is a 67 year old right-handed black female with a history of some mild memory changes.  The patient claims that her mother has dementia, but she has just recently in the last year developed some mild troubles with forgetfulness.  The patient is functioning at a very high level, she is able to manage her medications, appointments, she operates a motor vehicle without difficulty.  She has no problems managing the finances.  She works part-time, she has no problems with this.  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  The patient claims that she sleeps at night but she does have some fatigue during the day.  She believes that there has been a parallel between the fatigue and her memory  issues.  The patient has been told that she does snore.  The patient lives alone, however.  She indicates that she may go to the grocery store and forget to bring something home, or forget where she put her car keys.   The patient notes some numbness in the hands at times and from the knees down she feels somewhat cold, a recent nerve conduction study was unremarkable.  The patient does have some mild imbalance, she has not had any falls.   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   The patient reports no problems controlling the bowels or the bladder.  She denies any  dizziness, she may have a headache on occasion.  She is sent to this office for an evaluation.  HTN, Obesity, snoring, sleepiness, dizziness, DM.     Social history: very active in [employer], in her church and caring for several family members. Granddaughter comes to her house every morning and catches the school bus from there.   Review of Systems: Out of a complete 14 system review, the patient complains of only the following symptoms, and all other reviewed systems are negative. No sleep attacks, no insomnia. Poor sleep habits.   Epworth Sleepiness score : 10/ 24 , Fatigue severity score 17  , depression score N/A.    Social History   Socioeconomic History  . Marital status: Widowed    Spouse name: Not on file  . Number of children: Not on file  . Years of education: Not on file  . Highest education level: Not on file  Occupational History  . Not on file  Social Needs  . Financial resource strain: Not on file  . Food insecurity:    Worry: Not on file    Inability: Not on file  . Transportation needs:    Medical: Not on file    Non-medical: Not on file  Tobacco Use  . Smoking status: Former Smoker    Packs/day: 0.50    Years: 12.00    Pack years: 6.00    Types: Cigarettes    Last attempt to quit: 08/15/1997    Years since quitting: 20.6  . Smokeless tobacco: Never Used  Substance and Sexual Activity  . Alcohol use: No    Comment: occ.  . Drug use: No  . Sexual activity: Not Currently    Birth control/protection: Surgical    Comment: hyst  Lifestyle  . Physical activity:    Days per week:     Minutes per session:   . Stress:   Relationships  . Social connections:    Talks on phone:  yes.    Gets together: Yes     Attends religious service: yes    Active member of club or organization: Yes     Attends meetings of clubs or organizations: yes    Relationship status: Single   . Intimate partner violence:    Fear of current or ex partner:      Emotionally abused:     Physically abused:     Forced sexual activity:   Other Topics Concern  . Not on file  Social History Narrative  . Not on file    Family History  Problem Relation Age of Onset  . Hypertension Mother   . Arthritis Mother   . Cancer Mother        breast   .  Cerebral palsy Brother   . Early death Brother   . Hypertension Brother   . Heart disease Brother   . Pneumonia Father   . Hypertension Father   . Hyperlipidemia Sister   . Fibroids Sister   . Hypertension Brother   . Heart attack Brother     Past Medical History:  Diagnosis Date  . Aneurysm ([place]) 06/2015   brain-small will recheck in 1 year  . Cold   . Cystocele 07/12/2015  . Hyperlipidemia   . Hypertension   . Neuropathy of both feet    tingling/numbness bilateral feet ,rt arm,hands-nerve study scheduled  . Osteoarthritis   . Renal disorder    kidney stones  . Seasonal allergies   . Type 2 diabetes mellitus ([place])     Past Surgical History:  Procedure Laterality Date  . CHOLECYSTECTOMY  1999  . COLONOSCOPY N/A 06/15/2014   Procedure: COLONOSCOPY;  Surgeon: Rogene Houston, MD;  [facility];  Service: Endoscopy;  Laterality: N/A;  1145  . CYSTOSCOPY WITH RETROGRADE PYELOGRAM, URETEROSCOPY AND STENT PLACEMENT Bilateral 08/20/2015   Procedure: CYSTOSCOPY WITH RETROGRADE PYELOGRAM, URETEROSCOPY AND STENT PLACEMENT WITH STONE EXTRACTION WITH BASKET;  Surgeon: Cleon Gustin, MD;  Location[facility];  Service: Urology;  Laterality: Bilateral;  . ESOPHAGEAL DILATION N/A 03/23/2015   Procedure: ESOPHAGEAL DILATION;  Surgeon: Rogene Houston, MD;  [facility];  Service: Endoscopy;  Laterality: N/A;  . ESOPHAGOGASTRODUODENOSCOPY N/A 03/23/2015   Procedure: ESOPHAGOGASTRODUODENOSCOPY (EGD);  Surgeon: Rogene Houston, MD;  [facility];  Service: Endoscopy;  Laterality: N/A;  8:55 - moved to 12:35 - Ann to notify pt  . FOOT SURGERY Bilateral   .  HOLMIUM LASER APPLICATION Bilateral 09/16/256   Procedure: HOLMIUM LASER APPLICATION;  Surgeon: Cleon Gustin, MD;  Location: [facility];  Service: Urology;  Laterality: Bilateral;  . VAGINAL HYSTERECTOMY  1984    Current Outpatient Medications  Medication Sig Dispense Refill  . Albuterol (PROVENTIL IN) Inhale 2 Inhalers into the lungs 2 (two) times daily.    Marland Kitchen aspirin 81 MG tablet Take 81 mg by mouth daily.    . hydrochlorothiazide (HYDRODIURIL) 25 MG tablet Take 25 mg by mouth daily.  2  . metFORMIN (GLUCOPHAGE) 500 MG tablet Take 1,000 mg by mouth 2 (two) times daily with a meal.     . Multiple Vitamin (MULTI-VITAMINS) TABS     . OVER THE COUNTER MEDICATION Take 1 tablet by mouth daily. HERBAL SUPPLEMENT: Iron, Calcium, Vitamin C and E (for menstruation and menopause)     No current facility-administered medications for this visit.     Allergies as of 04/21/2018 - Review Complete 04/21/2018  Allergen Reaction Noted  . Codeine Other (See Comments) 02/17/2013  . Prednisone Other (See Comments) 06/09/2014    Vitals: BP 126/75 (BP Location: Right Arm, Patient Position: Sitting, Cuff Size: Normal)   Pulse 81   Ht 5\' 6"  (1.676 m)   Wt 224 lb 8 oz (101.8 kg)   BMI 36.24 kg/m  Last Weight:  Wt Readings from Last 1 Encounters:  04/21/18 224 lb 8 oz (101.8 kg)   NID:POEU mass index is 36.24 kg/m.     Last Height:   Ht Readings from Last 1 Encounters:  04/21/18 5\' 6"  (1.676 m)    Physical exam:  General: The patient is awake, alert and appears not in acute distress. The patient is well groomed. Head: Normocephalic, atraumatic. Neck is supple.  Mallampati 5 - I can't see the uvula, pale mucosa. Full dentures - no biological teeth. ,  neck circumference: 15" . Nasal airflow congested ,  Retrognathia is seen.  Cardiovascular:  Regular rate and rhythm, without  murmurs or carotid bruit, and without distended neck veins. Respiratory: Lungs are clear to  auscultation. Skin:  Without evidence of edema, or rash Trunk: BMI is 36. 24. The patient's posture is relaxed.   Neurologic exam : The patient is awake and alert, oriented to place and time.   MOCA:No flowsheet data found. MMSE: MMSE - Mini Mental State Exam 03/01/2018  Orientation to time 5  Orientation to Place 4  Registration 3  Attention/ Calculation 3  Recall 3  Language- name 2 objects 2  Language- repeat 1  Language- follow 3 step command 3  Language- read & follow direction 1  Write a sentence 1  Copy design 1  Total score 27    Attention span & concentration ability appears limited .   Speech is fluent, without dysarthria, dysphonia or aphasia.  Mood and affect are appropriate.  Cranial nerves: Pupils are equal and briskly reactive to light.  Extraocular movements  in vertical and horizontal planes intact. Visual fields by finger perimetry are intact.Hearing to finger rub intact- but she has tinnitus.  Facial sensation intact to fine touch.  Facial motor strength is symmetric and tongue and uvula move midline. Shoulder shrug was symmetrical.   Motor exam:   Normal tone, muscle bulk and symmetric strength in all extremities.  Assessment:  After physical and neurologic examination, review of laboratory studies,  Personal review of imaging studies, reports of other /same  Imaging studies, results of polysomnography and / or neurophysiology testing and pre-existing records as far as provided in visit., my assessment is   1) patient reports her sleepiness , fatigue has improved since 02-2018 visit. She was severly depressed.   2) Family stated she snores, and she states her sleep is refreshing and restorative.   3) Poor sleep routines, sleep hygiene. She denies any insomnia.    The patient was advised of the nature of the diagnosed disorder , the treatment options and the  risks for general health and wellness arising from not treating the condition. We discussed sleep  habits, implementing some routines and setting time for sleep aside, sleeping weekends and weekdays the same.   I spent more than 40 minutes of face to face time with the patient.  Greater than 50% of time was spent in counseling and coordination of care. We have discussed the diagnosis and differential and I answered the patient's questions.    Plan:  Treatment plan and additional workup :  Patient is not interested in a sleep study. I gave her the 14 days sleep bootcamp. If she changes her mind, I will be happy to offer a HST.     Cheryl Seat, MD 32/08/4008, 27:25 AM  Certified in Neurology by ABPN Certified in [place] by [facility] [address], [place], [place] [address]

## 2018-05-28 ENCOUNTER — Ambulatory Visit (INDEPENDENT_AMBULATORY_CARE_PROVIDER_SITE_OTHER): Payer: Medicare Other | Admitting: Neurology

## 2018-05-28 DIAGNOSIS — G4710 Hypersomnia, unspecified: Secondary | ICD-10-CM | POA: Diagnosis not present

## 2018-05-28 DIAGNOSIS — E538 Deficiency of other specified B group vitamins: Secondary | ICD-10-CM

## 2018-05-28 DIAGNOSIS — F5104 Psychophysiologic insomnia: Secondary | ICD-10-CM

## 2018-05-28 DIAGNOSIS — G4712 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: Secondary | ICD-10-CM

## 2018-05-28 DIAGNOSIS — Z72821 Inadequate sleep hygiene: Secondary | ICD-10-CM

## 2018-05-28 DIAGNOSIS — G4709 Other insomnia: Secondary | ICD-10-CM

## 2018-05-28 DIAGNOSIS — R413 Other amnesia: Secondary | ICD-10-CM

## 2018-05-28 DIAGNOSIS — R0683 Snoring: Secondary | ICD-10-CM

## 2018-06-08 ENCOUNTER — Telehealth: Payer: Self-pay | Admitting: Neurology

## 2018-06-08 DIAGNOSIS — R0683 Snoring: Secondary | ICD-10-CM | POA: Insufficient documentation

## 2018-06-08 DIAGNOSIS — M1811 Unilateral primary osteoarthritis of first carpometacarpal joint, right hand: Secondary | ICD-10-CM | POA: Diagnosis not present

## 2018-06-08 DIAGNOSIS — Z72821 Inadequate sleep hygiene: Secondary | ICD-10-CM | POA: Insufficient documentation

## 2018-06-08 DIAGNOSIS — G4712 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: Secondary | ICD-10-CM | POA: Insufficient documentation

## 2018-06-08 DIAGNOSIS — F5104 Psychophysiologic insomnia: Secondary | ICD-10-CM | POA: Insufficient documentation

## 2018-06-08 NOTE — Procedures (Signed)
PATIENT'S NAME:  Cheryl Ortiz, Cheryl Ortiz DOB:      1951/04/09      MRN:    [id number]     DATE OF RECORDING: 05/28/2018 REFERRING M.D.:  Loletha Grayer. Floyde Parkins, MD Study Performed:   Baseline Polysomnogram HISTORY: Cheryl Ortiz is a 68 y.o. female patient of Dr. Jannifer Franklin', seen for a sleep evaluation.    Chief complaint according to patient: "Caregiver burden and grieving ".   Cheryl Ortiz is a 68 year old African-American right-handed female patient of Dr. Jannifer Franklin, who reports that the years 2018- 2019 had been very hard on her. Her children have told her she snores. Some afternoons she can be found napping- She has never been evaluated for sleep disorders.   Memory change, Aneurysm, Hyperlipidemia, Hypertension, Neuropathy, Renal disorder, Seasonal allergies, Type 2 diabetes. Poor sleep hygiene.   The patient endorsed the Epworth Sleepiness Scale at 10/24 points.   The patient's weight 224 pounds with a height of 66 (inches), resulting in a BMI of 36.1 kg/m2. Neck size 15 ".   CURRENT MEDICATIONS: Proventil, Aspirin, Hydrodiuril, Glucophage, Multivitamin.   PROCEDURE:  This is a multichannel digital polysomnogram utilizing the Somnostar 11.2 system.  Electrodes and sensors were applied and monitored per AASM Specifications.   EEG, EOG, Chin and Limb EMG, were sampled at 200 Hz.  ECG, Snore and Nasal Pressure, Thermal Airflow, Respiratory Effort, CPAP Flow and Pressure, Oximetry was sampled at 50 Hz. Digital video and audio were recorded.      BASELINE STUDY: Lights Out was at 21:25 and Lights On at 04:53.  Total recording time (TRT) was 449 minutes, with a total sleep time (TST) of 242 minutes.   The patient's sleep latency was 67 minutes.  REM latency was 152 minutes.  The sleep efficiency was 53.9 %.     SLEEP ARCHITECTURE: WASO (Wake after sleep onset) was 144.5 minutes.  There were 10 minutes in Stage N1, 155 minutes Stage N2, 60.5 minutes Stage N3 and 16.5 minutes in Stage REM.  The  percentage of Stage N1 was 4.1%, Stage N2 was 64.%, Stage N3 was 25% and Stage R (REM sleep) was 6.8%.   RESPIRATORY ANALYSIS:  There were a total of 3 respiratory events:  0 apneas and 3 hypopneas with few respiratory event related arousals (RERAs).      The total APNEA/HYPOPNEA INDEX (AHI) was 0.7/hour.  1 event occurred in REM sleep and 4 events in NREM. The REM AHI was 3.6 /hour, versus a non-REM AHI of 0.5/h. The patient spent 59 minutes of total sleep time in the supine position and 183 minutes in non-supine. The supine AHI was 2.0 versus a non-supine AHI of 0.3/h.  OXYGEN SATURATION & C02:  The Wake baseline 02 saturation was 90%, with the lowest being 88%. Time spent below 89% saturation equaled 0 minutes.   The arousals were noted as: 14 were spontaneous, 0 were associated with PLMs, and associated with respiratory events. The patient had a total of 0 Periodic Limb Movements  Audio and video analysis did not show any abnormal or unusual movements, behaviors, phonations or vocalizations.     IMPRESSION: non organic sleep disorder  1. Snoring without significant apnea. 2. Spontaneous arousals from sleep, Insomnia.  3. Early morning awakenings are frequently associated with depression.   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  No follow up in Sleep clinic is necessary.   I certify that I have reviewed  the entire raw data recording prior to the issuance of this report in accordance with the Standards of Accreditation of the American Academy of Sleep Medicine (AASM)     Larey Seat, MD   06-07-2018 Diplomat, American Board of Psychiatry and Neurology  Diplomat, American Board of [place] Director, [facility] [employer]

## 2018-06-08 NOTE — Telephone Encounter (Signed)
-----   Message from Larey Seat, MD sent at 06/08/2018  8:24 AM EST ----- IMPRESSION: non organic sleep disorder  1. Snoring without significant apnea. 2. Spontaneous arousals from sleep, Insomnia.  3. Early morning awakenings are frequently associated with  depression.   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  No follow up in Sleep clinic is necessary. Please arrange a follow up with the referring physician.

## 2018-06-08 NOTE — Telephone Encounter (Signed)
Called the patient and reviewed in detail the sleep study results. Advised the patient that everything indicated this was not organic sleep disorder. Advised the patient that Dr Brett Fairy recommends cognitive behavior therapy to help with the treatment of insomnia. Instructed the patient to follow up with PCP and have them make the referral if she chooses to treat her insomnia. Pt verbalized understanding. Pt had no questions at this time but was encouraged to call back if questions arise.

## 2018-06-11 DIAGNOSIS — E7849 Other hyperlipidemia: Secondary | ICD-10-CM | POA: Diagnosis not present

## 2018-06-11 DIAGNOSIS — Z6835 Body mass index (BMI) 35.0-35.9, adult: Secondary | ICD-10-CM | POA: Diagnosis not present

## 2018-06-11 DIAGNOSIS — E119 Type 2 diabetes mellitus without complications: Secondary | ICD-10-CM | POA: Diagnosis not present

## 2018-06-11 DIAGNOSIS — I10 Essential (primary) hypertension: Secondary | ICD-10-CM | POA: Diagnosis not present

## 2018-06-11 DIAGNOSIS — E6609 Other obesity due to excess calories: Secondary | ICD-10-CM | POA: Diagnosis not present

## 2018-06-17 DIAGNOSIS — Z6837 Body mass index (BMI) 37.0-37.9, adult: Secondary | ICD-10-CM | POA: Diagnosis not present

## 2018-06-17 DIAGNOSIS — M79644 Pain in right finger(s): Secondary | ICD-10-CM | POA: Diagnosis not present

## 2018-09-08 ENCOUNTER — Telehealth: Payer: Self-pay

## 2018-09-08 NOTE — Telephone Encounter (Signed)
I contacted the pt in regards to her 09/09/18 6 month f/u appt.  Due to current COVID 19 pandemic, our office is severely reducing in office visits for at least the next 2 weeks, in order to minimize the risk to our patients and healthcare providers.   Pt was first offered a virtual video visit for 09/08/18 at 9 am but declined. She did accept a telephone visit at stated time.   Pt understands that although there may be some limitations with this type of visit, we will take all precautions to reduce any security or privacy concerns.  Pt understands that this will be treated like an in office visit and we will file with pt's insurance, and there may be a patient responsible charge related to this service.  Pt's meds, allergies and PMH updated.  Best call back is [phone].

## 2018-09-09 ENCOUNTER — Ambulatory Visit (INDEPENDENT_AMBULATORY_CARE_PROVIDER_SITE_OTHER): Payer: Medicare Other | Admitting: Neurology

## 2018-09-09 ENCOUNTER — Other Ambulatory Visit: Payer: Self-pay

## 2018-09-09 ENCOUNTER — Encounter: Payer: Self-pay | Admitting: Neurology

## 2018-09-09 DIAGNOSIS — R413 Other amnesia: Secondary | ICD-10-CM

## 2018-09-09 NOTE — Progress Notes (Signed)
     Virtual Visit via Telephone Note  I connected with Cheryl Ortiz on 09/09/18 at  9:00 AM EDT by telephone and verified that I am speaking with the correct person using two identifiers.   I discussed the limitations, risks, security and privacy concerns of performing an evaluation and management service by telephone and the availability of in person appointments. I also discussed with the patient that there may be a patient responsible charge related to this service. The patient expressed understanding and agreed to proceed.  The patient is at home, physician office.   History of Present Illness: Cheryl Ortiz is a 68 year old right-handed black female with a history of a reported memory disturbance.  The patient reported increased fatigue and daytime drowsiness, she was sent for sleep evaluation but it was determined that she had underlying stress and depression taking care of her brother and her mother.  The patient is also involved with a multitude of other committees and church responsibilities, she also works 1 day a week.  She has actually cut back on some of her activities because of the restrictions associated with COVID virus.  The patient mainly notes that she has having problems remembering things that she has read, she has to reread things frequently.  She is still functioning normally with all of her other activities of daily living, she drives a car without difficulty.  She manages her own medications and appointments and keep up with the schedule of her brother.  The patient has an irregular sleep schedule, she may go to bed anywhere between 9 AM and midnight and get up early in the morning to do laundry.  She does not believe there has been a significant change in her memory since last seen.   Observations/Objective: Telephone evaluation reveals that the patient is alert and cooperative.  The speech is well enunciated, not aphasic or dysarthric.  She is responding to  questions appropriately.  A [facility]-blind evaluation was done, the patient scored 21/22 on the study.  Assessment and Plan: 1.  Reported memory disturbance  The patient is doing fairly well with her cognitive abilities over time.  She scored quite well on the [facility]-blind evaluation.  She will follow-up in 6 months, will not add medication for memory at this time.  Follow Up Instructions: 61-month follow-up, may see nurse practitioner.   I discussed the assessment and treatment plan with the patient. The patient was provided an opportunity to ask questions and all were answered. The patient agreed with the plan and demonstrated an understanding of the instructions.   The patient was advised to call back or seek an in-person evaluation if the symptoms worsen or if the condition fails to improve as anticipated.  I provided 30 minutes of non-face-to-face time during this encounter.   Kathrynn Ducking, MD

## 2018-10-12 DIAGNOSIS — E782 Mixed hyperlipidemia: Secondary | ICD-10-CM | POA: Diagnosis not present

## 2018-10-12 DIAGNOSIS — Z23 Encounter for immunization: Secondary | ICD-10-CM | POA: Diagnosis not present

## 2018-10-12 DIAGNOSIS — E1140 Type 2 diabetes mellitus with diabetic neuropathy, unspecified: Secondary | ICD-10-CM | POA: Diagnosis not present

## 2018-10-12 DIAGNOSIS — J302 Other seasonal allergic rhinitis: Secondary | ICD-10-CM | POA: Diagnosis not present

## 2018-10-12 DIAGNOSIS — I10 Essential (primary) hypertension: Secondary | ICD-10-CM | POA: Diagnosis not present

## 2018-10-12 DIAGNOSIS — J4520 Mild intermittent asthma, uncomplicated: Secondary | ICD-10-CM | POA: Diagnosis not present

## 2018-10-12 DIAGNOSIS — E6609 Other obesity due to excess calories: Secondary | ICD-10-CM | POA: Diagnosis not present

## 2018-10-12 DIAGNOSIS — Z6836 Body mass index (BMI) 36.0-36.9, adult: Secondary | ICD-10-CM | POA: Diagnosis not present

## 2018-10-12 DIAGNOSIS — E119 Type 2 diabetes mellitus without complications: Secondary | ICD-10-CM | POA: Diagnosis not present

## 2018-10-12 DIAGNOSIS — E7849 Other hyperlipidemia: Secondary | ICD-10-CM | POA: Diagnosis not present

## 2018-10-15 ENCOUNTER — Other Ambulatory Visit (HOSPITAL_COMMUNITY): Payer: Self-pay | Admitting: Family Medicine

## 2018-10-15 DIAGNOSIS — E2839 Other primary ovarian failure: Secondary | ICD-10-CM

## 2018-10-18 ENCOUNTER — Other Ambulatory Visit (HOSPITAL_COMMUNITY): Payer: Self-pay | Admitting: Family Medicine

## 2018-10-18 DIAGNOSIS — Z1231 Encounter for screening mammogram for malignant neoplasm of breast: Secondary | ICD-10-CM

## 2018-10-25 ENCOUNTER — Other Ambulatory Visit: Payer: Self-pay

## 2018-10-25 ENCOUNTER — Ambulatory Visit (HOSPITAL_COMMUNITY)
Admission: RE | Admit: 2018-10-25 | Discharge: 2018-10-25 | Disposition: A | Discharge status: Home / Self Care | Payer: Medicare Other | Source: Ambulatory Visit | Attending: Family Medicine | Admitting: Family Medicine

## 2018-10-25 DIAGNOSIS — Z780 Asymptomatic menopausal state: Secondary | ICD-10-CM | POA: Diagnosis not present

## 2018-10-25 DIAGNOSIS — E2839 Other primary ovarian failure: Secondary | ICD-10-CM | POA: Insufficient documentation

## 2018-10-25 DIAGNOSIS — M8588 Other specified disorders of bone density and structure, other site: Secondary | ICD-10-CM | POA: Diagnosis not present

## 2018-11-24 ENCOUNTER — Other Ambulatory Visit: Payer: Self-pay | Admitting: Neurosurgery

## 2018-11-24 DIAGNOSIS — I671 Cerebral aneurysm, nonruptured: Secondary | ICD-10-CM

## 2018-12-02 ENCOUNTER — Other Ambulatory Visit: Payer: Self-pay | Admitting: Neurosurgery

## 2018-12-02 DIAGNOSIS — I671 Cerebral aneurysm, nonruptured: Secondary | ICD-10-CM

## 2018-12-09 ENCOUNTER — Other Ambulatory Visit: Payer: Self-pay

## 2018-12-09 ENCOUNTER — Ambulatory Visit (INDEPENDENT_AMBULATORY_CARE_PROVIDER_SITE_OTHER): Payer: Medicare Other | Admitting: Adult Health

## 2018-12-09 ENCOUNTER — Encounter: Payer: Self-pay | Admitting: Adult Health

## 2018-12-09 VITALS — BP 121/81 | HR 93 | Ht 64.5 in | Wt 227.0 lb

## 2018-12-09 DIAGNOSIS — N8110 Cystocele, unspecified: Secondary | ICD-10-CM

## 2018-12-09 DIAGNOSIS — Z1211 Encounter for screening for malignant neoplasm of colon: Secondary | ICD-10-CM | POA: Insufficient documentation

## 2018-12-09 DIAGNOSIS — Z1212 Encounter for screening for malignant neoplasm of rectum: Secondary | ICD-10-CM | POA: Diagnosis not present

## 2018-12-09 DIAGNOSIS — Z01419 Encounter for gynecological examination (general) (routine) without abnormal findings: Secondary | ICD-10-CM

## 2018-12-09 LAB — HEMOCCULT GUIAC POC 1CARD (OFFICE): Fecal Occult Blood, POC: NEGATIVE

## 2018-12-09 NOTE — Progress Notes (Signed)
Patient ID: Cheryl Ortiz, female   DOB: [birth year], 68 y.o.   MRN: [id number] History of Present Illness: Cheryl Ortiz is a 68 year old black female, widowed, sp hysterectomy in for a well woman gyn exam. PCP is Dr Hilma Favors.    Current Medications, Allergies, Past Medical History, Past Surgical History, Family History and Social History were reviewed in [employer] record.     Review of Systems:  Patient denies any headaches, hearing loss, fatigue, blurred vision, shortness of breath, chest pain, abdominal pain, problems with bowel movements, urination, or intercourse(not active). No joint pain or mood swings. Feels like something falling out,no pain  Physical Exam:BP 121/81 (BP Location: Left Arm, Patient Position: Sitting, Cuff Size: Normal)   Pulse 93   Ht 5' 4.5" (1.638 m)   Wt 227 lb (103 kg)   BMI 38.36 kg/m  General:  Well developed, well nourished, no acute distress Skin:  Warm and dry Neck:  Midline trachea, normal thyroid, good ROM, no lymphadenopathy,no carotid bruits heard Lungs; Clear to auscultation bilaterally Breast:  No dominant palpable mass, retraction, or nipple discharge Cardiovascular: Regular rate and rhythm Abdomen:  Soft, non tender, no hepatosplenomegaly Pelvic:  External genitalia is normal in appearance, no lesions.  The vagina is pale pink with loss of moisture and rugae, has cystocele, that reaches introitus if bearing down, showed her with mirror. Urethra has no lesions or masses. The cervix and uterus are absent.  No adnexal masses or tenderness noted.Bladder is non tender, no masses felt. Rectal: Good sphincter tone, no polyps, or hemorrhoids felt.  Hemoccult negative. Extremities/musculoskeletal:  No swelling or varicosities noted, no clubbing or cyanosis Psych:  No mood changes, alert and cooperative,seems happy Fall risk is low. PHQ 2 score is 0. Examination chaperoned by Cheryl Pupa LPN. Showed medical explainer  #4.   Impression: 1. Encounter for well woman exam with routine gynecological exam   2. Screening for colorectal cancer   3. Female cystocele       Plan: Discussed pessary as option if it bothers her, but try not to strain and may stay just as is Physical in  2 year Labs with PCP Mammogram yearly Has up coming CT

## 2018-12-14 ENCOUNTER — Ambulatory Visit
Admission: RE | Admit: 2018-12-14 | Discharge: 2018-12-14 | Disposition: A | Discharge status: Home / Self Care | Payer: Medicare Other | Source: Ambulatory Visit | Attending: Neurosurgery | Admitting: Neurosurgery

## 2018-12-14 DIAGNOSIS — I671 Cerebral aneurysm, nonruptured: Secondary | ICD-10-CM | POA: Diagnosis not present

## 2018-12-14 MED ORDER — IOPAMIDOL (ISOVUE-370) INJECTION 76%
75.0000 mL | Freq: Once | INTRAVENOUS | Status: AC | PRN
  Administered 2018-12-14: 75 mL via INTRAVENOUS

## 2018-12-16 ENCOUNTER — Ambulatory Visit (HOSPITAL_COMMUNITY)
Admission: RE | Admit: 2018-12-16 | Discharge: 2018-12-16 | Disposition: A | Discharge status: Home / Self Care | Payer: Medicare Other | Source: Ambulatory Visit | Attending: Family Medicine | Admitting: Family Medicine

## 2018-12-16 ENCOUNTER — Other Ambulatory Visit: Payer: Self-pay

## 2018-12-16 DIAGNOSIS — Z1231 Encounter for screening mammogram for malignant neoplasm of breast: Secondary | ICD-10-CM | POA: Insufficient documentation

## 2018-12-25 DIAGNOSIS — E119 Type 2 diabetes mellitus without complications: Secondary | ICD-10-CM | POA: Diagnosis not present

## 2018-12-30 DIAGNOSIS — I671 Cerebral aneurysm, nonruptured: Secondary | ICD-10-CM | POA: Diagnosis not present

## 2019-01-10 ENCOUNTER — Other Ambulatory Visit: Payer: Self-pay

## 2019-01-10 DIAGNOSIS — Z20822 Contact with and (suspected) exposure to covid-19: Secondary | ICD-10-CM

## 2019-01-10 DIAGNOSIS — R6889 Other general symptoms and signs: Secondary | ICD-10-CM | POA: Diagnosis not present

## 2019-01-11 LAB — NOVEL CORONAVIRUS, NAA: SARS-CoV-2, NAA: NOT DETECTED

## 2019-01-12 DIAGNOSIS — M722 Plantar fascial fibromatosis: Secondary | ICD-10-CM | POA: Diagnosis not present

## 2019-01-14 DIAGNOSIS — N200 Calculus of kidney: Secondary | ICD-10-CM | POA: Diagnosis not present

## 2019-02-09 DIAGNOSIS — Z23 Encounter for immunization: Secondary | ICD-10-CM | POA: Diagnosis not present

## 2019-02-11 ENCOUNTER — Other Ambulatory Visit: Payer: Self-pay

## 2019-02-11 DIAGNOSIS — Z20822 Contact with and (suspected) exposure to covid-19: Secondary | ICD-10-CM

## 2019-02-11 DIAGNOSIS — R6889 Other general symptoms and signs: Secondary | ICD-10-CM | POA: Diagnosis not present

## 2019-02-12 LAB — NOVEL CORONAVIRUS, NAA: SARS-CoV-2, NAA: NOT DETECTED

## 2019-03-14 ENCOUNTER — Ambulatory Visit: Payer: Medicare Other | Admitting: Neurology

## 2019-03-18 DIAGNOSIS — J4521 Mild intermittent asthma with (acute) exacerbation: Secondary | ICD-10-CM | POA: Diagnosis not present

## 2019-03-18 DIAGNOSIS — J4520 Mild intermittent asthma, uncomplicated: Secondary | ICD-10-CM | POA: Diagnosis not present

## 2019-03-18 DIAGNOSIS — Z6836 Body mass index (BMI) 36.0-36.9, adult: Secondary | ICD-10-CM | POA: Diagnosis not present

## 2019-03-18 DIAGNOSIS — E7849 Other hyperlipidemia: Secondary | ICD-10-CM | POA: Diagnosis not present

## 2019-03-18 DIAGNOSIS — E119 Type 2 diabetes mellitus without complications: Secondary | ICD-10-CM | POA: Diagnosis not present

## 2019-03-18 DIAGNOSIS — E1140 Type 2 diabetes mellitus with diabetic neuropathy, unspecified: Secondary | ICD-10-CM | POA: Diagnosis not present

## 2019-03-18 DIAGNOSIS — J3089 Other allergic rhinitis: Secondary | ICD-10-CM | POA: Diagnosis not present

## 2019-03-18 DIAGNOSIS — R5383 Other fatigue: Secondary | ICD-10-CM | POA: Diagnosis not present

## 2019-04-18 DIAGNOSIS — Z0000 Encounter for general adult medical examination without abnormal findings: Secondary | ICD-10-CM | POA: Diagnosis not present

## 2019-04-18 DIAGNOSIS — E119 Type 2 diabetes mellitus without complications: Secondary | ICD-10-CM | POA: Diagnosis not present

## 2019-04-18 DIAGNOSIS — Z6837 Body mass index (BMI) 37.0-37.9, adult: Secondary | ICD-10-CM | POA: Diagnosis not present

## 2019-04-20 ENCOUNTER — Other Ambulatory Visit: Payer: Self-pay

## 2019-04-20 DIAGNOSIS — Z20822 Contact with and (suspected) exposure to covid-19: Secondary | ICD-10-CM

## 2019-04-20 DIAGNOSIS — Z20828 Contact with and (suspected) exposure to other viral communicable diseases: Secondary | ICD-10-CM | POA: Diagnosis not present

## 2019-04-23 LAB — NOVEL CORONAVIRUS, NAA: SARS-CoV-2, NAA: NOT DETECTED

## 2019-06-19 DIAGNOSIS — M1990 Unspecified osteoarthritis, unspecified site: Secondary | ICD-10-CM | POA: Diagnosis not present

## 2019-06-19 DIAGNOSIS — E7849 Other hyperlipidemia: Secondary | ICD-10-CM | POA: Diagnosis not present

## 2019-06-19 DIAGNOSIS — E1140 Type 2 diabetes mellitus with diabetic neuropathy, unspecified: Secondary | ICD-10-CM | POA: Diagnosis not present

## 2019-06-19 DIAGNOSIS — I10 Essential (primary) hypertension: Secondary | ICD-10-CM | POA: Diagnosis not present

## 2019-07-29 ENCOUNTER — Ambulatory Visit: Payer: Medicare Other

## 2019-07-30 ENCOUNTER — Ambulatory Visit: Payer: Medicare Other | Attending: Internal Medicine

## 2019-07-30 ENCOUNTER — Other Ambulatory Visit: Payer: Self-pay

## 2019-07-30 DIAGNOSIS — Z23 Encounter for immunization: Secondary | ICD-10-CM

## 2019-07-30 NOTE — Progress Notes (Signed)
   Covid-19 Vaccination Clinic  Name:  Cheryl Ortiz    MRN: [id number] DOB: 06-20-1950  07/30/2019  [name] was observed post Covid-19 immunization for 15 minutes without incident. She was provided with Vaccine Information Sheet and instruction to access the V-Safe system.   [name] was instructed to call 911 with any severe reactions post vaccine: Marland Kitchen Difficulty breathing  . Swelling of face and throat  . A fast heartbeat  . A bad rash all over body  . Dizziness and weakness   Immunizations Administered    Name Date Dose VIS Date Route   Moderna COVID-19 Vaccine 07/30/2019  1:02 PM 0.5 mL 04/19/2019 Intramuscular   Manufacturer: Moderna   Lot: [id number]   [id number]

## 2019-08-01 DIAGNOSIS — R5383 Other fatigue: Secondary | ICD-10-CM | POA: Diagnosis not present

## 2019-08-01 DIAGNOSIS — E7849 Other hyperlipidemia: Secondary | ICD-10-CM | POA: Diagnosis not present

## 2019-08-01 DIAGNOSIS — E119 Type 2 diabetes mellitus without complications: Secondary | ICD-10-CM | POA: Diagnosis not present

## 2019-08-01 DIAGNOSIS — Z6838 Body mass index (BMI) 38.0-38.9, adult: Secondary | ICD-10-CM | POA: Diagnosis not present

## 2019-08-01 DIAGNOSIS — Z1389 Encounter for screening for other disorder: Secondary | ICD-10-CM | POA: Diagnosis not present

## 2019-08-01 DIAGNOSIS — I10 Essential (primary) hypertension: Secondary | ICD-10-CM | POA: Diagnosis not present

## 2019-08-17 DIAGNOSIS — E7849 Other hyperlipidemia: Secondary | ICD-10-CM | POA: Diagnosis not present

## 2019-08-17 DIAGNOSIS — I10 Essential (primary) hypertension: Secondary | ICD-10-CM | POA: Diagnosis not present

## 2019-08-17 DIAGNOSIS — M1990 Unspecified osteoarthritis, unspecified site: Secondary | ICD-10-CM | POA: Diagnosis not present

## 2019-08-17 DIAGNOSIS — E1140 Type 2 diabetes mellitus with diabetic neuropathy, unspecified: Secondary | ICD-10-CM | POA: Diagnosis not present

## 2019-08-31 ENCOUNTER — Ambulatory Visit: Payer: Medicare Other | Attending: Internal Medicine

## 2019-08-31 DIAGNOSIS — Z23 Encounter for immunization: Secondary | ICD-10-CM

## 2019-08-31 NOTE — Progress Notes (Signed)
   Covid-19 Vaccination Clinic  Name:  CAROLLE DUELL    MRN: [id number] DOB: 1951/04/14  08/31/2019  Ms. Meins was observed post Covid-19 immunization for 15 minutes without incident. She was provided with Vaccine Information Sheet and instruction to access the V-Safe system.   Ms. Wagenblast was instructed to call 911 with any severe reactions post vaccine: Marland Kitchen Difficulty breathing  . Swelling of face and throat  . A fast heartbeat  . A bad rash all over body  . Dizziness and weakness   Immunizations Administered    Name Date Dose VIS Date Route   Moderna COVID-19 Vaccine 08/31/2019 12:17 PM 0.5 mL 04/19/2019 Intramuscular   Manufacturer: Moderna   Lot: [id number]   [id number]

## 2019-09-27 ENCOUNTER — Emergency Department (HOSPITAL_COMMUNITY)
Admission: EM | Admit: 2019-09-27 | Discharge: 2019-09-27 | Disposition: A | Discharge status: Home / Self Care | Payer: Medicare Other | Attending: Emergency Medicine | Admitting: Emergency Medicine

## 2019-09-27 ENCOUNTER — Other Ambulatory Visit: Payer: Self-pay

## 2019-09-27 ENCOUNTER — Encounter (HOSPITAL_COMMUNITY): Payer: Self-pay

## 2019-09-27 ENCOUNTER — Emergency Department (HOSPITAL_COMMUNITY): Payer: Medicare Other

## 2019-09-27 DIAGNOSIS — Z7982 Long term (current) use of aspirin: Secondary | ICD-10-CM | POA: Diagnosis not present

## 2019-09-27 DIAGNOSIS — Z87891 Personal history of nicotine dependence: Secondary | ICD-10-CM | POA: Insufficient documentation

## 2019-09-27 DIAGNOSIS — I10 Essential (primary) hypertension: Secondary | ICD-10-CM | POA: Diagnosis not present

## 2019-09-27 DIAGNOSIS — R42 Dizziness and giddiness: Secondary | ICD-10-CM | POA: Diagnosis present

## 2019-09-27 DIAGNOSIS — H8110 Benign paroxysmal vertigo, unspecified ear: Secondary | ICD-10-CM | POA: Insufficient documentation

## 2019-09-27 DIAGNOSIS — Z79899 Other long term (current) drug therapy: Secondary | ICD-10-CM | POA: Diagnosis not present

## 2019-09-27 DIAGNOSIS — Z7984 Long term (current) use of oral hypoglycemic drugs: Secondary | ICD-10-CM | POA: Diagnosis not present

## 2019-09-27 DIAGNOSIS — E119 Type 2 diabetes mellitus without complications: Secondary | ICD-10-CM | POA: Insufficient documentation

## 2019-09-27 DIAGNOSIS — R29818 Other symptoms and signs involving the nervous system: Secondary | ICD-10-CM | POA: Diagnosis not present

## 2019-09-27 LAB — APTT: aPTT: 30 seconds (ref 24–36)

## 2019-09-27 LAB — CBC WITH DIFFERENTIAL/PLATELET
Abs Immature Granulocytes: 0 10*3/uL (ref 0.00–0.07)
Basophils Absolute: 0.1 10*3/uL (ref 0.0–0.1)
Basophils Relative: 2 %
Eosinophils Absolute: 0.3 10*3/uL (ref 0.0–0.5)
Eosinophils Relative: 6 %
HCT: 40.8 % (ref 36.0–46.0)
Hemoglobin: 12.8 g/dL (ref 12.0–15.0)
Immature Granulocytes: 0 %
Lymphocytes Relative: 41 %
Lymphs Abs: 1.9 10*3/uL (ref 0.7–4.0)
MCH: 28.3 pg (ref 26.0–34.0)
MCHC: 31.4 g/dL (ref 30.0–36.0)
MCV: 90.3 fL (ref 80.0–100.0)
Monocytes Absolute: 0.4 10*3/uL (ref 0.1–1.0)
Monocytes Relative: 8 %
Neutro Abs: 1.9 10*3/uL (ref 1.7–7.7)
Neutrophils Relative %: 43 %
Platelets: 288 10*3/uL (ref 150–400)
RBC: 4.52 MIL/uL (ref 3.87–5.11)
RDW: 12.9 % (ref 11.5–15.5)
WBC: 4.6 10*3/uL (ref 4.0–10.5)
nRBC: 0 % (ref 0.0–0.2)

## 2019-09-27 LAB — BASIC METABOLIC PANEL
Anion gap: 12 (ref 5–15)
BUN: 18 mg/dL (ref 8–23)
CO2: 26 mmol/L (ref 22–32)
Calcium: 9.6 mg/dL (ref 8.9–10.3)
Chloride: 102 mmol/L (ref 98–111)
Creatinine, Ser: 0.85 mg/dL (ref 0.44–1.00)
GFR calc Af Amer: >60 mL/min (ref >60)
GFR calc non Af Amer: >60 mL/min (ref >60)
Glucose, Bld: 82 mg/dL (ref 70–99)
Potassium: 3.2 mmol/L — ABNORMAL LOW (ref 3.5–5.1)
Sodium: 140 mmol/L (ref 135–145)

## 2019-09-27 LAB — URINALYSIS, ROUTINE W REFLEX MICROSCOPIC
Bilirubin Urine: NEGATIVE
Glucose, UA: NEGATIVE mg/dL
Hgb urine dipstick: NEGATIVE
Ketones, ur: NEGATIVE mg/dL
Leukocytes,Ua: NEGATIVE
Nitrite: NEGATIVE
Protein, ur: NEGATIVE mg/dL
Specific Gravity, Urine: 1.015 (ref 1.005–1.030)
pH: 6 (ref 5.0–8.0)

## 2019-09-27 LAB — PROTIME-INR
INR: 1 (ref 0.8–1.2)
Prothrombin Time: 12.3 seconds (ref 11.4–15.2)

## 2019-09-27 LAB — RAPID URINE DRUG SCREEN, HOSP PERFORMED
Amphetamines: NOT DETECTED
Barbiturates: NOT DETECTED
Benzodiazepines: NOT DETECTED
Cocaine: NOT DETECTED
Opiates: NOT DETECTED
Tetrahydrocannabinol: NOT DETECTED

## 2019-09-27 LAB — CBG MONITORING, ED: Glucose-Capillary: 75 mg/dL (ref 70–99)

## 2019-09-27 LAB — ETHANOL: Alcohol, Ethyl (B): <10 mg/dL (ref <10)

## 2019-09-27 MED ORDER — PROMETHAZINE HCL 25 MG/ML IJ SOLN
12.5000 mg | Freq: Once | INTRAMUSCULAR | Status: DC
  Filled 2019-09-27: qty 1

## 2019-09-27 MED ORDER — MECLIZINE HCL 12.5 MG PO TABS
25.0000 mg | ORAL_TABLET | Freq: Once | ORAL | Status: AC
  Administered 2019-09-27: 25 mg via ORAL
  Filled 2019-09-27: qty 2

## 2019-09-27 MED ORDER — MECLIZINE HCL 25 MG PO TABS
25.0000 mg | ORAL_TABLET | Freq: Three times a day (TID) | ORAL | 0 refills | Status: DC | PRN
Start: 2019-09-27 — End: 2020-03-07

## 2019-09-27 NOTE — ED Provider Notes (Signed)
[facility] EMERGENCY DEPARTMENT Provider Note   CSN: [id number] Arrival date & time: 09/27/19  1030     History Chief Complaint  Patient presents with  . Dizziness    Cheryl Ortiz is a 69 y.o. female.  Pt presents to the ED today with dizziness which started when she woke up at [place].  Pt said everything is moving and she has to hold onto things when she walks.  She was able to drive here, but lives only a block away.  She has chronic ringing in her ears from her job.  No f/c.          Past Medical History:  Diagnosis Date  . Aneurysm ([place]) 06/2015   brain-small will recheck in 1 year  . Cold   . Cystocele 07/12/2015  . Hyperlipidemia   . Hypertension   . Neuropathy of both feet    tingling/numbness bilateral feet ,rt arm,hands-nerve study scheduled  . Osteoarthritis   . Renal disorder    kidney stones  . Seasonal allergies   . Type 2 diabetes mellitus [facility])     Patient Active Problem List   Diagnosis Date Noted  . Screening for colorectal cancer 12/09/2018  . 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 Snoring 06/08/2018  . Poor sleep hygiene 06/08/2018  . Chronic insomnia 06/08/2018  . Encounter for well woman exam with routine gynecological exam 09/09/2017  . Female cystocele 09/09/2017  . Cystocele 07/12/2015  . Precordial pain 02/18/2013  . Type 2 diabetes mellitus ([place]) 02/18/2013  . Hyperlipidemia 02/17/2013  . Essential hypertension, benign 02/17/2013    Past Surgical History:  Procedure Laterality Date  . CHOLECYSTECTOMY  1999  . COLONOSCOPY N/A 06/15/2014   Procedure: COLONOSCOPY;  Surgeon: Rogene Houston, MD;  [facility];  Service: Endoscopy;  Laterality: N/A;  1145  . CYSTOSCOPY WITH RETROGRADE PYELOGRAM, URETEROSCOPY AND STENT PLACEMENT Bilateral 08/20/2015   Procedure: CYSTOSCOPY WITH RETROGRADE PYELOGRAM, URETEROSCOPY AND STENT PLACEMENT WITH STONE EXTRACTION WITH BASKET;  Surgeon: Cleon Gustin, MD;   Location: [facility];  Service: Urology;  Laterality: Bilateral;  . ESOPHAGEAL DILATION N/A 03/23/2015   Procedure: ESOPHAGEAL DILATION;  Surgeon: Rogene Houston, MD;  [facility];  Service: Endoscopy;  Laterality: N/A;  . ESOPHAGOGASTRODUODENOSCOPY N/A 03/23/2015   Procedure: ESOPHAGOGASTRODUODENOSCOPY (EGD);  Surgeon: Rogene Houston, MD;  [facility];  Service: Endoscopy;  Laterality: N/A;  8:55 - moved to 12:35 - Ann to notify pt  . FOOT SURGERY Bilateral   . HOLMIUM LASER APPLICATION Bilateral [id number]   Procedure: HOLMIUM LASER APPLICATION;  Surgeon: Cleon Gustin, MD;  Location: [facility];  Service: Urology;  Laterality: Bilateral;  . VAGINAL HYSTERECTOMY  1984     OB History    Gravida  2   Para  1   Term      Preterm      AB  1   Living  1     SAB  1   TAB      Ectopic      Multiple      Live Births  1           Family History  Problem Relation Age of Onset  . Hypertension Mother   . Arthritis Mother   . Cancer Mother        breast   . Cerebral palsy Brother   . Early death Brother   . Hypertension  Brother   . Heart disease Brother   . Pneumonia Father   . Hypertension Father   . Hyperlipidemia Sister   . Fibroids Sister   . Hypertension Brother   . Heart attack Brother     Social History   Tobacco Use  . Smoking status: Former Smoker    Packs/day: 0.50    Years: 12.00    Pack years: 6.00    Types: Cigarettes    Quit date: 08/15/1997    Years since quitting: 22.1  . Smokeless tobacco: Never Used  Substance Use Topics  . Alcohol use: Not Currently    Comment: occ. wine  . Drug use: No    Home Medications Prior to Admission medications   Medication Sig Start Date End Date Taking? Authorizing Provider  Albuterol (PROVENTIL IN) Inhale 2 Inhalers into the lungs 2 (two) times daily.    [provider]  aspirin 81 MG tablet Take 81 mg by mouth daily.    [provider]  hydrochlorothiazide (HYDRODIURIL) 25 MG tablet Take 25 mg by mouth daily. 10/30/17   [provider]  meclizine (ANTIVERT) 25 MG tablet Take 1 tablet (25 mg total) by mouth 3 (three) times daily as needed for dizziness. 09/27/19   Isla Pence, MD  metFORMIN (GLUCOPHAGE) 500 MG tablet Take 1,000 mg by mouth daily with breakfast.     [provider]  UNABLE TO FIND Herbal supplement-daily; nerve pill-unsure of name-prn    [provider]  VITAMIN D PO Take 50,000 mg by mouth once a week.    [provider]    Allergies    Codeine and Prednisone  Review of Systems   Review of Systems  Neurological: Positive for dizziness.  All other systems reviewed and are negative.   Physical Exam Updated Vital Signs BP 132/76 (BP Location: Right Arm)   Pulse 73   Temp 98.2 F (36.8 C) (Oral)   Resp 20   Ht 5\' 6"  (1.676 m)   Wt 103.4 kg   SpO2 98%   BMI 36.80 kg/m   Physical Exam Vitals and nursing note reviewed.  Constitutional:      Appearance: Normal appearance.  HENT:     Head: Normocephalic and atraumatic.     Right Ear: External ear normal.     Left Ear: External ear normal.     Nose: Nose normal.     Mouth/Throat:     Mouth: Mucous membranes are moist.     Pharynx: Oropharynx is clear.  Eyes:     Extraocular Movements: Extraocular movements intact.     Conjunctiva/sclera: Conjunctivae normal.     Pupils: Pupils are equal, round, and reactive to light.  Cardiovascular:     Rate and Rhythm: Normal rate and regular rhythm.     Pulses: Normal pulses.     Heart sounds: Normal heart sounds.  Pulmonary:     Effort: Pulmonary effort is normal.     Breath sounds: Normal breath sounds.  Abdominal:     General: Abdomen is flat. Bowel sounds are normal.     Palpations: Abdomen is soft.  Musculoskeletal:        General: Normal range of motion.     Cervical back: Normal range of motion and neck supple.  Skin:    General: Skin is  warm.     Capillary Refill: Capillary refill takes less than 2 seconds.  Neurological:     General: No focal deficit present.  Mental Status: She is alert and oriented to person, place, and time.  Psychiatric:        Mood and Affect: Mood normal.        Behavior: Behavior normal.        Thought Content: Thought content normal.        Judgment: Judgment normal.     ED Results / Procedures / Treatments   Labs (all labs ordered are listed, but only abnormal results are displayed) Labs Reviewed  BASIC METABOLIC PANEL - Abnormal; Notable for the following components:      Result Value   Potassium 3.2 (*)    All other components within normal limits  URINALYSIS, ROUTINE W REFLEX MICROSCOPIC  CBC WITH DIFFERENTIAL/PLATELET  ETHANOL  PROTIME-INR  APTT  RAPID URINE DRUG SCREEN, HOSP PERFORMED  CBG MONITORING, ED    EKG EKG Interpretation  Date/Time:  Tuesday Sep 27 2019 11:03:10 EDT Ventricular Rate:  66 PR Interval:    QRS Duration: 97 QT Interval:  421 QTC Calculation: 442 R Axis:   21 Text Interpretation: Sinus rhythm Abnormal R-wave progression, early transition Baseline wander in lead(s) V2 No significant change since last tracing Confirmed by Isla Pence [phone]) on 09/27/2019 12:35:12 PM   Radiology CT HEAD WO CONTRAST  Result Date: 09/27/2019 CLINICAL DATA:  Neuro deficit, acute stroke suspected. EXAM: CT HEAD WITHOUT CONTRAST TECHNIQUE: Contiguous axial images were obtained from the base of the skull through the vertex without intravenous contrast. COMPARISON:  None. FINDINGS: Brain: No acute intracranial hemorrhage. No focal mass lesion. No CT evidence of acute infarction. No midline shift or mass effect. No hydrocephalus. Basilar cisterns are patent. Mild periventricular white matter hypodensities. Hypodensity in the lateral RIGHT frontal lobe (image 19/2) is not changed from prior and favored a prominent sulcus. Vascular: No hyperdense vessel or unexpected  calcification. Skull: Normal. Negative for fracture or focal lesion. Sinuses/Orbits: Paranasal sinuses and mastoid air cells are clear. Orbits are clear. Other: None. IMPRESSION: 1. No CT evidence of cortical infarction. 2. Mild subcortical white matter hypodensities most consistent with small vessel ischemia. Electronically Signed   By: Suzy Bouchard M.D.   On: 09/27/2019 12:46    Procedures Procedures (including critical care time)  Medications Ordered in ED Medications  meclizine (ANTIVERT) tablet 25 mg (25 mg Oral Given 09/27/19 1109)    ED Course  I have reviewed the triage vital signs and the nursing notes.  Pertinent labs & imaging results that were available during my care of the patient were reviewed by me and considered in my medical decision making (see chart for details).    MDM Rules/Calculators/A&P                      Pt feels much better.  She said the dizziness is gone.  She is stable for d/c.  Return if worse. Final Clinical Impression(s) / ED Diagnoses Final diagnoses:  Benign paroxysmal positional vertigo, unspecified laterality    Rx / DC Orders ED Discharge Orders         Ordered    meclizine (ANTIVERT) 25 MG tablet  3 times daily PRN     09/27/19 1251           Isla Pence, MD 09/27/19 1252

## 2019-09-27 NOTE — ED Notes (Signed)
Pt was informed that we need a urine sample. 

## 2019-09-27 NOTE — ED Notes (Signed)
Pt to CT

## 2019-09-27 NOTE — ED Triage Notes (Signed)
Pt reports went to bed at 10pm last night and woke up at 0515 with dizziness.  Pt says the slightest movement makes her dizzy.  Denies nausea.  CBG 119 at home.  Denies headache or weakness and says broke out in a sweat earlier today but since has gone away.  Pt says has to hold on to things to walk.

## 2019-09-29 DIAGNOSIS — J4520 Mild intermittent asthma, uncomplicated: Secondary | ICD-10-CM | POA: Diagnosis not present

## 2019-09-29 DIAGNOSIS — H8110 Benign paroxysmal vertigo, unspecified ear: Secondary | ICD-10-CM | POA: Diagnosis not present

## 2019-09-29 DIAGNOSIS — Z6837 Body mass index (BMI) 37.0-37.9, adult: Secondary | ICD-10-CM | POA: Diagnosis not present

## 2019-10-19 ENCOUNTER — Other Ambulatory Visit (HOSPITAL_COMMUNITY): Payer: Self-pay | Admitting: Family Medicine

## 2019-10-19 DIAGNOSIS — Z1231 Encounter for screening mammogram for malignant neoplasm of breast: Secondary | ICD-10-CM

## 2019-12-05 DIAGNOSIS — M79671 Pain in right foot: Secondary | ICD-10-CM | POA: Diagnosis not present

## 2019-12-07 ENCOUNTER — Other Ambulatory Visit: Payer: Self-pay | Admitting: Orthopaedic Surgery

## 2019-12-07 ENCOUNTER — Other Ambulatory Visit (HOSPITAL_COMMUNITY): Payer: Self-pay | Admitting: Orthopaedic Surgery

## 2019-12-07 DIAGNOSIS — M25571 Pain in right ankle and joints of right foot: Secondary | ICD-10-CM

## 2019-12-18 ENCOUNTER — Encounter (HOSPITAL_COMMUNITY): Payer: Self-pay | Admitting: Emergency Medicine

## 2019-12-18 ENCOUNTER — Other Ambulatory Visit: Payer: Self-pay

## 2019-12-18 ENCOUNTER — Emergency Department (HOSPITAL_COMMUNITY)
Admission: EM | Admit: 2019-12-18 | Discharge: 2019-12-18 | Disposition: A | Discharge status: Home / Self Care | Payer: Medicare Other | Attending: Emergency Medicine | Admitting: Emergency Medicine

## 2019-12-18 ENCOUNTER — Emergency Department (HOSPITAL_COMMUNITY): Payer: Medicare Other

## 2019-12-18 DIAGNOSIS — Z87891 Personal history of nicotine dependence: Secondary | ICD-10-CM | POA: Insufficient documentation

## 2019-12-18 DIAGNOSIS — R0602 Shortness of breath: Secondary | ICD-10-CM

## 2019-12-18 DIAGNOSIS — F419 Anxiety disorder, unspecified: Secondary | ICD-10-CM | POA: Diagnosis not present

## 2019-12-18 DIAGNOSIS — Z79899 Other long term (current) drug therapy: Secondary | ICD-10-CM | POA: Diagnosis not present

## 2019-12-18 DIAGNOSIS — Z7984 Long term (current) use of oral hypoglycemic drugs: Secondary | ICD-10-CM | POA: Insufficient documentation

## 2019-12-18 DIAGNOSIS — R000 Tachycardia, unspecified: Secondary | ICD-10-CM | POA: Diagnosis not present

## 2019-12-18 DIAGNOSIS — E1140 Type 2 diabetes mellitus with diabetic neuropathy, unspecified: Secondary | ICD-10-CM | POA: Insufficient documentation

## 2019-12-18 DIAGNOSIS — F410 Panic disorder [episodic paroxysmal anxiety] without agoraphobia: Secondary | ICD-10-CM | POA: Diagnosis not present

## 2019-12-18 DIAGNOSIS — I10 Essential (primary) hypertension: Secondary | ICD-10-CM | POA: Insufficient documentation

## 2019-12-18 DIAGNOSIS — R079 Chest pain, unspecified: Secondary | ICD-10-CM | POA: Diagnosis not present

## 2019-12-18 DIAGNOSIS — Z7982 Long term (current) use of aspirin: Secondary | ICD-10-CM | POA: Diagnosis not present

## 2019-12-18 LAB — BASIC METABOLIC PANEL
Anion gap: 15 (ref 5–15)
BUN: 16 mg/dL (ref 8–23)
CO2: 23 mmol/L (ref 22–32)
Calcium: 9.6 mg/dL (ref 8.9–10.3)
Chloride: 101 mmol/L (ref 98–111)
Creatinine, Ser: 0.97 mg/dL (ref 0.44–1.00)
GFR calc Af Amer: >60 mL/min (ref >60)
GFR calc non Af Amer: >60 mL/min (ref >60)
Glucose, Bld: 118 mg/dL — ABNORMAL HIGH (ref 70–99)
Potassium: 3.1 mmol/L — ABNORMAL LOW (ref 3.5–5.1)
Sodium: 139 mmol/L (ref 135–145)

## 2019-12-18 LAB — CBC WITH DIFFERENTIAL/PLATELET
Abs Immature Granulocytes: 0.01 10*3/uL (ref 0.00–0.07)
Basophils Absolute: 0.1 10*3/uL (ref 0.0–0.1)
Basophils Relative: 1 %
Eosinophils Absolute: 0.2 10*3/uL (ref 0.0–0.5)
Eosinophils Relative: 3 %
HCT: 40.6 % (ref 36.0–46.0)
Hemoglobin: 12.9 g/dL (ref 12.0–15.0)
Immature Granulocytes: 0 %
Lymphocytes Relative: 44 %
Lymphs Abs: 2.8 10*3/uL (ref 0.7–4.0)
MCH: 28.5 pg (ref 26.0–34.0)
MCHC: 31.8 g/dL (ref 30.0–36.0)
MCV: 89.8 fL (ref 80.0–100.0)
Monocytes Absolute: 0.4 10*3/uL (ref 0.1–1.0)
Monocytes Relative: 6 %
Neutro Abs: 2.9 10*3/uL (ref 1.7–7.7)
Neutrophils Relative %: 46 %
Platelets: 318 10*3/uL (ref 150–400)
RBC: 4.52 MIL/uL (ref 3.87–5.11)
RDW: 13.7 % (ref 11.5–15.5)
WBC: 6.3 10*3/uL (ref 4.0–10.5)
nRBC: 0 % (ref 0.0–0.2)

## 2019-12-18 LAB — TROPONIN I (HIGH SENSITIVITY)
Troponin I (High Sensitivity): 4 ng/L (ref <18)
Troponin I (High Sensitivity): 3 ng/L (ref <18)

## 2019-12-18 MED ORDER — LORAZEPAM 1 MG PO TABS
1.0000 mg | ORAL_TABLET | Freq: Three times a day (TID) | ORAL | 0 refills | Status: DC | PRN
Start: 2019-12-18 — End: 2020-06-25

## 2019-12-18 MED ORDER — POTASSIUM CHLORIDE CRYS ER 20 MEQ PO TBCR
40.0000 meq | EXTENDED_RELEASE_TABLET | Freq: Once | ORAL | Status: AC
  Administered 2019-12-18: 40 meq via ORAL
  Filled 2019-12-18: qty 2

## 2019-12-18 MED ORDER — LORAZEPAM 2 MG/ML IJ SOLN
1.0000 mg | Freq: Once | INTRAMUSCULAR | Status: AC
  Administered 2019-12-18: 1 mg via INTRAVENOUS
  Filled 2019-12-18: qty 1

## 2019-12-18 NOTE — Discharge Instructions (Signed)
You were seen today for shortness of breath.  Your work-up is reassuring including heart testing and x-rays.  You appear to be having an anxiety attack.  This may be related to increased stressors at home.  Make sure that you are taking care of yourself.  You will be given a short course of Ativan as needed for anxiety.  Do not drive while taking Ativan.  Follow-up closely with your primary physician.

## 2019-12-18 NOTE — ED Triage Notes (Signed)
Pt C/O mid sternal chest pain that started at 1530 yesterday.

## 2019-12-18 NOTE — ED Provider Notes (Signed)
[facility] Provider Note   CSN: [id number] Arrival date & time: 12/18/19  0503     History Chief Complaint  Patient presents with  . Chest Pain    Cheryl Ortiz is a 69 y.o. female.  HPI     This is a 69 year old female with a history of hypertension, hyperlipidemia, diabetes, renal disorder who presents with shortness of breath.  Patient is very difficult to obtain history from as she is hyperventilating and tearful.  She reports that she began to have shortness of breath yesterday.  She states repetitively that "I have to take care of my family and nothing can happen to me."  She seems to be fixated on not being able to care for her family.  She reports she has a sister with cancer and a brother that relies on her.  She is very tearful.  She denies any chest pain to me at this time.  She denies any recent fevers or illnesses but is very difficult to get her to answer directed questions.  Past Medical History:  Diagnosis Date  . Aneurysm ([place]) 06/2015   brain-small will recheck in 1 year  . Cold   . Cystocele 07/12/2015  . Hyperlipidemia   . Hypertension   . Neuropathy of both feet    tingling/numbness bilateral feet ,rt arm,hands-nerve study scheduled  . Osteoarthritis   . Renal disorder    kidney stones  . Seasonal allergies   . Type 2 diabetes mellitus [facility])     Patient Active Problem List   Diagnosis Date Noted  . Screening for colorectal cancer 12/09/2018  . 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 Snoring 06/08/2018  . Poor sleep hygiene 06/08/2018  . Chronic insomnia 06/08/2018  . Encounter for well woman exam with routine gynecological exam 09/09/2017  . Female cystocele 09/09/2017  . Cystocele 07/12/2015  . Precordial pain 02/18/2013  . Type 2 diabetes mellitus ([place]) 02/18/2013  . Hyperlipidemia 02/17/2013  . Essential hypertension, benign 02/17/2013    Past Surgical History:  Procedure Laterality Date  .  CHOLECYSTECTOMY  1999  . COLONOSCOPY N/A 06/15/2014   Procedure: COLONOSCOPY;  Surgeon: Rogene Houston, MD;  [facility];  Service: Endoscopy;  Laterality: N/A;  1145  . CYSTOSCOPY WITH RETROGRADE PYELOGRAM, URETEROSCOPY AND STENT PLACEMENT Bilateral 08/20/2015   Procedure: CYSTOSCOPY WITH RETROGRADE PYELOGRAM, URETEROSCOPY AND STENT PLACEMENT WITH STONE EXTRACTION WITH BASKET;  Surgeon: Cleon Gustin, MD;  Location: [facility];  Service: Urology;  Laterality: Bilateral;  . ESOPHAGEAL DILATION N/A 03/23/2015   Procedure: ESOPHAGEAL DILATION;  Surgeon: Rogene Houston, MD;  [facility];  Service: Endoscopy;  Laterality: N/A;  . ESOPHAGOGASTRODUODENOSCOPY N/A 03/23/2015   Procedure: ESOPHAGOGASTRODUODENOSCOPY (EGD);  Surgeon: Rogene Houston, MD;  [facility];  Service: Endoscopy;  Laterality: N/A;  8:55 - moved to 12:35 - Ann to notify pt  . FOOT SURGERY Bilateral   . HOLMIUM LASER APPLICATION Bilateral 12/23/5782   Procedure: HOLMIUM LASER APPLICATION;  Surgeon: Cleon Gustin, MD;  [facility];  Service: Urology;  Laterality: Bilateral;  . VAGINAL HYSTERECTOMY  1984     OB History    Gravida  2   Para  1   Term      Preterm      AB  1   Living  1     SAB  1   TAB      Ectopic  Multiple      Live Births  1           Family History  Problem Relation Age of Onset  . Hypertension Mother   . Arthritis Mother   . Cancer Mother        breast   . Cerebral palsy Brother   . Early death Brother   . Hypertension Brother   . Heart disease Brother   . Pneumonia Father   . Hypertension Father   . Hyperlipidemia Sister   . Fibroids Sister   . Hypertension Brother   . Heart attack Brother     Social History   Tobacco Use  . Smoking status: Former Smoker    Packs/day: 0.50    Years: 12.00    Pack years: 6.00    Types: Cigarettes    Quit date: 08/15/1997    Years since quitting:  22.3  . Smokeless tobacco: Never Used  Vaping Use  . Vaping Use: Never used  Substance Use Topics  . Alcohol use: Not Currently    Comment: occ. wine  . Drug use: No    Home Medications Prior to Admission medications   Medication Sig Start Date End Date Taking? Authorizing Provider  Albuterol (PROVENTIL IN) Inhale 2 Inhalers into the lungs 2 (two) times daily.    [provider]  aspirin 81 MG tablet Take 81 mg by mouth daily.    [provider]  hydrochlorothiazide (HYDRODIURIL) 25 MG tablet Take 25 mg by mouth daily. 10/30/17   [provider]  LORazepam (ATIVAN) 1 MG tablet Take 1 tablet (1 mg total) by mouth 3 (three) times daily as needed for anxiety. 12/18/19   Deantre Bourdon, Barbette Hair, MD  meclizine (ANTIVERT) 25 MG tablet Take 1 tablet (25 mg total) by mouth 3 (three) times daily as needed for dizziness. 09/27/19   Isla Pence, MD  metFORMIN (GLUCOPHAGE) 500 MG tablet Take 1,000 mg by mouth daily with breakfast.     [provider]  UNABLE TO FIND Herbal supplement-daily; nerve pill-unsure of name-prn    [provider]  VITAMIN D PO Take 50,000 mg by mouth once a week.    [provider]    Allergies    Codeine and Prednisone  Review of Systems   Review of Systems  Constitutional: Negative for fever.  Respiratory: Positive for shortness of breath. Negative for cough.   Cardiovascular: Negative for chest pain.  Gastrointestinal: Negative for abdominal pain, nausea and vomiting.  Psychiatric/Behavioral: The patient is nervous/anxious.   All other systems reviewed and are negative.   Physical Exam Updated Vital Signs BP (!) 139/80 (BP Location: Right Arm)   Pulse 81   Temp (!) 97.4 F (36.3 C) (Oral)   Resp (!) 24   Ht 1.676 m (5\' 6" )   Wt (!) 104.3 kg   SpO2 100%   BMI 37.12 kg/m   Physical Exam Vitals and nursing note reviewed.  Constitutional:      Appearance: She is well-developed.     Comments: Tearful,  anxious appearing, hyperventilating  HENT:     Head: Normocephalic and atraumatic.  Eyes:     Pupils: Pupils are equal, round, and reactive to light.  Cardiovascular:     Rate and Rhythm: Normal rate and regular rhythm.     Heart sounds: Normal heart sounds.  Pulmonary:     Effort: Pulmonary effort is normal. Tachypnea present. No respiratory distress.     Breath sounds: No wheezing.  Abdominal:     General: Bowel sounds are normal.     Palpations: Abdomen is soft.  Musculoskeletal:     Cervical back: Neck supple.     Right lower leg: No tenderness. No edema.     Left lower leg: No tenderness. No edema.  Skin:    General: Skin is warm and dry.  Neurological:     Mental Status: She is alert and oriented to person, place, and time.  Psychiatric:        Mood and Affect: Mood normal.     ED Results / Procedures / Treatments   Labs (all labs ordered are listed, but only abnormal results are displayed) Labs Reviewed  BASIC METABOLIC PANEL - Abnormal; Notable for the following components:      Result Value   Potassium 3.1 (*)    Glucose, Bld 118 (*)    All other components within normal limits  CBC WITH DIFFERENTIAL/PLATELET  TROPONIN I (HIGH SENSITIVITY)  TROPONIN I (HIGH SENSITIVITY)    EKG EKG Interpretation  Date/Time:  Sunday December 18 2019 05:18:11 EDT Ventricular Rate:  83 PR Interval:    QRS Duration: 100 QT Interval:  401 QTC Calculation: 472 R Axis:   1 Text Interpretation: Sinus rhythm Low voltage, extremity and precordial leads Confirmed by Thayer Jew [phone]) on 12/18/2019 6:29:33 AM   Radiology DG Chest 2 View  Result Date: 12/18/2019 CLINICAL DATA:  Mid sternal chest pain beginning yesterday afternoon. EXAM: CHEST - 2 VIEW COMPARISON:  06/18/2015 FINDINGS: Cardiac silhouette is normal in size. No mediastinal or hilar masses. No evidence of adenopathy. Clear lungs.  No pleural effusion or pneumothorax. Skeletal structures are intact. IMPRESSION: No  active cardiopulmonary disease. Electronically Signed   By: Lajean Manes M.D.   On: 12/18/2019 06:05    Procedures Procedures (including critical care time)  Medications Ordered in ED Medications  potassium chloride SA (KLOR-CON) CR tablet 40 mEq (has no administration in time range)  LORazepam (ATIVAN) injection 1 mg (1 mg Intravenous Given 12/18/19 0602)    ED Course  I have reviewed the triage vital signs and the nursing notes.  Pertinent labs & imaging results that were available during my care of the patient were reviewed by me and considered in my medical decision making (see chart for details).  Clinical Course as of Dec 17 709  Nancy Fetter Dec 18, 2019  0705 On recheck, patient appears much improved.  She reports that she feels better.  She is intermittently somnolent but this is likely related to Ativan.  Her best friend is at the bedside and states that she has been under a lot of stress.  She cares for her mother and her brother and has just found out that her sister is very sick.  I discussed with her that she needs to make sure to take care of herself so that she can take care of other people.  I will also give her a short course of Ativan as needed in case she feels panicky again.  She is advised not to drive while taking Ativan.  She and her friend stated understanding.   [CH]    Clinical Course User Index [CH] Denyla Cortese, Barbette Hair, MD   MDM Rules/Calculators/A&P                           Patient presents with shortness of breath.  She is very anxious on my evaluation.  She is tearful and  hyperventilating.  She endorses stress and refers to not being able to take care of her family if "anything happens to her."  Vital signs are notable for tachypnea.  She is 100% on room air.  She is afebrile.  Highly suspicious for an anxiety attack; however, given her age, other organic etiologies including ACS, pneumonia, pneumothorax are all considerations.  Chest x-ray without evidence of  pneumothorax or pneumonia.  EKG without acute arrhythmia or ischemic changes.  Troponin x1 -.  Basic lab work shows mild hypokalemia but otherwise no significant metabolic derangements.  On recheck, she is resting comfortably and reports significant improvement with Ativan.  See clinical course and discussion above.  After history, exam, and medical workup I feel the patient has been appropriately medically screened and is safe for discharge home. Pertinent diagnoses were discussed with the patient. Patient was given return precautions.   Final Clinical Impression(s) / ED Diagnoses Final diagnoses:  SOB (shortness of breath)  Anxiety attack    Rx / DC Orders ED Discharge Orders         Ordered    LORazepam (ATIVAN) 1 MG tablet  3 times daily PRN     Discontinue  Reprint     12/18/19 0707           Merryl Hacker, MD 12/18/19 [phone]

## 2019-12-21 ENCOUNTER — Other Ambulatory Visit: Payer: Self-pay

## 2019-12-21 ENCOUNTER — Ambulatory Visit (HOSPITAL_COMMUNITY)
Admission: RE | Admit: 2019-12-21 | Discharge: 2019-12-21 | Disposition: A | Discharge status: Home / Self Care | Payer: Medicare Other | Source: Ambulatory Visit | Attending: Family Medicine | Admitting: Family Medicine

## 2019-12-21 DIAGNOSIS — Z1231 Encounter for screening mammogram for malignant neoplasm of breast: Secondary | ICD-10-CM | POA: Diagnosis not present

## 2019-12-26 ENCOUNTER — Ambulatory Visit (HOSPITAL_COMMUNITY): Payer: Medicare Other

## 2019-12-27 ENCOUNTER — Ambulatory Visit (HOSPITAL_COMMUNITY)
Admission: RE | Admit: 2019-12-27 | Discharge: 2019-12-27 | Disposition: A | Discharge status: Home / Self Care | Payer: Medicare Other | Source: Ambulatory Visit | Attending: Orthopaedic Surgery | Admitting: Orthopaedic Surgery

## 2019-12-27 ENCOUNTER — Other Ambulatory Visit: Payer: Self-pay

## 2019-12-27 DIAGNOSIS — Z6837 Body mass index (BMI) 37.0-37.9, adult: Secondary | ICD-10-CM | POA: Diagnosis not present

## 2019-12-27 DIAGNOSIS — E876 Hypokalemia: Secondary | ICD-10-CM | POA: Diagnosis not present

## 2019-12-27 DIAGNOSIS — J4520 Mild intermittent asthma, uncomplicated: Secondary | ICD-10-CM | POA: Diagnosis not present

## 2019-12-27 DIAGNOSIS — R0602 Shortness of breath: Secondary | ICD-10-CM | POA: Diagnosis not present

## 2019-12-27 DIAGNOSIS — M19071 Primary osteoarthritis, right ankle and foot: Secondary | ICD-10-CM | POA: Diagnosis not present

## 2019-12-27 DIAGNOSIS — F411 Generalized anxiety disorder: Secondary | ICD-10-CM | POA: Diagnosis not present

## 2019-12-27 DIAGNOSIS — M25571 Pain in right ankle and joints of right foot: Secondary | ICD-10-CM | POA: Diagnosis not present

## 2020-01-04 DIAGNOSIS — R5383 Other fatigue: Secondary | ICD-10-CM | POA: Diagnosis not present

## 2020-01-04 DIAGNOSIS — Z6837 Body mass index (BMI) 37.0-37.9, adult: Secondary | ICD-10-CM | POA: Diagnosis not present

## 2020-01-04 DIAGNOSIS — I10 Essential (primary) hypertension: Secondary | ICD-10-CM | POA: Diagnosis not present

## 2020-01-04 DIAGNOSIS — E876 Hypokalemia: Secondary | ICD-10-CM | POA: Diagnosis not present

## 2020-01-04 DIAGNOSIS — Z1389 Encounter for screening for other disorder: Secondary | ICD-10-CM | POA: Diagnosis not present

## 2020-01-04 DIAGNOSIS — E119 Type 2 diabetes mellitus without complications: Secondary | ICD-10-CM | POA: Diagnosis not present

## 2020-01-04 DIAGNOSIS — Z0001 Encounter for general adult medical examination with abnormal findings: Secondary | ICD-10-CM | POA: Diagnosis not present

## 2020-01-04 DIAGNOSIS — M1990 Unspecified osteoarthritis, unspecified site: Secondary | ICD-10-CM | POA: Diagnosis not present

## 2020-01-09 DIAGNOSIS — M722 Plantar fascial fibromatosis: Secondary | ICD-10-CM | POA: Diagnosis not present

## 2020-02-27 ENCOUNTER — Ambulatory Visit (HOSPITAL_COMMUNITY)
Admission: RE | Admit: 2020-02-27 | Discharge: 2020-02-27 | Disposition: A | Discharge status: Home / Self Care | Payer: Medicare Other | Source: Ambulatory Visit | Attending: Family Medicine | Admitting: Family Medicine

## 2020-02-27 ENCOUNTER — Other Ambulatory Visit: Payer: Self-pay

## 2020-02-27 ENCOUNTER — Encounter (HOSPITAL_COMMUNITY): Payer: Self-pay

## 2020-02-27 ENCOUNTER — Other Ambulatory Visit: Payer: Self-pay | Admitting: Family Medicine

## 2020-02-27 DIAGNOSIS — N200 Calculus of kidney: Secondary | ICD-10-CM

## 2020-02-27 DIAGNOSIS — N390 Urinary tract infection, site not specified: Secondary | ICD-10-CM

## 2020-02-27 DIAGNOSIS — Z6836 Body mass index (BMI) 36.0-36.9, adult: Secondary | ICD-10-CM | POA: Diagnosis not present

## 2020-02-27 DIAGNOSIS — R509 Fever, unspecified: Secondary | ICD-10-CM | POA: Diagnosis not present

## 2020-02-28 ENCOUNTER — Ambulatory Visit (HOSPITAL_COMMUNITY)
Admission: RE | Admit: 2020-02-28 | Discharge: 2020-02-28 | Disposition: A | Discharge status: Home / Self Care | Payer: Medicare Other | Source: Ambulatory Visit | Attending: Family Medicine | Admitting: Family Medicine

## 2020-02-28 DIAGNOSIS — N200 Calculus of kidney: Secondary | ICD-10-CM | POA: Insufficient documentation

## 2020-02-28 DIAGNOSIS — N390 Urinary tract infection, site not specified: Secondary | ICD-10-CM | POA: Diagnosis not present

## 2020-02-29 DIAGNOSIS — R310 Gross hematuria: Secondary | ICD-10-CM | POA: Diagnosis not present

## 2020-02-29 DIAGNOSIS — N200 Calculus of kidney: Secondary | ICD-10-CM | POA: Diagnosis not present

## 2020-02-29 DIAGNOSIS — R300 Dysuria: Secondary | ICD-10-CM | POA: Diagnosis not present

## 2020-03-06 ENCOUNTER — Ambulatory Visit: Payer: Medicare Other | Admitting: Adult Health

## 2020-03-07 ENCOUNTER — Other Ambulatory Visit: Payer: Self-pay

## 2020-03-07 ENCOUNTER — Encounter: Payer: Self-pay | Admitting: Adult Health

## 2020-03-07 ENCOUNTER — Ambulatory Visit (INDEPENDENT_AMBULATORY_CARE_PROVIDER_SITE_OTHER): Payer: Medicare Other | Admitting: Adult Health

## 2020-03-07 VITALS — BP 118/69 | HR 83 | Ht 66.0 in | Wt 221.0 lb

## 2020-03-07 DIAGNOSIS — N8189 Other female genital prolapse: Secondary | ICD-10-CM | POA: Diagnosis not present

## 2020-03-07 DIAGNOSIS — N200 Calculus of kidney: Secondary | ICD-10-CM | POA: Diagnosis not present

## 2020-03-07 DIAGNOSIS — N3000 Acute cystitis without hematuria: Secondary | ICD-10-CM | POA: Diagnosis not present

## 2020-03-07 DIAGNOSIS — N8110 Cystocele, unspecified: Secondary | ICD-10-CM | POA: Diagnosis not present

## 2020-03-07 NOTE — Progress Notes (Signed)
  Subjective:     Patient ID: Cheryl Ortiz, female   DOB: 23-May-1950, 69 y.o.   MRN: [id number]  HPI Cheryl Ortiz is a 69 year old black female, widowed, sp hysterectomy in thinking her bladder has dropped, has known cystocele, PCP is Dr Hilma Favors.   Review of Systems Feels like bladder has dropped,  Had kidney stones 2 weeks ago Reviewed past medical,surgical, social and family history. Reviewed medications and allergies.     Objective:   Physical Exam BP 118/69 (BP Location: Left Arm, Patient Position: Sitting, Cuff Size: Large)   Pulse 83   Ht 5\' 6"  (1.676 m)   Wt 221 lb (100.2 kg)   BMI 35.67 kg/m  Skin warm and dry.Pelvic: external genitalia is normal in appearance no lesions, vagina: +cystocele and pelvic floor relaxation,urethra has no lesions or masses noted, cervix and uterus are absent, adnexa: no masses or tenderness noted. Bladder is non tender and no masses felt. On rectal exam, has good tone, no masses and no rectocele noted. Examination chaperoned by Levy Pupa LPN. Fall risk is moderate  [facility]stream - 03/07/20 1618      Pregnancy Intention Screening   Does the patient want to become pregnant in the next year? N/A    Does the patient's partner want to become pregnant in the next year? N/A    Would the patient like to discuss contraceptive options today? N/A      Contraception Wrap Up   Current Method No Method - Other Reason   hyst   End Method No Method - Other Reason   hyst   Contraception Counseling Provided No             Assessment:     1. Female cystocele Return in about 2 weeks to be fitted with pessary with Dr Elonda Husky   2. Pelvic floor relaxation Try pessary     Plan:     Return in about 2 weeks for pessary fitting with Dr Elonda Husky

## 2020-03-14 DIAGNOSIS — M25571 Pain in right ankle and joints of right foot: Secondary | ICD-10-CM | POA: Diagnosis not present

## 2020-03-26 ENCOUNTER — Ambulatory Visit: Payer: Medicare Other | Admitting: Obstetrics & Gynecology

## 2020-04-02 ENCOUNTER — Other Ambulatory Visit: Payer: Self-pay

## 2020-04-02 ENCOUNTER — Encounter: Payer: Self-pay | Admitting: Obstetrics & Gynecology

## 2020-04-02 ENCOUNTER — Ambulatory Visit (INDEPENDENT_AMBULATORY_CARE_PROVIDER_SITE_OTHER): Payer: Medicare Other | Admitting: Obstetrics & Gynecology

## 2020-04-02 VITALS — BP 128/72 | HR 77 | Ht 66.2 in | Wt 222.2 lb

## 2020-04-02 DIAGNOSIS — Z4689 Encounter for fitting and adjustment of other specified devices: Secondary | ICD-10-CM | POA: Diagnosis not present

## 2020-04-02 DIAGNOSIS — N993 Prolapse of vaginal vault after hysterectomy: Secondary | ICD-10-CM

## 2020-04-02 DIAGNOSIS — N8110 Cystocele, unspecified: Secondary | ICD-10-CM | POA: Diagnosis not present

## 2020-04-02 NOTE — Progress Notes (Signed)
Chief Complaint  Patient presents with  . pessary fitting   Original fitting encounter  Blood pressure 128/72, pulse 77, height 5' 6.2" (1.681 m), weight 222 lb 3.2 oz (100.8 kg).  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  Exam reveals no undue vaginal mucosal pressure of breakdown, no discharge and no vaginal bleeding.  The pessary is fit and left in place    Cheryl Ortiz will be sen back in 1 months for continued follow up.  Florian Buff, MD  04/02/2020 12:33 PM

## 2020-05-03 ENCOUNTER — Other Ambulatory Visit: Payer: Self-pay

## 2020-05-03 ENCOUNTER — Encounter: Payer: Self-pay | Admitting: Obstetrics & Gynecology

## 2020-05-03 ENCOUNTER — Ambulatory Visit (INDEPENDENT_AMBULATORY_CARE_PROVIDER_SITE_OTHER): Payer: Medicare Other | Admitting: Obstetrics & Gynecology

## 2020-05-03 VITALS — BP 118/67 | HR 62 | Ht 66.2 in | Wt 221.0 lb

## 2020-05-03 DIAGNOSIS — N8110 Cystocele, unspecified: Secondary | ICD-10-CM

## 2020-05-03 DIAGNOSIS — N993 Prolapse of vaginal vault after hysterectomy: Secondary | ICD-10-CM

## 2020-05-03 DIAGNOSIS — Z4689 Encounter for fitting and adjustment of other specified devices: Secondary | ICD-10-CM | POA: Diagnosis not present

## 2020-05-03 NOTE — Progress Notes (Signed)
Chief Complaint  Patient presents with  . Pessary Check    Blood pressure 118/67, pulse 62, height 5' 6.2" (1.681 m), weight 221 lb (100.2 kg).  Cheryl Ortiz presents today for routine follow up related to her pessary.   She uses a Milex ring with support #4 She reports no vaginal discharge and no vaginal bleeding   Likert scale(1 not bothersome -5 very bothersome)  :  1  Exam reveals no undue vaginal mucosal pressure of breakdown, no discharge and no vaginal bleeding.  Vaginal Epithelial Abnormality Classification System:   0 0    No abnormalities 1    Epithelial erythema 2    Granulation tissue 3    Epithelial break or erosion, 1 cm or less 4    Epithelial break or erosion, 1 cm or greater  The pessary is removed, cleaned and replaced without difficulty.      ICD-10-CM   1. Pessary maintenance, Milex ring with support #4, original fit 11/21  Z46.89   2. POP-Q stage 3 cystocele  N81.10   3. Vaginal vault prolapse after hysterectomy  N99.3      Cheryl Ortiz will be sen back in 3 months for continued follow up.  Florian Buff, MD  05/03/2020 11:53 AM

## 2020-05-07 DIAGNOSIS — E118 Type 2 diabetes mellitus with unspecified complications: Secondary | ICD-10-CM | POA: Diagnosis not present

## 2020-05-07 DIAGNOSIS — I10 Essential (primary) hypertension: Secondary | ICD-10-CM | POA: Diagnosis not present

## 2020-05-07 DIAGNOSIS — M1990 Unspecified osteoarthritis, unspecified site: Secondary | ICD-10-CM | POA: Diagnosis not present

## 2020-05-07 DIAGNOSIS — E7849 Other hyperlipidemia: Secondary | ICD-10-CM | POA: Diagnosis not present

## 2020-05-07 DIAGNOSIS — E6609 Other obesity due to excess calories: Secondary | ICD-10-CM | POA: Diagnosis not present

## 2020-05-07 DIAGNOSIS — Z6835 Body mass index (BMI) 35.0-35.9, adult: Secondary | ICD-10-CM | POA: Diagnosis not present

## 2020-05-07 DIAGNOSIS — F320 Major depressive disorder, single episode, mild: Secondary | ICD-10-CM | POA: Diagnosis not present

## 2020-05-08 DIAGNOSIS — Z23 Encounter for immunization: Secondary | ICD-10-CM | POA: Diagnosis not present

## 2020-06-22 ENCOUNTER — Other Ambulatory Visit: Payer: Self-pay

## 2020-06-22 ENCOUNTER — Encounter (HOSPITAL_COMMUNITY): Payer: Self-pay

## 2020-06-22 ENCOUNTER — Inpatient Hospital Stay (HOSPITAL_COMMUNITY)
Admission: EM | Admit: 2020-06-22 | Discharge: 2020-06-25 | DRG: 065 | Disposition: A | Discharge status: Rehabilitation Facility | Payer: Medicare Other | Attending: Internal Medicine | Admitting: Internal Medicine

## 2020-06-22 DIAGNOSIS — Z885 Allergy status to narcotic agent status: Secondary | ICD-10-CM

## 2020-06-22 DIAGNOSIS — Z8261 Family history of arthritis: Secondary | ICD-10-CM

## 2020-06-22 DIAGNOSIS — Z20822 Contact with and (suspected) exposure to covid-19: Secondary | ICD-10-CM | POA: Diagnosis not present

## 2020-06-22 DIAGNOSIS — I10 Essential (primary) hypertension: Secondary | ICD-10-CM | POA: Diagnosis present

## 2020-06-22 DIAGNOSIS — R4182 Altered mental status, unspecified: Secondary | ICD-10-CM | POA: Diagnosis not present

## 2020-06-22 DIAGNOSIS — I619 Nontraumatic intracerebral hemorrhage, unspecified: Secondary | ICD-10-CM | POA: Diagnosis present

## 2020-06-22 DIAGNOSIS — Z83438 Family history of other disorder of lipoprotein metabolism and other lipidemia: Secondary | ICD-10-CM

## 2020-06-22 DIAGNOSIS — Z888 Allergy status to other drugs, medicaments and biological substances status: Secondary | ICD-10-CM

## 2020-06-22 DIAGNOSIS — G8194 Hemiplegia, unspecified affecting left nondominant side: Secondary | ICD-10-CM | POA: Diagnosis present

## 2020-06-22 DIAGNOSIS — Z6836 Body mass index (BMI) 36.0-36.9, adult: Secondary | ICD-10-CM

## 2020-06-22 DIAGNOSIS — E669 Obesity, unspecified: Secondary | ICD-10-CM | POA: Diagnosis present

## 2020-06-22 DIAGNOSIS — I611 Nontraumatic intracerebral hemorrhage in hemisphere, cortical: Principal | ICD-10-CM | POA: Diagnosis present

## 2020-06-22 DIAGNOSIS — Z87442 Personal history of urinary calculi: Secondary | ICD-10-CM

## 2020-06-22 DIAGNOSIS — Z87891 Personal history of nicotine dependence: Secondary | ICD-10-CM

## 2020-06-22 DIAGNOSIS — F40240 Claustrophobia: Secondary | ICD-10-CM | POA: Diagnosis present

## 2020-06-22 DIAGNOSIS — E785 Hyperlipidemia, unspecified: Secondary | ICD-10-CM | POA: Diagnosis present

## 2020-06-22 DIAGNOSIS — E876 Hypokalemia: Secondary | ICD-10-CM | POA: Diagnosis not present

## 2020-06-22 DIAGNOSIS — R29700 NIHSS score 0: Secondary | ICD-10-CM | POA: Diagnosis present

## 2020-06-22 DIAGNOSIS — K5900 Constipation, unspecified: Secondary | ICD-10-CM | POA: Diagnosis present

## 2020-06-22 DIAGNOSIS — E1142 Type 2 diabetes mellitus with diabetic polyneuropathy: Secondary | ICD-10-CM | POA: Diagnosis present

## 2020-06-22 DIAGNOSIS — F5104 Psychophysiologic insomnia: Secondary | ICD-10-CM | POA: Diagnosis present

## 2020-06-22 DIAGNOSIS — Z8249 Family history of ischemic heart disease and other diseases of the circulatory system: Secondary | ICD-10-CM

## 2020-06-22 DIAGNOSIS — Z7982 Long term (current) use of aspirin: Secondary | ICD-10-CM

## 2020-06-22 DIAGNOSIS — Z7984 Long term (current) use of oral hypoglycemic drugs: Secondary | ICD-10-CM

## 2020-06-22 DIAGNOSIS — Z79899 Other long term (current) drug therapy: Secondary | ICD-10-CM

## 2020-06-22 DIAGNOSIS — J302 Other seasonal allergic rhinitis: Secondary | ICD-10-CM | POA: Diagnosis present

## 2020-06-22 LAB — CBC WITH DIFFERENTIAL/PLATELET
Abs Immature Granulocytes: 0.05 10*3/uL (ref 0.00–0.07)
Basophils Absolute: 0.1 10*3/uL (ref 0.0–0.1)
Basophils Relative: 1 %
Eosinophils Absolute: 0.1 10*3/uL (ref 0.0–0.5)
Eosinophils Relative: 1 %
HCT: 44.6 % (ref 36.0–46.0)
Hemoglobin: 14 g/dL (ref 12.0–15.0)
Immature Granulocytes: 1 %
Lymphocytes Relative: 15 %
Lymphs Abs: 1.5 10*3/uL (ref 0.7–4.0)
MCH: 29 pg (ref 26.0–34.0)
MCHC: 31.4 g/dL (ref 30.0–36.0)
MCV: 92.3 fL (ref 80.0–100.0)
Monocytes Absolute: 0.4 10*3/uL (ref 0.1–1.0)
Monocytes Relative: 4 %
Neutro Abs: 7.9 10*3/uL — ABNORMAL HIGH (ref 1.7–7.7)
Neutrophils Relative %: 78 %
Platelets: 335 10*3/uL (ref 150–400)
RBC: 4.83 MIL/uL (ref 3.87–5.11)
RDW: 13.3 % (ref 11.5–15.5)
WBC: 9.9 10*3/uL (ref 4.0–10.5)
nRBC: 0 % (ref 0.0–0.2)

## 2020-06-22 LAB — BASIC METABOLIC PANEL
Anion gap: 11 (ref 5–15)
BUN: 21 mg/dL (ref 8–23)
CO2: 25 mmol/L (ref 22–32)
Calcium: 9.9 mg/dL (ref 8.9–10.3)
Chloride: 101 mmol/L (ref 98–111)
Creatinine, Ser: 1.12 mg/dL — ABNORMAL HIGH (ref 0.44–1.00)
GFR, Estimated: 54 mL/min — ABNORMAL LOW (ref >60)
Glucose, Bld: 160 mg/dL — ABNORMAL HIGH (ref 70–99)
Potassium: 2.9 mmol/L — ABNORMAL LOW (ref 3.5–5.1)
Sodium: 137 mmol/L (ref 135–145)

## 2020-06-22 LAB — CBG MONITORING, ED: Glucose-Capillary: 157 mg/dL — ABNORMAL HIGH (ref 70–99)

## 2020-06-22 LAB — SARS CORONAVIRUS 2 BY RT PCR (HOSPITAL ORDER, PERFORMED IN ~~LOC~~ HOSPITAL LAB): SARS Coronavirus 2: NEGATIVE

## 2020-06-22 MED ORDER — POTASSIUM CHLORIDE 20 MEQ PO PACK
40.0000 meq | PACK | Freq: Every day | ORAL | Status: DC
  Administered 2020-06-22 – 2020-06-23 (×2): 40 meq via ORAL
  Filled 2020-06-22 (×3): qty 2

## 2020-06-22 MED ORDER — POTASSIUM CHLORIDE 10 MEQ/100ML IV SOLN
10.0000 meq | Freq: Once | INTRAVENOUS | Status: AC
  Administered 2020-06-22: 10 meq via INTRAVENOUS
  Filled 2020-06-22: qty 100

## 2020-06-22 NOTE — ED Notes (Signed)
Pt resting in bed talking with daughter, reports she is feeling well besides pressure under butt from lying, repositioned in bed, updated on POC, awaiting CT scan. VSS. Will continue to monitor. Personal belongings and call bell within reach. Bed locked and low. Side rails up x2.

## 2020-06-22 NOTE — ED Notes (Signed)
Pt brought in by daughter, she reports her mother is not acting like herself, denies slurred speech but that it has been random and unrelated to conversation. Pt alert and answers orientation questions x 4. Pt reports that she thinks her gait has been steady but daughter says it is not. C/O headache with sudden onset today 7/10 pain. Sensation equal bilaterally, pupil assessment unremarkable.

## 2020-06-22 NOTE — ED Triage Notes (Signed)
Pt to er, pt is awake and oriented times three; however, per family pt is confused and not acting normally.  States that pts conversation is broken.  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  Daughter states that pt started c/o a headache around 2pm, states that last known normal was around 2pm

## 2020-06-22 NOTE — ED Provider Notes (Signed)
[facility] EMERGENCY DEPARTMENT Provider Note   CSN: [id number] Arrival date & time: 06/22/20  1946     History Chief Complaint  Patient presents with  . Altered Mental Status    Cheryl Ortiz is a 70 y.o. female.  Patient presents to ER chief complaint of confusion altered mental status.  Per report family states that she went to work appearing fine today.  However the family checked on around 7 PM and felt she was not quite herself.  Family has a very difficult time explaining what this means, but feels like her choice of words is not where she would typically use and she would sometimes go on a bizarre tangent.  An example day explained is that the patient was at a funeral home and when they are picking her up she was talking about a discount she would get at the funeral home when it was her birthday.  Otherwise no reports of recent illnesses no fever no cough no vomiting no diarrhea.  Patient herself denies any pain or discomfort at this time although she did complain of a headache to the family numbers earlier on today.        Past Medical History:  Diagnosis Date  . Aneurysm ([place]) 06/2015   brain-small will recheck in 1 year  . Cold   . Cystocele 07/12/2015  . Hyperlipidemia   . Hypertension   . Neuropathy of both feet    tingling/numbness bilateral feet ,rt arm,hands-nerve study scheduled  . Osteoarthritis   . Renal disorder    kidney stones  . Seasonal allergies   . Type 2 diabetes mellitus [facility])     Patient Active Problem List   Diagnosis Date Noted  . Pelvic floor relaxation 03/07/2020  . Screening for colorectal cancer 12/09/2018  . 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 Snoring 06/08/2018  . Poor sleep hygiene 06/08/2018  . Chronic insomnia 06/08/2018  . Encounter for well woman exam with routine gynecological exam 09/09/2017  . Female cystocele 09/09/2017  . Cystocele 07/12/2015  . Precordial pain 02/18/2013  . Type 2 diabetes  mellitus ([place]) 02/18/2013  . Hyperlipidemia 02/17/2013  . Essential hypertension, benign 02/17/2013    Past Surgical History:  Procedure Laterality Date  . CHOLECYSTECTOMY  1999  . COLONOSCOPY N/A 06/15/2014   Procedure: COLONOSCOPY;  Surgeon: Rogene Houston, MD;  Location[facility];  Service: Endoscopy;  Laterality: N/A;  1145  . CYSTOSCOPY WITH RETROGRADE PYELOGRAM, URETEROSCOPY AND STENT PLACEMENT Bilateral 08/20/2015   Procedure: CYSTOSCOPY WITH RETROGRADE PYELOGRAM, URETEROSCOPY AND STENT PLACEMENT WITH STONE EXTRACTION WITH BASKET;  Surgeon: Cleon Gustin, MD;  Location: [facility];  Service: Urology;  Laterality: Bilateral;  . ESOPHAGEAL DILATION N/A 03/23/2015   Procedure: ESOPHAGEAL DILATION;  Surgeon: Rogene Houston, MD;  Location: [facility];  Service: Endoscopy;  Laterality: N/A;  . ESOPHAGOGASTRODUODENOSCOPY N/A 03/23/2015   Procedure: ESOPHAGOGASTRODUODENOSCOPY (EGD);  Surgeon: Rogene Houston, MD;  Location: [facility];  Service: Endoscopy;  Laterality: N/A;  8:55 - moved to 12:35 - Ann to notify pt  . FOOT SURGERY Bilateral   . HOLMIUM LASER APPLICATION Bilateral [id number]   Procedure: HOLMIUM LASER APPLICATION;  Surgeon: Cleon Gustin, MD;  Location: [facility];  Service: Urology;  Laterality: Bilateral;  . VAGINAL HYSTERECTOMY  1984     OB History    Gravida  2   Para  1   Term      Preterm  AB  1   Living  1     SAB  1   IAB      Ectopic      Multiple      Live Births  1           Family History  Problem Relation Age of Onset  . Hypertension Mother   . Arthritis Mother   . Cancer Mother        breast   . Cerebral palsy Brother   . Early death Brother   . Hypertension Brother   . Heart disease Brother   . Pneumonia Father   . Hypertension Father   . Hyperlipidemia Sister   . Breast cancer Sister   . Fibroids Sister   . Hypertension Brother   . Heart attack Brother     Social  History   Tobacco Use  . Smoking status: Former Smoker    Packs/day: 0.50    Years: 12.00    Pack years: 6.00    Types: Cigarettes    Quit date: 08/15/1997    Years since quitting: 22.8  . Smokeless tobacco: Never Used  Vaping Use  . Vaping Use: Never used  Substance Use Topics  . Alcohol use: Not Currently  . Drug use: No    Home Medications Prior to Admission medications   Medication Sig Start Date End Date Taking? Authorizing Provider  Albuterol (PROVENTIL IN) Inhale 2 Inhalers into the lungs daily as needed (shortness of breath).   Yes [provider]  aspirin 81 MG tablet Take 81 mg by mouth daily.   Yes [provider]  Aspirin-Acetaminophen-Caffeine (EXCEDRIN EXTRA STRENGTH PO) Take 2 tablets by mouth daily as needed (headache).   Yes [provider]  hydrochlorothiazide (HYDRODIURIL) 25 MG tablet Take 25 mg by mouth daily. 10/30/17  Yes [provider]  metFORMIN (GLUCOPHAGE) 500 MG tablet Take 1,000 mg by mouth daily with breakfast.    Yes [provider]  PENNSAID 2 % SOLN Apply 2 Pump topically in the morning and at bedtime. 03/14/20  Yes [provider]  albuterol (VENTOLIN HFA) 108 (90 Base) MCG/ACT inhaler 2 puffs as needed    [provider]  LORazepam (ATIVAN) 1 MG tablet Take 1 tablet (1 mg total) by mouth 3 (three) times daily as needed for anxiety. Patient not taking: No sig reported 12/18/19   Horton, Barbette Hair, MD  UNABLE TO FIND Herbal supplement-daily Patient not taking: No sig reported    [provider]  VITAMIN D PO Take 50,000 mg by mouth once a week. Patient not taking: No sig reported    [provider]    Allergies    Codeine and Prednisone  Review of Systems   Review of Systems  Constitutional: Negative for fever.  HENT: Negative for ear pain.   Eyes: Negative for pain.  Respiratory: Negative for cough.   Cardiovascular: Negative for chest pain.  Gastrointestinal:  Negative for abdominal pain.  Genitourinary: Negative for flank pain.  Musculoskeletal: Negative for back pain.  Skin: Negative for rash.  Neurological: Negative for headaches.    Physical Exam Updated Vital Signs BP 108/69   Pulse 75   Temp 98.6 F (37 C) (Oral)   Resp (!) 24   Ht 5\' 6"  (1.676 m)   Wt 102.5 kg   SpO2 100%   BMI 36.48 kg/m   Physical Exam Constitutional:      General: She is not in acute distress.  Appearance: Normal appearance.  HENT:     Head: Normocephalic.     Nose: Nose normal.  Eyes:     Extraocular Movements: Extraocular movements intact.  Cardiovascular:     Rate and Rhythm: Normal rate.  Pulmonary:     Effort: Pulmonary effort is normal.  Musculoskeletal:        General: Normal range of motion.     Cervical back: Normal range of motion.  Neurological:     General: No focal deficit present.     Mental Status: She is alert and oriented to person, place, and time. Mental status is at baseline.     Comments: Patient is alert and oriented x3.  No cranial nerve deficit noted.  5/5 strength all extremities.  Gait is slightly slower than her baseline per family but otherwise normal unassisted.     ED Results / Procedures / Treatments   Labs (all labs ordered are listed, but only abnormal results are displayed) Labs Reviewed  BASIC METABOLIC PANEL - Abnormal; Notable for the following components:      Result Value   Potassium 2.9 (*)    Glucose, Bld 160 (*)    Creatinine, Ser 1.12 (*)    GFR, Estimated 54 (*)    All other components within normal limits  CBC WITH DIFFERENTIAL/PLATELET - Abnormal; Notable for the following components:   Neutro Abs 7.9 (*)    All other components within normal limits  CBG MONITORING, ED - Abnormal; Notable for the following components:   Glucose-Capillary 157 (*)    All other components within normal limits  SARS CORONAVIRUS 2 BY RT PCR (HOSPITAL ORDER, [facility])     EKG None  Radiology No results found.  Procedures Procedures   Medications Ordered in ED Medications  potassium chloride (KLOR-CON) packet 40 mEq (40 mEq Oral Given 06/22/20 2141)  potassium chloride 10 mEq in 100 mL IVPB (0 mEq Intravenous Stopped 06/22/20 2245)    ED Course  I have reviewed the triage vital signs and the nursing notes.  Pertinent labs & imaging results that were available during my care of the patient were reviewed by me and considered in my medical decision making (see chart for details).    MDM Rules/Calculators/A&P                          Etiology the patient symptoms unclear.  She does have a slightly flatter affect than 1 would expect, however she states that she just feels tired.  Family is convinced that her speech pattern and personality are significantly changed acutely.  Imaging will be pursued.  Case discussed with Dr.Haviland for transfer to [facility] for MRI imaging.   Final Clinical Impression(s) / ED Diagnoses Final diagnoses:  Altered mental status, unspecified altered mental status type    Rx / DC Orders ED Discharge Orders    None       Luna Fuse, MD 06/23/20 [phone]

## 2020-06-23 ENCOUNTER — Emergency Department (HOSPITAL_COMMUNITY): Payer: Medicare Other

## 2020-06-23 ENCOUNTER — Other Ambulatory Visit (HOSPITAL_COMMUNITY): Payer: Medicare Other

## 2020-06-23 ENCOUNTER — Inpatient Hospital Stay (HOSPITAL_COMMUNITY): Payer: Medicare Other

## 2020-06-23 DIAGNOSIS — G936 Cerebral edema: Secondary | ICD-10-CM

## 2020-06-23 DIAGNOSIS — R2689 Other abnormalities of gait and mobility: Secondary | ICD-10-CM | POA: Diagnosis present

## 2020-06-23 DIAGNOSIS — Z885 Allergy status to narcotic agent status: Secondary | ICD-10-CM | POA: Diagnosis not present

## 2020-06-23 DIAGNOSIS — R7309 Other abnormal glucose: Secondary | ICD-10-CM | POA: Diagnosis not present

## 2020-06-23 DIAGNOSIS — Z79899 Other long term (current) drug therapy: Secondary | ICD-10-CM | POA: Diagnosis not present

## 2020-06-23 DIAGNOSIS — R41840 Attention and concentration deficit: Secondary | ICD-10-CM | POA: Diagnosis not present

## 2020-06-23 DIAGNOSIS — G8194 Hemiplegia, unspecified affecting left nondominant side: Secondary | ICD-10-CM | POA: Diagnosis present

## 2020-06-23 DIAGNOSIS — Z888 Allergy status to other drugs, medicaments and biological substances status: Secondary | ICD-10-CM | POA: Diagnosis not present

## 2020-06-23 DIAGNOSIS — E876 Hypokalemia: Secondary | ICD-10-CM

## 2020-06-23 DIAGNOSIS — E1159 Type 2 diabetes mellitus with other circulatory complications: Secondary | ICD-10-CM | POA: Diagnosis not present

## 2020-06-23 DIAGNOSIS — F40240 Claustrophobia: Secondary | ICD-10-CM | POA: Diagnosis present

## 2020-06-23 DIAGNOSIS — M79672 Pain in left foot: Secondary | ICD-10-CM | POA: Diagnosis not present

## 2020-06-23 DIAGNOSIS — Z8261 Family history of arthritis: Secondary | ICD-10-CM | POA: Diagnosis not present

## 2020-06-23 DIAGNOSIS — L89322 Pressure ulcer of left buttock, stage 2: Secondary | ICD-10-CM | POA: Diagnosis not present

## 2020-06-23 DIAGNOSIS — Z87891 Personal history of nicotine dependence: Secondary | ICD-10-CM | POA: Diagnosis not present

## 2020-06-23 DIAGNOSIS — Z8249 Family history of ischemic heart disease and other diseases of the circulatory system: Secondary | ICD-10-CM | POA: Diagnosis not present

## 2020-06-23 DIAGNOSIS — R0989 Other specified symptoms and signs involving the circulatory and respiratory systems: Secondary | ICD-10-CM | POA: Diagnosis not present

## 2020-06-23 DIAGNOSIS — E1140 Type 2 diabetes mellitus with diabetic neuropathy, unspecified: Secondary | ICD-10-CM | POA: Diagnosis present

## 2020-06-23 DIAGNOSIS — F5104 Psychophysiologic insomnia: Secondary | ICD-10-CM | POA: Diagnosis present

## 2020-06-23 DIAGNOSIS — I10 Essential (primary) hypertension: Secondary | ICD-10-CM | POA: Diagnosis present

## 2020-06-23 DIAGNOSIS — I69198 Other sequelae of nontraumatic intracerebral hemorrhage: Secondary | ICD-10-CM | POA: Diagnosis not present

## 2020-06-23 DIAGNOSIS — I69154 Hemiplegia and hemiparesis following nontraumatic intracerebral hemorrhage affecting left non-dominant side: Secondary | ICD-10-CM | POA: Diagnosis present

## 2020-06-23 DIAGNOSIS — Z7984 Long term (current) use of oral hypoglycemic drugs: Secondary | ICD-10-CM | POA: Diagnosis not present

## 2020-06-23 DIAGNOSIS — H748X1 Other specified disorders of right middle ear and mastoid: Secondary | ICD-10-CM | POA: Diagnosis not present

## 2020-06-23 DIAGNOSIS — I619 Nontraumatic intracerebral hemorrhage, unspecified: Secondary | ICD-10-CM | POA: Diagnosis not present

## 2020-06-23 DIAGNOSIS — Z23 Encounter for immunization: Secondary | ICD-10-CM | POA: Diagnosis present

## 2020-06-23 DIAGNOSIS — Z803 Family history of malignant neoplasm of breast: Secondary | ICD-10-CM | POA: Diagnosis not present

## 2020-06-23 DIAGNOSIS — I69190 Apraxia following nontraumatic intracerebral hemorrhage: Secondary | ICD-10-CM | POA: Diagnosis not present

## 2020-06-23 DIAGNOSIS — I618 Other nontraumatic intracerebral hemorrhage: Secondary | ICD-10-CM | POA: Diagnosis not present

## 2020-06-23 DIAGNOSIS — Z6836 Body mass index (BMI) 36.0-36.9, adult: Secondary | ICD-10-CM | POA: Diagnosis not present

## 2020-06-23 DIAGNOSIS — I69114 Frontal lobe and executive function deficit following nontraumatic intracerebral hemorrhage: Secondary | ICD-10-CM | POA: Diagnosis not present

## 2020-06-23 DIAGNOSIS — I161 Hypertensive emergency: Secondary | ICD-10-CM

## 2020-06-23 DIAGNOSIS — J302 Other seasonal allergic rhinitis: Secondary | ICD-10-CM | POA: Diagnosis present

## 2020-06-23 DIAGNOSIS — R4182 Altered mental status, unspecified: Secondary | ICD-10-CM | POA: Diagnosis not present

## 2020-06-23 DIAGNOSIS — M25572 Pain in left ankle and joints of left foot: Secondary | ICD-10-CM | POA: Diagnosis not present

## 2020-06-23 DIAGNOSIS — E1142 Type 2 diabetes mellitus with diabetic polyneuropathy: Secondary | ICD-10-CM | POA: Diagnosis present

## 2020-06-23 DIAGNOSIS — I611 Nontraumatic intracerebral hemorrhage in hemisphere, cortical: Secondary | ICD-10-CM | POA: Diagnosis present

## 2020-06-23 DIAGNOSIS — R29818 Other symptoms and signs involving the nervous system: Secondary | ICD-10-CM | POA: Diagnosis not present

## 2020-06-23 DIAGNOSIS — R29700 NIHSS score 0: Secondary | ICD-10-CM | POA: Diagnosis present

## 2020-06-23 DIAGNOSIS — E1165 Type 2 diabetes mellitus with hyperglycemia: Secondary | ICD-10-CM | POA: Diagnosis not present

## 2020-06-23 DIAGNOSIS — K5900 Constipation, unspecified: Secondary | ICD-10-CM | POA: Diagnosis present

## 2020-06-23 DIAGNOSIS — Z7982 Long term (current) use of aspirin: Secondary | ICD-10-CM | POA: Diagnosis not present

## 2020-06-23 DIAGNOSIS — Z20822 Contact with and (suspected) exposure to covid-19: Secondary | ICD-10-CM | POA: Diagnosis present

## 2020-06-23 DIAGNOSIS — I952 Hypotension due to drugs: Secondary | ICD-10-CM | POA: Diagnosis not present

## 2020-06-23 DIAGNOSIS — E669 Obesity, unspecified: Secondary | ICD-10-CM | POA: Diagnosis present

## 2020-06-23 DIAGNOSIS — I6389 Other cerebral infarction: Secondary | ICD-10-CM | POA: Diagnosis not present

## 2020-06-23 DIAGNOSIS — I6523 Occlusion and stenosis of bilateral carotid arteries: Secondary | ICD-10-CM | POA: Diagnosis not present

## 2020-06-23 DIAGNOSIS — Z83438 Family history of other disorder of lipoprotein metabolism and other lipidemia: Secondary | ICD-10-CM | POA: Diagnosis not present

## 2020-06-23 DIAGNOSIS — Z8679 Personal history of other diseases of the circulatory system: Secondary | ICD-10-CM | POA: Diagnosis not present

## 2020-06-23 DIAGNOSIS — E785 Hyperlipidemia, unspecified: Secondary | ICD-10-CM | POA: Diagnosis present

## 2020-06-23 DIAGNOSIS — S06360A Traumatic hemorrhage of cerebrum, unspecified, without loss of consciousness, initial encounter: Secondary | ICD-10-CM | POA: Diagnosis not present

## 2020-06-23 DIAGNOSIS — I69119 Unspecified symptoms and signs involving cognitive functions following nontraumatic intracerebral hemorrhage: Secondary | ICD-10-CM | POA: Diagnosis not present

## 2020-06-23 DIAGNOSIS — I610 Nontraumatic intracerebral hemorrhage in hemisphere, subcortical: Secondary | ICD-10-CM | POA: Diagnosis not present

## 2020-06-23 DIAGNOSIS — Z87442 Personal history of urinary calculi: Secondary | ICD-10-CM | POA: Diagnosis not present

## 2020-06-23 LAB — PROTIME-INR
INR: 1 (ref 0.8–1.2)
INR: 1.1 (ref 0.8–1.2)
Prothrombin Time: 13.1 seconds (ref 11.4–15.2)
Prothrombin Time: 13.6 seconds (ref 11.4–15.2)

## 2020-06-23 LAB — GLUCOSE, CAPILLARY
Glucose-Capillary: 126 mg/dL — ABNORMAL HIGH (ref 70–99)
Glucose-Capillary: 125 mg/dL — ABNORMAL HIGH (ref 70–99)
Glucose-Capillary: 115 mg/dL — ABNORMAL HIGH (ref 70–99)
Glucose-Capillary: 135 mg/dL — ABNORMAL HIGH (ref 70–99)

## 2020-06-23 LAB — SODIUM: Sodium: 139 mmol/L (ref 135–145)

## 2020-06-23 LAB — CBC
HCT: 40.6 % (ref 36.0–46.0)
Hemoglobin: 13.1 g/dL (ref 12.0–15.0)
MCH: 29.4 pg (ref 26.0–34.0)
MCHC: 32.3 g/dL (ref 30.0–36.0)
MCV: 91.2 fL (ref 80.0–100.0)
Platelets: 285 10*3/uL (ref 150–400)
RBC: 4.45 MIL/uL (ref 3.87–5.11)
RDW: 13.5 % (ref 11.5–15.5)
WBC: 6.9 10*3/uL (ref 4.0–10.5)
nRBC: 0 % (ref 0.0–0.2)

## 2020-06-23 LAB — LIPID PANEL
Cholesterol: 133 mg/dL (ref 0–200)
HDL: 46 mg/dL (ref >40)
LDL Cholesterol: 66 mg/dL (ref 0–99)
Total CHOL/HDL Ratio: 2.9 RATIO
Triglycerides: 103 mg/dL (ref <150)
VLDL: 21 mg/dL (ref 0–40)

## 2020-06-23 LAB — RAPID URINE DRUG SCREEN, HOSP PERFORMED
Amphetamines: NOT DETECTED
Barbiturates: NOT DETECTED
Benzodiazepines: NOT DETECTED
Cocaine: NOT DETECTED
Opiates: NOT DETECTED
Tetrahydrocannabinol: NOT DETECTED

## 2020-06-23 LAB — BASIC METABOLIC PANEL
Anion gap: 12 (ref 5–15)
BUN: 16 mg/dL (ref 8–23)
CO2: 22 mmol/L (ref 22–32)
Calcium: 9.2 mg/dL (ref 8.9–10.3)
Chloride: 102 mmol/L (ref 98–111)
Creatinine, Ser: 0.97 mg/dL (ref 0.44–1.00)
GFR, Estimated: >60 mL/min (ref >60)
Glucose, Bld: 158 mg/dL — ABNORMAL HIGH (ref 70–99)
Potassium: 3.2 mmol/L — ABNORMAL LOW (ref 3.5–5.1)
Sodium: 136 mmol/L (ref 135–145)

## 2020-06-23 LAB — APTT
aPTT: 26 seconds (ref 24–36)
aPTT: 28 seconds (ref 24–36)

## 2020-06-23 LAB — MRSA PCR SCREENING: MRSA by PCR: NEGATIVE

## 2020-06-23 LAB — HIV ANTIBODY (ROUTINE TESTING W REFLEX): HIV Screen 4th Generation wRfx: NONREACTIVE

## 2020-06-23 LAB — HEMOGLOBIN A1C
Hgb A1c MFr Bld: 6.2 % — ABNORMAL HIGH (ref 4.8–5.6)
Mean Plasma Glucose: 131.24 mg/dL

## 2020-06-23 MED ORDER — ACETAMINOPHEN 325 MG PO TABS
650.0000 mg | ORAL_TABLET | ORAL | Status: DC | PRN
  Administered 2020-06-23: 650 mg via ORAL
  Filled 2020-06-23 (×2): qty 2

## 2020-06-23 MED ORDER — ACETAMINOPHEN 650 MG RE SUPP: 650.0000 mg | RECTAL | Status: DC | PRN

## 2020-06-23 MED ORDER — IOHEXOL 350 MG/ML SOLN
100.0000 mL | Freq: Once | INTRAVENOUS | Status: AC | PRN
  Administered 2020-06-23: 100 mL via INTRAVENOUS

## 2020-06-23 MED ORDER — INSULIN ASPART 100 UNIT/ML ~~LOC~~ SOLN: 5.0000 [IU] | Freq: Once | SUBCUTANEOUS | Status: DC

## 2020-06-23 MED ORDER — LABETALOL HCL 5 MG/ML IV SOLN: 10.0000 mg | INTRAVENOUS | Status: DC | PRN

## 2020-06-23 MED ORDER — STROKE: EARLY STAGES OF RECOVERY BOOK: Freq: Once | Status: DC

## 2020-06-23 MED ORDER — INSULIN ASPART 100 UNIT/ML ~~LOC~~ SOLN
0.0000 [IU] | Freq: Three times a day (TID) | SUBCUTANEOUS | Status: DC
  Administered 2020-06-23 (×2): 2 [IU] via SUBCUTANEOUS
  Administered 2020-06-24: 3 [IU] via SUBCUTANEOUS
  Administered 2020-06-25: 2 [IU] via SUBCUTANEOUS

## 2020-06-23 MED ORDER — CHLORHEXIDINE GLUCONATE CLOTH 2 % EX PADS
6.0000 | MEDICATED_PAD | Freq: Every day | CUTANEOUS | Status: DC
  Administered 2020-06-23 – 2020-06-24 (×2): 6 via TOPICAL

## 2020-06-23 MED ORDER — SODIUM CHLORIDE 3 % IV SOLN
INTRAVENOUS | Status: DC
  Filled 2020-06-23: qty 500

## 2020-06-23 MED ORDER — PANTOPRAZOLE SODIUM 40 MG PO TBEC
40.0000 mg | DELAYED_RELEASE_TABLET | Freq: Every day | ORAL | Status: DC
  Administered 2020-06-23 – 2020-06-25 (×3): 40 mg via ORAL
  Filled 2020-06-23 (×3): qty 1

## 2020-06-23 MED ORDER — PANTOPRAZOLE SODIUM 40 MG IV SOLR: 40.0000 mg | Freq: Every day | INTRAVENOUS | Status: DC

## 2020-06-23 MED ORDER — OXYCODONE HCL 5 MG PO TABS: 5.0000 mg | ORAL_TABLET | Freq: Once | ORAL | Status: DC

## 2020-06-23 MED ORDER — INSULIN ASPART 100 UNIT/ML ~~LOC~~ SOLN
4.0000 [IU] | Freq: Three times a day (TID) | SUBCUTANEOUS | Status: DC
  Administered 2020-06-23 – 2020-06-25 (×8): 4 [IU] via SUBCUTANEOUS

## 2020-06-23 MED ORDER — ACETAMINOPHEN 160 MG/5ML PO SOLN: 650.0000 mg | ORAL | Status: DC | PRN

## 2020-06-23 MED ORDER — SENNOSIDES-DOCUSATE SODIUM 8.6-50 MG PO TABS: 1.0000 | ORAL_TABLET | Freq: Two times a day (BID) | ORAL | Status: DC

## 2020-06-23 MED ORDER — GADOBUTROL 1 MMOL/ML IV SOLN
9.0000 mL | Freq: Once | INTRAVENOUS | Status: AC | PRN
  Administered 2020-06-23: 9 mL via INTRAVENOUS

## 2020-06-23 NOTE — Progress Notes (Addendum)
Cheryl Falco, NP paged through [facility] at this time regarding new order of 5u rapid acting insulin. Patient's most recent CBG at 2200 was 135. Just wanted to double check since patient does get CBG ACHS. Will follow up.   2300: Order was put in by error. Disregard, per NP.

## 2020-06-23 NOTE — Progress Notes (Signed)
STROKE TEAM PROGRESS NOTE   INTERVAL HISTORY No family is at the bedside.  Pt awake alert lying in bed, fully orientated. Mild flat affect but interactive and follows all simple commands. MRI pending. Pt claustrophobic but declined pre-medication for MRI. Denies HA.    OBJECTIVE Vitals:   06/23/20 0531 06/23/20 0600 06/23/20 0642 06/23/20 0700  BP: 135/62  137/74 120/78  Pulse: 65 (!) 59 69 (!) 57  Resp: 17 13 (!) 21 17  Temp:      TempSrc:      SpO2: 100% 99% 97% 100%  Weight:      Height:        CBC:  Recent Labs  Lab 06/22/20 1918  WBC 9.9  NEUTROABS 7.9*  HGB 14.0  HCT 44.6  MCV 92.3  PLT 409    Basic Metabolic Panel:  Recent Labs  Lab 06/22/20 1918 06/23/20 0607  NA 137 139  K 2.9*  --   CL 101  --   CO2 25  --   GLUCOSE 160*  --   BUN 21  --   CREATININE 1.12*  --   CALCIUM 9.9  --     Lipid Panel:     Component Value Date/Time   CHOL 133 06/23/2020 0607   TRIG 103 06/23/2020 0607   HDL 46 06/23/2020 0607   CHOLHDL 2.9 06/23/2020 0607   VLDL 21 06/23/2020 0607   LDLCALC 66 06/23/2020 0607   HgbA1c:  Lab Results  Component Value Date   HGBA1C 6.2 (H) 06/23/2020   Urine Drug Screen:     Component Value Date/Time   LABOPIA NONE DETECTED 09/27/2019 1209   COCAINSCRNUR NONE DETECTED 09/27/2019 1209   LABBENZ NONE DETECTED 09/27/2019 1209   AMPHETMU NONE DETECTED 09/27/2019 1209   THCU NONE DETECTED 09/27/2019 1209   LABBARB NONE DETECTED 09/27/2019 1209    Alcohol Level     Component Value Date/Time   ETH <10 09/27/2019 1108    IMAGING  CT Angio Head W or Wo Contrast  06/23/2020 IMPRESSION:  1. No sign of high flow vascular malformation in the region of the right frontal hemorrhage. No sign of venous thrombosis. Cortical venous thrombosis can be difficult to see by CT.  2. No change in the appearance of the right frontal lobe intraparenchymal hemorrhage with mild surrounding edema. No mass effect or shift.   CT Head Wo  Contrast 06/23/2020 IMPRESSION:  Area of hemorrhage within the anteromedial right frontal lobe. Adjacent small amount of blood to the left side of the falx could be within the left frontal lobe or subarachnoid blood. No mass effect or midline shift.  CT Angio Neck W and/or Wo Contrast 06/23/2020 IMPRESSION:  1. Minimal calcified plaque at the right ICA bulb but no stenosis.  2. No left carotid bifurcation disease.  3. Both vertebral arteries appear normal through the cervical region to the foramen magnum.  4. Aortic atherosclerosis. Aortic Atherosclerosis (ICD10-I70.0).   CT VENOGRAM HEAD 06/23/2020 IMPRESSION:  No visible venous thrombosis. Superficial venous thrombosis can be difficult to see by CT.   MRI BRAIN W / WO CONTRAST - pending  Transthoracic Echocardiogram  Pending  ECG - SR rate 70 BPM with PACs (See cardiology reading for complete details)   PHYSICAL EXAM Temp:  [97.9 F (36.6 C)-98.7 F (37.1 C)] 97.9 F (36.6 C) (02/05 0800) Pulse Rate:  [57-92] 72 (02/05 0800) Resp:  [11-24] 20 (02/05 0800) BP: (104-142)/(54-131) 104/63 (02/05 0800) SpO2:  [96 %-100 %] 100 % (  02/05 0800) Weight:  [102.5 kg] 102.5 kg (02/04 1955)  General - Well nourished, well developed, in no apparent distress, mild flat affect, frequent yawning.  Ophthalmologic - fundi not visualized due to noncooperation.  Cardiovascular - Regular rhythm and rate.  Mental Status -  Level of arousal and orientation to time, place, and person were intact. Language including expression, naming, repetition, comprehension was assessed and found intact. Fund of Knowledge was assessed and was intact.  Cranial Nerves II - XII - II - Visual field intact OU. III, IV, VI - Extraocular movements intact. V - Facial sensation intact bilaterally. VII - mild left facial droop. VIII - Hearing & vestibular intact bilaterally. X - Palate elevates symmetrically. XI - Chin turning & shoulder shrug intact  bilaterally. XII - Tongue protrusion intact.  Motor Strength - The patient's strength was normal in right upper and lower extremities, however, left UE 3/5 proximal and 4/5 bicep tricep and 4+/5 finger grip, left LE proximal and distal 4/5.  Bulk was normal and fasciculations were absent.   Motor Tone - Muscle tone was assessed at the neck and appendages and was normal.  Reflexes - The patient's reflexes were symmetrical in all extremities and she had no pathological reflexes.  Sensory - Light touch, temperature/pinprick were assessed and were symmetrical.    Coordination - The patient had normal movements in the hands with no ataxia or dysmetria.  Tremor was absent.  Gait and Station - deferred.    ASSESSMENT/PLAN Cheryl Ortiz is a 70 y.o. 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  She did not receive IV t-PA due to [place].  ICH - right frontal lobe ICH, etiology unclear  CT head - Area of hemorrhage within the anteromedial right frontal lobe. Adjacent small amount of blood to the left side of the falx could be within the left frontal lobe or subarachnoid blood. No mass effect or midline shift.  CTA Head and neck - unremarkable.   CT Venogram Head - No visible venous thrombosis.   MRI head W/WO - pending  2D Echo - pending  Sars Corona Virus 2 - negative  LDL - 66  HgbA1c - 6.2  UDS neg  VTE prophylaxis - SCDs  aspirin 81 mg daily prior to admission, now on No antithrombotic due to [place]  Ongoing aggressive stroke risk factor management  Therapy recommendations:  CIR  Disposition:  Pending  Hypertension  Home BP meds: HCTZ  Current BP meds: Labetalol prn  Stable . SBP goal < 160 mm Hg. . Long-term BP goal normotensive  Diabetes  Home diabetic meds: metformin  HgbA1c 6.2,  goal < 7.0  SSI  CBG monitoring  Close PCP follow-up   Other Stroke Risk Factors  Advanced age  Former cigarette smoker - quit > 22 years ago  Previous ETOH use  Obesity, Body mass index is 36.48 kg/m., recommend weight loss, diet and exercise as appropriate   Other Active Problems, Findings, Recommendations and/or Plan  Code status - Full code Hypokalemia - potassium - 2.9->3.2 - supplement  Aortic Atherosclerosis (ICD10-I70.0).   Hospital day # 0  This patient is critically ill due to [place], hypertensive emergency, hypokalemia and at significant risk of neurological worsening, death form hematoma expansion, brain herniation, hypertensive encephalopathy, arrhythmia. This  patient's care requires constant monitoring of vital signs, hemodynamics, respiratory and cardiac monitoring, review of multiple databases, neurological assessment, discussion with family, other specialists and medical decision making of high complexity. I spent 35 minutes of neurocritical care time in the care of this patient.  Rosalin Hawking, MD PhD Stroke Neurology 06/23/2020 7:14 PM   To contact Stroke Continuity provider, please refer to [email or web address] After hours, contact General Neurology

## 2020-06-23 NOTE — ED Provider Notes (Signed)
Patient signed out to me by Dr. Almyra Free with CT scanning pending.  Family brought patient to the emergency department because of confusion and strange behavior that was very unusual for the patient.  This was in the setting of acute headache.  There was a significant delay in obtaining the CT secondary to slow throughput in radiology.  Study has finally been performed and does show a right frontal hemorrhage.  Discussed with Dr. Lorrin Goodell, on-call for neurologist.  Requests CT angio head, neck and CT venogram to further evaluate.  Recommends teleneurology evaluation and then transferred to [facility] for further management.  Recheck of the patient reveals that she is awake, alert.  She has a very slight headache.  Vital signs are normal.  She has no focal deficits.  She is carrying on conversations without difficulty.    Orpah Greek, MD 06/23/20 [phone]

## 2020-06-23 NOTE — Evaluation (Signed)
Physical Therapy Evaluation Patient Details Name: Cheryl Ortiz MRN: [id number] DOB: Oct 06, 1950 Today's Date: 06/23/2020   History of Present Illness  The pt is a 70 yo female presenting with L-sided weakness and flat affect. Imaging revealed R frontal lobe ICH. PMH includes: aneurysm, HLD, HTN, bilateral neuropathy (feet), DM II, and kidney stones.    Clinical Impression  Pt in bed upon arrival of PT, agreeable to evaluation at this time. 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 The pt now presents with limitations in functional mobility, strength, stability, power, and safety awareness due to above dx, and will continue to benefit from skilled PT to address these deficits. The pt was able to follow commands inconsistently through session, improved adherence with multimodal, repeated cues for sequencing, positioning, and to attend to LUE. The pt was able to complete initial bed mobility and sit-stand transfer with mod-minA of 1, but requires increased time and assist to power up and steady due to poor strength, power, and awareness/problem solving with mobility. The pt requires increased assist to complete short bouts of ambulation in the room with use of RW, but will benefit from skilled PT to address these deficits. Based on the pt's goals and prior level of function, recommend CIR level therapies to facilitate return to independence.      Follow Up Recommendations CIR    Equipment Recommendations   (defer to post acute)    Recommendations for Other Services Rehab consult     Precautions / Restrictions Precautions Precautions: Fall      Mobility  Bed Mobility Overal bed mobility: Needs Assistance Bed Mobility: Rolling;Supine to Sit Rolling: Min assist   Supine to sit: Mod assist     General bed mobility comments: modA to assist at BLE and to elevate trunk from  [facility]. cues for each part of sequence.    Transfers Overall transfer level: Needs assistance Equipment used: Rolling walker (2 wheeled) Transfers: Sit to/from [facility] Sit to Stand: Mod assist;Min assist Stand pivot transfers: Mod assist       General transfer comment: progressed from Thomas on initial attempt to minA with cues for hand positioning and technique. cues to activate legs and push to stand as pt attempted just to rock to stand without straightening legs on first attempt when not cued. modA to manage lateral movement/positioning.  Ambulation/Gait Ambulation/Gait assistance: Mod assist Gait Distance (Feet): 8 Feet Assistive device: Rolling walker (2 wheeled) Gait Pattern/deviations: Step-to pattern;Decreased stride length;Decreased dorsiflexion - right;Decreased dorsiflexion - left;Shuffle Gait velocity: decreased   General Gait Details: small steps laterally and forward in room. cues for clearance with BLE, modA with RW management. needs frequent cues for directioning and benefits from tactile assist at times  Stairs            Wheelchair Mobility    Modified Rankin (Stroke Patients Only) Modified Rankin (Stroke Patients Only) Pre-Morbid Rankin Score: No symptoms Modified Rankin: Moderately severe disability     Balance Overall balance assessment: Needs assistance Sitting-balance support: Single extremity supported;Feet supported Sitting balance-Leahy Scale: Fair     Standing balance support: Bilateral upper extremity supported;During functional activity Standing balance-Leahy Scale: Poor                               Pertinent Vitals/Pain Pain Assessment: No/denies pain    Home  Living Family/patient expects to be discharged to:: Private residence Living Arrangements: Other relatives (brother (pt is caretaker for her brother)) Available Help at Discharge: Family;Available PRN/intermittently Type of Home: House Home  Access: Level entry     Home Layout: One level Home Equipment: None Additional Comments: pt gave her brother main bedroom with walk-in shower with grab bars. The pt's bathroom has no grab bars. 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 L neglect/inattention. LUE Sensation:  (pt reports no difference in sensation bilaterally) LUE Coordination: decreased fine motor;decreased gross motor    Lower Extremity Assessment Lower Extremity Assessment: Generalized weakness;LLE deficits/detail LLE Deficits / Details: 4/5 at ankle and 3/5 at knee extensors. multiple cues to complete movements, but then able to hold against some resistance. LLE Sensation:  (pt reports no difference in sensation bilaterally) LLE Coordination: decreased fine motor;decreased gross motor    Cervical / Trunk Assessment Cervical / Trunk Assessment: Normal  Communication   Communication: No difficulties  Cognition Arousal/Alertness: Awake/alert Behavior During Therapy: Flat affect Overall Cognitive Status: Impaired/Different from baseline Area of Impairment: Orientation;Memory;Attention;Following commands;Safety/judgement;Problem solving                 Orientation Level: Disoriented to;Place;Time Current Attention Level: Focused Memory: Decreased recall of precautions;Decreased short-term memory Following Commands: Follows one step commands  inconsistently;Follows one step commands with increased time Safety/Judgement: Decreased awareness of safety;Decreased awareness of deficits   Problem Solving: Slow processing;Decreased initiation;Difficulty sequencing;Requires verbal cues General Comments: Pt with slowed command following, at times requiring multiple repeated cues or multimodal cues to complete. unable to state needs, multiple episodes of incontinence during session, decreased insight to deficits and need for assist      General Comments General comments (skin integrity, edema, etc.): VSS on RA, pt daughter present and supportive    Exercises     Assessment/Plan    PT Assessment Patient needs continued PT services  PT Problem List Decreased strength;Decreased activity tolerance;Decreased balance;Decreased coordination;Decreased mobility;Decreased knowledge of precautions;Decreased safety awareness       PT Treatment Interventions DME instruction;Gait training;Stair training;Functional mobility training;Therapeutic activities;Therapeutic exercise;Balance training;Cognitive remediation;Patient/family education;Neuromuscular re-education    PT Goals (Current goals can be found in the Care Plan section)  Acute Rehab PT Goals Patient Stated Goal: return home PT Goal Formulation: With patient Time For Goal Achievement: 07/07/20 Potential to Achieve Goals: Good    Frequency Min 4X/week    AM-PAC PT "6 Clicks" Mobility  Outcome Measure Help needed turning from your back to your side while in a flat bed without using bedrails?: A Little Help needed moving from lying on your back to sitting on the side of a flat bed without using bedrails?: A Lot Help needed moving to and from a bed to a chair (including a wheelchair)?: A Lot Help needed standing up from a chair using your arms (e.g., wheelchair or bedside chair)?: A Little Help needed to walk in  hospital room?: A Lot Help needed climbing 3-5 steps with a railing? : A  Lot 6 Click Score: 14    End of Session Equipment Utilized During Treatment: Gait belt Activity Tolerance: Patient tolerated treatment well Patient left: in chair;with call bell/phone within reach;with chair alarm set;with nursing/sitter in room;with family/visitor present Nurse Communication: Mobility status PT Visit Diagnosis: Unsteadiness on feet (R26.81);Muscle weakness (generalized) (M62.81)    Time: 1017-5102 PT Time Calculation (min) (ACUTE ONLY): 44 min   Charges:   PT Evaluation $PT Eval Moderate Complexity: 1 Mod PT Treatments $Gait Training: 8-22 mins $Therapeutic Activity: 8-22 mins        Karma Ganja, PT, DPT   Acute Rehabilitation Department Pager #: [phone]  Otho Bellows 06/23/2020, 5:03 PM

## 2020-06-23 NOTE — Consult Note (Signed)
[facility] TeleNeurology Consult Services  Stat Consult  Date of Service:   06/23/2020 02:35:30  Diagnosis:     .  I61.8 - Other intracerebral hemorrhage  Impression: 26 F, h/o HTN and DM, who was LKW at 2 PM yesterday and was later found to be confused and not communicating properly. NIHSS 0. Her affect seems off, but there are no focal deficits noted on exam.   Head CT: Area of hemorrhage within the anteromedial right frontal lobe. Adjacent small amount of blood to the left side of the falx could be within the left frontal lobe or subarachnoid blood. No mass effect or midline shift.   PLAN  - fu CTA head/neck and CTV to eval for venous thrombosis or AVM  - strict SBP<140  - transfer to another facility with Neurosurg coverage  - MRI brain w/ and w/o contrast once stable  - neuro to follow  --  CT HEAD: Reviewed  Metrics: [facility] Notification Time: 06/23/2020 02:34:56 Stamp Time: 06/23/2020 02:35:30 Callback Response Time: 06/23/2020 02:37:44   ----------------------------------------------------------------------------------------------------  Chief Complaint: HA and confusion  History of Present Illness: Patient is a 70 year old Female.  66 F, h/o HTN, DM, who was LKW at 2 PM yesterday. 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 Her daughter was there with her. Pt had complained of a headache. Then at 6:30 PM her daughter called her house and realized that pt was not home. 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 Daughter eventually found pt still at the funeral home not acting right, and not communicating properly so pt was brought to ER.   Examination: 1A: Level of Consciousness - Alert; keenly responsive + 0 1B: Ask Month and Age - Both Questions Right + 0 1C: Blink Eyes & Squeeze Hands - Performs Both Tasks + 0 2: Test Horizontal Extraocular Movements - Normal + 0 3: Test  Visual Fields - No Visual Loss + 0 4: Test Facial Palsy (Use Grimace if Obtunded) - Normal symmetry + 0 5A: Test Left Arm Motor Drift - No Drift for 10 Seconds + 0 5B: Test Right Arm Motor Drift - No Drift for 10 Seconds + 0 6A: Test Left Leg Motor Drift - No Drift for 5 Seconds + 0 6B: Test Right Leg Motor Drift - No Drift for 5 Seconds + 0 7: Test Limb Ataxia (FNF/Heel-Shin) - No Ataxia + 0 8: Test Sensation - Normal; No sensory loss + 0 9: Test Language/Aphasia - Normal; No aphasia + 0 10: Test Dysarthria - Normal + 0 11: Test Extinction/Inattention - No abnormality + 0  NIHSS Score: 0   Patient / Family was informed the Neurology Consult would occur via TeleHealth consult by way of interactive audio and video telecommunications and consented to receiving care in this manner.  Patient is being evaluated for possible acute neurologic impairment and high probability of imminent or life - threatening deterioration.I spent total of 15 minutes providing care to this patient, including time for face to face visit via telemedicine, review of medical records, imaging studies and discussion of findings with providers, the patient and / or family.   Dr Burtis Junes   [facility] [phone]  Case [id number]

## 2020-06-23 NOTE — ED Notes (Signed)
In CT

## 2020-06-23 NOTE — ED Notes (Signed)
CT called, reports they will see patient next. Pt and daughter made aware. Denies needs at this time.

## 2020-06-23 NOTE — H&P (Addendum)
NEUROLOGY CONSULTATION NOTE   Date of service: June 23, 2020 Patient Name: Cheryl Ortiz MRN:  [id number] DOB:  April 11, 1951 Reason for consult: "ICH" _ _ _   _ __   _ __ _ _  __ __   _ __   __ _  History of Present Illness  Cheryl Ortiz is a 70 y.o. 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 Found to have a R frontal lobe ICH.  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 She reports that family were worried that she did not seem right. Endorses headache 7/10 Bi frontal for the last couple days. Nothing out of the ordinary otherwise. No trauma or fall, takes aspirin 81mg  daily, no over the counter NSAIDs, does not smoke, no rereactional substances. No EtOH use.  NIHSS: NIHSS components Score: Comment  1a Level of Conscious 0[x]  1[]  2[]  3[]      1b LOC Questions 0[x]  1[]  2[]       1c LOC Commands 0[x]  1[]  2[]       2 Best Gaze 0[x]  1[]  2[]       3 Visual 0[x]  1[]  2[]  3[]      4 Facial Palsy 0[x]  1[]  2[]  3[]      5a Motor Arm - left 0[x]  1[]  2[]  3[]  4[]  UN[]    5b Motor Arm - Right 0[x]  1[]  2[]  3[]  4[]  UN[]    6a Motor Leg - Left 0[]  1[x]  2[]  3[]  4[]  UN[]    6b Motor Leg - Right 0[x]  1[]  2[]  3[]  4[]  UN[]    7 Limb Ataxia 0[x]  1[]  2[]  3[]  UN[]     8 Sensory 0[x]  1[]  2[]  UN[]      9 Best Language 0[x]  1[]  2[]  3[]      10 Dysarthria 0[x]  1[]  2[]  UN[]      11 Extinct. and Inattention 0[x]  1[]  2[]       TOTAL: 1   MRS: 0 tPA or Thrombectomy: No, she has ICH ICH score of 1.      ROS   Constitutional Denies weight loss, fever and chills.  HEENT Denies changes in vision and hearing.  Respiratory Denies SOB and cough.   CV Denies palpitations and CP   GI Denies abdominal pain, nausea, vomiting and diarrhea.   GU Denies dysuria and urinary frequency.   MSK Denies myalgia and joint pain.   Skin Denies rash and pruritus.   Neurological  Denies headache and syncope.   Psychiatric Denies recent changes in mood. Denies anxiety and depression.    Past History   Past Medical History:  Diagnosis Date  . Aneurysm ([place]) 06/2015   brain-small will recheck in 1 year  . Cold   . Cystocele 07/12/2015  . Hyperlipidemia   . Hypertension   . Neuropathy of both feet    tingling/numbness bilateral feet ,rt arm,hands-nerve study scheduled  . Osteoarthritis   . Renal disorder    kidney stones  . Seasonal allergies   . Type 2 diabetes mellitus ([place])    Past Surgical History:  Procedure Laterality Date  . CHOLECYSTECTOMY  1999  . COLONOSCOPY N/A 06/15/2014   Procedure: COLONOSCOPY;  Surgeon: Rogene Houston, MD;  [facility];  Service: Endoscopy;  Laterality: N/A;  1145  . CYSTOSCOPY WITH RETROGRADE PYELOGRAM, URETEROSCOPY AND STENT PLACEMENT Bilateral 08/20/2015   Procedure: CYSTOSCOPY WITH RETROGRADE PYELOGRAM, URETEROSCOPY AND STENT PLACEMENT WITH  STONE EXTRACTION WITH BASKET;  Surgeon: Cleon Gustin, MD;  Location: [facility];  Service: Urology;  Laterality: Bilateral;  . ESOPHAGEAL DILATION N/A 03/23/2015   Procedure: ESOPHAGEAL DILATION;  Surgeon: Rogene Houston, MD;  [facility]: [facility];  Service: Endoscopy;  Laterality: N/A;  . ESOPHAGOGASTRODUODENOSCOPY N/A 03/23/2015   Procedure: ESOPHAGOGASTRODUODENOSCOPY (EGD);  Surgeon: Rogene Houston, MD;  [facility];  Service: Endoscopy;  Laterality: N/A;  8:55 - moved to 12:35 - Cheryl Ortiz to notify pt  . FOOT SURGERY Bilateral   . HOLMIUM LASER APPLICATION Bilateral 12/18/4479   Procedure: HOLMIUM LASER APPLICATION;  Surgeon: Cleon Gustin, MD;  Location: [facility];  Service: Urology;  Laterality: Bilateral;  . VAGINAL HYSTERECTOMY  1984   Family History  Problem Relation Age of Onset  . Hypertension Mother   . Arthritis Mother   . Cancer Mother        breast   . Cerebral palsy Brother   . Early death Brother   .  Hypertension Brother   . Heart disease Brother   . Pneumonia Father   . Hypertension Father   . Hyperlipidemia Sister   . Breast cancer Sister   . Fibroids Sister   . Hypertension Brother   . Heart attack Brother    Social History   Socioeconomic History  . Marital status: Widowed    Spouse name: Not on file  . Number of children: Not on file  . Years of education: Not on file  . Highest education level: Not on file  Occupational History  . Not on file  Tobacco Use  . Smoking status: Former Smoker    Packs/day: 0.50    Years: 12.00    Pack years: 6.00    Types: Cigarettes    Quit date: 08/15/1997    Years since quitting: 22.8  . Smokeless tobacco: Never Used  Vaping Use  . Vaping Use: Never used  Substance and Sexual Activity  . Alcohol use: Not Currently  . Drug use: No  . Sexual activity: Not Currently    Birth control/protection: Surgical    Comment: hyst  Other Topics Concern  . Not on file  Social History Narrative  . Not on file   Social Determinants of Health   Financial Resource Strain: Not on file  Food Insecurity: Not on file  Transportation Needs: Not on file  Physical Activity: Not on file  Stress: Not on file  Social Connections: Not on file   Allergies  Allergen Reactions  . Codeine Other (See Comments)    Sensitivity, "makes high"  . Prednisone Other (See Comments)    High blood sugar.    Medications   Medications Prior to Admission  Medication Sig Dispense Refill Last Dose  . Albuterol (PROVENTIL IN) Inhale 2 Inhalers into the lungs daily as needed (shortness of breath).   06/21/2020 at Unknown time  . aspirin 81 MG tablet Take 81 mg by mouth daily.   06/22/2020 at Unknown time  . Aspirin-Acetaminophen-Caffeine (EXCEDRIN EXTRA STRENGTH PO) Take 2 tablets by mouth daily as needed (headache).   06/22/2020 at Unknown time  . hydrochlorothiazide (HYDRODIURIL) 25 MG tablet Take 25 mg by mouth daily.  2 06/22/2020 at Unknown time  . metFORMIN  (GLUCOPHAGE) 500 MG tablet Take 1,000 mg by mouth daily with breakfast.    06/22/2020 at Unknown time  . PENNSAID 2 % SOLN Apply 2 Pump topically in the morning and at bedtime.  Past Week at Unknown time  . albuterol (VENTOLIN HFA) 108 (90 Base) MCG/ACT inhaler 2 puffs as needed     . LORazepam (ATIVAN) 1 MG tablet Take 1 tablet (1 mg total) by mouth 3 (three) times daily as needed for anxiety. (Patient not taking: No sig reported) 15 tablet 0 Not Taking at Unknown time  . UNABLE TO FIND Herbal supplement-daily (Patient not taking: No sig reported)   Not Taking at Unknown time  . VITAMIN D PO Take 50,000 mg by mouth once a week. (Patient not taking: No sig reported)   Not Taking at Unknown time     Vitals   Vitals:   06/23/20 0100 06/23/20 0115 06/23/20 0130 06/23/20 0330  BP: 128/66 127/76 128/75 (!) 116/54  Pulse: 69 64 68 68  Resp: 17 11 (!) 22 (!) 21  Temp:    98.7 F (37.1 C)  TempSrc:    Oral  SpO2: 100% 100% 97% 100%  Weight:      Height:         Body mass index is 36.48 kg/m.  Physical Exam   General: Laying comfortably in bed; in no acute distress.  HENT: Normal oropharynx and mucosa. Normal external appearance of ears and nose.  Neck: Supple, no pain or tenderness  CV: No JVD. No peripheral edema.  Pulmonary: Symmetric Chest rise. Normal respiratory effort.  Abdomen: Soft to touch, non-tender.  Ext: No cyanosis, edema, or deformity  Skin: No rash. Normal palpation of skin.   Musculoskeletal: Normal digits and nails by inspection. No clubbing.   Neurologic Examination  Mental status/Cognition: Alert, oriented to self, place, month and year, good attention. Flat affect. Speech/language: Fluent, comprehension intact, object naming intact, repetition intact. Cranial nerves:   CN II Pupils equal and reactive to light, no VF deficits   CN III,IV,VI EOM intact, no gaze preference or deviation, no nystagmus   CN V normal sensation in V1, V2, and V3 segments bilaterally    CN VII no asymmetry, no nasolabial fold flattening   CN VIII normal hearing to speech   CN IX & X normal palatal elevation, no uvular deviation   CN XI 5/5 head turn and 5/5 shoulder shrug bilaterally   CN XII midline tongue protrusion    Motor:  Muscle bulk: normal, tone normal, pronator drift LUE pronator drift. tremor None Mvmt Root Nerve  Muscle Right Left Comments  SA C5/6 Ax Deltoid 5 5   EF C5/6 Mc Biceps 5 5   EE C6/7/8 Rad Triceps 5 4+   WF C6/7 Med FCR 5 5   WE C7/8 PIN ECU 5 5   F Ab C8/T1 U ADM/FDI 5 5   HF L1/2/3 Fem Illopsoas 5 4+   KE L2/3/4 Fem Quad 5 4+   DF L4/5 D Peron Tib Ant 5 5   PF S1/2 Tibial Grc/Sol 5 5    Reflexes:  Right Left Comments  Pectoralis      Biceps (C5/6) 2 2   Brachioradialis (C5/6) 2 2    Triceps (C6/7) 2 2    Patellar (L3/4) 2 2    Achilles (S1)      Hoffman      Plantar     Jaw jerk    Sensation:  Light touch Intact throughout.   Pin prick    Temperature    Vibration   Proprioception    Coordination/Complex Motor:  - Finger to Nose intact BL - Heel to shin intact BL - Rapid  alternating movement are slowed mildly in LUE - Gait: deferred.  Labs   CBC:  Recent Labs  Lab 06/22/20 1918  WBC 9.9  NEUTROABS 7.9*  HGB 14.0  HCT 44.6  MCV 92.3  PLT 409    Basic Metabolic Panel:  Lab Results  Component Value Date   NA 137 06/22/2020   K 2.9 (L) 06/22/2020   CO2 25 06/22/2020   GLUCOSE 160 (H) 06/22/2020   BUN 21 06/22/2020   CREATININE 1.12 (H) 06/22/2020   CALCIUM 9.9 06/22/2020   GFRNONAA 54 (L) 06/22/2020   GFRAA >60 12/18/2019   Lipid Panel: No results found for: LDLCALC HgbA1c: No results found for: HGBA1C Urine Drug Screen:     Component Value Date/Time   LABOPIA NONE DETECTED 09/27/2019 1209   COCAINSCRNUR NONE DETECTED 09/27/2019 1209   LABBENZ NONE DETECTED 09/27/2019 1209   AMPHETMU NONE DETECTED 09/27/2019 1209   THCU NONE DETECTED 09/27/2019 1209   LABBARB NONE DETECTED 09/27/2019 1209     Alcohol Level     Component Value Date/Time   ETH <10 09/27/2019 1108    CT Head without contrast: R Frotnal ICH  CT angio Head and Neck with contrast: No LVO, no spot sign.  CT Venogram: - No sinus venous thrombosis.  MRI Brain pending.  Impression   HEAVEN MEEKER is a 70 y.o. female with PMH significant for HTN, DM2, 56mm aneurysm vs infundibulum of R MCA M1 who presents with non traumatic R frontal ICH that is stable in size on repeat imaging. Her neurologic examination is notable for mild LUE and LLE weakness with flat affect, likely due to the location of the [place] in the R frontal lobe. She has a7/10 headache right now.  Recommendations   Admit to ICU - Stability scan in 6 hours or STAT with any neurological decline - Frequent neuro checks; q49min for 1 hour, then q1hour - No antiplatelets or anticoagulants due to [place] - SCD for DVT prophylaxis, can do pharmacological dvt ppx after 24 hours if stable - Blood pressure control with goal systolic 811 - 914, cleverplex and labetalol PRN - Stroke labs, HgbA1c, fasting lipid panel - MRI brain with and without contrast when stabilized to evaluate for underlying mass - Risk factor modification - Echocardiogram - PT consult, OT consult, Speech consult when patient stabilized  - Stroke team to follow - Hypertonic Saline 3% at 75cc/hr. Goal Sodium 145-150   DM2: - Carb controlled diet. - ACHS - SSI with carb correction   HTN:  PRN Labetalol for SBP > 140. _______________________________________________________________  This patient is critically ill and at significant risk of neurological worsening, death and care requires constant monitoring of vital signs, hemodynamics,respiratory and cardiac monitoring, neurological assessment, discussion with family, other specialists and medical decision making of high complexity. I spent 40 minutes of neurocritical care time  in the care of  this patient. This was time spent  independent of any time provided by nurse practitioner or PA.  Donnetta Simpers [facility] Pager Number [phone] 06/23/2020  6:18 AM   Thank you for the opportunity to take part in the care of this patient. If you have any further questions, please contact the neurology consultation attending.  Signed,  [place] Pager Number [phone] _ _ _   _ __   _ __ _ _  __ __   _ __   __ _

## 2020-06-23 NOTE — Progress Notes (Signed)
Updated Dr. Lorrin Goodell that patient passed [place] swallow screen. New verbal orders received for carb modified diet.

## 2020-06-24 ENCOUNTER — Inpatient Hospital Stay (HOSPITAL_COMMUNITY): Payer: Medicare Other

## 2020-06-24 DIAGNOSIS — I6389 Other cerebral infarction: Secondary | ICD-10-CM | POA: Diagnosis not present

## 2020-06-24 DIAGNOSIS — I10 Essential (primary) hypertension: Secondary | ICD-10-CM | POA: Diagnosis not present

## 2020-06-24 LAB — GLUCOSE, CAPILLARY
Glucose-Capillary: 152 mg/dL — ABNORMAL HIGH (ref 70–99)
Glucose-Capillary: 102 mg/dL — ABNORMAL HIGH (ref 70–99)
Glucose-Capillary: 112 mg/dL — ABNORMAL HIGH (ref 70–99)
Glucose-Capillary: 104 mg/dL — ABNORMAL HIGH (ref 70–99)

## 2020-06-24 LAB — CBC
HCT: 37.3 % (ref 36.0–46.0)
Hemoglobin: 12.3 g/dL (ref 12.0–15.0)
MCH: 29.6 pg (ref 26.0–34.0)
MCHC: 33 g/dL (ref 30.0–36.0)
MCV: 89.9 fL (ref 80.0–100.0)
Platelets: 274 10*3/uL (ref 150–400)
RBC: 4.15 MIL/uL (ref 3.87–5.11)
RDW: 13.6 % (ref 11.5–15.5)
WBC: 6.8 10*3/uL (ref 4.0–10.5)
nRBC: 0 % (ref 0.0–0.2)

## 2020-06-24 LAB — BASIC METABOLIC PANEL
Anion gap: 10 (ref 5–15)
BUN: 16 mg/dL (ref 8–23)
CO2: 23 mmol/L (ref 22–32)
Calcium: 9.2 mg/dL (ref 8.9–10.3)
Chloride: 106 mmol/L (ref 98–111)
Creatinine, Ser: 0.96 mg/dL (ref 0.44–1.00)
GFR, Estimated: >60 mL/min (ref >60)
Glucose, Bld: 131 mg/dL — ABNORMAL HIGH (ref 70–99)
Potassium: 3.6 mmol/L (ref 3.5–5.1)
Sodium: 139 mmol/L (ref 135–145)

## 2020-06-24 LAB — ECHOCARDIOGRAM COMPLETE
AR max vel: 2.21 cm2
AV Area VTI: 2.45 cm2
AV Area mean vel: 1.94 cm2
AV Mean grad: 4 mmHg
AV Peak grad: 6.9 mmHg
Ao pk vel: 1.31 m/s
Area-P 1/2: 2.69 cm2
Height: 66 in
MV VTI: 2.76 cm2
S' Lateral: 2.7 cm
Weight: 3616 oz

## 2020-06-24 MED ORDER — POTASSIUM CHLORIDE CRYS ER 20 MEQ PO TBCR
40.0000 meq | EXTENDED_RELEASE_TABLET | Freq: Once | ORAL | Status: AC
  Administered 2020-06-24: 40 meq via ORAL

## 2020-06-24 NOTE — Progress Notes (Signed)
  Echocardiogram 2D Echocardiogram has been performed.  Cheryl Ortiz 06/24/2020, 3:35 PM

## 2020-06-24 NOTE — Evaluation (Signed)
Speech Language Pathology Evaluation Patient Details Name: Cheryl Ortiz MRN: [id number] DOB: 11/11/1950 Today's Date: 06/24/2020 Time: 8921-1941 SLP Time Calculation (min) (ACUTE ONLY): 30 min  Problem List:  Patient Active Problem List   Diagnosis Date Noted  . ICH (intracerebral hemorrhage) ([place]) 06/23/2020  . Pelvic floor relaxation 03/07/2020  . Screening for colorectal cancer 12/09/2018  . 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 Snoring 06/08/2018  . Poor sleep hygiene 06/08/2018  . Chronic insomnia 06/08/2018  . Encounter for well woman exam with routine gynecological exam 09/09/2017  . Female cystocele 09/09/2017  . Cystocele 07/12/2015  . Precordial pain 02/18/2013  . Type 2 diabetes mellitus ([place]) 02/18/2013  . Hyperlipidemia 02/17/2013  . Essential hypertension, benign 02/17/2013   Past Medical History:  Past Medical History:  Diagnosis Date  . Aneurysm ([place]) 06/2015   brain-small will recheck in 1 year  . Cold   . Cystocele 07/12/2015  . Hyperlipidemia   . Hypertension   . Neuropathy of both feet    tingling/numbness bilateral feet ,rt arm,hands-nerve study scheduled  . Osteoarthritis   . Renal disorder    kidney stones  . Seasonal allergies   . Type 2 diabetes mellitus ([place])    Past Surgical History:  Past Surgical History:  Procedure Laterality Date  . CHOLECYSTECTOMY  1999  . COLONOSCOPY N/A 06/15/2014   Procedure: COLONOSCOPY;  Surgeon: Rogene Houston, MD;  [facility];  Service: Endoscopy;  Laterality: N/A;  1145  . CYSTOSCOPY WITH RETROGRADE PYELOGRAM, URETEROSCOPY AND STENT PLACEMENT Bilateral 08/20/2015   Procedure: CYSTOSCOPY WITH RETROGRADE PYELOGRAM, URETEROSCOPY AND STENT PLACEMENT WITH STONE EXTRACTION WITH BASKET;  Surgeon: Cleon Gustin, MD;  Location: [facility];  Service: Urology;  Laterality: Bilateral;  . ESOPHAGEAL DILATION N/A 03/23/2015   Procedure: ESOPHAGEAL DILATION;  Surgeon:  Rogene Houston, MD;  [facility]: [facility];  Service: Endoscopy;  Laterality: N/A;  . ESOPHAGOGASTRODUODENOSCOPY N/A 03/23/2015   Procedure: ESOPHAGOGASTRODUODENOSCOPY (EGD);  Surgeon: Rogene Houston, MD;  [facility];  Service: Endoscopy;  Laterality: N/A;  8:55 - moved to 12:35 - Ann to notify pt  . FOOT SURGERY Bilateral   . HOLMIUM LASER APPLICATION Bilateral 11/19/812   Procedure: HOLMIUM LASER APPLICATION;  Surgeon: Cleon Gustin, MD;  Location[facility];  Service: Urology;  Laterality: Bilateral;  . VAGINAL HYSTERECTOMY  1984   HPI:  [name] is a 70 y.o. 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  MRI 2/5: "Right frontal (and smaller left frontal) intraparenchymal  hemorrhages, likely similar in size when comparing across modalities. Small volume of adjacent hemorrhage along the falx and suspected trace right frontal subarachnoid hemorrhage. No surrounding restricted diffusion to suggest hemorrhagic transformation of infarct. No visible mass lesion, although acute blood products limit evaluation. A follow-up MRI after resolution of hemorrhage could further evaluate for underlying abnormality."   Assessment / Plan / Recommendation Clinical Impression  Pt presents with mild cognitive deficits in setting of recent frontal ICH.  Pt was assessed using the COGNISTAT (see below for additional information).  The only subtest that was outside of the normal range was memory and pt reports memory deficits prior to hospitalization.  Pt also demonstrated mild L neglect during evaluation.  She benefited from visual cuing to attend to L side of page for pictures description, but was able to track to left side of tray table to locate objects during command following task independently.  Despite the fact that pt performed within average range, pt appears to have some cognitive deficits that weren't picked up by this assessment and may benefit from a more targeted assessment of R brain deficits at next level of care.  RN reports pt pouring water without stopping when cup was filled to overflowing.  Pt does not demonstrate awareness of changes and denies all deficits.     Pt's expressive language is largely intact, but marked by moderate pragmatic deficits.  Pt presents with flat affect, low vocal intensity, poor conversational turn taking ability (seemingly logorrheic), monotone, and prosodic deficits with poor sentence pacing, delivering speech without pauses between phrases/sentences. Pt also seems generally to have some stress and anxiety and spoke at length about her caregiving obligations for her family and concern about the level of care at her mother's residential facility.    Speech is clear and free from dysarthria; however, SLP had to request repetition on several occasions 2/2 monotone, low vocal intensity, and prosodic impairments.   Pt would benefit from SLP intervention to address the above deficits and at the next level of care.  Pt may be a good candidate for in patient rehab given her level of impairment and her ability to participate in therapy.  COGNISTAT: All subtests are within the average range, except where otherwise specified.  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    Follow Up Recommendations   Inpatient Rehab    Frequency and Duration min 2x/week  2 weeks      SLP Evaluation Cognition  Overall Cognitive Status: Impaired/Different from baseline Arousal/Alertness: Awake/alert Orientation Level: Oriented X4 Attention: Focused;Sustained Focused Attention: Appears intact Sustained Attention: Appears intact Memory: Impaired Memory Impairment: Decreased short term memory Awareness: Impaired Awareness Impairment: Intellectual impairment       Comprehension  Auditory Comprehension Overall Auditory Comprehension: Appears within functional limits for tasks assessed Commands: Within Functional Limits    Expression Expression Primary Mode of Expression: Verbal Verbal Expression Overall Verbal Expression: Impaired Naming: No impairment Pragmatics: Impairment Impairments: Abnormal affect;Eye contact;Monotone;Interpretation of nonverbal communication;Topic maintenance   Oral / Motor  Motor Speech Overall Motor Speech: Appears within functional limits for tasks assessed Respiration: Within functional limits Phonation: Normal Resonance: Within functional limits Articulation: Within functional limitis Intelligibility: Intelligible Motor Planning: Witnin functional limits Motor Speech Errors: Not applicable   [place], [place], [place] Office: [phone] 06/24/2020, 10:15 AM

## 2020-06-24 NOTE — Consult Note (Incomplete)
Physical Medicine and Rehabilitation Consult Reason for Consult: Left side weakness Referring Physician: Dr.Xu   HPI: Cheryl Ortiz is a 70 y.o. 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  Per chart review independent prior to admission.  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  1 level home with level entry.  Presented to 10/05/2020 with acute onset of left-sided weakness.  Cranial CT scan showed an area of hemorrhage within the anterior medial right frontal lobe.  Adjacent small amount of blood to the left side of the falx could be within the left frontal lobe or subarachnoid blood.  No mass-effect or midline shift.  CT angiogram of head and neck no sign of high flow vascular malformation in the region of the right frontal hemorrhage.  No sign of venous thrombosis.  MRI of the brain right frontal and smaller left frontal intraparenchymal hemorrhages likely smaller in size when comparing across modalities.  Small volume of adjacent hemorrhage along the falx and suspected trace right frontal subarachnoid hemorrhage.  No surrounding restricted diffusion to suggest hemorrhagic transformation of infarct.  No visible mass lesion.  Echocardiogram with ejection fraction of 60 to 65% no wall motion abnormality grade 2 diastolic dysfunction..  Neurology follow-up with work-up currently ongoing.  Tolerating a regular consistency diet.  Therapy evaluations completed recommendations of physical medicine rehab consult due to left-sided weakness.   Review of Systems  Constitutional: Negative for fever.  HENT: Negative for hearing loss.   Eyes: Negative for blurred vision and double vision.  Respiratory: Negative for cough and shortness of breath.   Cardiovascular:  Negative for chest pain, palpitations and leg swelling.  Gastrointestinal: Positive for constipation. Negative for heartburn and nausea.  Genitourinary: Negative for dysuria, flank pain and hematuria.  Musculoskeletal: Positive for joint pain and myalgias.  Skin: Negative for rash.  Neurological: Positive for sensory change, weakness and headaches.  All other systems reviewed and are negative.  Past Medical History:  Diagnosis Date  . Aneurysm ([place]) 06/2015   brain-small will recheck in 1 year  . Cold   . Cystocele 07/12/2015  . Hyperlipidemia   . Hypertension   . Neuropathy of both feet    tingling/numbness bilateral feet ,rt arm,hands-nerve study scheduled  . Osteoarthritis   . Renal disorder    kidney stones  . Seasonal allergies   . Type 2 diabetes mellitus ([place])    Past Surgical History:  Procedure Laterality Date  . CHOLECYSTECTOMY  1999  . COLONOSCOPY N/A 06/15/2014   Procedure: COLONOSCOPY;  Surgeon: Rogene Houston, MD;  Location: [facility];  Service: Endoscopy;  Laterality: N/A;  1145  . CYSTOSCOPY WITH RETROGRADE PYELOGRAM, URETEROSCOPY AND STENT PLACEMENT Bilateral 08/20/2015   Procedure: CYSTOSCOPY WITH RETROGRADE PYELOGRAM, URETEROSCOPY AND STENT PLACEMENT WITH STONE EXTRACTION WITH BASKET;  Surgeon: Cleon Gustin, MD;  Location: [facility];  Service: Urology;  Laterality: Bilateral;  . ESOPHAGEAL DILATION N/A 03/23/2015   Procedure: ESOPHAGEAL DILATION;  Surgeon: Rogene Houston, MD;  Location: [facility];  Service: Endoscopy;  Laterality: N/A;  . ESOPHAGOGASTRODUODENOSCOPY N/A 03/23/2015   Procedure: ESOPHAGOGASTRODUODENOSCOPY (EGD);  Surgeon: Rogene Houston, MD;  Location: [facility];  Service: Endoscopy;  Laterality: N/A;  8:55 - moved to 12:35 - Lelon Frohlich  to notify pt  . FOOT SURGERY Bilateral   . HOLMIUM LASER APPLICATION Bilateral [id number]   Procedure: HOLMIUM LASER APPLICATION;  Surgeon: Cleon Gustin, MD;  Location[facility];  Service: Urology;  Laterality: Bilateral;  . VAGINAL HYSTERECTOMY  1984   Family History  Problem Relation Age of Onset  . Hypertension Mother   . Arthritis Mother   . Cancer Mother        breast   . Cerebral palsy Brother   . Early death Brother   . Hypertension Brother   . Heart disease Brother   . Pneumonia Father   . Hypertension Father   . Hyperlipidemia Sister   . Breast cancer Sister   . Fibroids Sister   . Hypertension Brother   . Heart attack Brother    Social History:  reports that she quit smoking about 22 years ago. Her smoking use included cigarettes. She has a 6.00 pack-year smoking history. She has never used smokeless tobacco. She reports previous alcohol use. She reports that she does not use drugs. Allergies:  Allergies  Allergen Reactions  . Codeine Other (See Comments)    Sensitivity, "makes high"  . Prednisone Other (See Comments)    High blood sugar.   Medications Prior to Admission  Medication Sig Dispense Refill  . Albuterol (PROVENTIL IN) Inhale 2 Inhalers into the lungs daily as needed (shortness of breath).    Marland Kitchen aspirin 81 MG tablet Take 81 mg by mouth daily.    . Aspirin-Acetaminophen-Caffeine (EXCEDRIN EXTRA STRENGTH PO) Take 2 tablets by mouth daily as needed (headache).    . hydrochlorothiazide (HYDRODIURIL) 25 MG tablet Take 25 mg by mouth daily.  2  . metFORMIN (GLUCOPHAGE) 500 MG tablet Take 1,000 mg by mouth daily with breakfast.     . PENNSAID 2 % SOLN Apply 2 Pump topically in the morning and at bedtime.    Marland Kitchen albuterol (VENTOLIN HFA) 108 (90 Base) MCG/ACT inhaler 2 puffs as needed    . LORazepam (ATIVAN) 1 MG tablet Take 1 tablet (1 mg total) by mouth 3 (three) times daily as needed for anxiety. (Patient not taking: No sig reported) 15 tablet 0  . UNABLE TO FIND Herbal supplement-daily (Patient not taking: No sig reported)    . VITAMIN D PO Take 50,000 mg by mouth once a week. (Patient not taking: No sig reported)       Home: Home Living Family/patient expects to be discharged to:: Private residence Living Arrangements: Other relatives Available Help at Discharge: Family,Available PRN/intermittently Type of Home: House Home Access: Level entry Home Layout: One level Bathroom Shower/Tub: Chiropodist: Standard Home Equipment: None Additional Comments: Pt's brother, who is blind, lives with her.  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  Pt distracted and unable to sequence task requirng assist to lift trunk . Transfers Overall transfer level: Needs assistance Equipment used: 1 person hand held assist Transfers: Stand Pivot Transfers,Sit to/from Stand Sit to Stand: Mod assist Stand pivot transfers: Mod assist General transfer comment: requires assist to initiate activity.  Once assist provided to initiate activity, she was able  to turn toward the chair, but became distracted and unable to re initiate the task without physical assistance.  Pt with poorly controlled descent into the chair Ambulation/Gait Ambulation/Gait assistance: Mod assist Gait Distance (Feet): 8 Feet Assistive device: Rolling walker (2 wheeled) Gait Pattern/deviations: Step-to pattern,Decreased stride length,Decreased dorsiflexion - right,Decreased dorsiflexion - left,Shuffle General Gait Details: small steps laterally and forward in room. cues for clearance with BLE, modA with RW management. needs frequent cues for directioning and benefits from tactile assist at times Gait velocity: decreased     ADL: ADL Overall ADL's : Needs assistance/impaired Eating/Feeding: Moderate assistance,Sitting Grooming: Wash/dry hands,Wash/dry face,Oral care,Moderate assistance,Sitting Grooming Details (indicate cue type and reason): Pt self distracts and requires assist to complete tasks. Upper Body Bathing: Sitting,Maximal assistance Lower Body Bathing: Maximal assistance,Sit to/from stand Upper Body Dressing : Maximal assistance,Sitting Lower Body Dressing: Sit to/from stand,Maximal assistance Lower Body Dressing Details (indicate cue type and reason): Pt was instructed to take of her socks.  She required the request to be repeated 4 times before she could initiate it. She eventually pulled sock half way off, then became distracted by the need to scratch her foot, and was unable to complete the task. Toilet Transfer: Moderate assistance,Stand-pivot,BSC Toilet Transfer Details (indicate cue type and reason): verbal cues and mod A to fully turn to the chair before attempting to sit Toileting- Clothing Manipulation and Hygiene: Maximal assistance,Sit to/from stand Functional mobility during ADLs: Moderate assistance  Cognition: Cognition Overall Cognitive Status: Impaired/Different from baseline Arousal/Alertness: Awake/alert Orientation Level: Oriented X4 Attention: Focused,Sustained Focused Attention: Appears intact Sustained Attention: Appears intact Memory: Impaired Memory Impairment: Decreased short term memory Awareness: Impaired Awareness Impairment: Intellectual impairment Cognition Arousal/Alertness: Awake/alert Behavior During Therapy: Flat affect Overall Cognitive Status: Impaired/Different from baseline Area of Impairment: Attention,Memory,Following commands,Safety/judgement,Awareness,Problem solving Orientation Level: Disoriented to,Place,Time Current Attention Level: Sustained (with up to max cues) Memory: Decreased short-term memory Following Commands: Follows one step  commands inconsistently,Follows one step commands with increased time Safety/Judgement: Decreased awareness of deficits,Decreased awareness of safety Awareness: Intellectual Problem Solving: Slow processing,Decreased initiation,Difficulty sequencing,Requires verbal cues,Requires tactile cues General Comments: Pt noted with motor perserveration, and is very distracted.  Upon myentrance on pt's Lt, pt rubbing a water bottle label with her Rt hand and looking to the Rt.  Pt verbally greeted me.  she answered all questions, but did not attempt to turn head and look to to me on her left.  When asked what color hair I had, she made up a color, but did not initiate lookig to me on her lt.  When cued to look at me, she still was unable to due to being distracted by rubbing the label which was on her Rt.  She eventually did turn to her left, once I moved to her right and instructed her to follow me with her eyes.  She required >4 mins to initiate moving Lt LE off the bed to move to the EOB.  She moved her Rt LE off the bed, but made no attempt to move her Lt LE despite max cues and prompting.  She eventually required max A to initiate this.  Pt attempted to change the channels of her TV, but instead decreased the volume perseverating on pushing the button.  She was unable to recognize her error, or correct her error without max prompting and assist from me.  Blood pressure 115/62, pulse 75, temperature 97.6 F (36.4 C), temperature source Oral, resp. rate 15, height 5\' 6"  (1.676 m), weight 102.5 kg,  SpO2 95 %. Physical Exam Neurological:     Comments: Patient is awake alert no acute distress.  Right gaze preference.  Provides name and age.  Follows simple commands.     Results for orders placed or performed during the hospital encounter of 06/22/20 (from the past 24 hour(s))  Glucose, capillary     Status: Abnormal   Collection Time: 06/24/20  8:43 AM  Result Value Ref Range   Glucose-Capillary 152 (H) 70 -  99 mg/dL  Glucose, capillary     Status: Abnormal   Collection Time: 06/24/20 12:02 PM  Result Value Ref Range   Glucose-Capillary 102 (H) 70 - 99 mg/dL  Glucose, capillary     Status: Abnormal   Collection Time: 06/24/20  5:41 PM  Result Value Ref Range   Glucose-Capillary 112 (H) 70 - 99 mg/dL  Glucose, capillary     Status: Abnormal   Collection Time: 06/24/20  7:35 PM  Result Value Ref Range   Glucose-Capillary 104 (H) 70 - 99 mg/dL  Glucose, capillary     Status: None   Collection Time: 06/25/20 12:19 AM  Result Value Ref Range   Glucose-Capillary 98 70 - 99 mg/dL  Basic metabolic panel     Status: Abnormal   Collection Time: 06/25/20  1:14 AM  Result Value Ref Range   Sodium 137 135 - 145 mmol/L   Potassium 3.3 (L) 3.5 - 5.1 mmol/L   Chloride 104 98 - 111 mmol/L   CO2 23 22 - 32 mmol/L   Glucose, Bld 116 (H) 70 - 99 mg/dL   BUN 15 8 - 23 mg/dL   Creatinine, Ser 0.95 0.44 - 1.00 mg/dL   Calcium 9.2 8.9 - 10.3 mg/dL   GFR, Estimated >60 >60 mL/min   Anion gap 10 5 - 15  CBC     Status: None   Collection Time: 06/25/20  1:14 AM  Result Value Ref Range   WBC 8.1 4.0 - 10.5 K/uL   RBC 4.14 3.87 - 5.11 MIL/uL   Hemoglobin 12.3 12.0 - 15.0 g/dL   HCT 36.8 36.0 - 46.0 %   MCV 88.9 80.0 - 100.0 fL   MCH 29.7 26.0 - 34.0 pg   MCHC 33.4 30.0 - 36.0 g/dL   RDW 13.5 11.5 - 15.5 %   Platelets 273 150 - 400 K/uL   nRBC 0.0 0.0 - 0.2 %  Glucose, capillary     Status: None   Collection Time: 06/25/20  4:55 AM  Result Value Ref Range   Glucose-Capillary 99 70 - 99 mg/dL   MR BRAIN W WO CONTRAST  Result Date: 06/23/2020 CLINICAL DATA:  Intracranial hemorrhage. EXAM: MRI HEAD WITHOUT AND WITH CONTRAST TECHNIQUE: Multiplanar, multiecho pulse sequences of the brain and surrounding structures were obtained without and with intravenous contrast. CONTRAST:  66mL GADAVIST GADOBUTROL 1 MMOL/ML IV SOLN COMPARISON:  CT imaging from the same day FINDINGS: Brain: Redemonstrated  intraparenchymal hemorrhage in the right frontal lobe with smaller hemorrhage in the left frontal lobe and small amount of hemorrhage along the anterior falx. The size of the hemorrhage appears similar when comparing across modalities. There may be a small amount of nearby right frontal subarachnoid hemorrhage. There is no surrounding restricted diffusion to suggest hemorrhagic transformation of infarct. No visible mass lesion, although acute blood products limit evaluation. There is wispy enhancement in the left frontal lobe which is not masslike. The adjacent superior sagittal sinus appears patent. Mild surrounding edema. Minimal leftward midline shift  at the site of hemorrhage, similar to prior. Remote right cerebellar lacunar infarct. No hydrocephalus. Basal cisterns are patent. Additional scattered T2/FLAIR hyperintensities within the white matter, most likely related to chronic microvascular ischemic disease. Vascular: Major arterial flow voids are maintained at the skull base. Skull and upper cervical spine: Normal marrow signal. Sinuses/Orbits: Sinuses are largely clear.  Unremarkable orbits. Other: Trace right mastoid effusion. IMPRESSION: 1. Right frontal (and smaller left frontal) intraparenchymal hemorrhages, likely similar in size when comparing across modalities. Small volume of adjacent hemorrhage along the falx and suspected trace right frontal subarachnoid hemorrhage. No surrounding restricted diffusion to suggest hemorrhagic transformation of infarct. No visible mass lesion, although acute blood products limit evaluation. A follow-up MRI after resolution of hemorrhage could further evaluate for underlying abnormality. 2. 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 Electronically Signed   By: Margaretha Sheffield MD   On: 06/23/2020 13:28   ECHOCARDIOGRAM COMPLETE  Result Date: 06/24/2020    ECHOCARDIOGRAM REPORT   Patient Name:   Valentina Gu Date of  Exam: 06/24/2020 Medical Rec #:  [id number]          Height:       66.0 in Accession #:    [id number]         Weight:       226.0 lb Date of Birth:  January 11, 1951          BSA:          2.106 m Patient Age:    84 years           BP:           124/74 mmHg Patient Gender: F                  HR:           79 bpm. Exam Location:  Inpatient Procedure: 2D Echo, Cardiac Doppler and Color Doppler Indications:    Stroke  History:        Patient has no prior history of Echocardiogram examinations.                 Risk Factors:Dyslipidemia, Hypertension, Diabetes and Former                 Smoker.  Sonographer:    Clayton Lefort RDCS (AE) Referring Phys: [id number] [place]  1. Left ventricular ejection fraction, by estimation, is 60 to 65%. The left ventricle has normal function. The left ventricle has no regional wall motion abnormalities. There is moderate concentric left ventricular hypertrophy. Left ventricular diastolic parameters are consistent with Grade II diastolic dysfunction (pseudonormalization).  2. Right ventricular systolic function is normal. The right ventricular size is normal.  3. The mitral valve is normal in structure. Mild mitral valve regurgitation. No evidence of mitral stenosis.  4. The aortic valve is normal in structure. Aortic valve regurgitation is not visualized. No aortic stenosis is present.  5. The inferior vena cava is normal in size with greater than 50% respiratory variability, suggesting right atrial pressure of 3 mmHg. Conclusion(s)/Recommendation(s): No intracardiac source of embolism detected on this transthoracic study. A transesophageal echocardiogram is recommended to exclude cardiac source of embolism if clinically indicated. FINDINGS  Left Ventricle: Left ventricular ejection fraction, by estimation, is 60 to 65%. The left ventricle has normal function. The left ventricle has no regional wall motion abnormalities. The left ventricular internal cavity size was normal in size. There  is  moderate concentric left  ventricular hypertrophy. Left ventricular diastolic parameters are consistent with Grade II diastolic dysfunction (pseudonormalization). Right Ventricle: The right ventricular size is normal. No increase in right ventricular wall thickness. Right ventricular systolic function is normal. Left Atrium: Left atrial size was normal in size. Right Atrium: Right atrial size was normal in size. Pericardium: There is no evidence of pericardial effusion. Mitral Valve: The mitral valve is normal in structure. Mild mitral valve regurgitation. No evidence of mitral valve stenosis. MV peak gradient, 3.9 mmHg. The mean mitral valve gradient is 1.0 mmHg. Tricuspid Valve: The tricuspid valve is normal in structure. Tricuspid valve regurgitation is not demonstrated. No evidence of tricuspid stenosis. Aortic Valve: The aortic valve is normal in structure. Aortic valve regurgitation is not visualized. No aortic stenosis is present. Aortic valve mean gradient measures 4.0 mmHg. Aortic valve peak gradient measures 6.9 mmHg. Aortic valve area, by VTI measures 2.45 cm. Pulmonic Valve: The pulmonic valve was normal in structure. Pulmonic valve regurgitation is not visualized. No evidence of pulmonic stenosis. Aorta: The aortic root is normal in size and structure. Venous: The inferior vena cava is normal in size with greater than 50% respiratory variability, suggesting right atrial pressure of 3 mmHg. IAS/Shunts: No atrial level shunt detected by color flow Doppler.  LEFT VENTRICLE PLAX 2D LVIDd:         4.20 cm  Diastology LVIDs:         2.70 cm  LV e' medial:    6.74 cm/s LV PW:         1.30 cm  LV E/e' medial:  9.0 LV IVS:        1.40 cm  LV e' lateral:   9.03 cm/s LVOT diam:     2.00 cm  LV E/e' lateral: 6.7 LV SV:         63 LV SV Index:   30 LVOT Area:     3.14 cm  RIGHT VENTRICLE            IVC RV Basal diam:  2.70 cm    IVC diam: 2.30 cm RV S prime:     9.30 cm/s TAPSE (M-mode): 1.9 cm LEFT ATRIUM              Index       RIGHT ATRIUM           Index LA diam:        3.90 cm 1.85 cm/m  RA Area:     14.70 cm LA Vol (A2C):   60.2 ml 28.58 ml/m RA Volume:   36.90 ml  17.52 ml/m LA Vol (A4C):   54.8 ml 26.02 ml/m LA Biplane Vol: 61.9 ml 29.39 ml/m  AORTIC VALVE AV Area (Vmax):    2.21 cm AV Area (Vmean):   1.94 cm AV Area (VTI):     2.45 cm AV Vmax:           131.00 cm/s AV Vmean:          92.600 cm/s AV VTI:            0.258 m AV Peak Grad:      6.9 mmHg AV Mean Grad:      4.0 mmHg LVOT Vmax:         92.00 cm/s LVOT Vmean:        57.300 cm/s LVOT VTI:          0.201 m LVOT/AV VTI ratio: 0.78  AORTA Ao Root diam: 3.30 cm Ao Asc  diam:  3.10 cm MITRAL VALVE MV Area (PHT): 2.69 cm    SHUNTS MV Area VTI:   2.76 cm    Systemic VTI:  0.20 m MV Peak grad:  3.9 mmHg    Systemic Diam: 2.00 cm MV Mean grad:  1.0 mmHg MV Vmax:       0.99 m/s MV Vmean:      48.0 cm/s MV Decel Time: 282 msec MV E velocity: 60.80 cm/s MV A velocity: 75.40 cm/s MV E/A ratio:  0.81 Ena Dawley MD Electronically signed by Ena Dawley MD Signature Date/Time: 06/24/2020/3:46:30 PM    Final     ***  Lavon Paganini Zaron Zwiefelhofer, PA-C 06/25/2020

## 2020-06-24 NOTE — Evaluation (Addendum)
Occupational Therapy Evaluation Patient Details Name: Cheryl Ortiz MRN: [id number] DOB: [birth year] Today's Date: 06/24/2020    History of Present Illness The pt is a 70 yo female presenting with L-sided weakness and flat affect. Imaging revealed R frontal lobe ICH. PMH includes: aneurysm, HLD, HTN, bilateral neuropathy (feet), DM II, and kidney stones.   Clinical Impression   Pt admitted with above. She demonstrates the below listed deficits and will benefit from continued OT to maximize safety and independence with BADLs.  Pt presents to OT with significant cognitive and perceptual deficits that severely impact her ability to complete ADLs and functional mobility.  Motor perseveration noted as well as deficits with initiation, poor problem solving, poor attention to task, Lt neglect which is significantly worse if there is a distraction on her Rt side.  She requires mod - max A for all aspects of ADLs and functional mobility largely limited by the severity of her cognitive and perceptual deficits.  She reports she lives with her brother, who is blind, and she reports she was fully independent PTA.  She will require post acute rehab at discharge and subsequent 24 hour supervision/assist - she is at very high risk for injury due to her level of distraction and neglect when engaged in activities.   Will continue to follow.       Follow Up Recommendations  CIR;Supervision/Assistance - 24 hour    Equipment Recommendations  None recommended by OT    Recommendations for Other Services Rehab consult     Precautions / Restrictions Precautions Precautions: Fall Precaution Comments: Lt neglect and poor attention      Mobility Bed Mobility Overal bed mobility: Needs Assistance Bed Mobility: Supine to Sit Rolling: Max assist   Supine to sit: Max assist     General bed mobility comments: Pt unable to initiate Lt LE movement on her own, requiring my assist to do so.  Pt distracted and  unable to sequence task requirng assist to lift trunk .    Transfers Overall transfer level: Needs assistance Equipment used: 1 person hand held assist Transfers: Stand Pivot Transfers;Sit to/from Stand Sit to Stand: Mod assist Stand pivot transfers: Mod assist       General transfer comment: requires assist to initiate activity.  Once assist provided to initiate activity, she was able to turn toward the chair, but became distracted and unable to re initiate the task without physical assistance.  Pt with poorly controlled descent into the chair    Balance Overall balance assessment: Needs assistance Sitting-balance support: Single extremity supported;Feet supported Sitting balance-Leahy Scale: Fair     Standing balance support: Single extremity supported Standing balance-Leahy Scale: Poor                             ADL either performed or assessed with clinical judgement   ADL Overall ADL's : Needs assistance/impaired Eating/Feeding: Moderate assistance;Sitting   Grooming: Wash/dry hands;Wash/dry face;Oral care;Moderate assistance;Sitting Grooming Details (indicate cue type and reason): Pt self distracts and requires assist to complete tasks. Upper Body Bathing: Sitting;Maximal assistance   Lower Body Bathing: Maximal assistance;Sit to/from stand   Upper Body Dressing : Maximal assistance;Sitting   Lower Body Dressing: Sit to/from stand;Maximal assistance Lower Body Dressing Details (indicate cue type and reason): Pt was instructed to take of her socks.  She required the request to be repeated 4 times before she could initiate it. She eventually pulled sock half way off, then  became distracted by the need to scratch her foot, and was unable to complete the task. Toilet Transfer: Moderate assistance;Stand-pivot;BSC Toilet Transfer Details (indicate cue type and reason): verbal cues and mod A to fully turn to the chair before attempting to sit Toileting- Clothing  Manipulation and Hygiene: Maximal assistance;Sit to/from stand       Functional mobility during ADLs: Moderate assistance       Vision Baseline Vision/History: Wears glasses Wears Glasses: At all times Patient Visual Report: No change from baseline Additional Comments: pt unable to sustain attention to complete formal assessment.  She was able to correctly read the clock on the wall which was slightly to her Lt     Perception Perception Perception Tested?: Yes Perception Deficits: Inattention/neglect Inattention/Neglect: Does not attend to left visual field;Does not attend to left side of body Spatial deficits: Pt will spontaneously look to Lt and will spontaneously use Lt UE and Lt LE, but if there is someting distracting on her Rt side, she is unable to direct her attention the [place] tested?: Deficits Deficits: Initiation;Perseveration;Organization Praxis-Other Comments: see comments under cognition and ADLs.  Motor perseveration noted.  defiicts with initiation noted    Pertinent Vitals/Pain Pain Assessment: No/denies pain Faces Pain Scale: No hurt     Hand Dominance Right   Extremity/Trunk Assessment Upper Extremity Assessment Upper Extremity Assessment: Generalized weakness LUE Deficits / Details: pt moves Lt UE actively, but doesn't lift Lt UE  when prompted.  Lt shoulder appears weak. LUE Coordination: decreased gross motor   Lower Extremity Assessment Lower Extremity Assessment: Defer to PT evaluation   Cervical / Trunk Assessment Cervical / Trunk Assessment: Normal   Communication Communication Communication: No difficulties   Cognition Arousal/Alertness: Awake/alert Behavior During Therapy: Flat affect Overall Cognitive Status: Impaired/Different from baseline Area of Impairment: Attention;Memory;Following commands;Safety/judgement;Awareness;Problem solving                   Current Attention Level: Sustained (with up to max  cues) Memory: Decreased short-term memory Following Commands: Follows one step commands inconsistently;Follows one step commands with increased time Safety/Judgement: Decreased awareness of deficits;Decreased awareness of safety Awareness: Intellectual Problem Solving: Slow processing;Decreased initiation;Difficulty sequencing;Requires verbal cues;Requires tactile cues General Comments: Pt noted with motor perserveration, and is very distracted.  Upon my entrance on pt's Lt, pt rubbing a water bottle label with her Rt hand and looking to the Rt.  Pt verbally greeted me.  she answered all questions, but did not attempt to turn head and look to to me on her left.  When asked what color hair I had, she made up a color, but did not initiate looking at me on her left side.  When she was verbally cued to look at me, she still was unable to due so secondary to being too distracted by rubbing the label which was on her Rt side.  She eventually did turn to her left once I moved to her right side, and instructed her to follow me with her eyes.  She required >4 mins to initiate moving Lt LE off the bed in order to move to the EOB.  She moved her Rt LE off the bed, but made no attempt to move her Lt LE despite max cues and prompting.  She eventually required max A to initiate this.  Pt attempted to change the channels of her TV, but instead decreased the volume while perseverating on pushing the button repeatedly with no ability to recognize  her error, or correct her error without max prompting and assist from me.   General Comments  VSS    Exercises     Shoulder Instructions      Home Living Family/patient expects to be discharged to:: Private residence Living Arrangements: Other relatives Available Help at Discharge: Family;Available PRN/intermittently Type of Home: House Home Access: Level entry     Home Layout: One level     Bathroom Shower/Tub: Chief Strategy Officer: Standard      Home Equipment: None   Additional Comments: Pt's brother, who is blind, lives with her.      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        OT Problem List: Decreased strength;Decreased range of motion;Decreased activity tolerance;Impaired balance (sitting and/or standing);Impaired vision/perception;Decreased cognition;Decreased safety awareness;Decreased knowledge of use of DME or AE;Impaired UE functional use      OT Treatment/Interventions: Self-care/ADL training;Energy conservation;DME and/or AE instruction;Therapeutic activities;Cognitive remediation/compensation;Visual/perceptual remediation/compensation;Patient/family education;Balance training    OT Goals(Current goals can be found in the care plan section) Acute Rehab OT Goals Patient Stated Goal: did not state OT Goal Formulation: With patient Time For Goal Achievement: 07/08/20 Potential to Achieve Goals: Good ADL Goals Pt Will Perform Eating: with supervision;with set-up;sitting Pt Will Perform Grooming: with min guard assist;standing Pt Will Perform Upper Body Bathing: with set-up;with supervision;sitting Pt Will Perform Lower Body Bathing: with min assist;sit to/from stand Pt Will Perform Upper Body Dressing: with min assist;sitting Pt Will Perform Lower Body Dressing: with min assist;sit to/from stand Pt Will Transfer to Toilet: with min assist;ambulating;regular height toilet;bedside commode;grab bars Pt Will Perform Toileting - Clothing Manipulation and hygiene: with min assist;sit to/from stand Additional ADL Goal #1: Pt will complete simple grooming tasks within 3 mins with mod prompting Additional ADL Goal #2: Pt will locate needed ADL items with min cues on her Lt  OT Frequency: Min 3X/week   Barriers to D/C:    unsure level of support pt will have at discharge        Co-evaluation              AM-PAC OT "6 Clicks" Daily Activity     Outcome Measure Help from another person eating meals?: A Lot Help from another person taking care of personal grooming?: A Lot Help from another person toileting, which includes using toliet, bedpan, or urinal?: A Lot Help from another person bathing (including washing, rinsing, drying)?: A Lot Help from another person to put on and taking off regular upper body clothing?: A Lot Help from another person to put on and taking off regular lower body clothing?: A Lot 6 Click Score: 12   End of Session Equipment Utilized During Treatment: Gait belt Nurse Communication: Mobility status  Activity Tolerance: Patient tolerated treatment well Patient left: in chair;with call bell/phone within reach;with chair alarm set  OT Visit Diagnosis: Unsteadiness on feet (R26.81);Cognitive communication deficit (R41.841);Muscle weakness (generalized) (M62.81) Symptoms and signs involving cognitive functions: Cerebral infarction                Time: 1030-1100 OT Time Calculation (min): 30 min Charges:  OT General Charges $OT Visit: 1 Visit OT Evaluation $OT Eval Moderate Complexity: 1 Mod OT Treatments $Self Care/Home Management : 8-22 mins  Eber Jones., OTR/L Acute Rehabilitation Services Pager [phone] Office [phone]   Jeani Hawking M 06/24/2020,  11:40 AM

## 2020-06-24 NOTE — Progress Notes (Signed)
Inpatient Rehab Admissions Coordinator Note:   Per therapy recommendations, pt was screened for CIR candidacy by Dandrea Widdowson, MS CCC-SLP. At this time, Pt. Appears to have functional decline and is a good candidate for CIR. Will request order for rehab consult per protocol.  Please contact me with questions.   Shila Kruczek, MS, CCC-SLP Rehab Admissions Coordinator  [phone] (celll) [phone] (office)  

## 2020-06-24 NOTE — Progress Notes (Addendum)
STROKE TEAM PROGRESS NOTE   INTERVAL HISTORY RN is at the bedside.  Pt still has mild abulia, but awake alert following commands. Still has mild left hemiparesis and PT recommended CIR. MRI with and without showed stable right frontal ICH and no CAA pattern or underlying mass lesion.   OBJECTIVE Vitals:   06/24/20 0600 06/24/20 0800 06/24/20 0900 06/24/20 1000  BP: 106/67 126/76 119/67 126/64  Pulse: 70 72 84 76  Resp: 18 19 (!) 21 (!) 23  Temp:  98.9 F (37.2 C)    TempSrc:  Oral    SpO2: 96% 98% 97% 98%  Weight:      Height:        CBC:  Recent Labs  Lab 06/22/20 1918 06/23/20 1430 06/24/20 0301  WBC 9.9 6.9 6.8  NEUTROABS 7.9*  --   --   HGB 14.0 13.1 12.3  HCT 44.6 40.6 37.3  MCV 92.3 91.2 89.9  PLT 335 285 [id number]    Basic Metabolic Panel:  Recent Labs  Lab 06/23/20 1430 06/24/20 0301  NA 136 139  K 3.2* 3.6  CL 102 106  CO2 22 23  GLUCOSE 158* 131*  BUN 16 16  CREATININE 0.97 0.96  CALCIUM 9.2 9.2    Lipid Panel:     Component Value Date/Time   CHOL 133 06/23/2020 0607   TRIG 103 06/23/2020 0607   HDL 46 06/23/2020 0607   CHOLHDL 2.9 06/23/2020 0607   VLDL 21 06/23/2020 0607   LDLCALC 66 06/23/2020 0607   HgbA1c:  Lab Results  Component Value Date   HGBA1C 6.2 (H) 06/23/2020   Urine Drug Screen:     Component Value Date/Time   LABOPIA NONE DETECTED 06/23/2020 0936   COCAINSCRNUR NONE DETECTED 06/23/2020 0936   LABBENZ NONE DETECTED 06/23/2020 0936   AMPHETMU NONE DETECTED 06/23/2020 0936   THCU NONE DETECTED 06/23/2020 0936   LABBARB NONE DETECTED 06/23/2020 0936    Alcohol Level     Component Value Date/Time   ETH <10 09/27/2019 1108    IMAGING  CT Angio Head W or Wo Contrast  06/23/2020 IMPRESSION:  1. No sign of high flow vascular malformation in the region of the right frontal hemorrhage. No sign of venous thrombosis. Cortical venous thrombosis can be difficult to see by CT.  2. No change in the appearance of the right frontal  lobe intraparenchymal hemorrhage with mild surrounding edema. No mass effect or shift.   CT Head Wo Contrast 06/23/2020 IMPRESSION:  Area of hemorrhage within the anteromedial right frontal lobe. Adjacent small amount of blood to the left side of the falx could be within the left frontal lobe or subarachnoid blood. No mass effect or midline shift.  CT Angio Neck W and/or Wo Contrast 06/23/2020 IMPRESSION:  1. Minimal calcified plaque at the right ICA bulb but no stenosis.  2. No left carotid bifurcation disease.  3. Both vertebral arteries appear normal through the cervical region to the foramen magnum.  4. Aortic atherosclerosis. Aortic Atherosclerosis (ICD10-I70.0).   CT VENOGRAM HEAD 06/23/2020 IMPRESSION:  No visible venous thrombosis. Superficial venous thrombosis can be difficult to see by CT.   MR BRAIN W WO CONTRAST 06/23/2020 IMPRESSION:  1. Right frontal (and smaller left frontal) intraparenchymal hemorrhages, likely similar in size when comparing across modalities. Small volume of adjacent hemorrhage along the falx and suspected trace right frontal subarachnoid hemorrhage. No surrounding restricted diffusion to suggest hemorrhagic transformation of infarct. No visible mass lesion, although acute blood products  limit evaluation. A follow-up MRI after resolution of hemorrhage could further evaluate for underlying abnormality.  2. 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    ECG - SR rate 70 BPM with PACs (See cardiology reading for complete details)   PHYSICAL EXAM Temp:  [97.6 F (36.4 C)-99.1 F (37.3 C)] 98.9 F (37.2 C) (02/06 0800) Pulse Rate:  [66-84] 76 (02/06 1000) Resp:  [15-23] 23 (02/06 1000) BP: (99-143)/(51-86) 126/64 (02/06 1000) SpO2:  [94 %-98 %] 98 % (02/06 1000)  General - Well nourished, well developed, in no apparent distress, mild flat affect.  Ophthalmologic - fundi not visualized due to  noncooperation.  Cardiovascular - Regular rhythm and rate.  Mental Status -  Level of arousal and orientation to time, place, and person were intact. Language including expression, naming, repetition, comprehension was assessed and found intact. Fund of Knowledge was assessed and was intact.  Cranial Nerves II - XII - II - Visual field intact OU. III, IV, VI - Extraocular movements intact. Seems to have right gaze preference but able to have complete left gaze with prompt. V - Facial sensation intact bilaterally. VII - slight left facial droop. VIII - Hearing & vestibular intact bilaterally. X - Palate elevates symmetrically. XI - Chin turning & shoulder shrug intact bilaterally. XII - Tongue protrusion intact.  Motor Strength - The patient's strength was normal in right upper and lower extremities, however, left UE 3+/5 proximal and 4/5 bicep tricep and 4+/5 finger grip with left pronator drift, left LE proximal and distal 4/5.  Bulk was normal and fasciculations were absent.   Motor Tone - Muscle tone was assessed at the neck and appendages and was normal.  Reflexes - The patient's reflexes were symmetrical in all extremities and she had no pathological reflexes.  Sensory - Light touch, temperature/pinprick were assessed and were symmetrical.    Coordination - The patient had normal movements in the hands with no ataxia or dysmetria.  Tremor was absent.  Gait and Station - deferred.    ASSESSMENT/PLAN [name] is a 70 y.o. 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 Found to have a R frontal lobe ICH while on ASA 81 mg daily.  She did not receive IV t-PA due to [place].  ICH - right frontal lobe ICH, etiology unclear  CT head - Area of hemorrhage within the anteromedial right frontal lobe. Adjacent small amount of blood to the left side of the falx  could be within the left frontal lobe or subarachnoid blood. No mass effect or midline shift.  CTA Head and neck - unremarkable.   CT Venogram Head - No visible venous thrombosis.   MRI head W/WO - Right frontal (and smaller left frontal) intraparenchymal hemorrhages, likely similar in size when comparing across modalities.  Recommend to repeat MRI with and without after hematoma resolves  2D Echo - pending  [employer] Virus 2 - negative  LDL - 66  HgbA1c - 6.2  UDS neg  VTE prophylaxis - SCDs  aspirin 81 mg daily prior to admission, now on No antithrombotic due to [place]  Ongoing aggressive stroke risk factor management  Therapy recommendations:  CIR  Disposition:  Pending  Hypertension  Home BP meds: HCTZ  Current BP meds: Labetalol prn  Stable . SBP goal < 160 mm Hg. . Long-term BP  goal normotensive  Diabetes  Home diabetic meds: metformin  HgbA1c 6.2, goal < 7.0  SSI  CBG monitoring  Close PCP follow-up   Other Stroke Risk Factors  Advanced age  Former cigarette smoker - quit > 22 years ago  Previous ETOH use  Obesity, Body mass index is 36.48 kg/m., recommend weight loss, diet and exercise as appropriate   Other Active Problems   Code status - Full code Hypokalemia - potassium - 2.9->3.2->3.6 Aortic Atherosclerosis (ICD10-I70.0).   Hospital day # 1   Rosalin Hawking, MD PhD Stroke Neurology 06/24/2020 11:26 AM   To contact Stroke Continuity provider, please refer to [email or web address] After hours, contact General Neurology

## 2020-06-25 ENCOUNTER — Encounter (HOSPITAL_COMMUNITY): Payer: Self-pay | Admitting: Physical Medicine & Rehabilitation

## 2020-06-25 ENCOUNTER — Other Ambulatory Visit: Payer: Self-pay

## 2020-06-25 ENCOUNTER — Inpatient Hospital Stay (HOSPITAL_COMMUNITY)
Admission: RE | Admit: 2020-06-25 | Discharge: 2020-07-19 | DRG: 057 | Disposition: A | Discharge status: Home / Self Care | Payer: Medicare Other | Source: Intra-hospital | Attending: Physical Medicine & Rehabilitation | Admitting: Physical Medicine & Rehabilitation

## 2020-06-25 DIAGNOSIS — I611 Nontraumatic intracerebral hemorrhage in hemisphere, cortical: Secondary | ICD-10-CM | POA: Diagnosis not present

## 2020-06-25 DIAGNOSIS — R7309 Other abnormal glucose: Secondary | ICD-10-CM

## 2020-06-25 DIAGNOSIS — Z8249 Family history of ischemic heart disease and other diseases of the circulatory system: Secondary | ICD-10-CM

## 2020-06-25 DIAGNOSIS — I69190 Apraxia following nontraumatic intracerebral hemorrhage: Secondary | ICD-10-CM | POA: Diagnosis not present

## 2020-06-25 DIAGNOSIS — Z803 Family history of malignant neoplasm of breast: Secondary | ICD-10-CM | POA: Diagnosis not present

## 2020-06-25 DIAGNOSIS — Z83438 Family history of other disorder of lipoprotein metabolism and other lipidemia: Secondary | ICD-10-CM

## 2020-06-25 DIAGNOSIS — Z87442 Personal history of urinary calculi: Secondary | ICD-10-CM

## 2020-06-25 DIAGNOSIS — I610 Nontraumatic intracerebral hemorrhage in hemisphere, subcortical: Secondary | ICD-10-CM | POA: Diagnosis not present

## 2020-06-25 DIAGNOSIS — Z7984 Long term (current) use of oral hypoglycemic drugs: Secondary | ICD-10-CM

## 2020-06-25 DIAGNOSIS — R2689 Other abnormalities of gait and mobility: Secondary | ICD-10-CM | POA: Diagnosis present

## 2020-06-25 DIAGNOSIS — E876 Hypokalemia: Secondary | ICD-10-CM | POA: Diagnosis present

## 2020-06-25 DIAGNOSIS — I619 Nontraumatic intracerebral hemorrhage, unspecified: Secondary | ICD-10-CM | POA: Diagnosis not present

## 2020-06-25 DIAGNOSIS — E785 Hyperlipidemia, unspecified: Secondary | ICD-10-CM | POA: Diagnosis present

## 2020-06-25 DIAGNOSIS — I69119 Unspecified symptoms and signs involving cognitive functions following nontraumatic intracerebral hemorrhage: Secondary | ICD-10-CM | POA: Diagnosis not present

## 2020-06-25 DIAGNOSIS — Z79899 Other long term (current) drug therapy: Secondary | ICD-10-CM | POA: Diagnosis not present

## 2020-06-25 DIAGNOSIS — I69198 Other sequelae of nontraumatic intracerebral hemorrhage: Secondary | ICD-10-CM | POA: Diagnosis not present

## 2020-06-25 DIAGNOSIS — Z8261 Family history of arthritis: Secondary | ICD-10-CM | POA: Diagnosis not present

## 2020-06-25 DIAGNOSIS — R0989 Other specified symptoms and signs involving the circulatory and respiratory systems: Secondary | ICD-10-CM

## 2020-06-25 DIAGNOSIS — I952 Hypotension due to drugs: Secondary | ICD-10-CM | POA: Diagnosis not present

## 2020-06-25 DIAGNOSIS — I69154 Hemiplegia and hemiparesis following nontraumatic intracerebral hemorrhage affecting left non-dominant side: Principal | ICD-10-CM

## 2020-06-25 DIAGNOSIS — E1165 Type 2 diabetes mellitus with hyperglycemia: Secondary | ICD-10-CM | POA: Diagnosis not present

## 2020-06-25 DIAGNOSIS — S0093XA Contusion of unspecified part of head, initial encounter: Secondary | ICD-10-CM | POA: Diagnosis not present

## 2020-06-25 DIAGNOSIS — I629 Nontraumatic intracranial hemorrhage, unspecified: Secondary | ICD-10-CM | POA: Diagnosis not present

## 2020-06-25 DIAGNOSIS — L8990 Pressure ulcer of unspecified site, unspecified stage: Secondary | ICD-10-CM | POA: Insufficient documentation

## 2020-06-25 DIAGNOSIS — Z23 Encounter for immunization: Secondary | ICD-10-CM

## 2020-06-25 DIAGNOSIS — L89312 Pressure ulcer of right buttock, stage 2: Secondary | ICD-10-CM | POA: Diagnosis not present

## 2020-06-25 DIAGNOSIS — Z87891 Personal history of nicotine dependence: Secondary | ICD-10-CM

## 2020-06-25 DIAGNOSIS — R41840 Attention and concentration deficit: Secondary | ICD-10-CM | POA: Diagnosis not present

## 2020-06-25 DIAGNOSIS — Z885 Allergy status to narcotic agent status: Secondary | ICD-10-CM

## 2020-06-25 DIAGNOSIS — Z888 Allergy status to other drugs, medicaments and biological substances status: Secondary | ICD-10-CM | POA: Diagnosis not present

## 2020-06-25 DIAGNOSIS — M25572 Pain in left ankle and joints of left foot: Secondary | ICD-10-CM | POA: Diagnosis not present

## 2020-06-25 DIAGNOSIS — I10 Essential (primary) hypertension: Secondary | ICD-10-CM

## 2020-06-25 DIAGNOSIS — M79672 Pain in left foot: Secondary | ICD-10-CM

## 2020-06-25 DIAGNOSIS — G936 Cerebral edema: Secondary | ICD-10-CM | POA: Diagnosis not present

## 2020-06-25 DIAGNOSIS — Z6836 Body mass index (BMI) 36.0-36.9, adult: Secondary | ICD-10-CM | POA: Diagnosis not present

## 2020-06-25 DIAGNOSIS — L89322 Pressure ulcer of left buttock, stage 2: Secondary | ICD-10-CM | POA: Diagnosis not present

## 2020-06-25 DIAGNOSIS — E1159 Type 2 diabetes mellitus with other circulatory complications: Secondary | ICD-10-CM | POA: Diagnosis not present

## 2020-06-25 DIAGNOSIS — Z8679 Personal history of other diseases of the circulatory system: Secondary | ICD-10-CM | POA: Diagnosis not present

## 2020-06-25 DIAGNOSIS — E1140 Type 2 diabetes mellitus with diabetic neuropathy, unspecified: Secondary | ICD-10-CM | POA: Diagnosis present

## 2020-06-25 DIAGNOSIS — E669 Obesity, unspecified: Secondary | ICD-10-CM | POA: Diagnosis present

## 2020-06-25 LAB — BASIC METABOLIC PANEL
Anion gap: 10 (ref 5–15)
BUN: 15 mg/dL (ref 8–23)
CO2: 23 mmol/L (ref 22–32)
Calcium: 9.2 mg/dL (ref 8.9–10.3)
Chloride: 104 mmol/L (ref 98–111)
Creatinine, Ser: 0.95 mg/dL (ref 0.44–1.00)
GFR, Estimated: >60 mL/min (ref >60)
Glucose, Bld: 116 mg/dL — ABNORMAL HIGH (ref 70–99)
Potassium: 3.3 mmol/L — ABNORMAL LOW (ref 3.5–5.1)
Sodium: 137 mmol/L (ref 135–145)

## 2020-06-25 LAB — GLUCOSE, CAPILLARY
Glucose-Capillary: 116 mg/dL — ABNORMAL HIGH (ref 70–99)
Glucose-Capillary: 125 mg/dL — ABNORMAL HIGH (ref 70–99)
Glucose-Capillary: 127 mg/dL — ABNORMAL HIGH (ref 70–99)
Glucose-Capillary: 84 mg/dL (ref 70–99)
Glucose-Capillary: 98 mg/dL (ref 70–99)
Glucose-Capillary: 99 mg/dL (ref 70–99)

## 2020-06-25 LAB — CBC
HCT: 36.8 % (ref 36.0–46.0)
Hemoglobin: 12.3 g/dL (ref 12.0–15.0)
MCH: 29.7 pg (ref 26.0–34.0)
MCHC: 33.4 g/dL (ref 30.0–36.0)
MCV: 88.9 fL (ref 80.0–100.0)
Platelets: 273 10*3/uL (ref 150–400)
RBC: 4.14 MIL/uL (ref 3.87–5.11)
RDW: 13.5 % (ref 11.5–15.5)
WBC: 8.1 10*3/uL (ref 4.0–10.5)
nRBC: 0 % (ref 0.0–0.2)

## 2020-06-25 MED ORDER — POTASSIUM CHLORIDE CRYS ER 20 MEQ PO TBCR
40.0000 meq | EXTENDED_RELEASE_TABLET | Freq: Once | ORAL | Status: AC
  Administered 2020-06-25: 40 meq via ORAL
  Filled 2020-06-25: qty 2

## 2020-06-25 MED ORDER — INSULIN ASPART 100 UNIT/ML ~~LOC~~ SOLN
0.0000 [IU] | Freq: Three times a day (TID) | SUBCUTANEOUS | Status: DC
  Administered 2020-06-26 – 2020-06-27 (×3): 2 [IU] via SUBCUTANEOUS
  Administered 2020-06-27: 3 [IU] via SUBCUTANEOUS
  Administered 2020-06-28: 2 [IU] via SUBCUTANEOUS
  Administered 2020-06-28: 3 [IU] via SUBCUTANEOUS
  Administered 2020-06-29 (×2): 2 [IU] via SUBCUTANEOUS
  Administered 2020-06-30: 3 [IU] via SUBCUTANEOUS
  Administered 2020-06-30 – 2020-07-08 (×7): 2 [IU] via SUBCUTANEOUS

## 2020-06-25 MED ORDER — HYDROCHLOROTHIAZIDE 25 MG PO TABS
25.0000 mg | ORAL_TABLET | Freq: Every day | ORAL | Status: DC
  Administered 2020-06-25 – 2020-06-27 (×3): 25 mg via ORAL
  Filled 2020-06-25 (×3): qty 1

## 2020-06-25 MED ORDER — ACETAMINOPHEN 160 MG/5ML PO SOLN: 650.0000 mg | ORAL | Status: DC | PRN

## 2020-06-25 MED ORDER — METFORMIN HCL 500 MG PO TABS
1000.0000 mg | ORAL_TABLET | Freq: Every day | ORAL | Status: DC
  Administered 2020-06-26 – 2020-07-19 (×24): 1000 mg via ORAL
  Filled 2020-06-25 (×24): qty 2

## 2020-06-25 MED ORDER — ACETAMINOPHEN 650 MG RE SUPP: 650.0000 mg | RECTAL | Status: DC | PRN

## 2020-06-25 MED ORDER — PANTOPRAZOLE SODIUM 40 MG PO TBEC
40.0000 mg | DELAYED_RELEASE_TABLET | Freq: Every day | ORAL | Status: DC
  Administered 2020-06-26 – 2020-07-19 (×24): 40 mg via ORAL
  Filled 2020-06-25 (×24): qty 1

## 2020-06-25 MED ORDER — PNEUMOCOCCAL VAC POLYVALENT 25 MCG/0.5ML IJ INJ
0.5000 mL | INJECTION | INTRAMUSCULAR | Status: AC
  Administered 2020-06-27: 0.5 mL via INTRAMUSCULAR
  Filled 2020-06-25: qty 0.5

## 2020-06-25 MED ORDER — ACETAMINOPHEN 325 MG PO TABS
650.0000 mg | ORAL_TABLET | ORAL | Status: DC | PRN
  Administered 2020-06-29 – 2020-07-03 (×3): 650 mg via ORAL
  Filled 2020-06-25 (×3): qty 2

## 2020-06-25 MED ORDER — INFLUENZA VAC A&B SA ADJ QUAD 0.5 ML IM PRSY
0.5000 mL | PREFILLED_SYRINGE | INTRAMUSCULAR | Status: AC
  Administered 2020-06-29: 0.5 mL via INTRAMUSCULAR
  Filled 2020-06-25: qty 0.5

## 2020-06-25 MED ORDER — ALBUTEROL SULFATE HFA 108 (90 BASE) MCG/ACT IN AERS
2.0000 | INHALATION_SPRAY | RESPIRATORY_TRACT | Status: DC | PRN
  Filled 2020-06-25: qty 6.7

## 2020-06-25 MED ORDER — SENNOSIDES-DOCUSATE SODIUM 8.6-50 MG PO TABS
1.0000 | ORAL_TABLET | Freq: Two times a day (BID) | ORAL | Status: DC
  Administered 2020-06-25 – 2020-06-29 (×4): 1 via ORAL
  Filled 2020-06-25 (×20): qty 1

## 2020-06-25 NOTE — PMR Pre-admission (Signed)
PMR Admission Coordinator Pre-Admission Assessment  Patient: MILI PILTZ is an 70 y.o., female MRN: [id number] DOB: 24-Jan-1951 Height: _0  (167.6 cm) Weight: 102.5 kg  Insurance Information HMO:     PPO:      PCP:      IPA:      80/20: yes     OTHER:  PRIMARY: Medicare Part A and B       Policy#: [id number]      Subscriber: pt CM Name:       Phone#:      Fax#:  Pre-Cert#:       Employer:  Benefits:  Phone #: online     Name: per Katherine Roan. [facility] [place] on 06/25/20 Eff. Date: Part A and B effective: 07/17/2017     Deduct: $1,556      Out of Pocket Max: NA      Life Max: NA CIR: Covered per Medicare guidelines once yearly deductible has been met      SNF: days 1-20, 100%; days 21-100, 80% Outpatient: 80%     Co-Pay: 20% Home Health: 100%      Co-Pay: DME: 80%     Co-Pay: 20% Providers: Pt's choice  SECONDARY: [facility]      Policy#: [id number]     Phone#: [phone]  Financial Counselor:      Phone#:   The "Data Collection Information Summary" for patients in Inpatient Rehabilitation Facilities with attached "Privacy Act [place] Records" was provided and verbally reviewed with: Patient and Family  Emergency Contact Information Contact Information    Name Relation Home Work Arvin Daughter [phone]  [phone]   Ralene Ok [phone]        Current Medical History  Patient Admitting Diagnosis: right frontal lobe ICH  History of Present Illness: tyeesha riker is a 70 year old right-handed female history of diabetes mellitus with peripheral neuropathy, hypertension, hyperlipidemia, kidney stones with stent placement 2017, she quit smoking 22 years ago as well as 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  Per chart review independent prior to admission.  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  1 level home with level entry.  Presented 06/22/2020 with acute onset  of left-sided weakness.  Cranial CT scan showed an area of hemorrhage within the anterior medial right frontal lobe.  Adjacent small amount of blood to the left side of the falx could be within the left frontal lobe or subarachnoid blood.  No mass-effect or midline shift.  CT angiogram of head and neck no sign of high flow vascular malformation in the region of the right frontal hemorrhage.  No signs of venous thrombosis.  MRI of the brain right frontal and smaller left frontal intraparenchymal hemorrhage likely small in size when comparing across modalities.  Small volume of adjacent hemorrhage along the falx and suspected trace right frontal subarachnoid hemorrhage.  No surrounding restricted diffusion to suggest hemorrhagic transformation or infarct.  No visible lesion noted.  Echocardiogram with ejection fraction of 60 to 65% no wall motion abnormalities grade 2 diastolic dysfunction.  Neurology follow-up close monitoring of blood pressure.  Tolerating a regular consistency diet.  Therapy evaluations completed due to patient's left-sided weakness. Pt is to be admitted for a comprehensive rehab program on 06/25/20.  Complete NIHSS TOTAL: 2  Patient's medical record from  [facility] has been reviewed by the rehabilitation admission coordinator and physician.  Past Medical  History  Past Medical History:  Diagnosis Date  . Aneurysm ([place]) 06/2015   brain-small will recheck in 1 year  . Cold   . Cystocele 07/12/2015  . Hyperlipidemia   . Hypertension   . Neuropathy of both feet    tingling/numbness bilateral feet ,rt arm,hands-nerve study scheduled  . Osteoarthritis   . Renal disorder    kidney stones  . Seasonal allergies   . Type 2 diabetes mellitus (HCC)     Family History   family history includes Arthritis in her mother; Breast cancer in her sister; Cancer in her mother; Cerebral palsy in her brother; Early death in her brother; Fibroids in her sister; Heart attack in her  brother; Heart disease in her brother; Hyperlipidemia in her sister; Hypertension in her brother, brother, father, and mother; Pneumonia in her father.  Prior Rehab/Hospitalizations Has the patient had prior rehab or hospitalizations prior to admission? Yes  Has the patient had major surgery during 100 days prior to admission? No   Current Medications  Current Facility-Administered Medications:  .   stroke: mapping our early stages of recovery book, , Does not apply, Once, Donnetta Simpers, MD .  acetaminophen (TYLENOL) tablet 650 mg, 650 mg, Oral, Q4H PRN, 650 mg at 06/23/20 0630 **OR** acetaminophen (TYLENOL) 160 MG/5ML solution 650 mg, 650 mg, Per Tube, Q4H PRN **OR** acetaminophen (TYLENOL) suppository 650 mg, 650 mg, Rectal, Q4H PRN, Donnetta Simpers, MD .  Chlorhexidine Gluconate Cloth 2 % PADS 6 each, 6 each, Topical, Daily, Donnetta Simpers, MD, 6 each at 06/24/20 1810 .  insulin aspart (novoLOG) injection 0-15 Units, 0-15 Units, Subcutaneous, TID WC, Donnetta Simpers, MD, 2 Units at 06/25/20 0815 .  insulin aspart (novoLOG) injection 4 Units, 4 Units, Subcutaneous, TID WC, Donnetta Simpers, MD, 4 Units at 06/25/20 1219 .  labetalol (NORMODYNE) injection 10-20 mg, 10-20 mg, Intravenous, Q2H PRN, Rosalin Hawking, MD .  oxyCODONE (Oxy IR/ROXICODONE) immediate release tablet 5 mg, 5 mg, Oral, Once, Donnetta Simpers, MD .  pantoprazole (PROTONIX) EC tablet 40 mg, 40 mg, Oral, Daily, Rosalin Hawking, MD, 40 mg at 06/25/20 0819 .  senna-docusate (Senokot-S) tablet 1 tablet, 1 tablet, Oral, BID, Donnetta Simpers, MD  Patients Current Diet:  Diet Order            Diet Carb Modified Fluid consistency: Thin; Room service appropriate? Yes with Assist  Diet effective now                 Precautions / Restrictions Precautions Precautions: Fall Precaution Comments: Lt neglect and poor attention Restrictions Weight Bearing Restrictions: No   Has the patient had 2 or more falls or  a fall with injury in the past year? 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 Independent  Indoor Mobility: Did the patient need assistance with walking from room to room (with or without device)? Independent  Stairs: Did the patient need assistance with internal or external stairs (with or without device)? Independent  Functional Cognition: Did the patient need help planning regular tasks such as shopping or remembering to take medications? Independent  Home Assistive Devices / Equipment Home Equipment: None  Prior Device Use: Indicate devices/aids used by the patient prior to current illness, exacerbation or injury? None of the above  Current Functional Level Cognition  Arousal/Alertness:  Awake/alert Overall Cognitive Status: Impaired/Different from baseline Current Attention Level: Sustained (with up to max cues) Orientation Level: Oriented X4 Following Commands: Follows one step commands inconsistently,Follows one step commands with increased time Safety/Judgement: Decreased awareness of deficits,Decreased awareness of safety General Comments: Pt noted with motor perserveration, and is very distracted.  Upon myentrance on pt's Lt, pt rubbing a water bottle label with her Rt hand and looking to the Rt.  Pt verbally greeted me.  she answered all questions, but did not attempt to turn head and look to to me on her left.  When asked what color hair I had, she made up a color, but did not initiate lookig to me on her lt.  When cued to look at me, she still was unable to due to being distracted by rubbing the label which was on her Rt.  She eventually did turn to her left, once I moved to her right and instructed her to follow me with her eyes.  She required >4 mins to  initiate moving Lt LE off the bed to move to the EOB.  She moved her Rt LE off the bed, but made no attempt to move her Lt LE despite max cues and prompting.  She eventually required max A to initiate this.  Pt attempted to change the channels of her TV, but instead decreased the volume perseverating on pushing the button.  She was unable to recognize her error, or correct her error without max prompting and assist from me. Attention: Focused,Sustained Focused Attention: Appears intact Sustained Attention: Appears intact Memory: Impaired Memory Impairment: Decreased short term memory Awareness: Impaired Awareness Impairment: Intellectual impairment    Extremity Assessment (includes Sensation/Coordination)  Upper Extremity Assessment: Generalized weakness LUE Deficits / Details: pt moves Lt UE actively, but doesn't lift Lt UE  when prompted.  Lt shoulder appears weak. LUE Sensation:  (pt reports no difference in sensation bilaterally) LUE Coordination: decreased gross motor  Lower Extremity Assessment: Defer to PT evaluation LLE Deficits / Details: 4/5 at ankle and 3/5 at knee extensors. multiple cues to complete movements, but then able to hold against some resistance. LLE Sensation:  (pt reports no difference in sensation bilaterally) LLE Coordination: decreased fine motor,decreased gross motor    ADLs  Overall ADL's : Needs assistance/impaired Eating/Feeding: Moderate assistance,Sitting Grooming: Wash/dry hands,Wash/dry face,Oral care,Moderate assistance,Sitting Grooming Details (indicate cue type and reason): Pt self distracts and requires assist to complete tasks. Upper Body Bathing: Sitting,Maximal assistance Lower Body Bathing: Maximal assistance,Sit to/from stand Upper Body Dressing : Maximal assistance,Sitting Lower Body Dressing: Sit to/from stand,Maximal assistance Lower Body Dressing Details (indicate cue type and reason): Pt was instructed to take of her socks.  She required  the request to be repeated 4 times before she could initiate it. She eventually pulled sock half way off, then became distracted by the need to scratch her foot, and was unable to complete the task. Toilet Transfer: Moderate assistance,Stand-pivot,BSC Toilet Transfer Details (indicate cue type and reason): verbal cues and mod A to fully turn to the chair before attempting to sit Toileting- Clothing Manipulation and Hygiene: Maximal assistance,Sit to/from stand Functional mobility during ADLs: Moderate assistance    Mobility  Overal bed mobility: Needs Assistance Bed Mobility: Supine to Sit Rolling: Max assist Supine to sit: Max assist General bed mobility comments: Pt unable to initiate Lt LE movement on her own, requiring my assist to do so.  Pt distracted and unable to sequence task requirng assist to lift trunk .  Transfers  Overall transfer level: Needs assistance Equipment used: 1 person hand held assist Transfers: Stand Pivot Transfers,Sit to/from Stand Sit to Stand: Mod assist Stand pivot transfers: Mod assist General transfer comment: requires assist to initiate activity.  Once assist provided to initiate activity, she was able to turn toward the chair, but became distracted and unable to re initiate the task without physical assistance.  Pt with poorly controlled descent into the chair    Ambulation / Gait / Stairs / Wheelchair Mobility  Ambulation/Gait Ambulation/Gait assistance: Mod assist Gait Distance (Feet): 8 Feet Assistive device: Rolling walker (2 wheeled) Gait Pattern/deviations: Step-to pattern,Decreased stride length,Decreased dorsiflexion - right,Decreased dorsiflexion - left,Shuffle General Gait Details: small steps laterally and forward in room. cues for clearance with BLE, modA with RW management. needs frequent cues for directioning and benefits from tactile assist at times Gait velocity: decreased    Posture / Balance Balance Overall balance assessment:  Needs assistance Sitting-balance support: Single extremity supported,Feet supported Sitting balance-Leahy Scale: Fair Standing balance support: Single extremity supported Standing balance-Leahy Scale: Poor    Special needs/care consideration Diabetic management yes   Previous Home Environment (from acute therapy documentation) Living Arrangements: Other relatives Available Help at Discharge: Family,Available PRN/intermittently Type of Home: House Home Layout: One level Home Access: Level entry Bathroom Shower/Tub: Chiropodist: Standard Additional Comments: Pt's brother, who is blind, lives with her.  Discharge Living Setting Plans for Discharge Living Setting: Patient's home,Lives with (comment) (brother who is deaf and blind) Type of Home at Discharge: House Discharge Home Layout: One level Discharge Home Access: Stairs to enter Entrance Stairs-Rails: None Entrance Stairs-Number of Steps: 1 Discharge Bathroom Shower/Tub: Walk-in shower Discharge Bathroom Toilet: Standard Discharge Bathroom Accessibility: Yes How Accessible: Accessible via walker Does the patient have any problems obtaining your medications?: No  Social/Family/Support Systems Patient Roles: Caregiver Contact Information: daugther Kateri Mc): [phone] Anticipated Caregiver: daughter + older brother, sister in law Anticipated Caregiver's Contact Information: see above Ability/Limitations of Caregiver: Min A Caregiver Availability: 24/7 (daughter plans to take [facility]) Discharge Plan Discussed with Primary Caregiver: Yes Is Caregiver In Agreement with Plan?: Yes Does Caregiver/Family have Issues with Lodging/Transportation while Pt is in Rehab?: No  Goals Patient/Family Goal for Rehab: PT: Supervision; OT: Supervision/Min A; SLP: Supervision Expected length of stay: 10-14 days Pt/Family Agrees to Admission and willing to participate: Yes Program Orientation Provided & Reviewed with  Pt/Caregiver Including Roles  & Responsibilities: Yes  Barriers to Discharge: Home environment access/layout  Barriers to Discharge Comments: one step to enter.  Decrease burden of Care through IP rehab admission: NA  Possible need for SNF placement upon discharge: Not anticipated; pt has good social support at DC and is motivated to return home at DC.   Patient Condition: I have reviewed medical records from [facility], spoken with MD, and patient and daughter. I met with patient at the bedside for inpatient rehabilitation assessment.  Patient will benefit from ongoing PT, OT and SLP, can actively participate in 3 hours of therapy a day 5 days of the week, and can make measurable gains during the admission.  Patient will also benefit from the coordinated team approach during an Inpatient Acute Rehabilitation admission.  The patient will receive intensive therapy as well as Rehabilitation physician, nursing, social worker, and care management interventions.  Due to safety, skin/wound care, disease management, medication administration, pain management and patient education the patient requires 24 hour a day rehabilitation nursing.  The patient is currently Mod A  with mobility and Mod A to Max A for basic ADLs.  Discharge setting and therapy post discharge at home with home health is anticipated.  Patient has agreed to participate in the Acute Inpatient Rehabilitation Program and will admit today.  Preadmission Screen Completed By:  Raechel Ache, 06/25/2020 2:49 PM ______________________________________________________________________   Discussed status with Dr. Ranell Patrick on 06/25/20 at 1:56PM and received approval for admission today.  Admission Coordinator:  Raechel Ache, OT, time 1:56PM/Date 06/25/20   Assessment/Plan: Diagnosis: ICH 1. Does the need for close, 24 hr/day Medical supervision in concert with the patient's rehab needs make it unreasonable for this patient to be served in a  less intensive setting? Yes 2. Co-Morbidities requiring supervision/potential complications:  1. Obesity (BMI 36.48): provide lifestyle education 2. HLD 3. HTN: monitor BP TID 4. Type 2 DM: monitor CBGs AC/HS 5. Cystocele 3. Due to bladder management, bowel management, safety, skin/wound care, disease management, medication administration, pain management and patient education, does the patient require 24 hr/day rehab nursing? Yes 4. Does the patient require coordinated care of a physician, rehab nurse, PT, OT to address physical and functional deficits in the context of the above medical diagnosis(es)? Yes Addressing deficits in the following areas: balance, endurance, locomotion, strength, transferring, bowel/bladder control, bathing, dressing, feeding, grooming, toileting and psychosocial support 5. Can the patient actively participate in an intensive therapy program of at least 3 hrs of therapy 5 days a week? Yes 6. The potential for patient to make measurable gains while on inpatient rehab is excellent 7. Anticipated functional outcomes upon discharge from inpatient rehab: modified independent PT, modified independent OT, independent SLP 8. Estimated rehab length of stay to reach the above functional goals is: 10-14 days 9. Anticipated discharge destination: Home 10. Overall Rehab/Functional Prognosis: excellent   MD Signature: Leeroy Cha, MD

## 2020-06-25 NOTE — Progress Notes (Signed)
Inpatient Rehabilitation  Patient information reviewed and entered into eRehab system by Domingos Riggi M. Jenin Birdsall, M.A., CCC/SLP, PPS Coordinator.  Information including medical coding, functional ability and quality indicators will be reviewed and updated through discharge.    

## 2020-06-25 NOTE — H&P (Signed)
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  Per chart review independent prior to admission.  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  1 level home with level entry.  Presented 06/22/2020 with acute onset of left-sided weakness.  Cranial CT scan showed an area of hemorrhage within the anterior medial right frontal lobe.  Adjacent small amount of blood to the left side of the falx could be within the left frontal lobe or subarachnoid blood.  No mass-effect or midline shift.  CT angiogram of head and neck no sign of high flow vascular malformation in the region of the right frontal hemorrhage.  No signs of venous thrombosis.  MRI of the brain right frontal and smaller left frontal intraparenchymal hemorrhage likely small in size when comparing across modalities.  Small volume of adjacent hemorrhage along the falx and suspected trace right frontal subarachnoid hemorrhage.  No surrounding restricted diffusion to suggest hemorrhagic transformation or infarct.  No visible lesion noted.  Echocardiogram with ejection fraction of 60 to 65% no wall motion abnormalities grade 2 diastolic dysfunction.  Neurology follow-up close monitoring of blood pressure.  Tolerating a regular consistency diet.  Admitted to CIR with left sided weakness, impaired mobility and ADLs, constipation, joint pain, and myalgias.   Review of Systems  Constitutional: Negative for chills and fever.  HENT: Negative for hearing loss.   Eyes: Negative for blurred vision and double vision.  Respiratory: Negative for cough and shortness of breath.   Cardiovascular: Negative for chest pain,  palpitations and leg swelling.  Gastrointestinal: Positive for constipation. Negative for heartburn, nausea and vomiting.  Genitourinary: Negative for dysuria, flank pain and hematuria.  Musculoskeletal: Positive for joint pain and myalgias.  Skin: Negative for rash.  Neurological: Positive for sensory change, weakness and headaches.   Past Medical History:  Diagnosis Date  . Aneurysm ([place]) 06/2015   brain-small will recheck in 1 year  . Cold   . Cystocele 07/12/2015  . Hyperlipidemia   . Hypertension   . Neuropathy of both feet    tingling/numbness bilateral feet ,rt arm,hands-nerve study scheduled  . Osteoarthritis   . Renal disorder    kidney stones  . Seasonal allergies   . Type 2 diabetes mellitus ([place])    Past Surgical History:  Procedure Laterality Date  . CHOLECYSTECTOMY  1999  . COLONOSCOPY N/A 06/15/2014   Procedure: COLONOSCOPY;  Surgeon: Rogene Houston, MD;  [facility]: [facility];  Service: Endoscopy;  Laterality: N/A;  1145  . CYSTOSCOPY WITH RETROGRADE PYELOGRAM, URETEROSCOPY AND STENT PLACEMENT Bilateral 08/20/2015   Procedure: CYSTOSCOPY WITH RETROGRADE PYELOGRAM, URETEROSCOPY AND STENT PLACEMENT WITH STONE EXTRACTION WITH BASKET;  Surgeon: Cleon Gustin, MD;  Location: [facility];  Service: Urology;  Laterality: Bilateral;  . ESOPHAGEAL DILATION N/A 03/23/2015   Procedure: ESOPHAGEAL DILATION;  Surgeon: Rogene Houston, MD;  [facility]: [facility];  Service: Endoscopy;  Laterality: N/A;  . ESOPHAGOGASTRODUODENOSCOPY N/A 03/23/2015   Procedure: ESOPHAGOGASTRODUODENOSCOPY (EGD);  Surgeon: Rogene Houston, MD;  [facility]: [facility];  Service: Endoscopy;  Laterality: N/A;  8:55 - moved to 12:35 - Ann to notify pt  . FOOT SURGERY Bilateral   .  HOLMIUM LASER APPLICATION Bilateral [id number]   Procedure: HOLMIUM LASER APPLICATION;  Surgeon: Cleon Gustin, MD;  Location: [facility];  Service: Urology;  Laterality: Bilateral;  .  VAGINAL HYSTERECTOMY  1984   Family History  Problem Relation Age of Onset  . Hypertension Mother   . Arthritis Mother   . Cancer Mother        breast   . Cerebral palsy Brother   . Early death Brother   . Hypertension Brother   . Heart disease Brother   . Pneumonia Father   . Hypertension Father   . Hyperlipidemia Sister   . Breast cancer Sister   . Fibroids Sister   . Hypertension Brother   . Heart attack Brother    Social History:  reports that she quit smoking about 22 years ago. Her smoking use included cigarettes. She has a 6.00 pack-year smoking history. She has never used smokeless tobacco. She reports previous alcohol use. She reports that she does not use drugs. Allergies:  Allergies  Allergen Reactions  . Codeine Other (See Comments)    Sensitivity, "makes high"  . Prednisone Other (See Comments)    High blood sugar.   Medications Prior to Admission  Medication Sig Dispense Refill  . Albuterol (PROVENTIL IN) Inhale 2 Inhalers into the lungs daily as needed (shortness of breath).    Marland Kitchen albuterol (VENTOLIN HFA) 108 (90 Base) MCG/ACT inhaler 2 puffs as needed    . hydrochlorothiazide (HYDRODIURIL) 25 MG tablet Take 25 mg by mouth daily.  2  . metFORMIN (GLUCOPHAGE) 500 MG tablet Take 1,000 mg by mouth daily with breakfast.     . PENNSAID 2 % SOLN Apply 2 Pump topically in the morning and at bedtime.      Drug Regimen Review Drug regimen was reviewed and remains appropriate with no significant issues identified  Home: Home Living Family/patient expects to be discharged to:: Private residence Living Arrangements: Other relatives Available Help at Discharge: Family,Available PRN/intermittently Type of Home: House Home Access: Level entry Home Layout: One level Bathroom Shower/Tub: Chiropodist: Standard Home Equipment: None Additional Comments: Pt's brother, who is blind, lives with her.   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  Pt distracted and unable to sequence task requirng assist to lift trunk . Transfers Overall transfer level: Needs assistance Equipment used: 1 person hand held assist Transfers: Stand Pivot Transfers,Sit to/from Stand Sit to Stand: Mod assist Stand pivot transfers: Mod assist General transfer comment: requires assist to initiate activity.  Once assist provided to initiate activity, she was able to turn toward the chair, but became distracted and unable to re initiate the task without physical assistance.  Pt with poorly controlled descent into the chair Ambulation/Gait Ambulation/Gait assistance: Mod assist Gait Distance (Feet): 8 Feet Assistive device: Rolling walker (2 wheeled) Gait Pattern/deviations: Step-to pattern,Decreased stride length,Decreased dorsiflexion - right,Decreased dorsiflexion - left,Shuffle General Gait Details: small steps laterally and forward in room. cues for clearance with BLE, modA with RW management. needs frequent cues for directioning and benefits from tactile assist at times Gait velocity: decreased  ADL: ADL Overall  ADL's : Needs assistance/impaired Eating/Feeding: Moderate assistance,Sitting Grooming: Wash/dry hands,Wash/dry face,Oral care,Moderate assistance,Sitting Grooming Details (indicate cue type and reason): Pt self distracts and requires assist to complete tasks. Upper Body Bathing: Sitting,Maximal assistance Lower Body Bathing: Maximal assistance,Sit to/from stand Upper Body Dressing : Maximal assistance,Sitting Lower Body Dressing: Sit to/from  stand,Maximal assistance Lower Body Dressing Details (indicate cue type and reason): Pt was instructed to take of her socks.  She required the request to be repeated 4 times before she could initiate it. She eventually pulled sock half way off, then became distracted by the need to scratch her foot, and was unable to complete the task. Toilet Transfer: Moderate assistance,Stand-pivot,BSC Toilet Transfer Details (indicate cue type and reason): verbal cues and mod A to fully turn to the chair before attempting to sit Toileting- Clothing Manipulation and Hygiene: Maximal assistance,Sit to/from stand Functional mobility during ADLs: Moderate assistance  Cognition: Cognition Overall Cognitive Status: Impaired/Different from baseline Arousal/Alertness: Awake/alert Orientation Level: Oriented X4 Attention: Focused,Sustained Focused Attention: Appears intact Sustained Attention: Appears intact Memory: Impaired Memory Impairment: Decreased short term memory Awareness: Impaired Awareness Impairment: Intellectual impairment Cognition Arousal/Alertness: Awake/alert Behavior During Therapy: Flat affect Overall Cognitive Status: Impaired/Different from baseline Area of Impairment: Attention,Memory,Following commands,Safety/judgement,Awareness,Problem solving Orientation Level: Disoriented to,Place,Time Current Attention Level: Sustained (with up to max cues) Memory: Decreased short-term memory Following Commands: Follows one step commands inconsistently,Follows one step commands with increased time Safety/Judgement: Decreased awareness of deficits,Decreased awareness of safety Awareness: Intellectual Problem Solving: Slow processing,Decreased initiation,Difficulty sequencing,Requires verbal cues,Requires tactile cues General Comments: Pt noted with motor perserveration, and is very distracted.  Upon myentrance on pt's Lt, pt rubbing a water bottle label with her Rt hand and looking to the Rt.  Pt  verbally greeted me.  she answered all questions, but did not attempt to turn head and look to to me on her left.  When asked what color hair I had, she made up a color, but did not initiate lookig to me on her lt.  When cued to look at me, she still was unable to due to being distracted by rubbing the label which was on her Rt.  She eventually did turn to her left, once I moved to her right and instructed her to follow me with her eyes.  She required >4 mins to initiate moving Lt LE off the bed to move to the EOB.  She moved her Rt LE off the bed, but made no attempt to move her Lt LE despite max cues and prompting.  She eventually required max A to initiate this.  Pt attempted to change the channels of her TV, but instead decreased the volume perseverating on pushing the button.  She was unable to recognize her error, or correct her error without max prompting and assist from me.  Physical Exam: Blood pressure 122/77, pulse 73, temperature 98 F (36.7 C), temperature source Oral, resp. rate 17, height 5\' 6"  (1.676 m), weight 101.8 kg, SpO2 98 %. General: Alert, No apparent distress HEENT: Head is normocephalic, atraumatic, PERRLA, EOMI, sclera anicteric, oral mucosa pink and moist, dentition intact, ext ear canals clear,  Neck: Supple without JVD or lymphadenopathy Heart: Reg rate and rhythm. No murmurs rubs or gallops Chest: CTA bilaterally without wheezes, rales, or rhonchi; no distress Abdomen: Soft, non-tender, non-distended, bowel sounds positive. Extremities: No clubbing, cyanosis, or edema. Pulses are 2+ Psych: Pt's affect is appropriate. Pt is cooperative Skin: Clean and intact without signs of breakdown Neuro: Patient is alert in no  acute distress.  Right gaze preference.  She follows simple commands.  Provides her name and age but with poor sentence pacing and some delay in processing.  LUE: 3/5 SA, EF, 4/5 EE, hand grip LLE: 4/5    Results for orders placed or performed during the  hospital encounter of 06/25/20 (from the past 48 hour(s))  Glucose, capillary     Status: None   Collection Time: 06/25/20  5:04 PM  Result Value Ref Range   Glucose-Capillary 84 70 - 99 mg/dL    Comment: Glucose reference range applies only to samples taken after fasting for at least 8 hours.   ECHOCARDIOGRAM COMPLETE  Result Date: 06/24/2020    ECHOCARDIOGRAM REPORT   Patient Name:   Valentina Gu Date of Exam: 06/24/2020 Medical Rec #:  [id number]          Height:       66.0 in Accession #:    [id number]         Weight:       226.0 lb Date of Birth:  [birth year]          BSA:          2.106 m Patient Age:    57 years           BP:           124/74 mmHg Patient Gender: F                  HR:           79 bpm. Exam Location:  Inpatient Procedure: 2D Echo, Cardiac Doppler and Color Doppler Indications:    Stroke  History:        Patient has no prior history of Echocardiogram examinations.                 Risk Factors:Dyslipidemia, Hypertension, Diabetes and Former                 Smoker.  Sonographer:    Clayton Lefort RDCS (AE) Referring Phys: [id number] [place]  1. Left ventricular ejection fraction, by estimation, is 60 to 65%. The left ventricle has normal function. The left ventricle has no regional wall motion abnormalities. There is moderate concentric left ventricular hypertrophy. Left ventricular diastolic parameters are consistent with Grade II diastolic dysfunction (pseudonormalization).  2. Right ventricular systolic function is normal. The right ventricular size is normal.  3. The mitral valve is normal in structure. Mild mitral valve regurgitation. No evidence of mitral stenosis.  4. The aortic valve is normal in structure. Aortic valve regurgitation is not visualized. No aortic stenosis is present.  5. The inferior vena cava is normal in size with greater than 50% respiratory variability, suggesting right atrial pressure of 3 mmHg. Conclusion(s)/Recommendation(s): No intracardiac  source of embolism detected on this transthoracic study. A transesophageal echocardiogram is recommended to exclude cardiac source of embolism if clinically indicated. FINDINGS  Left Ventricle: Left ventricular ejection fraction, by estimation, is 60 to 65%. The left ventricle has normal function. The left ventricle has no regional wall motion abnormalities. The left ventricular internal cavity size was normal in size. There is  moderate concentric left ventricular hypertrophy. Left ventricular diastolic parameters are consistent with Grade II diastolic dysfunction (pseudonormalization). Right Ventricle: The right ventricular size is normal. No increase in right ventricular wall thickness. Right ventricular systolic function is normal. Left Atrium: Left atrial size was normal in size. Right Atrium: Right atrial size was normal  in size. Pericardium: There is no evidence of pericardial effusion. Mitral Valve: The mitral valve is normal in structure. Mild mitral valve regurgitation. No evidence of mitral valve stenosis. MV peak gradient, 3.9 mmHg. The mean mitral valve gradient is 1.0 mmHg. Tricuspid Valve: The tricuspid valve is normal in structure. Tricuspid valve regurgitation is not demonstrated. No evidence of tricuspid stenosis. Aortic Valve: The aortic valve is normal in structure. Aortic valve regurgitation is not visualized. No aortic stenosis is present. Aortic valve mean gradient measures 4.0 mmHg. Aortic valve peak gradient measures 6.9 mmHg. Aortic valve area, by VTI measures 2.45 cm. Pulmonic Valve: The pulmonic valve was normal in structure. Pulmonic valve regurgitation is not visualized. No evidence of pulmonic stenosis. Aorta: The aortic root is normal in size and structure. Venous: The inferior vena cava is normal in size with greater than 50% respiratory variability, suggesting right atrial pressure of 3 mmHg. IAS/Shunts: No atrial level shunt detected by color flow Doppler.  LEFT VENTRICLE PLAX 2D  LVIDd:         4.20 cm  Diastology LVIDs:         2.70 cm  LV e' medial:    6.74 cm/s LV PW:         1.30 cm  LV E/e' medial:  9.0 LV IVS:        1.40 cm  LV e' lateral:   9.03 cm/s LVOT diam:     2.00 cm  LV E/e' lateral: 6.7 LV SV:         63 LV SV Index:   30 LVOT Area:     3.14 cm  RIGHT VENTRICLE            IVC RV Basal diam:  2.70 cm    IVC diam: 2.30 cm RV S prime:     9.30 cm/s TAPSE (M-mode): 1.9 cm LEFT ATRIUM             Index       RIGHT ATRIUM           Index LA diam:        3.90 cm 1.85 cm/m  RA Area:     14.70 cm LA Vol (A2C):   60.2 ml 28.58 ml/m RA Volume:   36.90 ml  17.52 ml/m LA Vol (A4C):   54.8 ml 26.02 ml/m LA Biplane Vol: 61.9 ml 29.39 ml/m  AORTIC VALVE AV Area (Vmax):    2.21 cm AV Area (Vmean):   1.94 cm AV Area (VTI):     2.45 cm AV Vmax:           131.00 cm/s AV Vmean:          92.600 cm/s AV VTI:            0.258 m AV Peak Grad:      6.9 mmHg AV Mean Grad:      4.0 mmHg LVOT Vmax:         92.00 cm/s LVOT Vmean:        57.300 cm/s LVOT VTI:          0.201 m LVOT/AV VTI ratio: 0.78  AORTA Ao Root diam: 3.30 cm Ao Asc diam:  3.10 cm MITRAL VALVE MV Area (PHT): 2.69 cm    SHUNTS MV Area VTI:   2.76 cm    Systemic VTI:  0.20 m MV Peak grad:  3.9 mmHg    Systemic Diam: 2.00 cm MV Mean grad:  1.0 mmHg MV Vmax:  0.99 m/s MV Vmean:      48.0 cm/s MV Decel Time: 282 msec MV E velocity: 60.80 cm/s MV A velocity: 75.40 cm/s MV E/A ratio:  0.81 Ena Dawley MD Electronically signed by Ena Dawley MD Signature Date/Time: 06/24/2020/3:46:30 PM    Final        Medical Problem List and Plan: 1.  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  -patient may shower  -ELOS/Goals: modI 10-14 days 2.  Antithrombotics: -DVT/anticoagulation: SCDs  -antiplatelet therapy: N/A 3. Pain Management: Tylenol as needed 4. Mood: Provide emotional  support  -antipsychotic agents: N/A 5. Neuropsych: This patient is capable of making decisions on her own behalf. 6. Skin/Wound Care: Routine skin checks 7. Fluids/Electrolytes/Nutrition: Routine in and outs with follow-up chemistries 8.  Diabetes mellitus.  CBGs 98-125. Hemoglobin A1c 6.2.  Continue Glucophage 1000 mg daily.   9.  Hypertension.  Well controlled, continue HCTZ 25 mg daily 10.  History of kidney stones with stent placement 2017 11. Obesity (BMI 36.48): Provide dietary counseling 12. Hypokalemic to 3.3 on 2/7, repeat tomorrow  Cathlyn Parsons, PA-C 06/25/2020   I have personally performed a face to face diagnostic evaluation, including, but not limited to relevant history and physical exam findings, of this patient and developed relevant assessment and plan.  Additionally, I have reviewed and concur with the physician assistant's documentation above.  Leeroy Cha, MD

## 2020-06-25 NOTE — TOC Transition Note (Signed)
Transition of Care [facility]) - CM/SW Discharge Note   Patient Details  Name: Cheryl Ortiz MRN: [id number] Date of Birth: 04-24-1951  [facility] CM/SW Contact:  Pollie Friar, RN Phone Number: 06/25/2020, 2:14 PM   Clinical Narrative:    Pt is discharging to CIR today. CM signing off.   Final next level of care: IP Rehab Facility Barriers to Discharge: No Barriers Identified   Patient Goals and CMS Choice     Choice offered to / list presented to : Patient  Discharge Placement                       Discharge Plan and Services                                     Social Determinants of Health (SDOH) Interventions     Readmission Risk Interventions No flowsheet data found.

## 2020-06-25 NOTE — Progress Notes (Deleted)
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  On imaging she showed to have nontraumatic right frontal ICH.  Subsequent MRI with and without showed stable right frontal ICH and no CAA pattern or underlying mass lesion.   Subjective / 24h Interval events: -no complaints this morning, no nausea/vomiting. No chest pain.  Assessment & Plan: Principal Problem ICH, right frontal lobe ICH, unclear etiology -Neurology consulted, appreciate input.  She underwent stroke work-up, 2D echo done 2/6 showed EF 60-65%, normal LVEF, no WMA, moderate concentric LVH and grade 2 diastolic dysfunction.  RV is normal.  LDL 66, A1c 6.2, UDS negative. -Currently on SCDs, no antithrombotics due to [place] -Physical therapy evaluated patient, recommended CIR, placement pending  Active Problems Essential hypertension -She is normotensive today, continue labetalol as needed, goal systolic less than 841  Type 2 diabetes mellitus -A1c 6.2, continue sliding scale.  At home she is on Metformin.  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  Also has a history of EtOH in the past.  Hypokalemia -Replete and monitor  Scheduled Meds: .  stroke: mapping our early stages of recovery book   Does not apply Once  . Chlorhexidine Gluconate Cloth  6 each Topical Daily  . insulin aspart  0-15 Units Subcutaneous TID WC  . insulin aspart  4 Units Subcutaneous TID WC  . oxyCODONE  5 mg Oral Once  . pantoprazole  40 mg Oral Daily  . senna-docusate  1 tablet Oral BID   Continuous Infusions: PRN Meds:.acetaminophen **OR** acetaminophen (TYLENOL) oral liquid 160 mg/5  mL **OR** acetaminophen, labetalol  Diet Orders (From admission, onward)    Start     Ordered   06/23/20 0718  Diet Carb Modified Fluid consistency: Thin; Room service appropriate? Yes with Assist  Diet effective now       Question Answer Comment  Diet-HS Snack? Nothing   Calorie Level Medium 1600-2000   Fluid consistency: Thin   Room service appropriate? Yes with Assist      06/23/20 0718          DVT prophylaxis: SCD's Start: 06/23/20 0510     Code Status: Full Code  Family Communication: no family at bedside  Status is: Inpatient  Remains inpatient appropriate because:Inpatient level of care appropriate due to severity of illness   Dispo: The patient is from: Home              Anticipated d/c is to: CIR              Anticipated d/c date is: 2 days              Patient currently is not medically stable to d/c.   Difficult to place patient No  Level of care: Telemetry Medical  Consultants:  Neurology   Procedures:  2D echo  Microbiology  None   Antimicrobials: None     Objective: Vitals:   06/25/20 0200 06/25/20 0400 06/25/20 0500 06/25/20 0600  BP:  (!) 110/55 115/62 (!) 126/54  Pulse: 68 75 75 68  Resp: 20 18 15  (!) 22  Temp:  97.6 F (36.4 C)    TempSrc:  Oral    SpO2: 95% 93% 95% 98%  Weight:      Height:        Intake/Output Summary (Last 24 hours) at 06/25/2020 5809 Last data filed at 06/25/2020 0600 Gross per 24 hour  Intake 100 ml  Output 1625 ml  Net -1525 ml   Filed Weights   06/22/20 1955  Weight: 102.5 kg    Examination:  Constitutional: NAD Eyes: no scleral icterus ENMT: Mucous membranes are moist.  Neck: normal, supple Respiratory: clear to auscultation bilaterally, no wheezing, no crackles. Normal respiratory effort. No accessory muscle use.  Cardiovascular: Regular rate and rhythm, no murmurs / rubs / gallops. No LE edema. Abdomen: non distended, no tenderness. Bowel sounds positive.  Musculoskeletal: no clubbing /  cyanosis.  Skin: no rashes Neurologic: Left upper and lower extremity weakness. Otherwise intact  Data Reviewed: I have independently reviewed following labs and imaging studies   CBC: Recent Labs  Lab 06/22/20 1918 06/23/20 1430 06/24/20 0301 06/25/20 0114  WBC 9.9 6.9 6.8 8.1  NEUTROABS 7.9*  --   --   --   HGB 14.0 13.1 12.3 12.3  HCT 44.6 40.6 37.3 36.8  MCV 92.3 91.2 89.9 88.9  PLT 335 285 274 983   Basic Metabolic Panel: Recent Labs  Lab 06/22/20 1918 06/23/20 0607 06/23/20 1430 06/24/20 0301 06/25/20 0114  NA 137 139 136 139 137  K 2.9*  --  3.2* 3.6 3.3*  CL 101  --  102 106 104  CO2 25  --  22 23 23   GLUCOSE 160*  --  158* 131* 116*  BUN 21  --  16 16 15   CREATININE 1.12*  --  0.97 0.96 0.95  CALCIUM 9.9  --  9.2 9.2 9.2   Liver Function Tests: No results for input(s): AST, ALT, ALKPHOS, BILITOT, PROT, ALBUMIN in the last 168 hours. Coagulation Profile: Recent Labs  Lab 06/23/20 0231  INR 1.0   HbA1C: Recent Labs    06/23/20 0607  HGBA1C 6.2*   CBG: Recent Labs  Lab 06/24/20 1202 06/24/20 1741 06/24/20 1935 06/25/20 0019 06/25/20 0455  GLUCAP 102* 112* 104* 98 99    Recent Results (from the past 240 hour(s))  SARS Coronavirus 2 by RT PCR (hospital order, performed in [facility] hospital lab) Nasopharyngeal Nasopharyngeal Swab     Status: None   Collection Time: 06/22/20  8:29 PM   Specimen: Nasopharyngeal Swab  Result Value Ref Range Status   SARS Coronavirus 2 NEGATIVE NEGATIVE Final    Comment: (NOTE) SARS-CoV-2 target nucleic acids are NOT DETECTED.  The SARS-CoV-2 RNA is generally detectable in upper and lower respiratory specimens during the acute phase of infection. The lowest concentration of SARS-CoV-2 viral copies this assay can detect is 250 copies / mL. A negative result does not preclude SARS-CoV-2 infection and should not be used as the sole basis for treatment or other patient management decisions.  A negative result  may occur with improper specimen collection / handling, submission of specimen other than nasopharyngeal swab, presence of viral mutation(s) within the areas targeted by this assay, and inadequate number of viral copies (<250 copies / mL). A negative result must be combined with clinical observations, patient history, and epidemiological information.  Fact Sheet for Patients:   StrictlyIdeas.no  Fact Sheet for Healthcare Providers: BankingDealers.co.za  This test is not yet approved or  cleared by the [place] FDA and has been  authorized for detection and/or diagnosis of SARS-CoV-2 by FDA under an Emergency Use Authorization (EUA).  This EUA will remain in effect (meaning this test can be used) for the duration of the COVID-19 declaration under Section 564(b)(1) of the Act, 21 U.S.C. section 360bbb-3(b)(1), unless the authorization is terminated or revoked sooner.  Performed at [facility], [address] [place], [place] [address]   MRSA PCR Screening     Status: None   Collection Time: 06/23/20  5:21 AM   Specimen: Nasal Mucosa; Nasopharyngeal  Result Value Ref Range Status   MRSA by PCR NEGATIVE NEGATIVE Final    Comment:        The GeneXpert MRSA Assay (FDA approved for NASAL specimens only), is one component of a comprehensive MRSA colonization surveillance program. It is not intended to diagnose MRSA infection nor to guide or monitor treatment for MRSA infections. Performed at [facility], [place] [address] [place], [place] [address]      Radiology Studies: ECHOCARDIOGRAM COMPLETE  Result Date: 06/24/2020    ECHOCARDIOGRAM REPORT   Patient Name:   Valentina Gu Date of Exam: 06/24/2020 Medical Rec #:  [id number]          Height:       66.0 in Accession #:    [id number]         Weight:       226.0 lb Date of Birth:  01-Oct-1950          BSA:          2.106 m Patient Age:    84 years           BP:            124/74 mmHg Patient Gender: F                  HR:           79 bpm. Exam Location:  Inpatient Procedure: 2D Echo, Cardiac Doppler and Color Doppler Indications:    Stroke  History:        Patient has no prior history of Echocardiogram examinations.                 Risk Factors:Dyslipidemia, Hypertension, Diabetes and Former                 Smoker.  Sonographer:    Clayton Lefort RDCS (AE) Referring Phys: [id number] [place]  1. Left ventricular ejection fraction, by estimation, is 60 to 65%. The left ventricle has normal function. The left ventricle has no regional wall motion abnormalities. There is moderate concentric left ventricular hypertrophy. Left ventricular diastolic parameters are consistent with Grade II diastolic dysfunction (pseudonormalization).  2. Right ventricular systolic function is normal. The right ventricular size is normal.  3. The mitral valve is normal in structure. Mild mitral valve regurgitation. No evidence of mitral stenosis.  4. The aortic valve is normal in structure. Aortic valve regurgitation is not visualized. No aortic stenosis is present.  5. The inferior vena cava is normal in size with greater than 50% respiratory variability, suggesting right atrial pressure of 3 mmHg. Conclusion(s)/Recommendation(s): No intracardiac source of embolism detected on this transthoracic study. A transesophageal echocardiogram is recommended to exclude cardiac source of embolism if clinically indicated. FINDINGS  Left Ventricle: Left ventricular ejection fraction, by estimation, is 60 to 65%. The left ventricle has normal function. The left ventricle has no regional wall motion abnormalities. The left ventricular internal cavity  size was normal in size. There is  moderate concentric left ventricular hypertrophy. Left ventricular diastolic parameters are consistent with Grade II diastolic dysfunction (pseudonormalization). Right Ventricle: The right ventricular size is normal. No increase in  right ventricular wall thickness. Right ventricular systolic function is normal. Left Atrium: Left atrial size was normal in size. Right Atrium: Right atrial size was normal in size. Pericardium: There is no evidence of pericardial effusion. Mitral Valve: The mitral valve is normal in structure. Mild mitral valve regurgitation. No evidence of mitral valve stenosis. MV peak gradient, 3.9 mmHg. The mean mitral valve gradient is 1.0 mmHg. Tricuspid Valve: The tricuspid valve is normal in structure. Tricuspid valve regurgitation is not demonstrated. No evidence of tricuspid stenosis. Aortic Valve: The aortic valve is normal in structure. Aortic valve regurgitation is not visualized. No aortic stenosis is present. Aortic valve mean gradient measures 4.0 mmHg. Aortic valve peak gradient measures 6.9 mmHg. Aortic valve area, by VTI measures 2.45 cm. Pulmonic Valve: The pulmonic valve was normal in structure. Pulmonic valve regurgitation is not visualized. No evidence of pulmonic stenosis. Aorta: The aortic root is normal in size and structure. Venous: The inferior vena cava is normal in size with greater than 50% respiratory variability, suggesting right atrial pressure of 3 mmHg. IAS/Shunts: No atrial level shunt detected by color flow Doppler.  LEFT VENTRICLE PLAX 2D LVIDd:         4.20 cm  Diastology LVIDs:         2.70 cm  LV e' medial:    6.74 cm/s LV PW:         1.30 cm  LV E/e' medial:  9.0 LV IVS:        1.40 cm  LV e' lateral:   9.03 cm/s LVOT diam:     2.00 cm  LV E/e' lateral: 6.7 LV SV:         63 LV SV Index:   30 LVOT Area:     3.14 cm  RIGHT VENTRICLE            IVC RV Basal diam:  2.70 cm    IVC diam: 2.30 cm RV S prime:     9.30 cm/s TAPSE (M-mode): 1.9 cm LEFT ATRIUM             Index       RIGHT ATRIUM           Index LA diam:        3.90 cm 1.85 cm/m  RA Area:     14.70 cm LA Vol (A2C):   60.2 ml 28.58 ml/m RA Volume:   36.90 ml  17.52 ml/m LA Vol (A4C):   54.8 ml 26.02 ml/m LA Biplane Vol: 61.9  ml 29.39 ml/m  AORTIC VALVE AV Area (Vmax):    2.21 cm AV Area (Vmean):   1.94 cm AV Area (VTI):     2.45 cm AV Vmax:           131.00 cm/s AV Vmean:          92.600 cm/s AV VTI:            0.258 m AV Peak Grad:      6.9 mmHg AV Mean Grad:      4.0 mmHg LVOT Vmax:         92.00 cm/s LVOT Vmean:        57.300 cm/s LVOT VTI:          0.201 m LVOT/AV  VTI ratio: 0.78  AORTA Ao Root diam: 3.30 cm Ao Asc diam:  3.10 cm MITRAL VALVE MV Area (PHT): 2.69 cm    SHUNTS MV Area VTI:   2.76 cm    Systemic VTI:  0.20 m MV Peak grad:  3.9 mmHg    Systemic Diam: 2.00 cm MV Mean grad:  1.0 mmHg MV Vmax:       0.99 m/s MV Vmean:      48.0 cm/s MV Decel Time: 282 msec MV E velocity: 60.80 cm/s MV A velocity: 75.40 cm/s MV E/A ratio:  0.81 Ena Dawley MD Electronically signed by Ena Dawley MD Signature Date/Time: 06/24/2020/3:46:30 PM    Final    Marzetta Board, MD, PhD [facility]  Between 7 am - 7 pm I am available, please contact me via Amion or Securechat  Between 7 pm - 7 am I am not available, please contact night coverage MD/APP via Amion

## 2020-06-25 NOTE — Discharge Summary (Signed)
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  On imaging she showed to have nontraumatic right frontal ICH.  Subsequent MRI with and without showed stable right frontal ICH and no CAA pattern or underlying mass lesion.  Hospital Course / Discharge diagnoses: Principal Problem ICH, right frontal lobe ICH, unclear etiology -Neurology consulted, appreciate input.  She underwent stroke work-up, 2D echo done 2/6 showed EF 60-65%, normal LVEF, no WMA, moderate concentric LVH and grade 2 diastolic dysfunction.  RV is normal.  LDL 66, A1c 6.2, UDS negative. Currently on SCDs, no antithrombotics due to [place]. Physical therapy evaluated patient, recommended CIR  Active Problems Essential hypertension -goal systolic less than 194 Type 2 diabetes mellitus -A1c 6.2, continue sliding scale.  At home she is on Metformin. 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  Also has a history of EtOH in the past. Hypokalemia -Replete and monitor  Sepsis ruled out   Discharge Instructions   Allergies as of 06/25/2020      Reactions   Codeine Other (See Comments)   Sensitivity, "makes high"   Prednisone Other (See Comments)   High blood sugar.      Medication List    STOP taking these medications   aspirin 81 MG tablet   EXCEDRIN EXTRA STRENGTH PO   LORazepam 1 MG tablet Commonly known as: Ativan   UNABLE TO FIND   VITAMIN D PO     TAKE these medications   albuterol 108 (90 Base) MCG/ACT inhaler Commonly known as: VENTOLIN HFA 2 puffs as needed   hydrochlorothiazide 25 MG  tablet Commonly known as: HYDRODIURIL Take 25 mg by mouth daily.   metFORMIN 500 MG tablet Commonly known as: GLUCOPHAGE Take 1,000 mg by mouth daily with breakfast.   [place]aid 2 % Soln Generic drug: Diclofenac Sodium Apply 2 Pump topically in the morning and at bedtime.   PROVENTIL IN Inhale 2 Inhalers into the lungs daily as needed (shortness of breath).        Consultations:  Neurology   Procedures/Studies:  CT Angio Head W or Wo Contrast  Result Date: 06/23/2020 CLINICAL DATA:  Right frontal hemorrhage. EXAM: CT ANGIOGRAPHY HEAD TECHNIQUE: Multidetector CT imaging of the head was performed using the standard protocol during bolus administration of intravenous contrast. Multiplanar CT image reconstructions and MIPs were obtained to evaluate the vascular anatomy. CONTRAST:  176mL OMNIPAQUE IOHEXOL 350 MG/ML SOLN COMPARISON:  1 hour ago. FINDINGS: CT HEAD Brain: No change. Intraparenchymal hemorrhage in the right frontal lobe with mild surrounding edema. No additional bleeding. The remainder the brain appears normal except for some chronic small vessel type ischemic changes in the basal ganglia and external capsule regions. No hydrocephalus. No extra-axial collection. Vascular: See results below. Skull: Negative Sinuses: Clear Orbits: Normal CTA HEAD Anterior circulation: Both internal carotid arteries are patent through the skull base and siphon regions. There is ordinary siphon atherosclerotic calcification but no stenosis. The anterior and middle cerebral vessels are patent. No proximal stenosis. No large or medium vessel occlusion. No sign of high flow vascular malformation in the region of the right frontal  hemorrhage. Posterior circulation: Both vertebral arteries widely patent to the basilar. No basilar stenosis. Posterior circulation branch vessels are normal. Venous sinuses: Patent and normal. No sign of venous thrombosis. Cortical venous thrombosis can be difficult to see.  Anatomic variants: None IMPRESSION: 1. No sign of high flow vascular malformation in the region of the right frontal hemorrhage. No sign of venous thrombosis. Cortical venous thrombosis can be difficult to see by CT. 2. No change in the appearance of the right frontal lobe intraparenchymal hemorrhage with mild surrounding edema. No mass effect or shift. Electronically Signed   By: Nelson Chimes M.D.   On: 06/23/2020 03:37   CT Head Wo Contrast  Result Date: 06/23/2020 CLINICAL DATA:  Altered mental status EXAM: CT HEAD WITHOUT CONTRAST TECHNIQUE: Contiguous axial images were obtained from the base of the skull through the vertex without intravenous contrast. COMPARISON:  09/26/2009 FINDINGS: Brain: Area of intracerebral hemorrhage seen within the anteromedial right frontal lobe measures 5.8 x 4.3 x 2.6 cm. Small amount of blood is seen to the left of the falx which could be within the left frontal lobe or subarachnoid blood in a left frontal sulcus. No mass effect or midline shift. No hydrocephalus. Small old right cerebellar infarct. Vascular: No hyperdense vessel or unexpected calcification. Skull: No acute calvarial abnormality. Sinuses/Orbits: Visualized paranasal sinuses and mastoids clear. Orbital soft tissues unremarkable. Other: None IMPRESSION: Area of hemorrhage within the anteromedial right frontal lobe. Adjacent small amount of blood to the left side of the falx could be within the left frontal lobe or subarachnoid blood. No mass effect or midline shift. Critical Value/emergent results were called by telephone at the time of interpretation on 06/23/2020 at 2:10 am to provider Dr. Waverly Ferrari, Who verbally acknowledged these results. Electronically Signed   By: Rolm Baptise M.D.   On: 06/23/2020 02:12   CT Angio Neck W and/or Wo Contrast  Result Date: 06/23/2020 CLINICAL DATA:  Right frontal hemorrhage EXAM: CT ANGIOGRAPHY NECK TECHNIQUE: Multidetector CT imaging of the neck was performed using the standard  protocol during bolus administration of intravenous contrast. Multiplanar CT image reconstructions and MIPs were obtained to evaluate the vascular anatomy. Carotid stenosis measurements (when applicable) are obtained utilizing NASCET criteria, using the distal internal carotid diameter as the denominator. CONTRAST:  184mL OMNIPAQUE IOHEXOL 350 MG/ML SOLN COMPARISON:  1 hour ago FINDINGS: Aortic arch: Aortic atherosclerosis. Right carotid system: Common carotid artery widely patent to the bifurcation. Minimal calcified plaque at the ICA bulb but no stenosis. Cervical ICA widely patent. Left carotid system: Common carotid artery widely patent to the bifurcation. No plaque at the bifurcation or ICA bulb. Cervical ICA widely patent. Vertebral arteries: Both vertebral artery origins are patent. Both vertebral arteries appear normal through the cervical region to the foramen magnum. Skeleton: Ordinary cervical spondylosis. Other neck: No neck mass or lymphadenopathy. Upper chest: Negative IMPRESSION: 1. Minimal calcified plaque at the right ICA bulb but no stenosis. 2. No left carotid bifurcation disease. 3. Both vertebral arteries appear normal through the cervical region to the foramen magnum. 4. Aortic atherosclerosis. Aortic Atherosclerosis (ICD10-I70.0). Electronically Signed   By: Nelson Chimes M.D.   On: 06/23/2020 03:33   MR BRAIN W WO CONTRAST  Result Date: 06/23/2020 CLINICAL DATA:  Intracranial hemorrhage. EXAM: MRI HEAD WITHOUT AND WITH CONTRAST TECHNIQUE: Multiplanar, multiecho pulse sequences of the brain and surrounding structures were obtained without and with intravenous contrast. CONTRAST:  22mL GADAVIST GADOBUTROL 1 MMOL/ML IV SOLN COMPARISON:  CT imaging from  the same day FINDINGS: Brain: Redemonstrated intraparenchymal hemorrhage in the right frontal lobe with smaller hemorrhage in the left frontal lobe and small amount of hemorrhage along the anterior falx. The size of the hemorrhage appears similar  when comparing across modalities. There may be a small amount of nearby right frontal subarachnoid hemorrhage. There is no surrounding restricted diffusion to suggest hemorrhagic transformation of infarct. No visible mass lesion, although acute blood products limit evaluation. There is wispy enhancement in the left frontal lobe which is not masslike. The adjacent superior sagittal sinus appears patent. Mild surrounding edema. Minimal leftward midline shift at the site of hemorrhage, similar to prior. Remote right cerebellar lacunar infarct. No hydrocephalus. Basal cisterns are patent. Additional scattered T2/FLAIR hyperintensities within the white matter, most likely related to chronic microvascular ischemic disease. Vascular: Major arterial flow voids are maintained at the skull base. Skull and upper cervical spine: Normal marrow signal. Sinuses/Orbits: Sinuses are largely clear.  Unremarkable orbits. Other: Trace right mastoid effusion. IMPRESSION: 1. Right frontal (and smaller left frontal) intraparenchymal hemorrhages, likely similar in size when comparing across modalities. Small volume of adjacent hemorrhage along the falx and suspected trace right frontal subarachnoid hemorrhage. No surrounding restricted diffusion to suggest hemorrhagic transformation of infarct. No visible mass lesion, although acute blood products limit evaluation. A follow-up MRI after resolution of hemorrhage could further evaluate for underlying abnormality. 2. 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 Electronically Signed   By: Margaretha Sheffield MD   On: 06/23/2020 13:28   ECHOCARDIOGRAM COMPLETE  Result Date: 06/24/2020    ECHOCARDIOGRAM REPORT   Patient Name:   Valentina Gu Date of Exam: 06/24/2020 Medical Rec #:  [id number]          Height:       66.0 in Accession #:    [id number]         Weight:       226.0 lb Date of Birth:  [birth year]          BSA:          2.106 m Patient Age:     50 years           BP:           124/74 mmHg Patient Gender: F                  HR:           79 bpm. Exam Location:  Inpatient Procedure: 2D Echo, Cardiac Doppler and Color Doppler Indications:    Stroke  History:        Patient has no prior history of Echocardiogram examinations.                 Risk Factors:Dyslipidemia, Hypertension, Diabetes and Former                 Smoker.  Sonographer:    Clayton Lefort RDCS (AE) Referring Phys: [id number] [place]  1. Left ventricular ejection fraction, by estimation, is 60 to 65%. The left ventricle has normal function. The left ventricle has no regional wall motion abnormalities. There is moderate concentric left ventricular hypertrophy. Left ventricular diastolic parameters are consistent with Grade II diastolic dysfunction (pseudonormalization).  2. Right ventricular systolic function is normal. The right ventricular size is normal.  3. The mitral valve is normal in structure. Mild mitral valve regurgitation. No evidence of mitral stenosis.  4. The aortic valve is normal in structure. Aortic valve  regurgitation is not visualized. No aortic stenosis is present.  5. The inferior vena cava is normal in size with greater than 50% respiratory variability, suggesting right atrial pressure of 3 mmHg. Conclusion(s)/Recommendation(s): No intracardiac source of embolism detected on this transthoracic study. A transesophageal echocardiogram is recommended to exclude cardiac source of embolism if clinically indicated. FINDINGS  Left Ventricle: Left ventricular ejection fraction, by estimation, is 60 to 65%. The left ventricle has normal function. The left ventricle has no regional wall motion abnormalities. The left ventricular internal cavity size was normal in size. There is  moderate concentric left ventricular hypertrophy. Left ventricular diastolic parameters are consistent with Grade II diastolic dysfunction (pseudonormalization). Right Ventricle: The right  ventricular size is normal. No increase in right ventricular wall thickness. Right ventricular systolic function is normal. Left Atrium: Left atrial size was normal in size. Right Atrium: Right atrial size was normal in size. Pericardium: There is no evidence of pericardial effusion. Mitral Valve: The mitral valve is normal in structure. Mild mitral valve regurgitation. No evidence of mitral valve stenosis. MV peak gradient, 3.9 mmHg. The mean mitral valve gradient is 1.0 mmHg. Tricuspid Valve: The tricuspid valve is normal in structure. Tricuspid valve regurgitation is not demonstrated. No evidence of tricuspid stenosis. Aortic Valve: The aortic valve is normal in structure. Aortic valve regurgitation is not visualized. No aortic stenosis is present. Aortic valve mean gradient measures 4.0 mmHg. Aortic valve peak gradient measures 6.9 mmHg. Aortic valve area, by VTI measures 2.45 cm. Pulmonic Valve: The pulmonic valve was normal in structure. Pulmonic valve regurgitation is not visualized. No evidence of pulmonic stenosis. Aorta: The aortic root is normal in size and structure. Venous: The inferior vena cava is normal in size with greater than 50% respiratory variability, suggesting right atrial pressure of 3 mmHg. IAS/Shunts: No atrial level shunt detected by color flow Doppler.  LEFT VENTRICLE PLAX 2D LVIDd:         4.20 cm  Diastology LVIDs:         2.70 cm  LV e' medial:    6.74 cm/s LV PW:         1.30 cm  LV E/e' medial:  9.0 LV IVS:        1.40 cm  LV e' lateral:   9.03 cm/s LVOT diam:     2.00 cm  LV E/e' lateral: 6.7 LV SV:         63 LV SV Index:   30 LVOT Area:     3.14 cm  RIGHT VENTRICLE            IVC RV Basal diam:  2.70 cm    IVC diam: 2.30 cm RV S prime:     9.30 cm/s TAPSE (M-mode): 1.9 cm LEFT ATRIUM             Index       RIGHT ATRIUM           Index LA diam:        3.90 cm 1.85 cm/m  RA Area:     14.70 cm LA Vol (A2C):   60.2 ml 28.58 ml/m RA Volume:   36.90 ml  17.52 ml/m LA Vol (A4C):    54.8 ml 26.02 ml/m LA Biplane Vol: 61.9 ml 29.39 ml/m  AORTIC VALVE AV Area (Vmax):    2.21 cm AV Area (Vmean):   1.94 cm AV Area (VTI):     2.45 cm AV Vmax:  131.00 cm/s AV Vmean:          92.600 cm/s AV VTI:            0.258 m AV Peak Grad:      6.9 mmHg AV Mean Grad:      4.0 mmHg LVOT Vmax:         92.00 cm/s LVOT Vmean:        57.300 cm/s LVOT VTI:          0.201 m LVOT/AV VTI ratio: 0.78  AORTA Ao Root diam: 3.30 cm Ao Asc diam:  3.10 cm MITRAL VALVE MV Area (PHT): 2.69 cm    SHUNTS MV Area VTI:   2.76 cm    Systemic VTI:  0.20 m MV Peak grad:  3.9 mmHg    Systemic Diam: 2.00 cm MV Mean grad:  1.0 mmHg MV Vmax:       0.99 m/s MV Vmean:      48.0 cm/s MV Decel Time: 282 msec MV E velocity: 60.80 cm/s MV A velocity: 75.40 cm/s MV E/A ratio:  0.81 Ena Dawley MD Electronically signed by Ena Dawley MD Signature Date/Time: 06/24/2020/3:46:30 PM    Final    CT VENOGRAM HEAD  Result Date: 06/23/2020 CLINICAL DATA:  Right frontal hemorrhage EXAM: CT VENOGRAM HEAD TECHNIQUE: Venography technique subsequently utilized. CONTRAST:  1104mL OMNIPAQUE IOHEXOL 350 MG/ML SOLN COMPARISON:  Arterial exam. FINDINGS: No visible venous thrombosis either of the sinuses or superficial veins. Superficial venous thrombosis can be difficult to see by CT. IMPRESSION: No visible venous thrombosis. Superficial venous thrombosis can be difficult to see by CT. Electronically Signed   By: Nelson Chimes M.D.   On: 06/23/2020 03:38      Subjective: - no chest pain, shortness of breath, no abdominal pain, nausea or vomiting.   Discharge Exam: BP 125/72 (BP Location: Left Arm)   Pulse 78   Temp 98.4 F (36.9 C) (Oral)   Resp 20   Ht 5\' 6"  (1.676 m)   Wt 102.5 kg   SpO2 95%   BMI 36.48 kg/m   General: Pt is alert, awake, not in acute distress Cardiovascular: RRR, S1/S2 +, no rubs, no gallops Respiratory: CTA bilaterally, no wheezing, no rhonchi Abdominal: Soft, NT, ND, bowel sounds + Extremities: no  edema, no cyanosis    The results of significant diagnostics from this hospitalization (including imaging, microbiology, ancillary and laboratory) are listed below for reference.     Microbiology: Recent Results (from the past 240 hour(s))  SARS Coronavirus 2 by RT PCR (hospital order, performed in [facility] hospital lab) Nasopharyngeal Nasopharyngeal Swab     Status: None   Collection Time: 06/22/20  8:29 PM   Specimen: Nasopharyngeal Swab  Result Value Ref Range Status   SARS Coronavirus 2 NEGATIVE NEGATIVE Final    Comment: (NOTE) SARS-CoV-2 target nucleic acids are NOT DETECTED.  The SARS-CoV-2 RNA is generally detectable in upper and lower respiratory specimens during the acute phase of infection. The lowest concentration of SARS-CoV-2 viral copies this assay can detect is 250 copies / mL. A negative result does not preclude SARS-CoV-2 infection and should not be used as the sole basis for treatment or other patient management decisions.  A negative result may occur with improper specimen collection / handling, submission of specimen other than nasopharyngeal swab, presence of viral mutation(s) within the areas targeted by this assay, and inadequate number of viral copies (<250 copies / mL). A negative result must be combined with  clinical observations, patient history, and epidemiological information.  Fact Sheet for Patients:   StrictlyIdeas.no  Fact Sheet for Healthcare Providers: BankingDealers.co.za  This test is not yet approved or  cleared by the [place] FDA and has been authorized for detection and/or diagnosis of SARS-CoV-2 by FDA under an Emergency Use Authorization (EUA).  This EUA will remain in effect (meaning this test can be used) for the duration of the COVID-19 declaration under Section 564(b)(1) of the Act, 21 U.S.C. section 360bbb-3(b)(1), unless the authorization is terminated or revoked  sooner.  Performed at [facility], [address], [place], [place] [address]   MRSA PCR Screening     Status: None   Collection Time: 06/23/20  5:21 AM   Specimen: Nasal Mucosa; Nasopharyngeal  Result Value Ref Range Status   MRSA by PCR NEGATIVE NEGATIVE Final    Comment:        The GeneXpert MRSA Assay (FDA approved for NASAL specimens only), is one component of a comprehensive MRSA colonization surveillance program. It is not intended to diagnose MRSA infection nor to guide or monitor treatment for MRSA infections. Performed at [facility], [place] [address] [place], [place] [address]      Labs: Basic Metabolic Panel: Recent Labs  Lab 06/22/20 1918 06/23/20 0607 06/23/20 1430 06/24/20 0301 06/25/20 0114  NA 137 139 136 139 137  K 2.9*  --  3.2* 3.6 3.3*  CL 101  --  102 106 104  CO2 25  --  22 23 23   GLUCOSE 160*  --  158* 131* 116*  BUN 21  --  16 16 15   CREATININE 1.12*  --  0.97 0.96 0.95  CALCIUM 9.9  --  9.2 9.2 9.2   Liver Function Tests: No results for input(s): AST, ALT, ALKPHOS, BILITOT, PROT, ALBUMIN in the last 168 hours. CBC: Recent Labs  Lab 06/22/20 1918 06/23/20 1430 06/24/20 0301 06/25/20 0114  WBC 9.9 6.9 6.8 8.1  NEUTROABS 7.9*  --   --   --   HGB 14.0 13.1 12.3 12.3  HCT 44.6 40.6 37.3 36.8  MCV 92.3 91.2 89.9 88.9  PLT 335 285 274 273   CBG: Recent Labs  Lab 06/24/20 1935 06/25/20 0019 06/25/20 0455 06/25/20 0809 06/25/20 1203  GLUCAP 104* 98 99 125* 116*   Hgb A1c Recent Labs    06/23/20 0607  HGBA1C 6.2*   Lipid Profile Recent Labs    06/23/20 0607  CHOL 133  HDL 46  LDLCALC 66  TRIG 103  CHOLHDL 2.9   Thyroid function studies No results for input(s): TSH, T4TOTAL, T3FREE, THYROIDAB in the last 72 hours.  Invalid input(s): FREET3 Urinalysis    Component Value Date/Time   COLORURINE YELLOW 09/27/2019 [place] 09/27/2019 1209   LABSPEC 1.015 09/27/2019 1209   PHURINE 6.0  09/27/2019 1209   GLUCOSEU NEGATIVE 09/27/2019 1209   [place] 09/27/2019 [place] 09/27/2019 1209   [place] 09/27/2019 1209   PROTEINUR NEGATIVE 09/27/2019 1209   UROBILINOGEN 0.2 08/12/2013 1948   NITRITE NEGATIVE 09/27/2019 1209   LEUKOCYTESUR NEGATIVE 09/27/2019 1209    FURTHER DISCHARGE INSTRUCTIONS:   Get Medicines reviewed and adjusted: Please take all your medications with you for your next visit with your Primary MD   Laboratory/radiological data: Please request your Primary MD to go over all hospital tests and procedure/radiological results at the follow up, please ask your Primary MD to get all Hospital records  sent to his/her office.   In some cases, they will be blood work, cultures and biopsy results pending at the time of your discharge. Please request that your primary care M.D. goes through all the records of your hospital data and follows up on these results.   Also Note the following: If you experience worsening of your admission symptoms, develop shortness of breath, life threatening emergency, suicidal or homicidal thoughts you must seek medical attention immediately by calling 911 or calling your MD immediately  if symptoms less severe.   You must read complete instructions/literature along with all the possible adverse reactions/side effects for all the Medicines you take and that have been prescribed to you. Take any new Medicines after you have completely understood and accpet all the possible adverse reactions/side effects.    Do not drive when taking Pain medications or sleeping medications (Benzodaizepines)   Do not take more than prescribed Pain, Sleep and Anxiety Medications. It is not advisable to combine anxiety,sleep and pain medications without talking with your primary care practitioner   Special Instructions: If you have smoked or chewed Tobacco  in the last 2 yrs please stop smoking, stop any regular Alcohol  and  or any Recreational drug use.   Wear Seat belts while driving.   Please note: You were cared for by a hospitalist during your hospital stay. Once you are discharged, your primary care physician will handle any further medical issues. Please note that NO REFILLS for any discharge medications will be authorized once you are discharged, as it is imperative that you return to your primary care physician (or establish a relationship with a primary care physician if you do not have one) for your post hospital discharge needs so that they can reassess your need for medications and monitor your lab values.  Time coordinating discharge: 35 minutes  SIGNED:  Marzetta Board, MD, PhD 06/25/2020, 1:43 PM

## 2020-06-25 NOTE — Progress Notes (Signed)
Patient arrived on the unit in chair, A&O x4, assessment done. She denied pain or discomfort. Patient educated on safety and POC. Call bell within reach and bed in lowest position. We continue to monitor.

## 2020-06-25 NOTE — Progress Notes (Signed)
STROKE TEAM PROGRESS NOTE   INTERVAL HISTORY No family is at the bedside.  Pt sitting in bed, playing cell phone. She is more awake alert and interactive today. Flat effect much improved. No HA and no acute event overnight. Pending CIR.    OBJECTIVE Vitals:   06/25/20 0800 06/25/20 0900 06/25/20 1016 06/25/20 1208  BP: 126/68 (!) 132/57 133/60 125/72  Pulse: 69 79 71 78  Resp: 20 (!) 23 20 20   Temp: (!) 97.3 F (36.3 C)  98.4 F (36.9 C) 98.4 F (36.9 C)  TempSrc: Oral  Oral Oral  SpO2: 96% 93% 99% 95%  Weight:      Height:        CBC:  Recent Labs  Lab 06/22/20 1918 06/23/20 1430 06/24/20 0301 06/25/20 0114  WBC 9.9   < > 6.8 8.1  NEUTROABS 7.9*  --   --   --   HGB 14.0   < > 12.3 12.3  HCT 44.6   < > 37.3 36.8  MCV 92.3   < > 89.9 88.9  PLT 335   < > 274 273   < > = values in this interval not displayed.    Basic Metabolic Panel:  Recent Labs  Lab 06/24/20 0301 06/25/20 0114  NA 139 137  K 3.6 3.3*  CL 106 104  CO2 23 23  GLUCOSE 131* 116*  BUN 16 15  CREATININE 0.96 0.95  CALCIUM 9.2 9.2    Lipid Panel:     Component Value Date/Time   CHOL 133 06/23/2020 0607   TRIG 103 06/23/2020 0607   HDL 46 06/23/2020 0607   CHOLHDL 2.9 06/23/2020 0607   VLDL 21 06/23/2020 0607   LDLCALC 66 06/23/2020 0607   HgbA1c:  Lab Results  Component Value Date   HGBA1C 6.2 (H) 06/23/2020   Urine Drug Screen:     Component Value Date/Time   LABOPIA NONE DETECTED 06/23/2020 0936   COCAINSCRNUR NONE DETECTED 06/23/2020 0936   LABBENZ NONE DETECTED 06/23/2020 0936   AMPHETMU NONE DETECTED 06/23/2020 0936   THCU NONE DETECTED 06/23/2020 0936   LABBARB NONE DETECTED 06/23/2020 0936    Alcohol Level     Component Value Date/Time   ETH <10 09/27/2019 1108    IMAGING  CT Angio Head W or Wo Contrast  06/23/2020 IMPRESSION:  1. No sign of high flow vascular malformation in the region of the right frontal hemorrhage. No sign of venous thrombosis. Cortical venous  thrombosis can be difficult to see by CT.  2. No change in the appearance of the right frontal lobe intraparenchymal hemorrhage with mild surrounding edema. No mass effect or shift.   CT Head Wo Contrast 06/23/2020 IMPRESSION:  Area of hemorrhage within the anteromedial right frontal lobe. Adjacent small amount of blood to the left side of the falx could be within the left frontal lobe or subarachnoid blood. No mass effect or midline shift.  CT Angio Neck W and/or Wo Contrast 06/23/2020 IMPRESSION:  1. Minimal calcified plaque at the right ICA bulb but no stenosis.  2. No left carotid bifurcation disease.  3. Both vertebral arteries appear normal through the cervical region to the foramen magnum.  4. Aortic atherosclerosis. Aortic Atherosclerosis (ICD10-I70.0).   CT VENOGRAM HEAD 06/23/2020 IMPRESSION:  No visible venous thrombosis. Superficial venous thrombosis can be difficult to see by CT.   MR BRAIN W WO CONTRAST 06/23/2020 IMPRESSION:  1. Right frontal (and smaller left frontal) intraparenchymal hemorrhages, likely similar in size  when comparing across modalities. Small volume of adjacent hemorrhage along the falx and suspected trace right frontal subarachnoid hemorrhage. No surrounding restricted diffusion to suggest hemorrhagic transformation of infarct. No visible mass lesion, although acute blood products limit evaluation. A follow-up MRI after resolution of hemorrhage could further evaluate for underlying abnormality.  2. 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    ECG - SR rate 70 BPM with PACs (See cardiology reading for complete details)   PHYSICAL EXAM Temp:  [97.3 F (36.3 C)-98.4 F (36.9 C)] 98 F (36.7 C) (02/07 1605) Pulse Rate:  [63-87] 73 (02/07 1605) Resp:  [15-23] 17 (02/07 1605) BP: (93-133)/(40-77) 122/77 (02/07 1605) SpO2:  [93 %-99 %] 98 % (02/07 1605) Weight:  [101.8 kg] 101.8 kg (02/07 1605)  General - Well  nourished, well developed, in no apparent distress.  Ophthalmologic - fundi not visualized due to noncooperation.  Cardiovascular - Regular rhythm and rate.  Mental Status -  Level of arousal and orientation to time, place, and person were intact. Language including expression, naming, repetition, comprehension was assessed and found intact. Fund of Knowledge was assessed and was intact.  Cranial Nerves II - XII - II - Visual field intact OU. III, IV, VI - Extraocular movements intact.   V - Facial sensation intact bilaterally. VII - slight left facial droop. VIII - Hearing & vestibular intact bilaterally. X - Palate elevates symmetrically. XI - Chin turning & shoulder shrug intact bilaterally. XII - Tongue protrusion intact.  Motor Strength - The patient's strength was normal in right upper and lower extremities, however, left UE 3+/5 proximal and 4/5 bicep tricep and 4+/5 finger grip with left pronator drift, left LE proximal and distal 4/5.  Bulk was normal and fasciculations were absent.   Motor Tone - Muscle tone was assessed at the neck and appendages and was normal.  Reflexes - The patient's reflexes were symmetrical in all extremities and she had no pathological reflexes.  Sensory - Light touch, temperature/pinprick were assessed and were symmetrical.    Coordination - The patient had normal movements in the hands with no ataxia or dysmetria.  Tremor was absent.  Gait and Station - deferred.    ASSESSMENT/PLAN Cheryl Ortiz is a 71 y.o. 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 Found to have a R frontal lobe ICH while on ASA 81 mg daily.  She did not receive IV t-PA due to [place].  ICH - right frontal lobe ICH, etiology unclear  CT head - Area of hemorrhage within the anteromedial right frontal lobe. Adjacent small amount of blood to the left  side of the falx could be within the left frontal lobe or subarachnoid blood. No mass effect or midline shift.  CTA Head and neck - unremarkable.   CT Venogram Head - No visible venous thrombosis.   MRI head W/WO - Right frontal (and smaller left frontal) intraparenchymal hemorrhages, likely similar in size when comparing across modalities.  Recommend to repeat MRI with and without after hematoma resolves  2D Echo - EF 60-65%  [employer] Virus 2 - negative  LDL - 66  HgbA1c - 6.2  UDS neg  VTE prophylaxis - SCDs  aspirin 81 mg daily prior to admission, now on No antithrombotic due to [place]  Ongoing aggressive stroke risk factor management  Therapy recommendations:  CIR  Disposition:  Pending  Hypertension  Home BP meds: HCTZ  Current BP meds: Labetalol prn  Stable . SBP goal < 160 mm Hg. . Long-term BP goal normotensive  Diabetes  Home diabetic meds: metformin  HgbA1c 6.2, goal < 7.0  SSI  CBG monitoring  Close PCP follow-up   Other Stroke Risk Factors  Advanced age  Former cigarette smoker - quit > 22 years ago  Previous ETOH use  Obesity, Body mass index is 36.48 kg/m., recommend weight loss, diet and exercise as appropriate   Other Active Problems   Code status - Full code Hypokalemia - potassium - 2.9->3.2->3.6 Aortic Atherosclerosis (ICD10-I70.0).   Hospital day # 2  Neurology will sign off. Please call with questions. Pt will follow up with Dr. Jannifer Franklin at [facility] in about 4 weeks. Thanks for the consult.   Rosalin Hawking, MD PhD Stroke Neurology 06/25/2020 6:21 PM   To contact Stroke Continuity provider, please refer to [email or web address] After hours, contact General Neurology

## 2020-06-25 NOTE — Progress Notes (Signed)
Inpatient Rehabilitation-Admissions Coordinator   CIR consult received. Met with pt and her daughter bedside for CIR assessment. Discussed recommended program, expectations for participation, expected length of stay, and anticipated functional outcomes. Confirmed DC support with pt's daughter. Feel pt is a great CIR candidate. Both the patient and her daughter are in favor of this program.   Received medical clearance from Dr. Cruzita Lederer to admit to CIR today. Notified pt and her daughter of semi-private room bed offer; they have accepted.   AC will admit pt to semi-private CIR bed today. RN and [facility] team updated on plan.   Please call if questions.   Raechel Ache, OTR/L  Rehab Admissions Coordinator  [phone] 06/25/2020 2:54 PM

## 2020-06-25 NOTE — H&P (Signed)
Physical Medicine and Rehabilitation Admission H&P    Chief Complaint  Patient presents with  . 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  Per chart review independent prior to admission.  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  1 level home with level entry.  Presented 06/22/2020 with acute onset of left-sided weakness.  Cranial CT scan showed an area of hemorrhage within the anterior medial right frontal lobe.  Adjacent small amount of blood to the left side of the falx could be within the left frontal lobe or subarachnoid blood.  No mass-effect or midline shift.  CT angiogram of head and neck no sign of high flow vascular malformation in the region of the right frontal hemorrhage.  No signs of venous thrombosis.  MRI of the brain right frontal and smaller left frontal intraparenchymal hemorrhage likely small in size when comparing across modalities.  Small volume of adjacent hemorrhage along the falx and suspected trace right frontal subarachnoid hemorrhage.  No surrounding restricted diffusion to suggest hemorrhagic transformation or infarct.  No visible lesion noted.  Echocardiogram with ejection fraction of 60 to 65% no wall motion abnormalities grade 2 diastolic dysfunction.  Neurology follow-up close monitoring of blood pressure.  Tolerating a regular consistency diet.  Admitted to CIR with left sided weakness, impaired mobility and ADLs, constipation, joint pain, and myalgias.   Review of Systems  Constitutional: Negative for chills and fever.  HENT: Negative for hearing loss.   Eyes: Negative for blurred vision and double vision.  Respiratory: Negative for cough and shortness  of breath.   Cardiovascular: Negative for chest pain, palpitations and leg swelling.  Gastrointestinal: Positive for constipation. Negative for heartburn, nausea and vomiting.  Genitourinary: Negative for dysuria, flank pain and hematuria.  Musculoskeletal: Positive for joint pain and myalgias.  Skin: Negative for rash.  Neurological: Positive for sensory change, weakness and headaches.   Past Medical History:  Diagnosis Date  . Aneurysm ([place]) 06/2015   brain-small will recheck in 1 year  . Cold   . Cystocele 07/12/2015  . Hyperlipidemia   . Hypertension   . Neuropathy of both feet    tingling/numbness bilateral feet ,rt arm,hands-nerve study scheduled  . Osteoarthritis   . Renal disorder    kidney stones  . Seasonal allergies   . Type 2 diabetes mellitus ([place])    Past Surgical History:  Procedure Laterality Date  . CHOLECYSTECTOMY  1999  . COLONOSCOPY N/A 06/15/2014   Procedure: COLONOSCOPY;  Surgeon: Rogene Houston, MD;  [facility];  Service: Endoscopy;  Laterality: N/A;  1145  . CYSTOSCOPY WITH RETROGRADE PYELOGRAM, URETEROSCOPY AND STENT PLACEMENT Bilateral 08/20/2015   Procedure: CYSTOSCOPY WITH RETROGRADE PYELOGRAM, URETEROSCOPY AND STENT PLACEMENT WITH STONE EXTRACTION WITH BASKET;  Surgeon: Cleon Gustin, MD;  Location: [facility];  Service: Urology;  Laterality: Bilateral;  . ESOPHAGEAL DILATION N/A 03/23/2015   Procedure: ESOPHAGEAL DILATION;  Surgeon: Rogene Houston, MD;  [facility];  Service: Endoscopy;  Laterality: N/A;  . ESOPHAGOGASTRODUODENOSCOPY N/A 03/23/2015   Procedure: ESOPHAGOGASTRODUODENOSCOPY (EGD);  Surgeon: Rogene Houston, MD;  [facility];  Service: Endoscopy;  Laterality: N/A;  8:55 - moved to  12:35 - Ann to notify pt  . FOOT SURGERY Bilateral   . HOLMIUM LASER APPLICATION Bilateral [id number]   Procedure: HOLMIUM LASER APPLICATION;  Surgeon: Cleon Gustin, MD;  Location: [facility];  Service: Urology;  Laterality: Bilateral;  . VAGINAL HYSTERECTOMY  1984   Family History  Problem Relation Age of Onset  . Hypertension Mother   . Arthritis Mother   . Cancer Mother        breast   . Cerebral palsy Brother   . Early death Brother   . Hypertension Brother   . Heart disease Brother   . Pneumonia Father   . Hypertension Father   . Hyperlipidemia Sister   . Breast cancer Sister   . Fibroids Sister   . Hypertension Brother   . Heart attack Brother    Social History:  reports that she quit smoking about 22 years ago. Her smoking use included cigarettes. She has a 6.00 pack-year smoking history. She has never used smokeless tobacco. She reports previous alcohol use. She reports that she does not use drugs. Allergies:  Allergies  Allergen Reactions  . Codeine Other (See Comments)    Sensitivity, "makes high"  . Prednisone Other (See Comments)    High blood sugar.   Medications Prior to Admission  Medication Sig Dispense Refill  . Albuterol (PROVENTIL IN) Inhale 2 Inhalers into the lungs daily as needed (shortness of breath).    Marland Kitchen aspirin 81 MG tablet Take 81 mg by mouth daily.    . Aspirin-Acetaminophen-Caffeine (EXCEDRIN EXTRA STRENGTH PO) Take 2 tablets by mouth daily as needed (headache).    . hydrochlorothiazide (HYDRODIURIL) 25 MG tablet Take 25 mg by mouth daily.  2  . metFORMIN (GLUCOPHAGE) 500 MG tablet Take 1,000 mg by mouth daily with breakfast.     . PENNSAID 2 % SOLN Apply 2 Pump topically in the morning and at bedtime.    Marland Kitchen albuterol (VENTOLIN HFA) 108 (90 Base) MCG/ACT inhaler 2 puffs as needed    . LORazepam (ATIVAN) 1 MG tablet Take 1 tablet (1 mg total) by mouth 3 (three) times daily as needed for anxiety. (Patient not taking: No sig reported) 15 tablet 0  . UNABLE TO FIND Herbal supplement-daily (Patient not taking: No sig reported)    . VITAMIN D PO Take 50,000 mg by mouth once a week. (Patient not taking: No sig reported)      Drug  Regimen Review Drug regimen was reviewed and remains appropriate with no significant issues identified  Home: Home Living Family/patient expects to be discharged to:: Private residence Living Arrangements: Other relatives Available Help at Discharge: Family,Available PRN/intermittently Type of Home: House Home Access: Level entry Home Layout: One level Bathroom Shower/Tub: Chiropodist: Standard Home Equipment: None Additional Comments: Pt's brother, who is blind, lives with her.   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  Pt distracted and unable to sequence task requirng assist to lift trunk . Transfers Overall transfer level: Needs assistance Equipment used: 1 person hand held assist Transfers: Stand Pivot Transfers,Sit to/from Stand Sit to Stand: Mod assist Stand pivot  transfers: Mod assist General transfer comment: requires assist to initiate activity.  Once assist provided to initiate activity, she was able to turn toward the chair, but became distracted and unable to re initiate the task without physical assistance.  Pt with poorly controlled descent into the chair Ambulation/Gait Ambulation/Gait assistance: Mod assist Gait Distance (Feet): 8 Feet Assistive device: Rolling walker (2 wheeled) Gait Pattern/deviations: Step-to pattern,Decreased stride length,Decreased dorsiflexion - right,Decreased dorsiflexion - left,Shuffle General Gait Details: small steps laterally and forward in room. cues for clearance with BLE, modA with RW management. needs frequent  cues for directioning and benefits from tactile assist at times Gait velocity: decreased    ADL: ADL Overall ADL's : Needs assistance/impaired Eating/Feeding: Moderate assistance,Sitting Grooming: Wash/dry hands,Wash/dry face,Oral care,Moderate assistance,Sitting Grooming Details (indicate cue type and reason): Pt self distracts and requires assist to complete tasks. Upper Body Bathing: Sitting,Maximal assistance Lower Body Bathing: Maximal assistance,Sit to/from stand Upper Body Dressing : Maximal assistance,Sitting Lower Body Dressing: Sit to/from stand,Maximal assistance Lower Body Dressing Details (indicate cue type and reason): Pt was instructed to take of her socks.  She required the request to be repeated 4 times before she could initiate it. She eventually pulled sock half way off, then became distracted by the need to scratch her foot, and was unable to complete the task. Toilet Transfer: Moderate assistance,Stand-pivot,BSC Toilet Transfer Details (indicate cue type and reason): verbal cues and mod A to fully turn to the chair before attempting to sit Toileting- Clothing Manipulation and Hygiene: Maximal assistance,Sit to/from stand Functional mobility during ADLs: Moderate assistance  Cognition: Cognition Overall Cognitive Status: Impaired/Different from baseline Arousal/Alertness: Awake/alert Orientation Level: Oriented X4 Attention: Focused,Sustained Focused Attention: Appears intact Sustained Attention: Appears intact Memory: Impaired Memory Impairment: Decreased short term memory Awareness: Impaired Awareness Impairment: Intellectual impairment Cognition Arousal/Alertness: Awake/alert Behavior During Therapy: Flat affect Overall Cognitive Status: Impaired/Different from baseline Area of Impairment: Attention,Memory,Following commands,Safety/judgement,Awareness,Problem solving Orientation Level: Disoriented to,Place,Time Current Attention Level: Sustained (with up  to max cues) Memory: Decreased short-term memory Following Commands: Follows one step commands inconsistently,Follows one step commands with increased time Safety/Judgement: Decreased awareness of deficits,Decreased awareness of safety Awareness: Intellectual Problem Solving: Slow processing,Decreased initiation,Difficulty sequencing,Requires verbal cues,Requires tactile cues General Comments: Pt noted with motor perserveration, and is very distracted.  Upon myentrance on pt's Lt, pt rubbing a water bottle label with her Rt hand and looking to the Rt.  Pt verbally greeted me.  she answered all questions, but did not attempt to turn head and look to to me on her left.  When asked what color hair I had, she made up a color, but did not initiate lookig to me on her lt.  When cued to look at me, she still was unable to due to being distracted by rubbing the label which was on her Rt.  She eventually did turn to her left, once I moved to her right and instructed her to follow me with her eyes.  She required >4 mins to initiate moving Lt LE off the bed to move to the EOB.  She moved her Rt LE off the bed, but made no attempt to move her Lt LE despite max cues and prompting.  She eventually required max A to initiate this.  Pt attempted to change the channels of her TV, but instead decreased the volume perseverating on pushing the button.  She was unable to recognize her error, or correct her error without max prompting and assist from me.  Physical Exam: Blood  pressure 125/72, pulse 78, temperature 98.4 F (36.9 C), temperature source Oral, resp. rate 20, height 5\' 6"  (1.676 m), weight 102.5 kg, SpO2 95 %. General: Alert, No apparent distress HEENT: Head is normocephalic, atraumatic, PERRLA, EOMI, sclera anicteric, oral mucosa pink and moist, dentition intact, ext ear canals clear,  Neck: Supple without JVD or lymphadenopathy Heart: Reg rate and rhythm. No murmurs rubs or gallops Chest: CTA bilaterally  without wheezes, rales, or rhonchi; no distress Abdomen: Soft, non-tender, non-distended, bowel sounds positive. Extremities: No clubbing, cyanosis, or edema. Pulses are 2+ Psych: Pt's affect is appropriate. Pt is cooperative Skin: Clean and intact without signs of breakdown Neuro: Patient is alert in no acute distress.  Right gaze preference.  She follows simple commands.  Provides her name and age but with poor sentence pacing and some delay in processing.  LUE: 3/5 SA, EF, 4/5 EE, hand grip LLE: 4/5    Results for orders placed or performed during the hospital encounter of 06/22/20 (from the past 48 hour(s))  Basic metabolic panel     Status: Abnormal   Collection Time: 06/23/20  2:30 PM  Result Value Ref Range   Sodium 136 135 - 145 mmol/L   Potassium 3.2 (L) 3.5 - 5.1 mmol/L   Chloride 102 98 - 111 mmol/L   CO2 22 22 - 32 mmol/L   Glucose, Bld 158 (H) 70 - 99 mg/dL    Comment: Glucose reference range applies only to samples taken after fasting for at least 8 hours.   BUN 16 8 - 23 mg/dL   Creatinine, Ser 0.97 0.44 - 1.00 mg/dL   Calcium 9.2 8.9 - 10.3 mg/dL   GFR, Estimated >60 >60 mL/min    Comment: (NOTE) Calculated using the CKD-EPI Creatinine Equation (2021)    Anion gap 12 5 - 15    Comment: Performed at [facility] [address], [place], [place] [address]  CBC     Status: None   Collection Time: 06/23/20  2:30 PM  Result Value Ref Range   WBC 6.9 4.0 - 10.5 K/uL   RBC 4.45 3.87 - 5.11 MIL/uL   Hemoglobin 13.1 12.0 - 15.0 g/dL   HCT 40.6 36.0 - 46.0 %   MCV 91.2 80.0 - 100.0 fL   MCH 29.4 26.0 - 34.0 pg   MCHC 32.3 30.0 - 36.0 g/dL   RDW 13.5 11.5 - 15.5 %   Platelets 285 150 - 400 K/uL   nRBC 0.0 0.0 - 0.2 %    Comment: Performed at [facility], [place] [address] [place], [place] [address]  Glucose, capillary     Status: Abnormal   Collection Time: 06/23/20  4:33 PM  Result Value Ref Range   Glucose-Capillary 115 (H) 70 - 99 mg/dL    Comment:  Glucose reference range applies only to samples taken after fasting for at least 8 hours.  Glucose, capillary     Status: Abnormal   Collection Time: 06/23/20 10:01 PM  Result Value Ref Range   Glucose-Capillary 135 (H) 70 - 99 mg/dL    Comment: Glucose reference range applies only to samples taken after fasting for at least 8 hours.  Basic metabolic panel     Status: Abnormal   Collection Time: 06/24/20  3:01 AM  Result Value Ref Range   Sodium 139 135 - 145 mmol/L   Potassium 3.6 3.5 - 5.1 mmol/L   Chloride 106 98 - 111 mmol/L   CO2 23 22 -  32 mmol/L   Glucose, Bld 131 (H) 70 - 99 mg/dL    Comment: Glucose reference range applies only to samples taken after fasting for at least 8 hours.   BUN 16 8 - 23 mg/dL   Creatinine, Ser 0.96 0.44 - 1.00 mg/dL   Calcium 9.2 8.9 - 10.3 mg/dL   GFR, Estimated >60 >60 mL/min    Comment: (NOTE) Calculated using the CKD-EPI Creatinine Equation (2021)    Anion gap 10 5 - 15    Comment: Performed at [facility] [address] [place], [place] [address]  CBC     Status: None   Collection Time: 06/24/20  3:01 AM  Result Value Ref Range   WBC 6.8 4.0 - 10.5 K/uL   RBC 4.15 3.87 - 5.11 MIL/uL   Hemoglobin 12.3 12.0 - 15.0 g/dL   HCT 37.3 36.0 - 46.0 %   MCV 89.9 80.0 - 100.0 fL   MCH 29.6 26.0 - 34.0 pg   MCHC 33.0 30.0 - 36.0 g/dL   RDW 13.6 11.5 - 15.5 %   Platelets 274 150 - 400 K/uL   nRBC 0.0 0.0 - 0.2 %    Comment: Performed at [facility], [place] [address] [place], [place] [address]  Glucose, capillary     Status: Abnormal   Collection Time: 06/24/20  8:43 AM  Result Value Ref Range   Glucose-Capillary 152 (H) 70 - 99 mg/dL    Comment: Glucose reference range applies only to samples taken after fasting for at least 8 hours.  Glucose, capillary     Status: Abnormal   Collection Time: 06/24/20 12:02 PM  Result Value Ref Range   Glucose-Capillary 102 (H) 70 - 99 mg/dL    Comment: Glucose reference range applies only to  samples taken after fasting for at least 8 hours.  Glucose, capillary     Status: Abnormal   Collection Time: 06/24/20  5:41 PM  Result Value Ref Range   Glucose-Capillary 112 (H) 70 - 99 mg/dL    Comment: Glucose reference range applies only to samples taken after fasting for at least 8 hours.  Glucose, capillary     Status: Abnormal   Collection Time: 06/24/20  7:35 PM  Result Value Ref Range   Glucose-Capillary 104 (H) 70 - 99 mg/dL    Comment: Glucose reference range applies only to samples taken after fasting for at least 8 hours.  Glucose, capillary     Status: None   Collection Time: 06/25/20 12:19 AM  Result Value Ref Range   Glucose-Capillary 98 70 - 99 mg/dL    Comment: Glucose reference range applies only to samples taken after fasting for at least 8 hours.  Basic metabolic panel     Status: Abnormal   Collection Time: 06/25/20  1:14 AM  Result Value Ref Range   Sodium 137 135 - 145 mmol/L   Potassium 3.3 (L) 3.5 - 5.1 mmol/L   Chloride 104 98 - 111 mmol/L   CO2 23 22 - 32 mmol/L   Glucose, Bld 116 (H) 70 - 99 mg/dL    Comment: Glucose reference range applies only to samples taken after fasting for at least 8 hours.   BUN 15 8 - 23 mg/dL   Creatinine, Ser 0.95 0.44 - 1.00 mg/dL   Calcium 9.2 8.9 - 10.3 mg/dL   GFR, Estimated >60 >60 mL/min    Comment: (NOTE) Calculated using the CKD-EPI Creatinine Equation (2021)    Anion  gap 10 5 - 15    Comment: Performed at [facility], [place] [address] [place], [place] [address]  CBC     Status: None   Collection Time: 06/25/20  1:14 AM  Result Value Ref Range   WBC 8.1 4.0 - 10.5 K/uL   RBC 4.14 3.87 - 5.11 MIL/uL   Hemoglobin 12.3 12.0 - 15.0 g/dL   HCT 36.8 36.0 - 46.0 %   MCV 88.9 80.0 - 100.0 fL   MCH 29.7 26.0 - 34.0 pg   MCHC 33.4 30.0 - 36.0 g/dL   RDW 13.5 11.5 - 15.5 %   Platelets 273 150 - 400 K/uL   nRBC 0.0 0.0 - 0.2 %    Comment: Performed at [facility], [place] [address], [place], [place]  [address]  Glucose, capillary     Status: None   Collection Time: 06/25/20  4:55 AM  Result Value Ref Range   Glucose-Capillary 99 70 - 99 mg/dL    Comment: Glucose reference range applies only to samples taken after fasting for at least 8 hours.  Glucose, capillary     Status: Abnormal   Collection Time: 06/25/20  8:09 AM  Result Value Ref Range   Glucose-Capillary 125 (H) 70 - 99 mg/dL    Comment: Glucose reference range applies only to samples taken after fasting for at least 8 hours.  Glucose, capillary     Status: Abnormal   Collection Time: 06/25/20 12:03 PM  Result Value Ref Range   Glucose-Capillary 116 (H) 70 - 99 mg/dL    Comment: Glucose reference range applies only to samples taken after fasting for at least 8 hours.   Comment 1 Notify RN    Comment 2 Document in Chart    ECHOCARDIOGRAM COMPLETE  Result Date: 06/24/2020    ECHOCARDIOGRAM REPORT   Patient Name:   Cheryl Ortiz Date of Exam: 06/24/2020 Medical Rec #:  [id number]          Height:       66.0 in Accession #:    [id number]         Weight:       226.0 lb Date of Birth:  [birth year]          BSA:          2.106 m Patient Age:    89 years           BP:           124/74 mmHg Patient Gender: F                  HR:           79 bpm. Exam Location:  Inpatient Procedure: 2D Echo, Cardiac Doppler and Color Doppler Indications:    Stroke  History:        Patient has no prior history of Echocardiogram examinations.                 Risk Factors:Dyslipidemia, Hypertension, Diabetes and Former                 Smoker.  Sonographer:    Clayton Lefort RDCS (AE) Referring Phys: [id number] [place]  1. Left ventricular ejection fraction, by estimation, is 60 to 65%. The left ventricle has normal function. The left ventricle has no regional wall motion abnormalities. There is moderate concentric left ventricular hypertrophy. Left ventricular diastolic parameters are consistent with Grade II diastolic dysfunction (pseudonormalization).  2.  Right ventricular systolic function is normal. The right ventricular size is normal.  3. The mitral valve is normal in structure. Mild mitral valve regurgitation. No evidence of mitral stenosis.  4. The aortic valve is normal in structure. Aortic valve regurgitation is not visualized. No aortic stenosis is present.  5. The inferior vena cava is normal in size with greater than 50% respiratory variability, suggesting right atrial pressure of 3 mmHg. Conclusion(s)/Recommendation(s): No intracardiac source of embolism detected on this transthoracic study. A transesophageal echocardiogram is recommended to exclude cardiac source of embolism if clinically indicated. FINDINGS  Left Ventricle: Left ventricular ejection fraction, by estimation, is 60 to 65%. The left ventricle has normal function. The left ventricle has no regional wall motion abnormalities. The left ventricular internal cavity size was normal in size. There is  moderate concentric left ventricular hypertrophy. Left ventricular diastolic parameters are consistent with Grade II diastolic dysfunction (pseudonormalization). Right Ventricle: The right ventricular size is normal. No increase in right ventricular wall thickness. Right ventricular systolic function is normal. Left Atrium: Left atrial size was normal in size. Right Atrium: Right atrial size was normal in size. Pericardium: There is no evidence of pericardial effusion. Mitral Valve: The mitral valve is normal in structure. Mild mitral valve regurgitation. No evidence of mitral valve stenosis. MV peak gradient, 3.9 mmHg. The mean mitral valve gradient is 1.0 mmHg. Tricuspid Valve: The tricuspid valve is normal in structure. Tricuspid valve regurgitation is not demonstrated. No evidence of tricuspid stenosis. Aortic Valve: The aortic valve is normal in structure. Aortic valve regurgitation is not visualized. No aortic stenosis is present. Aortic valve mean gradient measures 4.0 mmHg. Aortic valve peak  gradient measures 6.9 mmHg. Aortic valve area, by VTI measures 2.45 cm. Pulmonic Valve: The pulmonic valve was normal in structure. Pulmonic valve regurgitation is not visualized. No evidence of pulmonic stenosis. Aorta: The aortic root is normal in size and structure. Venous: The inferior vena cava is normal in size with greater than 50% respiratory variability, suggesting right atrial pressure of 3 mmHg. IAS/Shunts: No atrial level shunt detected by color flow Doppler.  LEFT VENTRICLE PLAX 2D LVIDd:         4.20 cm  Diastology LVIDs:         2.70 cm  LV e' medial:    6.74 cm/s LV PW:         1.30 cm  LV E/e' medial:  9.0 LV IVS:        1.40 cm  LV e' lateral:   9.03 cm/s LVOT diam:     2.00 cm  LV E/e' lateral: 6.7 LV SV:         63 LV SV Index:   30 LVOT Area:     3.14 cm  RIGHT VENTRICLE            IVC RV Basal diam:  2.70 cm    IVC diam: 2.30 cm RV S prime:     9.30 cm/s TAPSE (M-mode): 1.9 cm LEFT ATRIUM             Index       RIGHT ATRIUM           Index LA diam:        3.90 cm 1.85 cm/m  RA Area:     14.70 cm LA Vol (A2C):   60.2 ml 28.58 ml/m RA Volume:   36.90 ml  17.52 ml/m LA Vol (A4C):   54.8 ml 26.02 ml/m  LA Biplane Vol: 61.9 ml 29.39 ml/m  AORTIC VALVE AV Area (Vmax):    2.21 cm AV Area (Vmean):   1.94 cm AV Area (VTI):     2.45 cm AV Vmax:           131.00 cm/s AV Vmean:          92.600 cm/s AV VTI:            0.258 m AV Peak Grad:      6.9 mmHg AV Mean Grad:      4.0 mmHg LVOT Vmax:         92.00 cm/s LVOT Vmean:        57.300 cm/s LVOT VTI:          0.201 m LVOT/AV VTI ratio: 0.78  AORTA Ao Root diam: 3.30 cm Ao Asc diam:  3.10 cm MITRAL VALVE MV Area (PHT): 2.69 cm    SHUNTS MV Area VTI:   2.76 cm    Systemic VTI:  0.20 m MV Peak grad:  3.9 mmHg    Systemic Diam: 2.00 cm MV Mean grad:  1.0 mmHg MV Vmax:       0.99 m/s MV Vmean:      48.0 cm/s MV Decel Time: 282 msec MV E velocity: 60.80 cm/s MV A velocity: 75.40 cm/s MV E/A ratio:  0.81 Ena Dawley MD Electronically signed by  Ena Dawley MD Signature Date/Time: 06/24/2020/3:46:30 PM    Final        Medical Problem List and Plan: 1.  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  -patient may shower  -ELOS/Goals: modI 10-14 days 2.  Antithrombotics: -DVT/anticoagulation: SCDs  -antiplatelet therapy: N/A 3. Pain Management: Tylenol as needed 4. Mood: Provide emotional support  -antipsychotic agents: N/A 5. Neuropsych: This patient is capable of making decisions on her own behalf. 6. Skin/Wound Care: Routine skin checks 7. Fluids/Electrolytes/Nutrition: Routine in and outs with follow-up chemistries 8.  Diabetes mellitus.  CBGs 98-125. Hemoglobin A1c 6.2.  Continue Glucophage 1000 mg daily.   9.  Hypertension.  Well controlled, continue HCTZ 25 mg daily 10.  History of kidney stones with stent placement 2017 11. Obesity (BMI 36.48): Provide dietary counseling 12. Hypokalemic to 3.3 on 2/7, repeat tomorrow  Cathlyn Parsons, PA-C 06/25/2020   I have personally performed a face to face diagnostic evaluation, including, but not limited to relevant history and physical exam findings, of this patient and developed relevant assessment and plan.  Additionally, I have reviewed and concur with the physician assistant's documentation above.  Leeroy Cha, MD

## 2020-06-25 NOTE — Progress Notes (Signed)
Inpatient Rehabilitation Medication Review by a Pharmacist  A complete drug regimen review was completed for this patient to identify any potential clinically significant medication issues.  Clinically significant medication issues were identified:  no  Check AMION for pharmacist assigned to patient if future medication questions/issues arise during this admission.  Pharmacist comments: Home Meds resumed appropriately except Pennsaid 2% solution.  This is a Diclofenac product.  If patient complains of arthritic pain, the pharmacy stocks Voltaren (Diclofenac) Gel which would be comparable.    Time spent performing this drug regimen review (minutes):  [place], Pharm.D., BCPS Clinical Pharmacist  06/25/2020 6:33 PM

## 2020-06-25 NOTE — Progress Notes (Signed)
Cheryl Ribas, MD  Physician  Physical Medicine and Rehabilitation  PMR Pre-admission      Signed  Date of Service:  06/25/2020  1:49 PM      Related encounter: ED to Hosp-Admission (Current) from 06/22/2020 in [facility]       Signed          Show:Clear all '[x]' Manual'[x]' Template'[x]' Copied  Added by: '[x]' Raechel Ache, OT'[x]' Raulkar, Worthy Keeler, MD   '[]' Hover for details  PMR Admission Coordinator Pre-Admission Assessment   Patient: Cheryl Ortiz is an 70 y.o., female MRN: [id number] DOB: 11-27-1950 Height: '5\' 6"'  (167.6 cm) Weight: 102.5 kg   Insurance Information HMO:     PPO:      PCP:      IPA:      80/20: yes     OTHER:  PRIMARY: Medicare Part A and B       Policy#: 16/02/1951      Subscriber: pt CM Name:       Phone#:      Fax#:  Pre-Cert#:       Employer:  Benefits:  Phone #: online     Name: per [id number]. [address] [place] on 06/25/20 Eff. Date: Part A and B effective: 07/17/2017     Deduct: $1,556      Out of Pocket Max: NA      Life Max: NA CIR: Covered per Medicare guidelines once yearly deductible has been met      SNF: days 1-20, 100%; days 21-100, 80% Outpatient: 80%     Co-Pay: 20% Home Health: 100%      Co-Pay: DME: 80%     Co-Pay: 20% Providers: Pt's choice  SECONDARY: [facility]      Policy#: 16/05/2017     Phone#: [phone]   Financial Counselor:      Phone#:    The "Data Collection Information Summary" for patients in Inpatient Rehabilitation Facilities with attached "Privacy Act [place] Records" was provided and verbally reviewed with: Patient and Family   Emergency Contact Information         Contact Information     Name Relation Home Work [place] Daughter [phone]   [phone]    [phone]             Current Medical History  Patient Admitting Diagnosis: right frontal lobe ICH   History of Present Illness: Cheryl Ortiz is a 70 year old right-handed  female history of diabetes mellitus with peripheral neuropathy, hypertension, hyperlipidemia, kidney stones with stent placement 2017, she quit smoking 22 years ago as well as 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  Per chart review independent prior to admission.  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  1 level home with level entry.  Presented 06/22/2020 with acute onset of left-sided weakness.  Cranial CT scan showed an area of hemorrhage within the anterior medial right frontal lobe.  Adjacent small amount of blood to the left side of the falx could be within the left frontal lobe or subarachnoid blood.  No mass-effect or midline shift.  CT angiogram of head and neck no sign of high flow vascular malformation in the region of the right frontal hemorrhage.  No signs of venous thrombosis.  MRI of the brain right frontal and smaller left frontal intraparenchymal hemorrhage likely small in size when comparing across modalities.  Small volume of adjacent hemorrhage along  the falx and suspected trace right frontal subarachnoid hemorrhage.  No surrounding restricted diffusion to suggest hemorrhagic transformation or infarct.  No visible lesion noted.  Echocardiogram with ejection fraction of 60 to 65% no wall motion abnormalities grade 2 diastolic dysfunction.  Neurology follow-up close monitoring of blood pressure.  Tolerating a regular consistency diet.  Therapy evaluations completed due to patient's left-sided weakness. Pt is to be admitted for a comprehensive rehab program on 06/25/20.   Complete NIHSS TOTAL: 2   Patient's medical record from  [facility] has been reviewed by the rehabilitation admission coordinator and physician.   Past Medical History      Past Medical History:  Diagnosis Date  . Aneurysm ([place]) 06/2015    brain-small will recheck in 1 year  . Cold    . Cystocele 07/12/2015  . Hyperlipidemia    .  Hypertension    . Neuropathy of both feet      tingling/numbness bilateral feet ,rt arm,hands-nerve study scheduled  . Osteoarthritis    . Renal disorder      kidney stones  . Seasonal allergies    . Type 2 diabetes mellitus (HCC)        Family History   family history includes Arthritis in her mother; Breast cancer in her sister; Cancer in her mother; Cerebral palsy in her brother; Early death in her brother; Fibroids in her sister; Heart attack in her brother; Heart disease in her brother; Hyperlipidemia in her sister; Hypertension in her brother, brother, father, and mother; Pneumonia in her father.   Prior Rehab/Hospitalizations Has the patient had prior rehab or hospitalizations prior to admission? Yes   Has the patient had major surgery during 100 days prior to admission? No               Current Medications   Current Facility-Administered Medications:  .   stroke: mapping our early stages of recovery book, , Does not apply, Once, Donnetta Simpers, MD .  acetaminophen (TYLENOL) tablet 650 mg, 650 mg, Oral, Q4H PRN, 650 mg at 06/23/20 0630 **OR** acetaminophen (TYLENOL) 160 MG/5ML solution 650 mg, 650 mg, Per Tube, Q4H PRN **OR** acetaminophen (TYLENOL) suppository 650 mg, 650 mg, Rectal, Q4H PRN, Donnetta Simpers, MD .  Chlorhexidine Gluconate Cloth 2 % PADS 6 each, 6 each, Topical, Daily, Donnetta Simpers, MD, 6 each at 06/24/20 1810 .  insulin aspart (novoLOG) injection 0-15 Units, 0-15 Units, Subcutaneous, TID WC, Donnetta Simpers, MD, 2 Units at 06/25/20 0815 .  insulin aspart (novoLOG) injection 4 Units, 4 Units, Subcutaneous, TID WC, Donnetta Simpers, MD, 4 Units at 06/25/20 1219 .  labetalol (NORMODYNE) injection 10-20 mg, 10-20 mg, Intravenous, Q2H PRN, Rosalin Hawking, MD .  oxyCODONE (Oxy IR/ROXICODONE) immediate release tablet 5 mg, 5 mg, Oral, Once, Donnetta Simpers, MD .  pantoprazole (PROTONIX) EC tablet 40 mg, 40 mg, Oral, Daily, Rosalin Hawking, MD, 40 mg at  06/25/20 0819 .  senna-docusate (Senokot-S) tablet 1 tablet, 1 tablet, Oral, BID, Donnetta Simpers, MD   Patients Current Diet:     Diet Order                      Diet Carb Modified Fluid consistency: Thin; Room service appropriate? Yes with Assist  Diet effective now                      Precautions / Restrictions Precautions Precautions: Fall Precaution Comments: Lt neglect and poor attention Restrictions Weight  Bearing Restrictions: No    Has the patient had 2 or more falls or a fall with injury in the past year? 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 Independent   Indoor Mobility: Did the patient need assistance with walking from room to room (with or without device)? Independent   Stairs: Did the patient need assistance with internal or external stairs (with or without device)? Independent   Functional Cognition: Did the patient need help planning regular tasks such as shopping or remembering to take medications? Independent   Home Assistive Devices / Equipment Home Equipment: None   Prior Device Use: Indicate devices/aids used by the patient prior to current illness, exacerbation or injury? None of the above   Current Functional Level Cognition   Arousal/Alertness: Awake/alert Overall Cognitive Status: Impaired/Different from baseline Current Attention Level: Sustained (with up to max cues) Orientation Level: Oriented X4 Following Commands: Follows one step commands inconsistently,Follows one step commands with increased time Safety/Judgement: Decreased awareness of deficits,Decreased awareness of safety General Comments: Pt noted with motor perserveration, and is very distracted.  Upon myentrance on pt's Lt, pt rubbing a water bottle  label with her Rt hand and looking to the Rt.  Pt verbally greeted me.  she answered all questions, but did not attempt to turn head and look to to me on her left.  When asked what color hair I had, she made up a color, but did not initiate lookig to me on her lt.  When cued to look at me, she still was unable to due to being distracted by rubbing the label which was on her Rt.  She eventually did turn to her left, once I moved to her right and instructed her to follow me with her eyes.  She required >4 mins to initiate moving Lt LE off the bed to move to the EOB.  She moved her Rt LE off the bed, but made no attempt to move her Lt LE despite max cues and prompting.  She eventually required max A to initiate this.  Pt attempted to change the channels of her TV, but instead decreased the volume perseverating on pushing the button.  She was unable to recognize her error, or correct her error without max prompting and assist from me. Attention: Focused,Sustained Focused Attention: Appears intact Sustained Attention: Appears intact Memory: Impaired Memory Impairment: Decreased short term memory Awareness: Impaired Awareness Impairment: Intellectual impairment    Extremity Assessment (includes Sensation/Coordination)   Upper Extremity Assessment: Generalized weakness LUE Deficits / Details: pt moves Lt UE actively, but doesn't lift Lt UE  when prompted.  Lt shoulder appears weak. LUE Sensation:  (pt reports no difference in sensation bilaterally) LUE Coordination: decreased gross motor  Lower Extremity Assessment: Defer to PT evaluation LLE Deficits / Details: 4/5 at ankle and 3/5 at knee extensors. multiple cues to complete movements, but then able to hold against some resistance. LLE Sensation:  (pt reports no difference in sensation bilaterally) LLE Coordination: decreased fine motor,decreased gross motor     ADLs   Overall ADL's : Needs assistance/impaired Eating/Feeding: Moderate  assistance,Sitting Grooming: Wash/dry hands,Wash/dry face,Oral care,Moderate assistance,Sitting Grooming Details (indicate cue type and reason): Pt self distracts and requires assist to complete tasks. Upper Body Bathing: Sitting,Maximal assistance Lower Body Bathing: Maximal  assistance,Sit to/from stand Upper Body Dressing : Maximal assistance,Sitting Lower Body Dressing: Sit to/from stand,Maximal assistance Lower Body Dressing Details (indicate cue type and reason): Pt was instructed to take of her socks.  She required the request to be repeated 4 times before she could initiate it. She eventually pulled sock half way off, then became distracted by the need to scratch her foot, and was unable to complete the task. Toilet Transfer: Moderate assistance,Stand-pivot,BSC Toilet Transfer Details (indicate cue type and reason): verbal cues and mod A to fully turn to the chair before attempting to sit Toileting- Clothing Manipulation and Hygiene: Maximal assistance,Sit to/from stand Functional mobility during ADLs: Moderate assistance     Mobility   Overal bed mobility: Needs Assistance Bed Mobility: Supine to Sit Rolling: Max assist Supine to sit: Max assist General bed mobility comments: Pt unable to initiate Lt LE movement on her own, requiring my assist to do so.  Pt distracted and unable to sequence task requirng assist to lift trunk .     Transfers   Overall transfer level: Needs assistance Equipment used: 1 person hand held assist Transfers: Stand Pivot Transfers,Sit to/from Stand Sit to Stand: Mod assist Stand pivot transfers: Mod assist General transfer comment: requires assist to initiate activity.  Once assist provided to initiate activity, she was able to turn toward the chair, but became distracted and unable to re initiate the task without physical assistance.  Pt with poorly controlled descent into the chair     Ambulation / Gait / Stairs / Wheelchair Mobility    Ambulation/Gait Ambulation/Gait assistance: Mod assist Gait Distance (Feet): 8 Feet Assistive device: Rolling walker (2 wheeled) Gait Pattern/deviations: Step-to pattern,Decreased stride length,Decreased dorsiflexion - right,Decreased dorsiflexion - left,Shuffle General Gait Details: small steps laterally and forward in room. cues for clearance with BLE, modA with RW management. needs frequent cues for directioning and benefits from tactile assist at times Gait velocity: decreased     Posture / Balance Balance Overall balance assessment: Needs assistance Sitting-balance support: Single extremity supported,Feet supported Sitting balance-Leahy Scale: Fair Standing balance support: Single extremity supported Standing balance-Leahy Scale: Poor     Special needs/care consideration Diabetic management yes    Previous Home Environment (from acute therapy documentation) Living Arrangements: Other relatives Available Help at Discharge: Family,Available PRN/intermittently Type of Home: House Home Layout: One level Home Access: Level entry Bathroom Shower/Tub: Chiropodist: Standard Additional Comments: Pt's brother, who is blind, lives with her.   Discharge Living Setting Plans for Discharge Living Setting: Patient's home,Lives with (comment) (brother who is deaf and blind) Type of Home at Discharge: House Discharge Home Layout: One level Discharge Home Access: Stairs to enter Entrance Stairs-Rails: None Entrance Stairs-Number of Steps: 1 Discharge Bathroom Shower/Tub: Walk-in shower Discharge Bathroom Toilet: Standard Discharge Bathroom Accessibility: Yes How Accessible: Accessible via walker Does the patient have any problems obtaining your medications?: No   Social/Family/Support Systems Patient Roles: Caregiver Contact Information: daugther Cheryl Ortiz): [phone] Anticipated Caregiver: daughter + older brother, sister in law Anticipated Caregiver's Contact  Information: see above Ability/Limitations of Caregiver: Min A Caregiver Availability: 24/7 (daughter plans to take [facility]) Discharge Plan Discussed with Primary Caregiver: Yes Is Caregiver In Agreement with Plan?: Yes Does Caregiver/Family have Issues with Lodging/Transportation while Pt is in Rehab?: No   Goals Patient/Family Goal for Rehab: PT: Supervision; OT: Supervision/Min A; SLP: Supervision Expected length of stay: 10-14 days Pt/Family Agrees to Admission and willing to participate: Yes Program Orientation Provided & Reviewed with Pt/Caregiver Including Roles  &  Responsibilities: Yes  Barriers to Discharge: Home environment access/layout  Barriers to Discharge Comments: one step to enter.   Decrease burden of Care through IP rehab admission: NA   Possible need for SNF placement upon discharge: Not anticipated; pt has good social support at DC and is motivated to return home at DC.    Patient Condition: I have reviewed medical records from [facility], spoken with MD, and patient and daughter. I met with patient at the bedside for inpatient rehabilitation assessment.  Patient will benefit from ongoing PT, OT and SLP, can actively participate in 3 hours of therapy a day 5 days of the week, and can make measurable gains during the admission.  Patient will also benefit from the coordinated team approach during an Inpatient Acute Rehabilitation admission.  The patient will receive intensive therapy as well as Rehabilitation physician, nursing, social worker, and care management interventions.  Due to safety, skin/wound care, disease management, medication administration, pain management and patient education the patient requires 24 hour a day rehabilitation nursing.  The patient is currently Mod A with mobility and Mod A to Max A for basic ADLs.  Discharge setting and therapy post discharge at home with home health is anticipated.  Patient has agreed to participate in the Acute  Inpatient Rehabilitation Program and will admit today.   Preadmission Screen Completed By:  Raechel Ache, 06/25/2020 2:49 PM ______________________________________________________________________   Discussed status with Dr. Ranell Patrick on 06/25/20 at 1:56PM and received approval for admission today.   Admission Coordinator:  Raechel Ache, OT, time 1:56PM/Date 06/25/20    Assessment/Plan: Diagnosis: ICH 1. Does the need for close, 24 hr/day Medical supervision in concert with the patient's rehab needs make it unreasonable for this patient to be served in a less intensive setting? Yes 2. Co-Morbidities requiring supervision/potential complications:  1. Obesity (BMI 36.48): provide lifestyle education 2. HLD 3. HTN: monitor BP TID 4. Type 2 DM: monitor CBGs AC/HS 5. Cystocele 3. Due to bladder management, bowel management, safety, skin/wound care, disease management, medication administration, pain management and patient education, does the patient require 24 hr/day rehab nursing? Yes 4. Does the patient require coordinated care of a physician, rehab nurse, PT, OT to address physical and functional deficits in the context of the above medical diagnosis(es)? Yes Addressing deficits in the following areas: balance, endurance, locomotion, strength, transferring, bowel/bladder control, bathing, dressing, feeding, grooming, toileting and psychosocial support 5. Can the patient actively participate in an intensive therapy program of at least 3 hrs of therapy 5 days a week? Yes 6. The potential for patient to make measurable gains while on inpatient rehab is excellent 7. Anticipated functional outcomes upon discharge from inpatient rehab: modified independent PT, modified independent OT, independent SLP 8. Estimated rehab length of stay to reach the above functional goals is: 10-14 days 9. Anticipated discharge destination: Home 10. Overall Rehab/Functional Prognosis: excellent     MD Signature: Leeroy Cha, MD          Revision History                                  Note Details  Author Cheryl Ribas, MD File Time 06/25/2020  3:07 PM  Author Type Physician Status Signed  Last Editor Cheryl Ribas, MD Service Physical Medicine and Rehabilitation

## 2020-06-26 DIAGNOSIS — I10 Essential (primary) hypertension: Secondary | ICD-10-CM | POA: Diagnosis not present

## 2020-06-26 DIAGNOSIS — E876 Hypokalemia: Secondary | ICD-10-CM

## 2020-06-26 DIAGNOSIS — I611 Nontraumatic intracerebral hemorrhage in hemisphere, cortical: Secondary | ICD-10-CM

## 2020-06-26 DIAGNOSIS — E1165 Type 2 diabetes mellitus with hyperglycemia: Secondary | ICD-10-CM | POA: Diagnosis not present

## 2020-06-26 LAB — CBC WITH DIFFERENTIAL/PLATELET
Abs Immature Granulocytes: 0.02 10*3/uL (ref 0.00–0.07)
Basophils Absolute: 0.1 10*3/uL (ref 0.0–0.1)
Basophils Relative: 1 %
Eosinophils Absolute: 0.1 10*3/uL (ref 0.0–0.5)
Eosinophils Relative: 2 %
HCT: 38.1 % (ref 36.0–46.0)
Hemoglobin: 12.8 g/dL (ref 12.0–15.0)
Immature Granulocytes: 0 %
Lymphocytes Relative: 24 %
Lymphs Abs: 1.5 10*3/uL (ref 0.7–4.0)
MCH: 29.7 pg (ref 26.0–34.0)
MCHC: 33.6 g/dL (ref 30.0–36.0)
MCV: 88.4 fL (ref 80.0–100.0)
Monocytes Absolute: 0.4 10*3/uL (ref 0.1–1.0)
Monocytes Relative: 7 %
Neutro Abs: 4.2 10*3/uL (ref 1.7–7.7)
Neutrophils Relative %: 66 %
Platelets: 262 10*3/uL (ref 150–400)
RBC: 4.31 MIL/uL (ref 3.87–5.11)
RDW: 13.3 % (ref 11.5–15.5)
WBC: 6.3 10*3/uL (ref 4.0–10.5)
nRBC: 0 % (ref 0.0–0.2)

## 2020-06-26 LAB — COMPREHENSIVE METABOLIC PANEL
ALT: 17 U/L (ref 0–44)
AST: 18 U/L (ref 15–41)
Albumin: 3.5 g/dL (ref 3.5–5.0)
Alkaline Phosphatase: 52 U/L (ref 38–126)
Anion gap: 13 (ref 5–15)
BUN: 18 mg/dL (ref 8–23)
CO2: 22 mmol/L (ref 22–32)
Calcium: 9.2 mg/dL (ref 8.9–10.3)
Chloride: 103 mmol/L (ref 98–111)
Creatinine, Ser: 1 mg/dL (ref 0.44–1.00)
GFR, Estimated: >60 mL/min (ref >60)
Glucose, Bld: 132 mg/dL — ABNORMAL HIGH (ref 70–99)
Potassium: 3.6 mmol/L (ref 3.5–5.1)
Sodium: 138 mmol/L (ref 135–145)
Total Bilirubin: 1 mg/dL (ref 0.3–1.2)
Total Protein: 6.9 g/dL (ref 6.5–8.1)

## 2020-06-26 LAB — GLUCOSE, CAPILLARY
Glucose-Capillary: 113 mg/dL — ABNORMAL HIGH (ref 70–99)
Glucose-Capillary: 148 mg/dL — ABNORMAL HIGH (ref 70–99)
Glucose-Capillary: 120 mg/dL — ABNORMAL HIGH (ref 70–99)
Glucose-Capillary: 159 mg/dL — ABNORMAL HIGH (ref 70–99)

## 2020-06-26 NOTE — IPOC Note (Signed)
Individualized overall Plan of Care [facility]) Patient Details Name: Cheryl Ortiz MRN: [id number] DOB: 12/06/1950  Admitting Diagnosis: ICH (intracerebral hemorrhage) [facility])  Hospital Problems: Principal Problem:   ICH (intracerebral hemorrhage) ([place]) Active Problems:   Hypokalemia   Essential hypertension   Controlled type 2 diabetes mellitus with hyperglycemia, without long-term current use of insulin ([place])     Functional Problem List: Nursing Edema,Endurance,Medication Management,Nutrition,Pain,Perception,Safety  PT Balance,Sensory,Motor,Endurance,Perception,Safety  OT Balance,Endurance,Motor,Perception,Sensory,Cognition  SLP Linguistic,Cognition  TR         Basic ADL's: OT Bathing,Dressing,Toileting     Advanced  ADL's: OT Light Housekeeping     Transfers: PT Bed Mobility,Bed to [employer]  OT Toilet,Tub/Shower     Locomotion: PT Ambulation,Stairs     Additional Impairments: OT Fuctional Use of Upper Extremity  SLP Social Cognition,Communication expression Problem Solving,Memory,Attention,Social Interaction,Awareness  TR      Anticipated Outcomes Item Anticipated Outcome  Self Feeding independent  Swallowing      Basic self-care  supervision  Toileting  supervision   Bathroom Transfers supervision  Bowel/Bladder  Supervision  Transfers  supervision  Locomotion  supervision with LRAD  Communication  Supervision  Cognition  Supervision  Pain  <3  Safety/Judgment  Supervision   Therapy Plan: PT Intensity: Minimum of 1-2 x/day ,45 to 90 minutes PT Frequency: 5 out of 7 days PT Duration Estimated Length of Stay: 14-16 days OT Intensity: Minimum of 1-2 x/day, 45 to 90 minutes OT Frequency: 5 out of 7 days OT Duration/Estimated Length of Stay: 14-16 days SLP Intensity: Minumum of 1-2 x/day, 30 to 90 minutes SLP Frequency: 3 to 5 out of 7 days SLP Duration/Estimated Length of Stay: 2 weeks    Team Interventions: Nursing  Interventions Patient/Family Education,Disease Management/Prevention,Pain Management,Medication Management,Discharge Planning,Psychosocial Support  PT interventions Ambulation/gait training,DME/adaptive equipment instruction,Neuromuscular re-education,Stair training,UE/LE Strength taining/ROM,Wheelchair propulsion/positioning,UE/LE Coordination activities,Therapeutic Activities,Balance/vestibular training,Discharge planning,Cognitive remediation/compensation,Disease management/prevention,Functional mobility training,Patient/family education,Therapeutic Exercise,Splinting/orthotics,Visual/perceptual remediation/compensation  OT Interventions Balance/vestibular training,Cognitive remediation/compensation,Discharge planning,Functional mobility training,DME/adaptive equipment instruction,Neuromuscular re-education,Patient/family education,Psychosocial support,Therapeutic Activities,Self Care/advanced ADL retraining,Therapeutic Exercise,UE/LE Strength taining/ROM,UE/LE Coordination activities,Visual/perceptual remediation/compensation  SLP Interventions Cognitive remediation/compensation,Cueing hierarchy,Functional tasks,Internal/external aids,Speech/Language facilitation,Patient/family education,Therapeutic Activities  TR Interventions    SW/CM Interventions Discharge Planning,Psychosocial Support,Patient/Family Education   Barriers to Discharge MD  Medical stability, Weight, and Behavior  Nursing Inaccessible home environment,Decreased caregiver support,Home environment access/layout,Lack of/limited family support,Weight,Medication compliance,Behavior    PT Incontinence    OT      SLP      SW       Team Discharge Planning: Destination: PT-Home ,OT- Home , SLP-Home Projected Follow-up: PT-Home health PT,24 hour supervision/assistance, OT-  Home health OT, SLP-24 hour supervision/assistance,Outpatient SLP,Home Health SLP Projected Equipment Needs: PT-Rolling walker with 5" wheels, OT- To be  determined, SLP-None recommended by SLP Equipment Details: PT- , OT-  Patient/family involved in discharge planning: PT- Patient,Family member/caregiver,  OT-Patient, SLP-Patient,Family member/caregiver  MD ELOS: 12-14 days. 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  Presented 06/22/2020 with acute onset of left-sided weakness.  Cranial CT scan showed an area of hemorrhage within the anterior medial right frontal lobe.  Adjacent small amount of blood to the left side of the falx could be within the left frontal lobe or subarachnoid blood.  No mass-effect or midline shift.  CT  angiogram of head and neck no sign of high flow vascular malformation in the region of the right frontal hemorrhage.  No signs of venous thrombosis.  MRI of the brain right frontal and smaller left frontal intraparenchymal hemorrhage likely small in size when comparing across modalities.  Small volume of adjacent hemorrhage along the falx and suspected trace right frontal subarachnoid hemorrhage.  No surrounding restricted diffusion to suggest hemorrhagic transformation or infarct.  No visible lesion noted.  Echocardiogram with ejection fraction of 60 to 65% no wall motion abnormalities grade 2 diastolic dysfunction.  Neurology follow-up close monitoring of blood pressure.  Tolerating a regular consistency diet.  Admitted to CIR with left sided weakness, impaired mobility and ADLs, constipation, joint pain, and myalgias.  Will set goals for Supervision with PT/OT/SLP.   Due to the current state of emergency, patients may not be receiving their 3-hours of Medicare-mandated therapy.  See Team Conference Notes for weekly updates to the  plan of care

## 2020-06-26 NOTE — Evaluation (Signed)
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 Aneurysm ([place]) 06/2015   brain-small will recheck in 1 year  . Cold   . Cystocele 07/12/2015  . Hyperlipidemia   . Hypertension   . Neuropathy of both feet    tingling/numbness bilateral feet ,rt arm,hands-nerve study scheduled  . Osteoarthritis   . Renal disorder    kidney stones  . Seasonal allergies   . Type 2 diabetes mellitus ([place])    Past Surgical History:  Past Surgical History:  Procedure Laterality Date  . CHOLECYSTECTOMY  1999  . COLONOSCOPY N/A 06/15/2014   Procedure: COLONOSCOPY;  Surgeon: Rogene Houston, MD;  [facility];  Service: Endoscopy;  Laterality: N/A;  1145  . CYSTOSCOPY WITH RETROGRADE PYELOGRAM, URETEROSCOPY AND STENT PLACEMENT Bilateral 08/20/2015   Procedure: CYSTOSCOPY WITH RETROGRADE PYELOGRAM, URETEROSCOPY AND STENT PLACEMENT WITH STONE EXTRACTION WITH BASKET;  Surgeon: Cleon Gustin, MD;  Location: [facility];  Service: Urology;  Laterality: Bilateral;  . ESOPHAGEAL DILATION N/A 03/23/2015   Procedure: ESOPHAGEAL DILATION;  Surgeon: Rogene Houston, MD;  [facility];  Service: Endoscopy;  Laterality: N/A;  . ESOPHAGOGASTRODUODENOSCOPY N/A 03/23/2015   Procedure: ESOPHAGOGASTRODUODENOSCOPY (EGD);  Surgeon: Rogene Houston, MD;  [facility];  Service: Endoscopy;  Laterality: N/A;  8:55 - moved to 12:35 - Ann to notify pt  . FOOT SURGERY Bilateral   . HOLMIUM LASER APPLICATION Bilateral 0/0/9381   Procedure: HOLMIUM LASER APPLICATION;  Surgeon: Cleon Gustin, MD;  [facility];  Service: Urology;  Laterality: Bilateral;  . 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  Per chart review independent prior to admission.  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  1 level home with level entry.  Presented 06/22/2020 with acute onset of left-sided weakness.  Cranial CT scan showed an area of hemorrhage within the anterior medial right frontal lobe.  Adjacent small amount of blood to the left side of the falx could be within the left frontal lobe or subarachnoid blood.  No mass-effect or midline shift.  CT angiogram of head and neck no sign of high flow vascular malformation in the region of the right frontal hemorrhage.  No signs of venous thrombosis.  MRI of the brain right frontal and smaller left frontal intraparenchymal hemorrhage likely small in size when comparing across modalities.  Small volume of adjacent hemorrhage along the falx and suspected trace right frontal subarachnoid hemorrhage.  No surrounding restricted diffusion to suggest hemorrhagic transformation or infarct.  No visible lesion noted.  Echocardiogram with  ejection fraction of 60 to 65% no wall motion abnormalities grade 2 diastolic dysfunction.  Neurology follow-up close monitoring of blood pressure.  Tolerating a regular consistency diet.  Admitted to CIR with left  sided weakness, impaired mobility and ADLs, constipation, joint pain, and myalgias.    Patient transferred to CIR on 06/25/2020 .    Patient currently requires mod with basic self-care skills secondary to decreased cardiorespiratoy endurance, abnormal tone, unbalanced muscle activation and decreased motor planning, decreased attention to left, decreased awareness, decreased problem solving, decreased memory and delayed processing and decreased standing balance, hemiplegia and decreased balance strategies.  Prior to hospitalization, patient was fully independent and her brother's caregiver.  Patient will benefit from skilled intervention to increase independence with basic self-care skills and increase level of independence with iADL prior to discharge home with care partner.  Anticipate patient will require 24 hour supervision and follow up home health.  OT - End of Session Activity Tolerance: Tolerates 10 - 20 min activity with multiple rests Endurance Deficit: Yes OT Assessment Rehab Potential (ACUTE ONLY): Excellent OT Patient demonstrates impairments in the following area(s): Balance;Endurance;Motor;Perception;Sensory;Cognition OT Basic ADL's Functional Problem(s): Bathing;Dressing;Toileting OT Advanced ADL's Functional Problem(s): Light Housekeeping OT Transfers Functional Problem(s): Toilet;Tub/Shower OT Additional Impairment(s): Fuctional Use of Upper Extremity OT Plan OT Intensity: Minimum of 1-2 x/day, 45 to 90 minutes OT Frequency: 5 out of 7 days OT Duration/Estimated Length of Stay: 14-16 days OT Treatment/Interventions: Balance/vestibular training;Cognitive remediation/compensation;Discharge planning;Functional mobility training;DME/adaptive equipment instruction;Neuromuscular re-education;Patient/family education;Psychosocial support;Therapeutic Activities;Self Care/advanced ADL retraining;Therapeutic Exercise;UE/LE Strength taining/ROM;UE/LE Coordination activities;Visual/perceptual  remediation/compensation OT Self Feeding Anticipated Outcome(s): independent OT Basic Self-Care Anticipated Outcome(s): supervision OT Toileting Anticipated Outcome(s): supervision OT Bathroom Transfers Anticipated Outcome(s): supervision OT Recommendation Patient destination: Home Follow Up Recommendations: Home health OT Equipment Recommended: To be determined   OT Evaluation Precautions/Restrictions  Precautions Precautions: Fall Restrictions Weight Bearing Restrictions: No Vital Signs Therapy Vitals Temp: 97.7 F (36.5 C) Pulse Rate: 90 Resp: 19 BP: 118/70 Patient Position (if appropriate): Sitting Oxygen Therapy SpO2: 100 % O2 Device: Room Air Pain Pain Assessment Pain Scale: 0-10 Pain Score: 0-No pain Home Living/Prior Functioning Home Living Family/patient expects to be discharged to:: Private residence Living Arrangements: Other relatives Available Help at Discharge: Family,Available PRN/intermittently Type of Home: House Home Access: Level entry Home Layout: One level Bathroom Shower/Tub: Chiropodist: Standard Additional Comments: Pt's brother, who is blind, lives with her.  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 Vision Baseline Vision/History: Wears glasses Wears Glasses: At all times Patient Visual Report: No change from baseline (pt does report having more "eye floaters" in R eye) Vision Assessment?: Yes Eye Alignment: Within Functional Limits Ocular Range of Motion: Within Functional Limits Alignment/Gaze Preference: Within Defined Limits Tracking/Visual Pursuits: Decreased smoothness of vertical tracking;Unable to  hold eye position out of midline Visual Fields: No apparent deficits Perception  Perception: Impaired Inattention/Neglect: Does not attend to left visual field;Does not attend to left side of body Praxis Praxis: Impaired Praxis Impairment Details: Motor planning Praxis-Other Comments: mild motor planning deficits noted Cognition Overall Cognitive Status: Impaired/Different from baseline Arousal/Alertness: Awake/alert Orientation Level: Person;Place;Situation Person: Oriented Place: Oriented Situation: Oriented Year: 2022 Month: February Day of Week: Incorrect (wednesday) Memory: Impaired Memory Impairment: Decreased short term memory  Decreased Short Term Memory: Verbal complex;Functional complex Immediate Memory Recall: Sock;Blue;Bed Memory Recall Sock: Without Cue Memory Recall Blue: Without Cue Memory Recall Bed: Without Cue Attention: Selective Focused Attention: Appears intact Sustained Attention: Appears intact Selective Attention: Impaired Selective Attention Impairment: Functional complex;Verbal basic Awareness: Impaired Awareness Impairment: Emergent impairment Problem Solving: Impaired Problem Solving Impairment: Functional basic;Verbal basic Safety/Judgment: Appears intact Comments: slow cognitive processing Sensation Sensation Light Touch: Appears Intact Hot/Cold: Appears Intact Proprioception: Impaired by gross assessment Stereognosis: Not tested Additional Comments: decreased proprioception on L side Coordination Gross Motor Movements are Fluid and Coordinated: No Fine Motor Movements are Fluid and Coordinated: No Coordination and Movement Description: limited AROM of LUE impacts coordination Finger Nose Finger Test: able to complete test on B sides, but slower on L Motor  Motor Motor: Hemiplegia  Trunk/Postural Assessment  Cervical Assessment Cervical Assessment: Within Functional Limits Thoracic Assessment Thoracic Assessment: Within Functional  Limits Lumbar Assessment Lumbar Assessment: Within Functional Limits Postural Control Postural Control: Within Functional Limits  Balance Dynamic Sitting Balance Dynamic Sitting - Level of Assistance: 5: Stand by assistance Static Standing Balance Static Standing - Level of Assistance: 4: Min assist Dynamic Standing Balance Dynamic Standing - Level of Assistance: 3: Mod assist Extremity/Trunk Assessment RUE Assessment RUE Assessment: Within Functional Limits General Strength Comments: 4+/5 LUE Assessment Passive Range of Motion (PROM) Comments: WFL - but resistance at end range of sh flexion due to increased tone Active Range of Motion (AROM) Comments: sh flexion to 80, distally WFL General Strength Comments: sh flex 3-/5, elbow 3+/5, grasp 3+/5  Care Tool Care Tool Self Care Eating   Eating Assist Level: Set up assist    Oral Care    Oral Care Assist Level: Set up assist    Bathing   Body parts bathed by patient: Left arm;Chest;Abdomen;Front perineal area;Buttocks;Right upper leg;Left upper leg;Face Body parts bathed by helper: Left lower leg;Right arm;Right lower leg   Assist Level: Moderate Assistance - Patient 50 - 74%    Upper Body Dressing(including orthotics)   What is the patient wearing?: Pull over shirt;Bra   Assist Level: Moderate Assistance - Patient 50 - 74%    Lower Body Dressing (excluding footwear)   What is the patient wearing?: Incontinence brief;Pants Assist for lower body dressing: Maximal Assistance - Patient 25 - 49%    Putting on/Taking off footwear   What is the patient wearing?: Ted hose;Shoes Assist for footwear: Dependent - Patient 0%       Care Tool Toileting Toileting activity    Max A     Care Tool Bed Mobility Roll left and right activity    Min     Sit to lying activity    Mod    Lying to sitting edge of bed activity      Mod   Care Tool Transfers Sit to stand transfer   Sit to stand assist level: Moderate Assistance -  Patient 50 - 74%    Chair/bed transfer   Chair/bed transfer assist level: Moderate Assistance - Patient 50 - 74%     Toilet transfer   Assist Level: Moderate Assistance - Patient 50 - 74%     Care Tool Cognition Expression of Ideas and Wants Expression of Ideas and Wants: Without difficulty (complex and basic) - expresses complex messages without difficulty and with speech that is clear and easy to understand   Understanding Verbal and Non-Verbal Content Understanding Verbal and Non-Verbal Content: Usually understands - understands most conversations, but misses some part/intent of  message. Requires cues at times to understand   Memory/Recall Ability *first 3 days only Memory/Recall Ability *first 3 days only: Current season;Location of own room;That he or she is in a hospital/hospital unit    Refer to Care Plan for [place] 1 OT Short Term Goal 1 (Week 1): Pt will complete toilet transfer with min A during a stand pivot. OT Short Term Goal 2 (Week 1): Pt will don a shirt with verbal cues only. OT Short Term Goal 3 (Week 1): Pt will don pants with mod A or less. OT Short Term Goal 4 (Week 1): Pt will be able to actively use L arm to reach across and wash R arm. OT Short Term Goal 5 (Week 1): Pt will demonstrate improved L side awareness when stepping into the shower only needing 1 verbal cue to fully step to her L..  Recommendations for other services: None    Skilled Therapeutic Intervention ADL ADL Eating: Set up Grooming: Setup Upper Body Bathing: Minimal assistance Where Assessed-Upper Body Bathing: Shower Lower Body Bathing: Moderate assistance Where Assessed-Lower Body Bathing: Shower Upper Body Dressing: Moderate assistance Where Assessed-Upper Body Dressing: Wheelchair Lower Body Dressing: Maximal assistance Where Assessed-Lower Body Dressing: Wheelchair Toileting: Maximal assistance Where Assessed-Toileting: Glass blower/designer:  Moderate assistance Toilet Transfer Method: Stand pivot Toilet Transfer Equipment: Energy manager: Moderate assistance Social research officer, government Method: Radiographer, therapeutic: Transfer tub bench;Grab bars   Pt seen for initial evaluation and ADL training with a focus on L side awareness, balance, L side coordination and functional use. Explained role of OT, pt discussed her PLOF and her goals, reviewed goals, POC and ELOS. Pt worked on sit to stands from [employer] with mod A and then standing in place adding in marching in place with min A.  During transfers, she demonstrates decreased LLE placement awareness. In shower, pt needed A to reach to fully wash R arm and feet.  For dressing, there was not enough time to allow pt to try completely on her own so in a "rushed" manner she needed mod -max A with dressing.  Pt set up in wc with belt alarm on and all needs met.     Discharge Criteria: Patient will be discharged from OT if patient refuses treatment 3 consecutive times without medical reason, if treatment goals not met, if there is a change in medical status, if patient makes no progress towards goals or if patient is discharged from hospital.  The above assessment, treatment plan, treatment alternatives and goals were discussed and mutually agreed upon: by patient  [facility] 06/26/2020, 12:45 PM

## 2020-06-26 NOTE — Progress Notes (Signed)
[facility] PHYSICAL MEDICINE & REHABILITATION PROGRESS NOTE  Subjective/Complaints: Patient seen laying in bed this morning.  She states she slept well overnight.  She is ready to begin therapies.  She is very flat.  ROS: Denies CP, SOB, N/V/D  Objective: Vital Signs: Blood pressure (!) 143/77, pulse 73, temperature 98.5 F (36.9 C), temperature source Oral, resp. rate 18, height 5\' 6"  (1.676 m), weight 101.8 kg, SpO2 97 %. ECHOCARDIOGRAM COMPLETE  Result Date: 06/24/2020    ECHOCARDIOGRAM REPORT   Patient Name:   Cheryl Ortiz Date of Exam: 06/24/2020 Medical Rec #:  [id number]          Height:       66.0 in Accession #:    [id number]         Weight:       226.0 lb Date of Birth:  03/06/1951          BSA:          2.106 m Patient Age:    70 years           BP:           124/74 mmHg Patient Gender: F                  HR:           79 bpm. Exam Location:  Inpatient Procedure: 2D Echo, Cardiac Doppler and Color Doppler Indications:    Stroke  History:        Patient has no prior history of Echocardiogram examinations.                 Risk Factors:Dyslipidemia, Hypertension, Diabetes and Former                 Smoker.  Sonographer:    Clayton Lefort RDCS (AE) Referring Phys: [id number] [place]  1. Left ventricular ejection fraction, by estimation, is 60 to 65%. The left ventricle has normal function. The left ventricle has no regional wall motion abnormalities. There is moderate concentric left ventricular hypertrophy. Left ventricular diastolic parameters are consistent with Grade II diastolic dysfunction (pseudonormalization).  2. Right ventricular systolic function is normal. The right ventricular size is normal.  3. The mitral valve is normal in structure. Mild mitral valve regurgitation. No evidence of mitral stenosis.  4. The aortic valve is normal in structure. Aortic valve regurgitation is not visualized. No aortic stenosis is present.  5. The inferior vena cava is normal in size with  greater than 50% respiratory variability, suggesting right atrial pressure of 3 mmHg. Conclusion(s)/Recommendation(s): No intracardiac source of embolism detected on this transthoracic study. A transesophageal echocardiogram is recommended to exclude cardiac source of embolism if clinically indicated. FINDINGS  Left Ventricle: Left ventricular ejection fraction, by estimation, is 60 to 65%. The left ventricle has normal function. The left ventricle has no regional wall motion abnormalities. The left ventricular internal cavity size was normal in size. There is  moderate concentric left ventricular hypertrophy. Left ventricular diastolic parameters are consistent with Grade II diastolic dysfunction (pseudonormalization). Right Ventricle: The right ventricular size is normal. No increase in right ventricular wall thickness. Right ventricular systolic function is normal. Left Atrium: Left atrial size was normal in size. Right Atrium: Right atrial size was normal in size. Pericardium: There is no evidence of pericardial effusion. Mitral Valve: The mitral valve is normal in structure. Mild mitral valve regurgitation. No evidence of mitral valve stenosis. MV peak gradient, 3.9 mmHg. The mean  mitral valve gradient is 1.0 mmHg. Tricuspid Valve: The tricuspid valve is normal in structure. Tricuspid valve regurgitation is not demonstrated. No evidence of tricuspid stenosis. Aortic Valve: The aortic valve is normal in structure. Aortic valve regurgitation is not visualized. No aortic stenosis is present. Aortic valve mean gradient measures 4.0 mmHg. Aortic valve peak gradient measures 6.9 mmHg. Aortic valve area, by VTI measures 2.45 cm. Pulmonic Valve: The pulmonic valve was normal in structure. Pulmonic valve regurgitation is not visualized. No evidence of pulmonic stenosis. Aorta: The aortic root is normal in size and structure. Venous: The inferior vena cava is normal in size with greater than 50% respiratory variability,  suggesting right atrial pressure of 3 mmHg. IAS/Shunts: No atrial level shunt detected by color flow Doppler.  LEFT VENTRICLE PLAX 2D LVIDd:         4.20 cm  Diastology LVIDs:         2.70 cm  LV e' medial:    6.74 cm/s LV PW:         1.30 cm  LV E/e' medial:  9.0 LV IVS:        1.40 cm  LV e' lateral:   9.03 cm/s LVOT diam:     2.00 cm  LV E/e' lateral: 6.7 LV SV:         63 LV SV Index:   30 LVOT Area:     3.14 cm  RIGHT VENTRICLE            IVC RV Basal diam:  2.70 cm    IVC diam: 2.30 cm RV S prime:     9.30 cm/s TAPSE (M-mode): 1.9 cm LEFT ATRIUM             Index       RIGHT ATRIUM           Index LA diam:        3.90 cm 1.85 cm/m  RA Area:     14.70 cm LA Vol (A2C):   60.2 ml 28.58 ml/m RA Volume:   36.90 ml  17.52 ml/m LA Vol (A4C):   54.8 ml 26.02 ml/m LA Biplane Vol: 61.9 ml 29.39 ml/m  AORTIC VALVE AV Area (Vmax):    2.21 cm AV Area (Vmean):   1.94 cm AV Area (VTI):     2.45 cm AV Vmax:           131.00 cm/s AV Vmean:          92.600 cm/s AV VTI:            0.258 m AV Peak Grad:      6.9 mmHg AV Mean Grad:      4.0 mmHg LVOT Vmax:         92.00 cm/s LVOT Vmean:        57.300 cm/s LVOT VTI:          0.201 m LVOT/AV VTI ratio: 0.78  AORTA Ao Root diam: 3.30 cm Ao Asc diam:  3.10 cm MITRAL VALVE MV Area (PHT): 2.69 cm    SHUNTS MV Area VTI:   2.76 cm    Systemic VTI:  0.20 m MV Peak grad:  3.9 mmHg    Systemic Diam: 2.00 cm MV Mean grad:  1.0 mmHg MV Vmax:       0.99 m/s MV Vmean:      48.0 cm/s MV Decel Time: 282 msec MV E velocity: 60.80 cm/s MV A velocity: 75.40 cm/s MV E/A ratio:  0.81 Ena Dawley MD Electronically signed by Ena Dawley MD Signature Date/Time: 06/24/2020/3:46:30 PM    Final    Recent Labs    06/25/20 0114 06/26/20 0506  WBC 8.1 6.3  HGB 12.3 12.8  HCT 36.8 38.1  PLT 273 262   Recent Labs    06/25/20 0114 06/26/20 0506  NA 137 138  K 3.3* 3.6  CL 104 103  CO2 23 22  GLUCOSE 116* 132*  BUN 15 18  CREATININE 0.95 1.00  CALCIUM 9.2 9.2     Intake/Output Summary (Last 24 hours) at 06/26/2020 0853 Last data filed at 06/25/2020 2013 Gross per 24 hour  Intake 520 ml  Output --  Net 520 ml        Physical Exam: BP (!) 143/77 (BP Location: Left Arm)   Pulse 73   Temp 98.5 F (36.9 C) (Oral)   Resp 18   Ht 5\' 6"  (1.676 m)   Wt 101.8 kg   SpO2 97%   BMI 36.22 kg/m  Constitutional: No distress . Vital signs reviewed. HENT: Normocephalic.  Atraumatic. Eyes: Right gaze preference.  No discharge. Cardiovascular: No JVD.  RRR. Respiratory: Normal effort.  No stridor.  Bilateral clear to auscultation. GI: Non-distended.  BS +. Skin: Warm and dry.  Intact. Psych: Very flat.  Normal behavior. Musc: No edema in extremities.  No tenderness in extremities. Neuro:  Alert Right lean Motor: Left upper extremity: 3+/5 proximal distal Left lower extremity: 2+/5 hip flexion, knee extension, 3/5 ankle dorsiflexion  Assessment/Plan: 1. Functional deficits which require 3+ hours per day of interdisciplinary therapy in a comprehensive inpatient rehab setting.  Physiatrist is providing close team supervision and 24 hour management of active medical problems listed below.  Physiatrist and rehab team continue to assess barriers to discharge/monitor patient progress toward functional and medical goals   Care Tool:  Bathing              Bathing assist       Upper Body Dressing/Undressing Upper body dressing   What is the patient wearing?: Hospital gown only    Upper body assist Assist Level: Moderate Assistance - Patient 50 - 74%    Lower Body Dressing/Undressing Lower body dressing      What is the patient wearing?: Hospital gown only     Lower body assist Assist for lower body dressing: Moderate Assistance - Patient 50 - 74%     Toileting Toileting    Toileting assist Assist for toileting: Total Assistance - Patient < 25%     Transfers Chair/bed transfer  Transfers assist            Locomotion Ambulation   Ambulation assist              Walk 10 feet activity   Assist           Walk 50 feet activity   Assist           Walk 150 feet activity   Assist           Walk 10 feet on uneven surface  activity   Assist           Wheelchair     Assist               Wheelchair 50 feet with 2 turns activity    Assist            Wheelchair 150 feet activity     Assist  Medical Problem List and Plan: 1.  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  Begin CIR evaluations  2.  Antithrombotics: -DVT/anticoagulation: SCDs             -antiplatelet therapy: N/A 3. Pain Management: Tylenol as needed 4. Mood: Provide emotional support             -antipsychotic agents: N/A 5. Neuropsych: This patient is?  Fully capable of making decisions on her own behalf. 6. Skin/Wound Care: Routine skin checks 7. Fluids/Electrolytes/Nutrition: Routine in and outs 8.  Diabetes mellitus type II with hyperglycemia.  Hemoglobin A1c 6.2.  Continue Glucophage 1000 mg daily.    Monitor with increased mobility 9.  Hypertension. HCTZ 25 mg daily  Monitor with increased mobility 10.  History of kidney stones with stent placement 2017 11. Obesity (BMI 36.48): Provide dietary counseling 12.  Hypokalemia  Potassium 3.6 on 2/8  Continue to monitor  LOS: 1 days A FACE TO FACE EVALUATION WAS PERFORMED  Holliday Sheaffer Lorie Phenix 06/26/2020, 8:53 AM

## 2020-06-26 NOTE — Progress Notes (Signed)
[facility] Individual Statement of Services  Patient Name:  Cheryl Ortiz  Date:  06/26/2020  Welcome to the [facility].  Our goal is to provide you with an individualized program based on your diagnosis and situation, designed to meet your specific needs.  With this comprehensive rehabilitation program, you will be expected to participate in at least 3 hours of rehabilitation therapies Monday-Friday, with modified therapy programming on the weekends.  Your rehabilitation program will include the following services:  Physical Therapy (PT), Occupational Therapy (OT), Speech Therapy (ST), 24 hour per day rehabilitation nursing, Neuropsychology, Care Coordinator, Rehabilitation Medicine, Nutrition Services and Pharmacy Services  Weekly team conferences will be held on Wednesday to discuss your progress.  Your Inpatient Rehabilitation Care Coordinator will talk with you frequently to get your input and to update you on team discussions.  Team conferences with you and your family in attendance may also be held.  Expected length of stay: 14-16 days  Overall anticipated outcome: supervision cues  Depending on your progress and recovery, your program may change. Your Inpatient Rehabilitation Care Coordinator will coordinate services and will keep you informed of any changes. Your Inpatient Rehabilitation Care Coordinator's name and contact numbers are listed  below.  The following services may also be recommended but are not provided by the [facility] will be made to provide these services after discharge if needed.  Arrangements include referral to agencies that provide these services.  Your insurance has been verified to be:  Medicare & [employer] Your primary doctor is:  Sharilyn Sites  Pertinent information will be shared with your doctor and your insurance company.  Inpatient Rehabilitation Care Coordinator:  Ovidio Kin, [place] or [phone]  Information discussed with and copy given to patient by: Elease Hashimoto, 06/26/2020, 10:30 AM

## 2020-06-26 NOTE — Evaluation (Signed)
Physical Therapy Assessment and Plan  Patient Details  Name: Cheryl Ortiz MRN: [id number] Date of Birth: 19-Dec-1950  PT Diagnosis: Abnormality of gait, Hemiparesis non-dominant and Impaired cognition Rehab Potential: Good ELOS: 14-16 days   Today's Date: 06/26/2020 PT Individual Time: 1417-1530 PT Individual Time Calculation (min): 73 min    Hospital Problem: Principal Problem:   ICH (intracerebral hemorrhage) ([place]) Active Problems:   Hypokalemia   Essential hypertension   Controlled type 2 diabetes mellitus with hyperglycemia, without long-term current use of insulin ([place])   Past Medical History:  Past Medical History:  Diagnosis Date  . Aneurysm ([place]) 06/2015   brain-small will recheck in 1 year  . Cold   . Cystocele 07/12/2015  . Hyperlipidemia   . Hypertension   . Neuropathy of both feet    tingling/numbness bilateral feet ,rt arm,hands-nerve study scheduled  . Osteoarthritis   . Renal disorder    kidney stones  . Seasonal allergies   . Type 2 diabetes mellitus ([place])    Past Surgical History:  Past Surgical History:  Procedure Laterality Date  . CHOLECYSTECTOMY  1999  . COLONOSCOPY N/A 06/15/2014   Procedure: COLONOSCOPY;  Surgeon: Rogene Houston, MD;  [facility];  Service: Endoscopy;  Laterality: N/A;  1145  . CYSTOSCOPY WITH RETROGRADE PYELOGRAM, URETEROSCOPY AND STENT PLACEMENT Bilateral 08/20/2015   Procedure: CYSTOSCOPY WITH RETROGRADE PYELOGRAM, URETEROSCOPY AND STENT PLACEMENT WITH STONE EXTRACTION WITH BASKET;  Surgeon: Cleon Gustin, MD;  Location[facility];  Service: Urology;  Laterality: Bilateral;  . ESOPHAGEAL DILATION N/A 03/23/2015   Procedure: ESOPHAGEAL DILATION;  Surgeon: Rogene Houston, MD;  [facility];  Service: Endoscopy;  Laterality: N/A;  . ESOPHAGOGASTRODUODENOSCOPY N/A 03/23/2015   Procedure: ESOPHAGOGASTRODUODENOSCOPY (EGD);  Surgeon: Rogene Houston, MD;  [facility];  Service:  Endoscopy;  Laterality: N/A;  8:55 - moved to 12:35 - Ann to notify pt  . FOOT SURGERY Bilateral   . HOLMIUM LASER APPLICATION Bilateral 01/18/1193   Procedure: HOLMIUM LASER APPLICATION;  Surgeon: Cleon Gustin, MD;  Location[facility];  Service: Urology;  Laterality: Bilateral;  . 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  Per chart review independent prior to admission.  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  1 level home with level entry.  Presented 06/22/2020 with acute onset of left-sided weakness.  Cranial CT scan showed an area of hemorrhage within the anterior medial right frontal lobe.  Adjacent small amount of blood to the left side of the falx could be within the left frontal lobe or subarachnoid blood.  No mass-effect or midline shift.  CT angiogram of head and neck no sign of high flow vascular malformation in the region of the right frontal hemorrhage.  No signs of venous thrombosis.  MRI of the brain right frontal and smaller left frontal intraparenchymal hemorrhage likely small in size when comparing across modalities.  Small volume of adjacent hemorrhage along the falx and suspected trace right frontal subarachnoid hemorrhage.  No surrounding restricted diffusion to suggest hemorrhagic transformation or infarct.  No visible lesion noted.  Echocardiogram with ejection fraction of 60 to  65% no wall motion abnormalities grade 2 diastolic dysfunction.  Neurology follow-up close monitoring of blood pressure.  Tolerating a regular consistency diet.  Admitted to CIR with left sided weakness, impaired mobility and ADLs,  constipation, joint pain, and myalgias. Patient transferred to CIR on 06/25/2020 .   Patient currently requires mod with mobility secondary to muscle weakness, motor apraxia and decreased coordination, decreased attention to left, left side neglect and decreased motor planning and decreased initiation, decreased attention, decreased problem solving, decreased memory and delayed processing.  Prior to hospitalization, patient was independent  with mobility and lived with Family in a House home.  Home access is 1Level entry,Stairs to enter.  Patient will benefit from skilled PT intervention to maximize safe functional mobility, minimize fall risk and decrease caregiver burden for planned discharge home with 24 hour supervision.  Anticipate patient will benefit from follow up [place] at discharge.  PT - End of Session Activity Tolerance: Tolerates 30+ min activity with multiple rests Endurance Deficit: Yes Endurance Deficit Description: needs rest breaks PT Assessment Rehab Potential (ACUTE/IP ONLY): Good PT Barriers to Discharge: Incontinence PT Patient demonstrates impairments in the following area(s): Balance;Sensory;Motor;Endurance;Perception;Safety PT Transfers Functional Problem(s): Bed Mobility;Bed to Chair;Car;Furniture PT Locomotion Functional Problem(s): Ambulation;Stairs PT Plan PT Intensity: Minimum of 1-2 x/day ,45 to 90 minutes PT Frequency: 5 out of 7 days PT Duration Estimated Length of Stay: 14-16 days PT Treatment/Interventions: Ambulation/gait training;DME/adaptive equipment instruction;Neuromuscular re-education;Stair training;UE/LE Strength taining/ROM;Wheelchair propulsion/positioning;UE/LE Coordination activities;Therapeutic Activities;Balance/vestibular training;Discharge planning;Cognitive remediation/compensation;Disease management/prevention;Functional mobility training;Patient/family education;Therapeutic Exercise;Splinting/orthotics;Visual/perceptual remediation/compensation PT  Transfers Anticipated Outcome(s): supervision PT Locomotion Anticipated Outcome(s): supervision with LRAD PT Recommendation Follow Up Recommendations: Home health PT;24 hour supervision/assistance Patient destination: Home Equipment Recommended: Rolling walker with 5" wheels   PT Evaluation Precautions/Restrictions Precautions Precautions: Fall General   Vital Signs  Pain Pain Assessment Pain Score: 0-No pain Home Living/Prior Functioning Home Living Available Help at Discharge: Family;Available PRN/intermittently Type of Home: House Home Access: Level entry;Stairs to enter Entrance Stairs-Number of Steps: 1 Home Layout: One level Bathroom Shower/Tub: Chiropodist: Standard Additional Comments: Pt's brother, who is blind, lives with her.  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 Vision/Perception  Perception Perception: Impaired Inattention/Neglect: Does not attend to left visual field;Does not attend to left side of body Praxis Praxis: Impaired Praxis Impairment Details: Motor planning;Initiation  Cognition Overall Cognitive Status: Impaired/Different from baseline Arousal/Alertness: Awake/alert Orientation Level: Oriented X4 Attention: Selective Focused Attention: Appears intact Sustained Attention: Appears intact Selective Attention: Impaired Memory: Impaired Memory Impairment: Decreased short term memory Problem Solving: Impaired Safety/Judgment: Appears intact Comments: slow cognitive processing Sensation Sensation Light Touch: Appears Intact Proprioception: Impaired by gross assessment Additional Comments: decreased  proprioception on L side Coordination Gross Motor Movements are Fluid and Coordinated: No Fine Motor Movements are Fluid and Coordinated: No Heel Shin Test: unable to coordinate to perform on L Motor  Motor Motor: Hemiplegia;Motor apraxia Motor - Skilled Clinical Observations: L side weakness, decresed attention, difficulty sequencing, motor planning   Trunk/Postural Assessment  Cervical Assessment Cervical Assessment: Within Functional Limits Thoracic Assessment Thoracic Assessment: Exceptions to [facility] (increased kyphosis) Lumbar Assessment Lumbar Assessment: Exceptions to [facility] (posterior pelvic tilt) Postural Control Postural Control: Deficits on evaluation Trunk Control: leans to L and posterior needing cues throughout  to correct/attend Righting Reactions: delayed significantly  Balance Balance Balance Assessed: Yes Static Sitting Balance Static Sitting - Level of Assistance: 4: Min assist Static Sitting - Comment/# of Minutes: seated EOB x 5 minutes Dynamic Sitting Balance Dynamic Sitting - Balance Support: Feet supported;During functional activity;No upper extremity supported;Right upper extremity supported Dynamic Sitting - Level of Assistance: 4: Min assist Sitting balance - Comments: lifting foot to don pants, reaching for pants, unaware of LOB to L and posterior Static Standing Balance Static Standing - Level of Assistance: 4: Min assist Static Standing - Comment/# of Minutes: with at least 1 UE support Dynamic Standing Balance Dynamic Standing - Level of Assistance: 3: Mod assist Dynamic Standing - Comments: pulling up pants Extremity Assessment      RLE Assessment RLE Assessment: Within Functional Limits LLE Assessment LLE Assessment: Exceptions to [facility] Active Range of Motion (AROM) Comments: WFL General Strength Comments: hip flexion 4-/5, knee extension 4-/5, ankle DF 4-/5  Care Tool Care Tool Bed Mobility Roll left and right activity   Roll left and right  assist level: Moderate Assistance - Patient 50 - 74%    Sit to lying activity        Lying to sitting edge of bed activity   Lying to sitting edge of bed assist level: Moderate Assistance - Patient 50 - 74%     Care Tool Transfers Sit to stand transfer   Sit to stand assist level: Moderate Assistance - Patient 50 - 74%    Chair/bed transfer   Chair/bed transfer assist level: Moderate Assistance - Patient 50 - 74%     Toilet transfer   Assist Level: Moderate Assistance - Patient 50 - 74%    Car transfer Car transfer activity did not occur: Safety/medical concerns        Care Tool Locomotion Ambulation   Assist level: Moderate Assistance - Patient 50 - 74% Assistive device: Walker-rolling Max distance: 75  Walk 10 feet activity   Assist level: Moderate Assistance - Patient - 50 - 74% Assistive device: Walker-rolling   Walk 50 feet with 2 turns activity   Assist level: Moderate Assistance - Patient - 50 - 74% Assistive device: Walker-rolling  Walk 150 feet activity Walk 150 feet activity did not occur: Safety/medical concerns      Walk 10 feet on uneven surfaces activity Walk 10 feet on uneven surfaces activity did not occur: Safety/medical concerns      Stairs Stair activity did not occur: Safety/medical concerns        Walk up/down 1 step activity Walk up/down 1 step or curb (drop down) activity did not occur: Safety/medical concerns     Walk up/down 4 steps activity did not occuR: Safety/medical concerns  Walk up/down 4 steps activity      Walk up/down 12 steps activity Walk up/down 12 steps activity did not occur: Safety/medical concerns      Pick up small objects from floor Pick up small object from the floor (from standing position) activity did not occur: Safety/medical concerns      Wheelchair Will patient use wheelchair at discharge?: No     Wheelchair assist level: Dependent - Patient 0% Max wheelchair distance: 200  Wheel 50 feet with 2 turns  activity   Assist Level: Dependent - Patient 0%  Wheel 150 feet activity   Assist Level: Dependent - Patient 0%    Refer to Care Plan for Long Term Goals  SHORT TERM GOAL WEEK 1 PT Short Term Goal 1 (Week  1): Patient to perform supine to sit with min A after cueing PT Short Term Goal 2 (Week 1): Patient to perform bed<>chair transfers with min A with cues PT Short Term Goal 3 (Week 1): Patient to initiate stair training. PT Short Term Goal 4 (Week 1): Patient to propel w/c 64' with min A for L side awareness.  Recommendations for other services: None   Skilled Therapeutic Intervention Patient in supine and daughter present but leaving.  Reports she had her pants taken off.  Patient supine to sit with mod A, increased time and max instructional cues.  Patient seated EOB noted L lateral lean and cues to reach to foot board with R hand for balance.  Total A to don pants and shoes at EOB.  Patient sit to stand mod A with increased time to RW and max A to pull up pants.  Patient returned to seated EOB.  W/c brought to bedside on L and patient instructed to transfer to w/c, but no initiation after several minutes and cues/attempts to assist.  So placed on R side and pt transferred with mod A.  While in w/c noted pt with incontinent BM.  Assisted to bathroom in w/c.  Assisted with mod A using RW and increased time to step to toilet.  Max A to doff pants and brief.  Seated on toilet for continent void.  Total A for hygiene as pt perseverating on unrolling toilet paper.  Donned slipper socks and pants and clean brief seated on toilet with total A.  Sit to stand min A using grabbar and max A to don pants and brief in standing.  Patient stand step to w/c with grabbar and min A.  Assisted in w/c to gym.  Obtained 20" w/c for pt as unable to fit in current w/c.  Sit to stand with increased time as pt distracted with clothes pin activity to her R.  Needed hand over hand to place on armrest and cues for sit to  stand.  Able to stand with min a to RW and ambulated x 75 with RW with min A to mod A when needing to avoid obstacles on L Side.  Assisted pt to room and left in w/c with alarm belt active and call bell, needs in reach.  Mobility Bed Mobility Bed Mobility: Supine to Sit;Rolling Right Rolling Right: Minimal Assistance - Patient > 75% Supine to Sit: Moderate Assistance - Patient 50-74% Transfers Transfers: Sit to Stand Sit to Stand: Moderate Assistance - Patient 50-74% Transfer (Assistive device): Rolling walker Locomotion  Gait Ambulation: Yes Gait Assistance: Minimal Assistance - Patient > 75%;Moderate Assistance - Patient 50-74% Gait Distance (Feet): 75 Feet Assistive device: Rolling walker Gait Assistance Details: Verbal cues for safe use of DME/AE;Verbal cues for precautions/safety Gait Assistance Details: cues to maneuver around obstacles safely especially on L side Gait Gait: Yes Gait Pattern: Impaired Gait Pattern: Step-to pattern;Decreased stride length;Decreased dorsiflexion - left;Decreased hip/knee flexion - left Stairs / Additional Locomotion Stairs: No Wheelchair Mobility Wheelchair Mobility: No   Discharge Criteria: Patient will be discharged from PT if patient refuses treatment 3 consecutive times without medical reason, if treatment goals not met, if there is a change in medical status, if patient makes no progress towards goals or if patient is discharged from hospital.  The above assessment, treatment plan, treatment alternatives and goals were discussed and mutually agreed upon: by patient  Jamison Oka, PT 06/26/2020, 5:55 PM

## 2020-06-26 NOTE — Evaluation (Signed)
Speech Language Pathology Assessment and Plan  Patient Details  Name: Cheryl Ortiz MRN: [id number] Date of Birth: April 10, 1951  SLP Diagnosis: Speech and Language deficits;Cognitive Impairments  Rehab Potential: Good ELOS: 2 weeks    Today's Date: 06/26/2020 SLP Individual Time: 5852-7782    Hospital Problem: Principal Problem:   ICH (intracerebral hemorrhage) ([place]) Active Problems:   Hypokalemia   Essential hypertension   Controlled type 2 diabetes mellitus with hyperglycemia, without long-term current use of insulin ([place])  Past Medical History:  Past Medical History:  Diagnosis Date  . 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 Cold   . Cystocele 07/12/2015  . Hyperlipidemia   . Hypertension   . Neuropathy of both feet    tingling/numbness bilateral feet ,rt arm,hands-nerve study scheduled  . Osteoarthritis   . Renal disorder    kidney stones  . Seasonal allergies   . Type 2 diabetes mellitus ([place])    Past Surgical History:  Past Surgical History:  Procedure Laterality Date  . CHOLECYSTECTOMY  1999  . COLONOSCOPY N/A 06/15/2014   Procedure: COLONOSCOPY;  Surgeon: Rogene Houston, MD;  [facility];  Service: Endoscopy;  Laterality: N/A;  1145  . CYSTOSCOPY WITH RETROGRADE PYELOGRAM, URETEROSCOPY AND STENT PLACEMENT Bilateral 08/20/2015   Procedure: CYSTOSCOPY WITH RETROGRADE PYELOGRAM, URETEROSCOPY AND STENT PLACEMENT WITH STONE EXTRACTION WITH BASKET;  Surgeon: Cleon Gustin, MD;  Location: [facility];  Service: Urology;  Laterality: Bilateral;  . ESOPHAGEAL DILATION N/A 03/23/2015   Procedure: ESOPHAGEAL DILATION;  Surgeon: Rogene Houston, MD;  [facility];  Service: Endoscopy;  Laterality: N/A;  . ESOPHAGOGASTRODUODENOSCOPY N/A 03/23/2015   Procedure: ESOPHAGOGASTRODUODENOSCOPY (EGD);  Surgeon: Rogene Houston, MD;  [facility];  Service: Endoscopy;  Laterality: N/A;  8:55 - moved to 12:35 -  Ann to notify pt  . FOOT SURGERY Bilateral   . HOLMIUM LASER APPLICATION Bilateral 08/19/3534   Procedure: HOLMIUM LASER APPLICATION;  Surgeon: Cleon Gustin, MD;  Location: [facility];  Service: Urology;  Laterality: Bilateral;  . 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  Per chart review independent prior to admission.  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  1 level home with level entry.  Presented 06/22/2020 with acute onset of left-sided weakness.  Cranial CT scan showed an area of hemorrhage within the anterior medial right frontal lobe.  Adjacent small amount of blood to the left side of the falx could be within the left frontal lobe or subarachnoid blood.  No mass-effect or midline shift.  CT angiogram of head and neck no sign of high flow vascular malformation in the region of the right frontal hemorrhage.  No signs of venous thrombosis.  MRI of the brain right frontal and smaller left frontal intraparenchymal hemorrhage likely small in size when comparing across modalities.  Small volume of adjacent hemorrhage along the falx and suspected trace right frontal subarachnoid hemorrhage.  No surrounding restricted diffusion to suggest hemorrhagic transformation or infarct.  No visible lesion noted.  Echocardiogram with ejection fraction of 60 to 65% no wall  motion abnormalities grade 2 diastolic dysfunction.  Neurology follow-up close monitoring of blood pressure.  Tolerating a regular consistency diet.  Admitted to CIR 06/25/20 with left sided weakness, impaired mobility and ADLs, constipation, joint pain, and  myalgias. SLP evaluation was administered on 06/26/20 with results as follows:  Pt presents with cognitive deficits having a moderate impact on her current level of functioning. Although she endorsed feeling "slow and fuzzy" in regards to cognition, she was unable to identify specific deficits independently. Pt's daughter was present, and she endorsed pragmatic and cognitive deficits noted by SLP in evaluation. Pt scored 17/30 (WNL = 26 or >) on MOCA Version 7.1 assessment. She also demonstrated functional problem solving and attention deficits outside of standardized assessment measure (ex: when cleaning glasses and functional conversational topics). In addition, although no dysarthria evidenced, pt demonstrated pragmatic communication deficits marked by poor eye contact, low vocal intensity, and monotone voice, which impacts her speech intelligibility in conversation. She also exhibited slow cognitive processing throughout evaluation, and reduced verbal and task initiation. Question left visual inattention, but this was difficult to accurately assess given the lack of eye contact made during session regardless of whether SLP was on pt's left or right side.   Recommend pt receive skilled ST services to address cognitive communication deficits as described above in order to maximize her functional communication, safety, and independence prior to discharge.   Skilled Therapeutic Interventions          Cognitive-linguistic evaluation was administered and results were reviewed with pt and her daughter (please see above for details regarding the results).   SLP Assessment  Patient will need skilled [place] [facility] during CIR admission    Recommendations  Patient destination: Home Follow up Recommendations: 24 hour supervision/assistance;Outpatient SLP;Home Health SLP Equipment Recommended: None recommended by SLP    SLP Frequency 3 to 5 out of 7 days   SLP Duration  SLP  Intensity  SLP Treatment/Interventions 2 weeks  Minumum of 1-2 x/day, 30 to 90 minutes  Cognitive remediation/compensation;Cueing hierarchy;Functional tasks;Internal/external aids;Speech/Language facilitation;Patient/family education;Therapeutic Activities    Pain Pain Assessment Pain Scale: 0-10 Pain Score: 0-No pain  Prior Functioning Type of Home: House  Lives With: Family Available Help at Discharge: Family;Available PRN/intermittently Vocation: Part time employment  SLP Evaluation Cognition Overall Cognitive Status: Impaired/Different from baseline Arousal/Alertness: Awake/alert Orientation Level: Oriented X4 Attention: Selective Focused Attention: Appears intact Sustained Attention: Appears intact Selective Attention: Impaired Selective Attention Impairment: Functional complex;Verbal basic Memory: Impaired Memory Impairment: Decreased short term memory Decreased Short Term Memory: Verbal complex;Functional complex Immediate Memory Recall: Sock;Blue;Bed Memory Recall Sock: Without Cue Memory Recall Blue: Without Cue Memory Recall Bed: Without Cue Awareness: Impaired Awareness Impairment: Emergent impairment Problem Solving: Impaired Problem Solving Impairment: Functional basic;Verbal basic Safety/Judgment: Appears intact Comments: slow cognitive processing  Comprehension Auditory Comprehension Overall Auditory Comprehension: Appears within functional limits for tasks assessed Conversation: Simple Visual Recognition/Discrimination Discrimination: Within Function Limits Reading Comprehension Reading Status: Not tested Expression Expression Primary Mode of Expression: Verbal Verbal Expression Overall Verbal Expression: Impaired Pragmatics: Impairment Impairments: Abnormal affect;Eye contact;Monotone Interfering Components: Attention;Other (comment) (processing speed) Non-Verbal Means of Communication: Not applicable Written Expression Dominant Hand:  Right Oral Motor Oral Motor/Sensory Function Overall Oral Motor/Sensory Function: Within functional limits Motor Speech Overall Motor Speech: Appears within functional limits for tasks assessed Intelligibility: Intelligibility reduced Word: 75-100% accurate Phrase: 75-100% accurate Sentence: 75-100% accurate Conversation: 75-100% accurate Motor Speech Errors: Not applicable  Care Tool Care Tool Cognition Expression of Ideas and Wants  Expression of Ideas and Wants: Some difficulty - exhibits some difficulty with expressing needs and ideas (e.g, some words or finishing thoughts) or speech is not clear   Understanding Verbal and Non-Verbal Content Understanding Verbal and Non-Verbal Content: Usually understands - understands most conversations, but misses some part/intent of message. Requires cues at times to understand   Memory/Recall Ability *first 3 days only Memory/Recall Ability *first 3 days only: Current season;Location of own room;That he or she is in a hospital/hospital unit     PMSV Assessment  PMSV Trial Intelligibility: Intelligibility reduced Word: 75-100% accurate Phrase: 75-100% accurate Sentence: 75-100% accurate Conversation: 75-100% accurate   Short Term Goals: Week 1: SLP Short Term Goal 1 (Week 1): Pt will self-monitor her vocal intensity, tone, and eye contact (pragmatic communication skills) to increase intelligibility to 90% in conversaton with Min A cues. SLP Short Term Goal 2 (Week 1): Pt will demonstrate ability to problme solving basic to mildly complex tasks with Min A verbal/visual cues. SLP Short Term Goal 3 (Week 1): Pt will selectively attend to functional tasks with Min A verbal/visual cues for redirection. SLP Short Term Goal 4 (Week 1): Pt will demonstrate awareness of functional errors with Min A cues.  Refer to Care Plan for Long Term Goals  Recommendations for other services: Neuropsych  Discharge Criteria: Patient will be discharged from  SLP if patient refuses treatment 3 consecutive times without medical reason, if treatment goals not met, if there is a change in medical status, if patient makes no progress towards goals or if patient is discharged from hospital.  The above assessment, treatment plan, treatment alternatives and goals were discussed and mutually agreed upon: by patient  Arbutus Leas 06/26/2020, 12:51 PM

## 2020-06-26 NOTE — Progress Notes (Signed)
Inpatient Rehabilitation Care Coordinator Assessment and Plan Patient Details  Name: Cheryl Ortiz MRN: [id number] Date of Birth: 1951-04-18  Today's Date: 06/26/2020  Hospital Problems: Principal Problem:   ICH (intracerebral hemorrhage) ([place]) Active Problems:   Hypokalemia   Essential hypertension   Controlled type 2 diabetes mellitus with hyperglycemia, without long-term current use of insulin ([place])  Past Medical History:  Past Medical History:  Diagnosis Date  . 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 Cold   . Cystocele 07/12/2015  . Hyperlipidemia   . Hypertension   . Neuropathy of both feet    tingling/numbness bilateral feet ,rt arm,hands-nerve study scheduled  . Osteoarthritis   . Renal disorder    kidney stones  . Seasonal allergies   . Type 2 diabetes mellitus ([place])    Past Surgical History:  Past Surgical History:  Procedure Laterality Date  . CHOLECYSTECTOMY  1999  . COLONOSCOPY N/A 06/15/2014   Procedure: COLONOSCOPY;  Surgeon: Rogene Houston, MD;  [facility];  Service: Endoscopy;  Laterality: N/A;  1145  . CYSTOSCOPY WITH RETROGRADE PYELOGRAM, URETEROSCOPY AND STENT PLACEMENT Bilateral 08/20/2015   Procedure: CYSTOSCOPY WITH RETROGRADE PYELOGRAM, URETEROSCOPY AND STENT PLACEMENT WITH STONE EXTRACTION WITH BASKET;  Surgeon: Cleon Gustin, MD;  Location: [facility];  Service: Urology;  Laterality: Bilateral;  . ESOPHAGEAL DILATION N/A 03/23/2015   Procedure: ESOPHAGEAL DILATION;  Surgeon: Rogene Houston, MD;  [facility];  Service: Endoscopy;  Laterality: N/A;  . ESOPHAGOGASTRODUODENOSCOPY N/A 03/23/2015   Procedure: ESOPHAGOGASTRODUODENOSCOPY (EGD);  Surgeon: Rogene Houston, MD;  [facility];  Service: Endoscopy;  Laterality: N/A;  8:55 - moved to 12:35 - Ann to notify pt  . FOOT SURGERY Bilateral   . HOLMIUM LASER APPLICATION Bilateral 01/19/2670   Procedure: HOLMIUM LASER  APPLICATION;  Surgeon: Cleon Gustin, MD;  Location: [facility];  Service: Urology;  Laterality: Bilateral;  . VAGINAL HYSTERECTOMY  1984   Social History:  reports that she quit smoking about 22 years ago. Her smoking use included cigarettes. She has a 6.00 pack-year smoking history. She has never used smokeless tobacco. She reports previous alcohol use. She reports that she does not use drugs.  Family / Support Systems Marital Status: Widow/Widower Patient Roles: Parent,Caregiver,Other (Comment) (employee) Children: Gerald Dexter jeffries-daughter [phone] Other Supports: Marvetta Gibbons in-law [phone] oldest brother to assist also Anticipated Caregiver: Daughter, older brother and sister in-law. Daughter may take FMLA Ability/Limitations of Caregiver: Supervision-min assist level Caregiver Availability: 24/7 (Aware will need 24/7 care when discharged from rehab) Family Dynamics: Close knit with all fmaily, pt's other brother lives with her and she assists with his care-he is blind and deaf. Family will pull together to assist each of them. Brother is pretty much self sufficient  Social History Preferred language: English Religion: Christian Cultural Background: No issues Education: HS Read: Yes Write: Yes Employment Status: Employed Date Retired/Disabled/Unemployed: semi retired Name of Employer: [employer] in [place] once day a week-Part time Return to Work Plans: Probably not now will fully retire Public relations account executive Issues: No issues Guardian/Conservator: None-according to MD pt is capable of making her own decisions while here. Daughter plans to be very involved while here   Abuse/Neglect Abuse/Neglect Assessment Can Be Completed: Yes Physical Abuse: Denies Verbal Abuse: Denies Sexual Abuse: Denies Exploitation of patient/patient's resources: Denies Self-Neglect: Denies  Emotional Status Pt's affect, behavior and adjustment status: Pt  is somewhat flat answers  questions but no emotion behind it. She has always been independent and cared for others. She is not used to being the person needing care. She wants to get back to her independent level. Recent Psychosocial Issues: other health issues-caregiver for blind brother Psychiatric History: No history she has a flat affect from [place] and some attention issues. She reports no history of any issues. May benefit from seeing neuro-psych while here. Will get input from team Substance Abuse History: No issues-quit tobacco and ETOH years ago  Patient / Family Perceptions, Expectations & Goals Pt/Family understanding of illness & functional limitations: Pt and daughter can explain her stroke and deficits. Her left side is affected and she is happy it is not her right since she is right hand dominate. Both have spoken with the MD and feel they have a good understanding of her plan going forward. Daughter plans to being here daily to support Mom and help with Uncle Premorbid pt/family roles/activities: Mom, caregiver, sibling, church member, employee, etc Anticipated changes in roles/activities/participation: resume Pt/family expectations/goals: Pt states: " I want to do for myself, I don't like asking others for help."  Daughter states: " We will do what is needed and are trying to get some of the care off of her plate."  [employer]: Other (Comment) (Been to [facility] in the past) Premorbid Home Care/DME Agencies: None Transportation available at discharge: Self now will need to rely upon family members Resource referrals recommended: Neuropsychology  Discharge Planning Living Arrangements: Other relatives Support Systems: Children,Other relatives,Friends/neighbors,Church/faith community Type of Residence: Private residence Insurance Resources: [employer] (specify) ([place]) Financial Screen Referred: No Living Expenses: Own Money Management:  Patient Does the patient have any problems obtaining your medications?: No Home Management: Patient did the home management, will need assist now Patient/Family Preliminary Plans: Return home with brother who is blind with daughter, sister in-law and older brother assisting. Daughter to bring in her FMLA forms to be completed. Made aware will need 24/7 care at discharge. Encouraged daughter to talk with family members to see who is available to assist with care. Care Coordinator Anticipated Follow Up Needs: HH/OP  Clinical Impression Pleasant female who is flat but willing to answer questions and work with therapists. Daughter is at bedside and providing support. Aware team evaluating and setting goals today. Will update tomorrow after conference to let know ELOS and goals set. Daughter to bring in FMLA forms to be completed. May benefit from seeing neuro-psych while here. Daughter working on putting in place assist for her blind uncle at home. He can stay alone but will need someone checking on to make sure safe.  Elease Hashimoto 06/26/2020, 10:27 AM

## 2020-06-27 DIAGNOSIS — I611 Nontraumatic intracerebral hemorrhage in hemisphere, cortical: Secondary | ICD-10-CM | POA: Diagnosis not present

## 2020-06-27 DIAGNOSIS — R7309 Other abnormal glucose: Secondary | ICD-10-CM

## 2020-06-27 DIAGNOSIS — I952 Hypotension due to drugs: Secondary | ICD-10-CM

## 2020-06-27 DIAGNOSIS — E1165 Type 2 diabetes mellitus with hyperglycemia: Secondary | ICD-10-CM | POA: Diagnosis not present

## 2020-06-27 DIAGNOSIS — Z8679 Personal history of other diseases of the circulatory system: Secondary | ICD-10-CM | POA: Diagnosis not present

## 2020-06-27 LAB — GLUCOSE, CAPILLARY
Glucose-Capillary: 121 mg/dL — ABNORMAL HIGH (ref 70–99)
Glucose-Capillary: 122 mg/dL — ABNORMAL HIGH (ref 70–99)
Glucose-Capillary: 98 mg/dL (ref 70–99)

## 2020-06-27 MED ORDER — HYDROCHLOROTHIAZIDE 12.5 MG PO CAPS
12.5000 mg | ORAL_CAPSULE | Freq: Every day | ORAL | Status: DC
  Administered 2020-06-28 – 2020-07-02 (×5): 12.5 mg via ORAL
  Filled 2020-06-27 (×5): qty 1

## 2020-06-27 NOTE — Progress Notes (Signed)
Physical Therapy Session Note  Patient Details  Name: Cheryl Ortiz MRN: [id number] Date of Birth: 06-Jun-1950  Today's Date: 06/27/2020 PT Individual Time: 1749-4496 PT Individual Time Calculation (min): 25 min   Short Term Goals: Week 1:  PT Short Term Goal 1 (Week 1): Patient to perform supine to sit with min A after cueing PT Short Term Goal 2 (Week 1): Patient to perform bed<>chair transfers with min A with cues PT Short Term Goal 3 (Week 1): Patient to initiate stair training. PT Short Term Goal 4 (Week 1): Patient to propel w/c 59' with min A for L side awareness.  Skilled Therapeutic Interventions/Progress Updates:    Pt received sitting upright in w/c, awake and agreeable to therapy. No reports of pain. Flat affect and limited eye contact with therapist during session. Noted delayed initiation and processing during session. Pt noted to be on her phone and repetitively tapping the same button. When asked what she was doing, she said she was inviting all of her friends to her family reunion, unable to confirm. W/c transport to ortho gym for time management. Performed NMR with BITS system, performing single target with L bias for improving L inattention. She did this in standing with CGA balance and RW support but required mod cues for attending to L part of the screen. Gait training x136ft with CGA/minA and RW within hallways, focused on straight path and limiting deviations to the R as well as attending to the L. Pt returned the remaining distance back to her room and she remained seated in w/c with safety belt alarm on and needs withinreach.  Therapy Documentation Precautions:  Precautions Precautions: Fall Restrictions Weight Bearing Restrictions: No  Therapy/Group: Individual Therapy  Kyo Cocuzza P Shalika Arntz PT 06/27/2020, 3:35 PM

## 2020-06-27 NOTE — Progress Notes (Signed)
Physical Therapy Session Note  Patient Details  Name: Cheryl Ortiz MRN: [id number] Date of Birth: 10-12-1950  Today's Date: 06/27/2020 PT Individual Time: 0915-1000 PT Individual Time Calculation (min): 45 min   Short Term Goals: Week 1:  PT Short Term Goal 1 (Week 1): Patient to perform supine to sit with min A after cueing PT Short Term Goal 2 (Week 1): Patient to perform bed<>chair transfers with min A with cues PT Short Term Goal 3 (Week 1): Patient to initiate stair training. PT Short Term Goal 4 (Week 1): Patient to propel w/c 60' with min A for L side awareness.  Skilled Therapeutic Interventions/Progress Updates:   Pt received supine in bed and agreeable to PT. Supine>sit transfer with mod assist and cues doe sequencing and posture once EOB.   Sit<>stand and stand pivot transfer with min assist overall and moderate cues for sequencing to initiate anterior weight shift prior to stand.   Gait training with RW x 78f and to weave through 6 cones. Min assist throughout and moderate cues for AD management in turns as well as awareness of the L side.   Dynamic standing balance while engaged in congnitive task of peg board puzzle. Pt completed low difficulty with supervision assist and only min cues and moderate cues for problem solving for moderate difficulty puzzle. On overt LOB, but intermittent cues for wide BOS.   [facility] returned to room and [facility] and all needs met.         Therapy Documentation Precautions:  Precautions Precautions: Fall Restrictions Weight Bearing Restrictions: No    Pain: Pain Assessment Pain Scale: 0-10 Pain Score: 0-No pain   Therapy/Group: Individual Therapy  ALorie Phenix2/01/2021, 5:30 PM

## 2020-06-27 NOTE — Progress Notes (Addendum)
Physical Therapy Session Note  Patient Details  Name: Cheryl Ortiz MRN: [id number] Date of Birth: 10-27-1950  Today's Date: 06/27/2020 PT Individual Time: 9798-9211 PT Individual Time Calculation (min): 43 min   Short Term Goals: Week 1:  PT Short Term Goal 1 (Week 1): Patient to perform supine to sit with min A after cueing PT Short Term Goal 2 (Week 1): Patient to perform bed<>chair transfers with min A with cues PT Short Term Goal 3 (Week 1): Patient to initiate stair training. PT Short Term Goal 4 (Week 1): Patient to propel w/c 72' with min A for L side awareness.  Skilled Therapeutic Interventions/Progress Updates:    Patient seated in w/c in room pressing through channels on TV.  Able to accurately report activities performed in first PT session of the day.  Patient assisted in w/c to therapy gym.  Performed sit to stand with min A.  Patient ambulated with RW and min A x 120' then negotiated 4 steps with bilateral rails and min to mod A cues for sequencing as at times lifting L to ascend step and not getting foot up high enough or far enough on step.  Cues to lower L first and mod A x 1 due to uncontrolled descent.  Patient ambulated to ortho gym x 160' and attempted car transfer to sedan height.    Sat with uncontrolled descent onto seat despite cues and assist for using R UE on seat back to lower.  Several strategies and cues for getting pt to scoot around or back into seat or lift leg into car.  So did not continue attempts.  Attempted sit to stand from low car seat x 2, but pt not initiating so elevated height and pt stood with min A.   Negotiated ramp with RW and min A cues for safety.  Patient ambulated back to general gym with RW and min A.  Assisted in w/c to room and left seated with belt alarm active and needs in reach.   Therapy Documentation Precautions:  Precautions Precautions: Fall Restrictions Weight Bearing Restrictions: No   Pain: Pain Assessment Pain Score:  0-No pain   Therapy/Group: Individual Therapy  Reginia Naas  [place], PT 06/27/2020, 12:16 PM

## 2020-06-27 NOTE — Patient Care Conference (Signed)
Inpatient RehabilitationTeam Conference and Plan of Care Update Date: 06/27/2020   Time: 11:34 AM    Patient Name: Cheryl Ortiz      Medical Record Number: [id number]  Date of Birth: [birth year] Sex: Female         Room/Bed: 4W14C/4W14C-02 Payor Info: Payor: MEDICARE / Plan: MEDICARE PART A AND B / Product Type: *No Product type* /    Admit Date/Time:  06/25/2020  3:55 PM  Primary Diagnosis:  ICH (intracerebral hemorrhage) [facility])  Hospital Problems: Principal Problem:   ICH (intracerebral hemorrhage) ([place]) Active Problems:   Hypokalemia   Essential hypertension   Controlled type 2 diabetes mellitus with hyperglycemia, without long-term current use of insulin ([place])   History of hypertension   Hypotension due to drugs   Labile blood glucose    Expected Discharge Date: Expected Discharge Date: 07/12/20  Team Members Present: Physician leading conference: Dr. Delice Lesch Care Coodinator Present: Dorien Chihuahua, RN, BSN, CRRN;Becky Dupree, LCSW Nurse Present: Suella Grove, RN PT Present: Ginnie Smart, PT OT Present: Meriel Pica, OT SLP Present: Jettie Booze, CF-SLP PPS Coordinator present : Gunnar Fusi, SLP     Current Status/Progress Goal Weekly Team Focus  Bowel/Bladder   continent of bowel occasional incontinence of urine LBM 02/08  pt will be continent of B/B with normal bowel pattern  toilet q2-3H and prn   Swallow/Nutrition/ Hydration             ADL's   mod A overall, LUE weak with mild tone, decreased standing balance, slow processing  Supervision  ADL training, functional mobility, balance, LUE NMR, pt/family education, cognition   Mobility   mod assist bed mobility. min assist sit<>stand and stand pivot trasnfers, as well as gait up to 19f with RW; moderate cues for motor planning and awareness of the L side.  Supervision assist overall with LRAD  imrpoved attention to the L. safety with gait and transfers. Neuromotor re-eduction for improved indepence with  functional tasks.   Communication   pragmatic communication deficits, Mod A  Supervision  eye contact, tone (very monotone now), vocal intensity   Safety/Cognition/ Behavioral Observations  Min-Mod  Supervision  recall, selective attention, emergent awareness, problem solving   Pain   pt denies pain  free of pain  assess pain qshift and prn   Skin   skin intact  skin remains free of breakdown or infection  assess skin qshift     Discharge Planning:  Back home with brother who is blind and deaf, daughter to take FMLA. Made aware yesterday will need 24/7 care at discharge. Daughter here yesterday for evaluations   Team Discussion: BP soft, MD monitoring. DM off; MD to adjust metformin dosage. Progress limited by problem solving deficits, and patient has a delayed response to commands.   Patient on target to meet rehab goals: Currently mod assist with walking with a walker and supervision goals set for discharge.  *See Care Plan and progress notes for long and short-term goals.   Revisions to Treatment Plan:  Neuropsych referral; patient with new onset monotone voice, poor eye contact.etc. Appears to be more than pragmatic issue. Teaching Needs: Transfers, toileting, medications, etc.  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  Barriers to Discharge: Behavior;Medical stability   Possible Resolutions to [employer] Focus: Therapies, optimize DM/BP meds, follow labs - K   Continued Need for Acute Rehabilitation Level of Care: The patient requires daily medical management by a physician with specialized training in physical medicine and rehabilitation for the  following reasons: Direction of a multidisciplinary physical rehabilitation program to maximize functional independence : Yes Medical management of patient stability for increased activity during participation in an intensive rehabilitation regime.: Yes Analysis of laboratory values and/or radiology reports with any subsequent need for medication adjustment and/or medical intervention. : Yes   I attest that I was present, lead the team conference, and concur with the assessment and plan of the team.   Dorien Chihuahua B 06/27/2020, 2:09 PM

## 2020-06-27 NOTE — Progress Notes (Signed)
Patient ID: Cheryl Ortiz, female   DOB: Sep 18, 1950, 70 y.o.   MRN: [id number]  Met with pt and daughter who was here to discuss team conference goals supervision level and target discharge date 2/24. Pt is ding well and making progress in her therapies. She is making more eye contact today and speaking more. Daughter will have FMLA papers sent to this worker to get completed for her. Work on discharge needs.

## 2020-06-27 NOTE — Progress Notes (Signed)
[place] PHYSICAL MEDICINE & REHABILITATION PROGRESS NOTE  Subjective/Complaints: Patient seen sitting up in bed this Am.  She states she slept well overnight.  She makes limited eye contact. She states she had a good first day of therapies yesterday and was told she did very well.   ROS: +SOB. Denies CP, N/V/D  Objective: Vital Signs: Blood pressure 114/77, pulse 83, temperature 98.7 F (37.1 C), resp. rate 14, height 5\' 6"  (1.676 m), weight 101.8 kg, SpO2 95 %. No results found. Recent Labs    06/25/20 0114 06/26/20 0506  WBC 8.1 6.3  HGB 12.3 12.8  HCT 36.8 38.1  PLT 273 262   Recent Labs    06/25/20 0114 06/26/20 0506  NA 137 138  K 3.3* 3.6  CL 104 103  CO2 23 22  GLUCOSE 116* 132*  BUN 15 18  CREATININE 0.95 1.00  CALCIUM 9.2 9.2    Intake/Output Summary (Last 24 hours) at 06/27/2020 1007 Last data filed at 06/27/2020 0800 Gross per 24 hour  Intake 580 ml  Output --  Net 580 ml        Physical Exam: BP 114/77 (BP Location: Left Arm)   Pulse 83   Temp 98.7 F (37.1 C)   Resp 14   Ht 5\' 6"  (1.676 m)   Wt 101.8 kg   SpO2 95%   BMI 36.22 kg/m  Constitutional: No distress . Vital signs reviewed. HENT: Normocephalic.  Atraumatic. Eyes: Right gaze preference. No discharge. Cardiovascular: No JVD.  RRR. Respiratory: Normal effort.  No stridor.  Bilateral clear to auscultation. GI: Non-distended.  BS +. Skin: Warm and dry.  Intact. Psych: Very flat. Normal behavior. Musc: No edema in extremities.  No tenderness in extremities. Neuro:  Alert Right lean Motor: Left upper extremity: 4-/5 proximal distal Left lower extremity: 2+-3-/5 hip flexion, knee extension, 3/5 ankle dorsiflexion  Assessment/Plan: 1. Functional deficits which require 3+ hours per day of interdisciplinary therapy in a comprehensive inpatient rehab setting.  Physiatrist is providing close team supervision and 24 hour management of active medical problems listed  below.  Physiatrist and rehab team continue to assess barriers to discharge/monitor patient progress toward functional and medical goals   Care Tool:  Bathing    Body parts bathed by patient: Left arm,Chest,Abdomen,Front perineal area,Buttocks,Right upper leg,Left upper leg,Face   Body parts bathed by helper: Left lower leg,Right arm,Right lower leg     Bathing assist Assist Level: Moderate Assistance - Patient 50 - 74%     Upper Body Dressing/Undressing Upper body dressing   What is the patient wearing?: Pull over shirt    Upper body assist Assist Level: Maximal Assistance - Patient 25 - 49%    Lower Body Dressing/Undressing Lower body dressing      What is the patient wearing?: Incontinence brief,Pants     Lower body assist Assist for lower body dressing: Maximal Assistance - Patient 25 - 49%     Toileting Toileting    Toileting assist Assist for toileting: Total Assistance - Patient < 25%     Transfers Chair/bed transfer  Transfers assist     Chair/bed transfer assist level: Total Assistance - Patient < 25% (2person & stedy)     Locomotion Ambulation   Ambulation assist      Assist level: Moderate Assistance - Patient 50 - 74% Assistive device: Walker-rolling Max distance: 75   Walk 10 feet activity   Assist     Assist level: Moderate Assistance - Patient - 64 -  74% Assistive device: Walker-rolling   Walk 50 feet activity   Assist    Assist level: Moderate Assistance - Patient - 50 - 74% Assistive device: Walker-rolling    Walk 150 feet activity   Assist Walk 150 feet activity did not occur: Safety/medical concerns         Walk 10 feet on uneven surface  activity   Assist Walk 10 feet on uneven surfaces activity did not occur: Safety/medical concerns         Wheelchair     Assist Will patient use wheelchair at discharge?: No      Wheelchair assist level: Dependent - Patient 0% Max wheelchair distance: 200     Wheelchair 50 feet with 2 turns activity    Assist        Assist Level: Dependent - Patient 0%   Wheelchair 150 feet activity     Assist      Assist Level: Dependent - Patient 0%    Medical Problem List and Plan: 1.  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  Continue CIR  Team conference today to discuss current and goals and coordination of care, home and environmental barriers, and discharge planning with nursing, case manager, and therapies. Please see conference note from today as well.  2.  Antithrombotics: -DVT/anticoagulation: SCDs             -antiplatelet therapy: N/A 3. Pain Management: Tylenol as needed 4. Mood: Provide emotional support             -antipsychotic agents: N/A 5. Neuropsych: This patient is?  Fully capable of making decisions on her own behalf. 6. Skin/Wound Care: Routine skin checks 7. Fluids/Electrolytes/Nutrition: Routine in and outs 8.  Diabetes mellitus type II with hyperglycemia.  Hemoglobin A1c 6.2.  Continue Glucophage 1000 mg daily.    Slightly labile, no values this AM  Monitor with increased mobility 9.  Hypertension.   HCTZ 25 mg daily, decreased to 12.5 on 2/10 (received AM dose)  Soft on 2/9  Monitor with increased mobility 10.  History of kidney stones with stent placement 2017 11. Obesity (BMI 36.48): Provide dietary counseling 12.  Hypokalemia  Potassium 3.6 on 2/8  Continue to monitor  LOS: 2 days A FACE TO FACE EVALUATION WAS PERFORMED  Cheryl Ortiz Cheryl Ortiz 06/27/2020, 10:07 AM

## 2020-06-27 NOTE — Progress Notes (Signed)
Occupational Therapy Session Note  Patient Details  Name: Cheryl Ortiz MRN: [id number] Date of Birth: [birth year]  Today's Date: 06/27/2020 OT Individual Time: 8841-6606 OT Individual Time Calculation (min): 30 min    Short Term Goals: Week 1:  OT Short Term Goal 1 (Week 1): Pt will complete toilet transfer with min A during a stand pivot. OT Short Term Goal 2 (Week 1): Pt will don a shirt with verbal cues only. OT Short Term Goal 3 (Week 1): Pt will don pants with mod A or less. OT Short Term Goal 4 (Week 1): Pt will be able to actively use L arm to reach across and wash R arm. OT Short Term Goal 5 (Week 1): Pt will demonstrate improved L side awareness when stepping into the shower only needing 1 verbal cue to fully step to her L..  Skilled Therapeutic Interventions/Progress Updates:    Pt received in the chair ready for therapy. Pt taken to gym and set up at hi-low table to stand to work on L [facility]. She stood up and thought she had accidentally gone to the bathroom.  Pt taken back to her room. Pt requested to don gloves to keep her hands clean.   Pt completed transfer to toilet with only min using bar but mod to rise to stand.  She only had a wet brief but spent quite a bit of time cleansing and was able to do so without cues.   Mod A to pull pants back up. Ambulated with RW to sink with mod A due to L lateral lean and LLE weakness.  At sink,demonstrated L neglect as she needed cues to doff L glove prior to washing hand and to wash soap off of L hand prior to drying with a paper towel.  Pt then ambulated back to wc.  She was quite talkative today with improved affect.  Pt resting in wc with belt alarm on and all needs met.   Therapy Documentation Precautions:  Precautions Precautions: Fall Restrictions Weight Bearing Restrictions: No   Pain: Pain Assessment Pain Scale: 0-10 Pain Score: 0-No pain ADL: ADL Eating: Set up Grooming: Setup Upper Body Bathing: Minimal  assistance Where Assessed-Upper Body Bathing: Shower Lower Body Bathing: Moderate assistance Where Assessed-Lower Body Bathing: Shower Upper Body Dressing: Moderate assistance Where Assessed-Upper Body Dressing: Wheelchair Lower Body Dressing: Maximal assistance Where Assessed-Lower Body Dressing: Wheelchair Toileting: Maximal assistance Where Assessed-Toileting: Glass blower/designer: Moderate assistance Toilet Transfer Method: Stand pivot Toilet Transfer Equipment: Energy manager: Moderate assistance Social research officer, government Method: Radiographer, therapeutic: Transfer tub bench,Grab bars  Therapy/Group: Individual Therapy  [place] 06/27/2020, 9:03 AM

## 2020-06-27 NOTE — Progress Notes (Signed)
Speech Language Pathology Daily Session Note  Patient Details  Name: Cheryl Ortiz MRN: [id number] Date of Birth: 09/12/1950  Today's Date: 06/27/2020 SLP Individual Time: 3662-9476 SLP Individual Time Calculation (min): 26 min  Short Term Goals: Week 1: SLP Short Term Goal 1 (Week 1): Pt will self-monitor her vocal intensity, tone, and eye contact (pragmatic communication skills) to increase intelligibility to 90% in conversaton with Min A cues. SLP Short Term Goal 2 (Week 1): Pt will demonstrate ability to problme solving basic to mildly complex tasks with Min A verbal/visual cues. SLP Short Term Goal 3 (Week 1): Pt will selectively attend to functional tasks with Min A verbal/visual cues for redirection. SLP Short Term Goal 4 (Week 1): Pt will demonstrate awareness of functional errors with Min A cues.   Skilled Therapeutic Interventions: Pt was seen for skilled ST targeting cognitive goals. SLP facilitated session with basic money and medication management tasks from the ALFA. She required overall Min A verbal and visual cues for problem solving and error awareness when using coins and cash to calculate and display change as well as totals. Initially Mod but faded to Min A verbal and visual cues provided for problem solving to interpret medication labels to answer questions and organize a pill chart. Pt was also distracted, particularly with her cell phone and wanting to look on social media during session. Min A verbal cues were provided for redirection to tasks thorughout session. Her affect remains very flat. Pt left sitting upright in chair with alarm set and needs within reach. Continue per current plan of care.          Pain Pain Assessment Pain Scale: 0-10 Pain Score: 0-No pain  Therapy/Group: Individual Therapy  Arbutus Leas 06/27/2020, 7:20 AM

## 2020-06-28 ENCOUNTER — Inpatient Hospital Stay (HOSPITAL_COMMUNITY): Payer: Medicare Other

## 2020-06-28 LAB — GLUCOSE, CAPILLARY
Glucose-Capillary: 159 mg/dL — ABNORMAL HIGH (ref 70–99)
Glucose-Capillary: 117 mg/dL — ABNORMAL HIGH (ref 70–99)
Glucose-Capillary: 133 mg/dL — ABNORMAL HIGH (ref 70–99)
Glucose-Capillary: 126 mg/dL — ABNORMAL HIGH (ref 70–99)

## 2020-06-28 NOTE — Progress Notes (Signed)
Follow-up CT scan shows moderate large right frontal hematoma unchanged in size no midline shift.  There was some increase surrounding edema and local mass-effect.  Curb consult with neurology services continue to monitor no current changes at this time.

## 2020-06-28 NOTE — Progress Notes (Addendum)
[facility] PHYSICAL MEDICINE & REHABILITATION PROGRESS NOTE  Subjective/Complaints: Patient seen lying in bed this morning working with therapies.  She states she slept well overnight.  No reported issues overnight.  Attention limited, discussed with therapies.  ROS: Denies CP, SOB, N/V/D  Objective: Vital Signs: Blood pressure 113/66, pulse (!) 55, temperature 99.1 F (37.3 C), temperature source Oral, resp. rate 18, height 5\' 6"  (1.676 m), weight 101.8 kg, SpO2 96 %. CT HEAD WO CONTRAST  Result Date: 06/28/2020 CLINICAL DATA:  Intracranial hemorrhage. EXAM: CT HEAD WITHOUT CONTRAST TECHNIQUE: Contiguous axial images were obtained from the base of the skull through the vertex without intravenous contrast. COMPARISON:  CT head 06/23/2020. FINDINGS: Brain: High-density hematoma in the right frontal lobe is unchanged in size. There is increased surrounding edema. No midline shift. No new area of hemorrhage. Ventricle size normal. No midline shift. Small chronic infarct right cerebellum Vascular: Negative for hyperdense vessel Skull: Negative Sinuses/Orbits: Paranasal sinuses clear.  Negative orbit Other: None IMPRESSION: Moderate large right frontal hematoma unchanged in size. Increased surrounding edema and local mass-effect. No midline shift. Electronically Signed   By: Franchot Gallo M.D.   On: 06/28/2020 12:34   Recent Labs    06/26/20 0506  WBC 6.3  HGB 12.8  HCT 38.1  PLT 262   Recent Labs    06/26/20 0506  NA 138  K 3.6  CL 103  CO2 22  GLUCOSE 132*  BUN 18  CREATININE 1.00  CALCIUM 9.2    Intake/Output Summary (Last 24 hours) at 06/28/2020 1303 Last data filed at 06/28/2020 0847 Gross per 24 hour  Intake 652 ml  Output --  Net 652 ml        Physical Exam: BP 113/66 (BP Location: Left Arm)   Pulse (!) 55   Temp 99.1 F (37.3 C) (Oral)   Resp 18   Ht 5\' 6"  (1.676 m)   Wt 101.8 kg   SpO2 96%   BMI 36.22 kg/m  Constitutional: No distress . Vital signs  reviewed. HENT: Normocephalic.  Atraumatic. Eyes: EOMI. No discharge. Cardiovascular: No JVD.  RRR. Respiratory: Normal effort.  No stridor.  Bilateral clear to auscultation. GI: Non-distended.  BS +. Skin: Warm and dry.  Intact. Psych: Flat.  Very delayed. Musc: No edema in extremities.  No tenderness in extremities. Neuro:  Alert Right lean Motor: Left upper extremity: 4-/5 proximal distal Left lower extremity: 0/5 proximal distal  Assessment/Plan: 1. Functional deficits which require 3+ hours per day of interdisciplinary therapy in a comprehensive inpatient rehab setting.  Physiatrist is providing close team supervision and 24 hour management of active medical problems listed below.  Physiatrist and rehab team continue to assess barriers to discharge/monitor patient progress toward functional and medical goals   Care Tool:  Bathing    Body parts bathed by patient: Left arm,Chest,Abdomen,Front perineal area,Buttocks,Right upper leg,Left upper leg,Face   Body parts bathed by helper: Left lower leg,Right arm,Right lower leg     Bathing assist Assist Level: Moderate Assistance - Patient 50 - 74%     Upper Body Dressing/Undressing Upper body dressing   What is the patient wearing?: Pull over shirt    Upper body assist Assist Level: Maximal Assistance - Patient 25 - 49%    Lower Body Dressing/Undressing Lower body dressing      What is the patient wearing?: Pants,Incontinence brief     Lower body assist Assist for lower body dressing: Maximal Assistance - Patient 25 - 49%  Toileting Toileting    Toileting assist Assist for toileting: Maximal Assistance - Patient 25 - 49%     Transfers Chair/bed transfer  Transfers assist     Chair/bed transfer assist level: Moderate Assistance - Patient 50 - 74%     Locomotion Ambulation   Ambulation assist      Assist level: Minimal Assistance - Patient > 75% Assistive device: Walker-rolling Max distance:  160'   Walk 10 feet activity   Assist     Assist level: Minimal Assistance - Patient > 75% Assistive device: Walker-rolling   Walk 50 feet activity   Assist    Assist level: Minimal Assistance - Patient > 75% Assistive device: Walker-rolling    Walk 150 feet activity   Assist Walk 150 feet activity did not occur: Safety/medical concerns  Assist level: Minimal Assistance - Patient > 75% Assistive device: Walker-rolling    Walk 10 feet on uneven surface  activity   Assist Walk 10 feet on uneven surfaces activity did not occur: Safety/medical concerns   Assist level: Minimal Assistance - Patient > 75% Assistive device: Aeronautical engineer Will patient use wheelchair at discharge?: No      Wheelchair assist level: Dependent - Patient 0% Max wheelchair distance: 200    Wheelchair 50 feet with 2 turns activity    Assist        Assist Level: Dependent - Patient 0%   Wheelchair 150 feet activity     Assist      Assist Level: Dependent - Patient 0%    Medical Problem List and Plan: 1.  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  Continue CIR  Repeat stat CT ordered due to increased left lower extremity weakness, discussed with therapies-left lower extremity weakness appears to be persistent and not secondary to inattention 2.  Antithrombotics: -DVT/anticoagulation: SCDs             -antiplatelet therapy: N/A 3. Pain Management: Tylenol as needed 4. Mood: Provide emotional support             -antipsychotic agents: N/A 5. Neuropsych: This patient is not fully capable of making decisions on her own behalf.  Will consider Neurostimulant 6. Skin/Wound Care: Routine skin checks 7. Fluids/Electrolytes/Nutrition: Routine in and outs 8.  Diabetes mellitus type II with hyperglycemia.   Hemoglobin A1c 6.2.  Continue Glucophage 1000 mg daily.    Labile on 2/10, monitor trend  Monitor with increased mobility 9.  Hypertension.   HCTZ 25 mg daily, decreased to 12.5 on 2/10  Controlled on 2/10  Monitor with increased mobility 10.  History of kidney stones with stent placement 2017 11. Obesity (BMI 36.48): Provide dietary counseling 12.  Hypokalemia  Potassium 3.6 on 2/8, labs ordered for tomorrow  Continue to monitor  LOS: 3 days A FACE TO FACE EVALUATION WAS PERFORMED  Aleiyah Halpin Lorie Phenix 06/28/2020, 1:03 PM

## 2020-06-28 NOTE — Progress Notes (Signed)
Occupational Therapy Session Note  Patient Details  Name: Cheryl Ortiz MRN: [id number] Date of Birth: 01-24-1951  Today's Date: 06/28/2020 OT Individual Time: 1035-1130 OT Individual Time Calculation (min): 55 min    Short Term Goals: Week 1:  OT Short Term Goal 1 (Week 1): Pt will complete toilet transfer with min A during a stand pivot. OT Short Term Goal 2 (Week 1): Pt will don a shirt with verbal cues only. OT Short Term Goal 3 (Week 1): Pt will don pants with mod A or less. OT Short Term Goal 4 (Week 1): Pt will be able to actively use L arm to reach across and wash R arm. OT Short Term Goal 5 (Week 1): Pt will demonstrate improved L side awareness when stepping into the shower only needing 1 verbal cue to fully step to her L..  Skilled Therapeutic Interventions/Progress Updates:    Pt seen bedside this session as she was waiting to be transported to a CT scan for physical/cognitive changes.    Pt awake in bed, looking at a Bible verse on the phone. Asked pt to read the verse out loud and she had difficulty tracking the lines to read the text fully.   Positioned pt in chair position in the bed.   Worked on L visual scanning, L FMC, L shoulder A/arom: -pt had to scan letters placed on her table to find all the yellow letters (did so without cues) -scanned letters to find correct letters to spell the word QUEEN (she knew which letters but extra processing time to find letters on her L side) -bead stringing activity with mod cues and occasional hand over hand A to guide L hand as pt demonstrated significant L neglect -B hands on light weight dowel bar to reach arms up over head with min A PROM to L shoulder due to internal rotation tightness  Pt participated well but did demonstrate increased L side weakness and much slower processing.   In bed with all needs met.  Bed alarm set.   Therapy Documentation Precautions:  Precautions Precautions: Fall Restrictions Weight  Bearing Restrictions: No    Pain: Pain Assessment Pain Scale: 0-10 Pain Score: 0-No pain ADL: ADL Eating: Set up Grooming: Setup Upper Body Bathing: Minimal assistance Where Assessed-Upper Body Bathing: Shower Lower Body Bathing: Moderate assistance Where Assessed-Lower Body Bathing: Shower Upper Body Dressing: Moderate assistance Where Assessed-Upper Body Dressing: Wheelchair Lower Body Dressing: Maximal assistance Where Assessed-Lower Body Dressing: Wheelchair Toileting: Maximal assistance Where Assessed-Toileting: Glass blower/designer: Moderate assistance Toilet Transfer Method: Stand pivot Toilet Transfer Equipment: Energy manager: Moderate assistance Social research officer, government Method: Radiographer, therapeutic: Transfer tub bench,Grab bars   Therapy/Group: Individual Therapy  [place] 06/28/2020, 12:53 PM

## 2020-06-28 NOTE — Progress Notes (Signed)
Speech Language Pathology Daily Session Note  Patient Details  Name: Cheryl Ortiz MRN: [id number] Date of Birth: [birth year]  Today's Date: 06/28/2020 SLP Individual Time: 3354-5625 SLP Individual Time Calculation (min): 30 min  Short Term Goals: Week 1: SLP Short Term Goal 1 (Week 1): Pt will self-monitor her vocal intensity, tone, and eye contact (pragmatic communication skills) to increase intelligibility to 90% in conversaton with Min A cues. SLP Short Term Goal 2 (Week 1): Pt will demonstrate ability to problme solving basic to mildly complex tasks with Min A verbal/visual cues. SLP Short Term Goal 3 (Week 1): Pt will selectively attend to functional tasks with Min A verbal/visual cues for redirection. SLP Short Term Goal 4 (Week 1): Pt will demonstrate awareness of functional errors with Min A cues.  Skilled Therapeutic Interventions:   Patient seen for skilled ST session with her daughter present in beginning and end for education. Patient oriented to month, day of week, year, but not date. She was aware that last Friday was 2/4, but was not able to determine date today until SLP provided cues. Patient with very flat, monotone voice and did not make any eye contact with SLP during session. She answered all questions and was able to recall activities completed in PT, OT, morning ST sessions with minimal cues. She was able to demonstrate adequate attention and visual tracking to read bible verse printed in large font. (OT reported that patient had difficulty with left-right scanning when looking at reading passage on her phone. Patent stated that yesterday she felt good about her therapy but today she felt discouraged because she wasn't able to work on walking today, and therapist's observed possible changes in cognitive status and so a repeat CT was performed. Patient started to decline in her frequency of responses and was more and delayed when doing so. She continues to benefit from SLP  intervention to maximize cognitive-linguistic function prior to discharge. Pain Pain Assessment Pain Scale: 0-10 Pain Score: 0-No pain  Therapy/Group: Individual Therapy  Sonia Baller, MA, CCC-SLP Speech Therapy

## 2020-06-28 NOTE — Progress Notes (Signed)
Physical Therapy Session Note  Patient Details  Name: Cheryl Ortiz MRN: [id number] Date of Birth: 06-Sep-1950  Today's Date: 06/28/2020 PT Individual Time: 0900-0930 PT Individual Time Calculation (min): 30 min   Short Term Goals: Week 1:  PT Short Term Goal 1 (Week 1): Patient to perform supine to sit with min A after cueing PT Short Term Goal 2 (Week 1): Patient to perform bed<>chair transfers with min A with cues PT Short Term Goal 3 (Week 1): Patient to initiate stair training. PT Short Term Goal 4 (Week 1): Patient to propel w/c 34' with min A for L side awareness.  Skilled Therapeutic Interventions/Progress Updates:    Pt received supine in bed, awake and agreeable to therapy. Reports medial L knee pain, tender to palpation but inconsistent with responses. Continues to show limited eye contact to therapist. Noted significantly poor initiation and L inattention, appeared worse than compared to prior sessions. Also noted increased L leg weakness although this was difficult due to her L inattention. Pt also self reports feeling weaker in her L leg as well. Performed supine<>sit, requiring maxA for initiation and maxA for completing. Once EOB she required max/totalA due to L lateral and posterior lean with very delayed righting reactions despite max cueing. Required maxA for forward scooting at EOB. Attempted to don pants while seated EOB but patient unable to participate with this. Attempted sit<>stand but unable to even initiate to clear buttock from bed. Raised EOB height and she was able to complete with maxA. Noted heavily saturated brief once standing and returned to supine with totalA. NT called for +2 assist for rolling L<>R and pericare. Required totalA +2 for supine scooting to [facility] with trendelenburg. RN notified after session of worsening neurological symptoms who stated she would report to provider. Pt ended session semi-reclined with HOB elevated, needs within reach, bed alarm on.  Pt missed 30 minutes of skilled therapy  Therapy Documentation Precautions:  Precautions Precautions: Fall Restrictions Weight Bearing Restrictions: No General: PT Amount of Missed Time (min): 30 Minutes PT Missed Treatment Reason: Patient ill (Comment) (New onset of L weakness and worsening initiation)  Therapy/Group: Individual Therapy  Raynor Calcaterra P Takeila Thayne PT 06/28/2020, 7:44 AM

## 2020-06-28 NOTE — Progress Notes (Signed)
Speech Language Pathology Daily Session Note  Patient Details  Name: Cheryl Ortiz MRN: [id number] Date of Birth: 07-06-1950  Today's Date: 06/28/2020 SLP Individual Time: 0730-0826 SLP Individual Time Calculation (min): 56 min  Short Term Goals: Week 1: SLP Short Term Goal 1 (Week 1): Pt will self-monitor her vocal intensity, tone, and eye contact (pragmatic communication skills) to increase intelligibility to 90% in conversaton with Min A cues. SLP Short Term Goal 2 (Week 1): Pt will demonstrate ability to problme solving basic to mildly complex tasks with Min A verbal/visual cues. SLP Short Term Goal 3 (Week 1): Pt will selectively attend to functional tasks with Min A verbal/visual cues for redirection. SLP Short Term Goal 4 (Week 1): Pt will demonstrate awareness of functional errors with Min A cues.  Skilled Therapeutic Interventions: Pt was seen for skilled ST targeting cognitive goals. Upon arrival pt was sitting in bed with mask on and breakfast tray in front of her, juice in hand. When asked if she was still eating or planning to eat, pt stated "I'm about to eat" but there was no evidence of intent to initiate without assistance. Overall Mod A multimodal cues and set up assist was required for initiation of self-feeding, and Min A for problem solving repositioning and tray set up. Spoke with RN regarding need for heavy set up and intermittent supervision during meals, including assistance minimizing distractions, placing utensils in hand, etc. In order to help pt initiate and sustain attention to self-feeding. She does still present with slow processing and reduced attention, which lead to extended time required for her to finish her meal. SLP also provided pt with time constraint in which to finish breakfast to further aid in attention, and in order to move on to work toward other planned interventions. SLP also introduced a novel semi-complex card task, during with she required Mod A  verbal cues for redirection in order to selectively attend to task with very mild distraction (tv on but no sound). Supervision A verbal and visual cues were also provided for recall and problem solving within task. Pt left laying in bed with alarm set and needs within reach. Continue per current plan of care.          Pain Pain Assessment Pain Scale: 0-10 Pain Score: 0-No pain  Therapy/Group: Individual Therapy  Arbutus Leas 06/28/2020, 7:25 AM

## 2020-06-29 DIAGNOSIS — R41840 Attention and concentration deficit: Secondary | ICD-10-CM

## 2020-06-29 LAB — BASIC METABOLIC PANEL
Anion gap: 12 (ref 5–15)
BUN: 21 mg/dL (ref 8–23)
CO2: 24 mmol/L (ref 22–32)
Calcium: 9.4 mg/dL (ref 8.9–10.3)
Chloride: 100 mmol/L (ref 98–111)
Creatinine, Ser: 1.03 mg/dL — ABNORMAL HIGH (ref 0.44–1.00)
GFR, Estimated: 60 mL/min — ABNORMAL LOW (ref >60)
Glucose, Bld: 140 mg/dL — ABNORMAL HIGH (ref 70–99)
Potassium: 3.3 mmol/L — ABNORMAL LOW (ref 3.5–5.1)
Sodium: 136 mmol/L (ref 135–145)

## 2020-06-29 LAB — GLUCOSE, CAPILLARY
Glucose-Capillary: 146 mg/dL — ABNORMAL HIGH (ref 70–99)
Glucose-Capillary: 125 mg/dL — ABNORMAL HIGH (ref 70–99)
Glucose-Capillary: 101 mg/dL — ABNORMAL HIGH (ref 70–99)
Glucose-Capillary: 144 mg/dL — ABNORMAL HIGH (ref 70–99)

## 2020-06-29 MED ORDER — POTASSIUM CHLORIDE CRYS ER 20 MEQ PO TBCR
30.0000 meq | EXTENDED_RELEASE_TABLET | Freq: Two times a day (BID) | ORAL | Status: AC
  Administered 2020-06-29 – 2020-06-30 (×4): 30 meq via ORAL
  Filled 2020-06-29 (×4): qty 1

## 2020-06-29 MED ORDER — METHYLPHENIDATE HCL 5 MG PO TABS
2.5000 mg | ORAL_TABLET | Freq: Two times a day (BID) | ORAL | Status: DC
  Administered 2020-06-29 – 2020-06-30 (×2): 2.5 mg via ORAL
  Filled 2020-06-29 (×2): qty 1

## 2020-06-29 MED ORDER — EXERCISE FOR HEART AND HEALTH BOOK
Freq: Once | Status: AC
  Filled 2020-06-29: qty 1

## 2020-06-29 NOTE — Progress Notes (Signed)
Patient ID: Cheryl Ortiz, female   DOB: Sep 29, 1950, 70 y.o.   MRN: [id number] Met with the patient and her daughter re sole of the nurse CM and address educational need for secondary stroke prevention and collaboration with the SW to facilitate preparation for discharge. Reviewed risks including DM with A1C of 6.2/CMM dietary recommendations, HTCZ with DASH diet and exercise and the heart. Patient's daughter noted she was sharing information/handouts with other family members so they could make it a group effort. No specific questions noted at present. Continue to follow along to discharge to address questions/educational needs. Margarito Liner

## 2020-06-29 NOTE — Progress Notes (Signed)
[facility] PHYSICAL MEDICINE & REHABILITATION PROGRESS NOTE  Subjective/Complaints: Patient seen sitting up in bed this morning.  She states she slept well overnight.  No reported issues overnight. She is delayed.  ROS: Denies CP, SOB, N/V/D  Objective: Vital Signs: Blood pressure 125/80, pulse 93, temperature 99 F (37.2 C), temperature source Oral, resp. rate 16, height 5\' 6"  (1.676 m), weight 101.8 kg, SpO2 95 %. CT HEAD WO CONTRAST  Result Date: 06/28/2020 CLINICAL DATA:  Intracranial hemorrhage. EXAM: CT HEAD WITHOUT CONTRAST TECHNIQUE: Contiguous axial images were obtained from the base of the skull through the vertex without intravenous contrast. COMPARISON:  CT head 06/23/2020. FINDINGS: Brain: High-density hematoma in the right frontal lobe is unchanged in size. There is increased surrounding edema. No midline shift. No new area of hemorrhage. Ventricle size normal. No midline shift. Small chronic infarct right cerebellum Vascular: Negative for hyperdense vessel Skull: Negative Sinuses/Orbits: Paranasal sinuses clear.  Negative orbit Other: None IMPRESSION: Moderate large right frontal hematoma unchanged in size. Increased surrounding edema and local mass-effect. No midline shift. Electronically Signed   By: Franchot Gallo M.D.   On: 06/28/2020 12:34   No results for input(s): WBC, HGB, HCT, PLT in the last 72 hours. Recent Labs    06/29/20 0449  NA 136  K 3.3*  CL 100  CO2 24  GLUCOSE 140*  BUN 21  CREATININE 1.03*  CALCIUM 9.4    Intake/Output Summary (Last 24 hours) at 06/29/2020 1007 Last data filed at 06/29/2020 0818 Gross per 24 hour  Intake 715 ml  Output --  Net 715 ml        Physical Exam: BP 125/80 (BP Location: Left Arm)   Pulse 93   Temp 99 F (37.2 C) (Oral)   Resp 16   Ht 5\' 6"  (1.676 m)   Wt 101.8 kg   SpO2 95%   BMI 36.22 kg/m  Constitutional: No distress . Vital signs reviewed. HENT: Normocephalic.  Atraumatic. Eyes: EOMI. No  discharge. Cardiovascular: No JVD.  RRR. Respiratory: Normal effort.  No stridor.  Bilateral clear to auscultation. GI: Non-distended.  BS +. Skin: Warm and dry.  Intact. Psych: Flat. Very delayed. Musc: No edema in extremities.  No tenderness in extremities. Neuro:  Alert Right lean, unchanged Motor: Left upper extremity: 4-/5 proximal distal Left lower extremity: 2 - /5 proximal distal  Assessment/Plan: 1. Functional deficits which require 3+ hours per day of interdisciplinary therapy in a comprehensive inpatient rehab setting.  Physiatrist is providing close team supervision and 24 hour management of active medical problems listed below.  Physiatrist and rehab team continue to assess barriers to discharge/monitor patient progress toward functional and medical goals   Care Tool:  Bathing    Body parts bathed by patient: Left arm,Chest,Abdomen,Front perineal area,Buttocks,Right upper leg,Left upper leg,Face   Body parts bathed by helper: Left lower leg,Right arm,Right lower leg     Bathing assist Assist Level: Moderate Assistance - Patient 50 - 74%     Upper Body Dressing/Undressing Upper body dressing   What is the patient wearing?: Pull over shirt    Upper body assist Assist Level: Maximal Assistance - Patient 25 - 49%    Lower Body Dressing/Undressing Lower body dressing      What is the patient wearing?: Pants,Incontinence brief     Lower body assist Assist for lower body dressing: Maximal Assistance - Patient 25 - 49%     Toileting Toileting    Toileting assist Assist for toileting: Maximal  Assistance - Patient 25 - 49%     Transfers Chair/bed transfer  Transfers assist     Chair/bed transfer assist level: Moderate Assistance - Patient 50 - 74%     Locomotion Ambulation   Ambulation assist      Assist level: Minimal Assistance - Patient > 75% Assistive device: Walker-rolling Max distance: 160'   Walk 10 feet activity   Assist      Assist level: Minimal Assistance - Patient > 75% Assistive device: Walker-rolling   Walk 50 feet activity   Assist    Assist level: Minimal Assistance - Patient > 75% Assistive device: Walker-rolling    Walk 150 feet activity   Assist Walk 150 feet activity did not occur: Safety/medical concerns  Assist level: Minimal Assistance - Patient > 75% Assistive device: Walker-rolling    Walk 10 feet on uneven surface  activity   Assist Walk 10 feet on uneven surfaces activity did not occur: Safety/medical concerns   Assist level: Minimal Assistance - Patient > 75% Assistive device: Aeronautical engineer Will patient use wheelchair at discharge?: No      Wheelchair assist level: Dependent - Patient 0% Max wheelchair distance: 200    Wheelchair 50 feet with 2 turns activity    Assist        Assist Level: Dependent - Patient 0%   Wheelchair 150 feet activity     Assist      Assist Level: Dependent - Patient 0%    Medical Problem List and Plan: 1.  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  Continue CIR  Repeat stat CT showing unchanged size of hematoma.  No changes per neurology. 2.  Antithrombotics: -DVT/anticoagulation: SCDs             -antiplatelet therapy: N/A 3. Pain Management: Tylenol as needed 4. Mood: Provide emotional support             -antipsychotic agents: N/A 5. Neuropsych: This patient is not fully capable of making decisions on her own behalf.  Trial low-dose Ritalin 6. Skin/Wound Care: Routine skin checks 7. Fluids/Electrolytes/Nutrition: Routine in and outs 8.  Diabetes mellitus type II with hyperglycemia.  Hemoglobin A1c 6.2.  Continue Glucophage 1000 mg daily.    Mildly elevated on 2/11,?  Trending up, monitor trend  Monitor with increased mobility 9.   Hypertension.   HCTZ 25 mg daily, decreased to 12.5 on 2/10  Controlled on 2/11  Monitor with increased mobility 10.  History of kidney stones with stent placement 2017 11. Obesity (BMI 36.48): Provide dietary counseling 12.  Hypokalemia  Potassium 3.3 on 2/11, labs ordered for Monday  Supplemented x2 days  Continue to monitor  LOS: 4 days A FACE TO FACE EVALUATION WAS PERFORMED  Annissa Andreoni Lorie Phenix 06/29/2020, 10:07 AM

## 2020-06-29 NOTE — Progress Notes (Signed)
Speech Language Pathology Daily Session Note  Patient Details  Name: Cheryl Ortiz MRN: [id number] Date of Birth: 1951/03/29  Today's Date: 06/29/2020 SLP Individual Time: 0071-2197 SLP Individual Time Calculation (min): 42 min  Short Term Goals: Week 1: SLP Short Term Goal 1 (Week 1): Pt will self-monitor her vocal intensity, tone, and eye contact (pragmatic communication skills) to increase intelligibility to 90% in conversaton with Min A cues. SLP Short Term Goal 2 (Week 1): Pt will demonstrate ability to problme solving basic to mildly complex tasks with Min A verbal/visual cues. SLP Short Term Goal 3 (Week 1): Pt will selectively attend to functional tasks with Min A verbal/visual cues for redirection. SLP Short Term Goal 4 (Week 1): Pt will demonstrate awareness of functional errors with Min A cues.  Skilled Therapeutic Interventions: Pt was seen for skilled ST targeting cognitive-linguistic goals. Upon arrival, pt had still not finished even 30% of her meal, therefore Mod A multimodal cueing provided for initiation and Min A for problem solving throughout self feeding to finish a cup of fruit. Spoke with RN and recommended pt be changed to full rather than intermittent due to cognition. SLP further facilitated session with a semi-complex monthly calendar and scheduling task, during which pt required overall Mod A verbal and visual cues for error awareness, and Min A for problem solving. She also exhibited decreased flexibility throughout task, requiring Moderate verbal cues. During bed mobility, pt required Mod A multimodal cues for initiation and explicit 1-step directions from SLP to assist with repositioning herself. Moderate cues for repetition of utterances required during session due to very low vocal intensity, and pt was minimally responsive -  not effectively able to self monitor volume and whether or not she had changed it. Pt left semi-reclined in bed with alarm set and needs  within reach. Continue per current plan of care.         Pain Pain Assessment Pain Scale: 0-10 Pain Score: 0-No pain  Therapy/Group: Individual Therapy  Arbutus Leas 06/29/2020, 7:19 AM

## 2020-06-29 NOTE — Progress Notes (Signed)
Occupational Therapy Session Note  Patient Details  Name: Cheryl Ortiz MRN: [id number] Date of Birth: 01/14/1951  Today's Date: 06/29/2020 OT Individual Time: 4081-4481 OT Individual Time Calculation (min): 33 min    Short Term Goals: Week 1:  OT Short Term Goal 1 (Week 1): Pt will complete toilet transfer with min A during a stand pivot. OT Short Term Goal 2 (Week 1): Pt will don a shirt with verbal cues only. OT Short Term Goal 3 (Week 1): Pt will don pants with mod A or less. OT Short Term Goal 4 (Week 1): Pt will be able to actively use L arm to reach across and wash R arm. OT Short Term Goal 5 (Week 1): Pt will demonstrate improved L side awareness when stepping into the shower only needing 1 verbal cue to fully step to her L..  Skilled Therapeutic Interventions/Progress Updates:    Pt received siting in w/c with daughter April present, agreeable to therapy. C/o of L ankle pain, RN made aware and administered rx during session. Session focus on crafting Vday card for great nephew in prep for improved L side awareness, initiation, sustained attention to task, and LUE NMR/FMC. Transported to Day Room to work on card at desk, decorated items placed on pt's L side to increase awareness. Pt req consistent visual and VCs for L side awareness, task initiation, and task completion for each step (e.g., to place stamp in ink, to press down, to lift up, etc.) Pt wrote note in card on R side of paper in cramped style over pre-printed text with no awareness of illegibility. Max A from R hand to peel stickers and to press stamp down with enough pressure with L hand. Pt additionally read sympathy card from her room with max VC/visual cues to begin at each new line on L side. Pt transported back to her room in w/c and left in w/c with safety belt alarm engaged, call bell in reach, NT and daughter present, and all immediate needs met.    Therapy Documentation Precautions:  Precautions Precautions:  Fall Restrictions Weight Bearing Restrictions: No Pain: Pain Assessment Pain Scale: 0-10 Pain Score: 0-No pain ADL: See Care Tool for more details.   Therapy/Group: Individual Therapy  Volanda Napoleon MS, OTR/L  06/29/2020, 6:37 AM

## 2020-06-29 NOTE — Progress Notes (Signed)
Occupational Therapy Session Note  Patient Details  Name: Cheryl Ortiz MRN: [id number] Date of Birth: 02-Sep-1950  Today's Date: 06/29/2020 OT Individual Time: 1300-1353 OT Individual Time Calculation (min): 53 min    Short Term Goals: Week 1:  OT Short Term Goal 1 (Week 1): Pt will complete toilet transfer with min A during a stand pivot. OT Short Term Goal 2 (Week 1): Pt will don a shirt with verbal cues only. OT Short Term Goal 3 (Week 1): Pt will don pants with mod A or less. OT Short Term Goal 4 (Week 1): Pt will be able to actively use L arm to reach across and wash R arm. OT Short Term Goal 5 (Week 1): Pt will demonstrate improved L side awareness when stepping into the shower only needing 1 verbal cue to fully step to her L..  Skilled Therapeutic Interventions/Progress Updates:    Pt sitting up in w/c, dtr present for first half of session.  Pt asking dtr to put shoes on for her.  OT educated pt and dtr on benefits of increased pt participation to improve pt independence and progress towards goals.  Pt required step by step multimodal cues for sequencing and problem solving to donn shoes as well as mod physical assist with pt achieving figure 4 position to complete.  Also required increased time and frequent redirection to task due to pt easily distracted and discussing tangential topics.  Pt had incontinent episode of bowel and was aware of occurrence presently.  Pt transported to bathroom via w/c and completed stand pivot to 3 in 1 commode with use of grab bars and required mod assist and step by step multmodal cueing to sequence steps and weight shifting of transfer.  Pt required total assist for pericare and clothing mgt including changing brief.  Stand pivot from 3 in 1 commode to w/c using grab bars requiring mod assist and, despite max multimodal cues, pt unable to complete sequencing of transfer requiring OT to bring w/c to pt for safety.  Pt also having difficulty processing  left hand release from grab bar despite max cues requiring hand over hand to complete.  Pt washed hands sitting sinkside needing mod VCs for sequencing. Call bell in reach, seat belt alarm on.       Therapy Documentation Precautions:  Precautions Precautions: Fall Restrictions Weight Bearing Restrictions: No   Therapy/Group: Individual Therapy  Ezekiel Slocumb 06/29/2020, 4:28 PM

## 2020-06-29 NOTE — Progress Notes (Signed)
Physical Therapy Session Note  Patient Details  Name: Cheryl Ortiz MRN: [id number] Date of Birth: Sep 10, 1950  Today's Date: 06/29/2020 PT Individual Time: 1000-1053 PT Individual Time Calculation (min): 53 min   Short Term Goals: Week 1:  PT Short Term Goal 1 (Week 1): Patient to perform supine to sit with min A after cueing PT Short Term Goal 2 (Week 1): Patient to perform bed<>chair transfers with min A with cues PT Short Term Goal 3 (Week 1): Patient to initiate stair training. PT Short Term Goal 4 (Week 1): Patient to propel w/c 13' with min A for L side awareness.  Skilled Therapeutic Interventions/Progress Updates:     Pt greeted supine in bed, agreeable to therapy. She was endlessly swiping on her cell phone . She also believed her daughter was in her room despite therapist telling her several times. Pt continues to show significantly slow processing and very delayed initiation. Noted to have saturated brief, incontinent of bowel and bladder. NT present for +2 assist where pt required totalA for pericare and maxA for rolling L<>R in bed. Required totalA for donning pants while supine. Supine<>sit with mod/maxA with use of bed features. Sitting balance required modA due to posterior and L lateral lean with delayed righting reactions. Required maxA for donning a bra and minA for donning t-shirt. Provided her with RW and instructed her on standing but no initiation. She required face-to-face technique and maxA for initiation and lifting buttock where she was finally able to stand, requiring modA for standing balance and totalA for pulling pants over her hips. Stand<>pivot with no AD with modA to her w/c, towards L side. W/c transport for time management to ortho gym and wheeled to [employer] system to focus on L inattention via self paced visual pursuit. The first 2 trial she performed while using LUE only, but required hand-over-hand assist for reaching the targets. She also had significantly  impaired sustained attention and would frequently just look at her reflection from the BITS system rather than actively participating, requiring mod cues for redirection and attenuation to task. She also completed this with RUE, noted significantly improved reaction time (15 seconds with LUE, 3 seconds with RUE). Instructed patient on completing sit<>stand to RW, required maxA for initiation and mod/maxA for rising from chair. Once standing with RW support, able to stand with minA. Gait x5-21ft with min/modA and RW, demonstrating step-to gait pattern and decreased weight shift to her LLE. Pt defer's further gait distances due to worsening L ankle pain. Pt reports she has had L ankle pain that was prior to her stroke and she's seen several MD's regarding this where she's received injections and creams but nothing seemed to help. Difficult to reproduce pain with palpation or ROM of the ankle. Wheeled inside // bars but patient deferring further standing due to L ankle pain. Therefore, performed seated there-ex with 2# dowel rod: -bicep curls -chest press -shldr press *emphasis on attending to L hand/arm as she completed this due to her significant inattention.  Pt was returned to her room in her w/c and she remained seated in w/c with safety belt alarm on and needs within reach.  Therapy Documentation Precautions:  Precautions Precautions: Fall Restrictions Weight Bearing Restrictions: No General:    Therapy/Group: Individual Therapy  Cheree Fowles P Kyl Givler PT 06/29/2020, 7:36 AM

## 2020-06-30 DIAGNOSIS — I610 Nontraumatic intracerebral hemorrhage in hemisphere, subcortical: Secondary | ICD-10-CM

## 2020-06-30 DIAGNOSIS — M25572 Pain in left ankle and joints of left foot: Secondary | ICD-10-CM

## 2020-06-30 LAB — GLUCOSE, CAPILLARY
Glucose-Capillary: 155 mg/dL — ABNORMAL HIGH (ref 70–99)
Glucose-Capillary: 115 mg/dL — ABNORMAL HIGH (ref 70–99)
Glucose-Capillary: 123 mg/dL — ABNORMAL HIGH (ref 70–99)
Glucose-Capillary: 105 mg/dL — ABNORMAL HIGH (ref 70–99)

## 2020-06-30 MED ORDER — DICLOFENAC SODIUM 1 % EX GEL
2.0000 g | Freq: Three times a day (TID) | CUTANEOUS | Status: DC
  Administered 2020-06-30 – 2020-07-19 (×53): 2 g via TOPICAL
  Filled 2020-06-30 (×2): qty 100

## 2020-06-30 MED ORDER — METHYLPHENIDATE HCL 5 MG PO TABS
5.0000 mg | ORAL_TABLET | Freq: Two times a day (BID) | ORAL | Status: DC
  Administered 2020-07-01 – 2020-07-19 (×37): 5 mg via ORAL
  Filled 2020-06-30 (×37): qty 1

## 2020-06-30 NOTE — Progress Notes (Signed)
Physical Therapy Session Note  Patient Details  Name: Cheryl Ortiz MRN: [id number] Date of Birth: 06-Dec-1950  Today's Date: 06/30/2020 PT Individual Time: 1420-1530 PT Individual Time Calculation (min): 70 min   Short Term Goals: Week 1:  PT Short Term Goal 1 (Week 1): Patient to perform supine to sit with min A after cueing PT Short Term Goal 2 (Week 1): Patient to perform bed<>chair transfers with min A with cues PT Short Term Goal 3 (Week 1): Patient to initiate stair training. PT Short Term Goal 4 (Week 1): Patient to propel w/c 39' with min A for L side awareness.  Skilled Therapeutic Interventions/Progress Updates:     Pt received seated in WC, reporting that she had just been incontinent of bowel and needing help to clean up. PT and RN provide assistance. Pt stands to [place] with minA +2 initially. Pt performs several more stands to stedy with CGA +1, and remains standing with CGA while RN provides total assist for pericare. Pt also stands from elevated commode x1 with minA and Stedy. Following brief change and donning pants with totalA, pt transported to gym via [facility] for time management and energy conservation. Pt stands to RW with increased time and MinA. Pt marches in place x2 minutes, initially clearing L leg but with fatigue unable to clear L lower extremity. Following extended seated rest break pt stands, this time requiring modA and with impaired initiation. Pt ambulates x110' with RW and minA. Pt becomes easily distracted to activity in R visual field and requires frequent cueing to attend to task and to L visual field. Pt demos decreased eccentric control with stand to sit transition. WC transport back to room. Pt performs stand pivot transfer back to bed with modA. ModA for sit to supine. Pt left in bed with alarm intact and all needs within reach.  Therapy Documentation Precautions:  Precautions Precautions: Fall Restrictions Weight Bearing Restrictions:  No   Therapy/Group: Individual Therapy  Breck Coons, PT, DPT 06/30/2020, 3:08 PM

## 2020-06-30 NOTE — Progress Notes (Signed)
Occupational Therapy Session Note  Patient Details  Name: Cheryl Ortiz MRN: [id number] Date of Birth: [birth year]  Today's Date: 06/30/2020 OT Individual Time: 0658-2608 OT Individual Time Calculation (min): 57 min    Short Term Goals: Week 1:  OT Short Term Goal 1 (Week 1): Pt will complete toilet transfer with min A during a stand pivot. OT Short Term Goal 2 (Week 1): Pt will don a shirt with verbal cues only. OT Short Term Goal 3 (Week 1): Pt will don pants with mod A or less. OT Short Term Goal 4 (Week 1): Pt will be able to actively use L arm to reach across and wash R arm. OT Short Term Goal 5 (Week 1): Pt will demonstrate improved L side awareness when stepping into the shower only needing 1 verbal cue to fully step to her L..  Skilled Therapeutic Interventions/Progress Updates:    Pt received supine in bed, agreeable to therapy. Session focus on dressing, func mobility, and self-feeding. Reports L ankle pain, RN made aware and administered rx during session. Total A for roll >L, side lying > sitting EOB. Total A to maintain static sitting balance with posterior LOB back on the bed, req + 2 to transition back to supine. Donned pants bed level with total A + 2 to roll and return to sitting EOB. Pt progressed to sitting EOB with BUE support on RW with CGA. Total A to doff/don shirt 2/2 decreased balance/initiation. Attempted STS with + 2, but pt unable to initiate transfer. STS in stedy with initially + 3 to power up, progressing to powering up with CGA the second time. Pt requiring significantly increased time to initiate 2/2 poor initiation, external distraction, and poor attention to task. Stedy transfer to w/c. Presented with breakfast, req total A to self-feed 1 bite 2/2 poor initiation. RN made aware pt would like to cont to eat and req full supervision, tray removed from reach. Pt left in w/c with safety alarm engaged, call bell in reach, and all immediate needs met.     Therapy  Documentation Precautions:  Precautions Precautions: Fall Restrictions Weight Bearing Restrictions: No Pain: Pain Assessment Pain Scale: Faces Faces Pain Scale: Hurts a little bit Pain Type: Acute pain Pain Location: Ankle Pain Orientation: Left Pain Intervention(s): RN made aware ADL: See Care Tool for more details.  Therapy/Group: Individual Therapy  Volanda Napoleon MS, OTR/L  06/30/2020, 12:45 PM

## 2020-06-30 NOTE — Progress Notes (Signed)
Speech Language Pathology Daily Session Note  Patient Details  Name: Cheryl Ortiz MRN: [id number] Date of Birth: Jul 27, 1950  Today's Date: 06/30/2020 SLP Individual Time: 0900-1000 SLP Individual Time Calculation (min): 60 min  Short Term Goals: Week 1: SLP Short Term Goal 1 (Week 1): Pt will self-monitor her vocal intensity, tone, and eye contact (pragmatic communication skills) to increase intelligibility to 90% in conversaton with Min A cues. SLP Short Term Goal 2 (Week 1): Pt will demonstrate ability to problme solving basic to mildly complex tasks with Min A verbal/visual cues. SLP Short Term Goal 3 (Week 1): Pt will selectively attend to functional tasks with Min A verbal/visual cues for redirection. SLP Short Term Goal 4 (Week 1): Pt will demonstrate awareness of functional errors with Min A cues.  Skilled Therapeutic Interventions:   Patient seen for skilled ST session focusing on cognitive-linguistic goals. Patient was 78% accurate with noticing 3 differences between two photos with min-mod cues for attention. She answered story recall questions after she read paragraph long story aloud and was 60% accurate as she would often give answers that were not part of the choices and would make assumptions that were not supported in stories. SLP also had to provide mod A cues for attention and at times patient would close her eyes. During semi-structured conversation, patient was very tangential, continues to speak in a monotone voice and only intermittently would make eye contact with SLP. She did not demonstrate any self-monitoring or awareness to SLP in terms of being a conversational partner. Patient required modA cues overall to perform tasks and did not exhibit adequate insight into her performance or errors. She continues to benefit from skilled SLP intervention to maximize cognitive-linguistic functioning prior to discharge.  Pain Pain Assessment Pain Scale: 0-10 Pain Score: 0-No  pain Faces Pain Scale: Hurts a little bit Pain Type: Acute pain Pain Location: Ankle Pain Orientation: Left Pain Intervention(s): RN made aware  Therapy/Group: Individual Therapy  Sonia Baller, MA, CCC-SLP Speech Therapy

## 2020-06-30 NOTE — Progress Notes (Signed)
[place] PHYSICAL MEDICINE & REHABILITATION PROGRESS NOTE  Subjective/Complaints: Pt up in bed. Therapy about to work with her. Says that her left ankle is swollen and tender. Started last night.   ROS: Patient denies fever, rash, sore throat, blurred vision, nausea, vomiting, diarrhea, cough, shortness of breath or chest pain,   headache, or mood change.    Objective: Vital Signs: Blood pressure 118/73, pulse 96, temperature 98 F (36.7 C), resp. rate 18, height 5\' 6"  (1.676 m), weight 101.8 kg, SpO2 96 %. CT HEAD WO CONTRAST  Result Date: 06/28/2020 CLINICAL DATA:  Intracranial hemorrhage. EXAM: CT HEAD WITHOUT CONTRAST TECHNIQUE: Contiguous axial images were obtained from the base of the skull through the vertex without intravenous contrast. COMPARISON:  CT head 06/23/2020. FINDINGS: Brain: High-density hematoma in the right frontal lobe is unchanged in size. There is increased surrounding edema. No midline shift. No new area of hemorrhage. Ventricle size normal. No midline shift. Small chronic infarct right cerebellum Vascular: Negative for hyperdense vessel Skull: Negative Sinuses/Orbits: Paranasal sinuses clear.  Negative orbit Other: None IMPRESSION: Moderate large right frontal hematoma unchanged in size. Increased surrounding edema and local mass-effect. No midline shift. Electronically Signed   By: Franchot Gallo M.D.   On: 06/28/2020 12:34   No results for input(s): WBC, HGB, HCT, PLT in the last 72 hours. Recent Labs    06/29/20 0449  NA 136  K 3.3*  CL 100  CO2 24  GLUCOSE 140*  BUN 21  CREATININE 1.03*  CALCIUM 9.4    Intake/Output Summary (Last 24 hours) at 06/30/2020 1200 Last data filed at 06/29/2020 1844 Gross per 24 hour  Intake 296 ml  Output --  Net 296 ml        Physical Exam: BP 118/73 (BP Location: Left Arm)   Pulse 96   Temp 98 F (36.7 C)   Resp 18   Ht 5\' 6"  (1.676 m)   Wt 101.8 kg   SpO2 96%   BMI 36.22 kg/m  Constitutional: No distress  . Vital signs reviewed. HEENT: EOMI, oral membranes moist Neck: supple Cardiovascular: RRR without murmur. No JVD    Respiratory/Chest: CTA Bilaterally without wheezes or rales. Normal effort    GI/Abdomen: BS +, non-tender, non-distended Ext: no clubbing, cyanosis, or edema Psych: flat, slowed Musc: sl swelling along left lateral malleolus. Area somewhat tender to palpation and ROM. No warmth or discoloration Neuro:  Alert, no focal CN findings Right lean, unchanged Motor: Left upper extremity: 4-/5 proximal distal Left lower extremity: 2 - /5 proximal distal  Assessment/Plan: 1. Functional deficits which require 3+ hours per day of interdisciplinary therapy in a comprehensive inpatient rehab setting.  Physiatrist is providing close team supervision and 24 hour management of active medical problems listed below.  Physiatrist and rehab team continue to assess barriers to discharge/monitor patient progress toward functional and medical goals   Care Tool:  Bathing    Body parts bathed by patient: Left arm,Chest,Abdomen,Front perineal area,Buttocks,Right upper leg,Left upper leg,Face   Body parts bathed by helper: Left lower leg,Right arm,Right lower leg     Bathing assist Assist Level: Moderate Assistance - Patient 50 - 74%     Upper Body Dressing/Undressing Upper body dressing   What is the patient wearing?: Pull over shirt    Upper body assist Assist Level: Maximal Assistance - Patient 25 - 49%    Lower Body Dressing/Undressing Lower body dressing      What is the patient wearing?: Pants,Incontinence brief  Lower body assist Assist for lower body dressing: Maximal Assistance - Patient 25 - 49%     Toileting Toileting    Toileting assist Assist for toileting: Total Assistance - Patient < 25%     Transfers Chair/bed transfer  Transfers assist     Chair/bed transfer assist level: Maximal Assistance - Patient 25 - 49%      Locomotion Ambulation   Ambulation assist      Assist level: Minimal Assistance - Patient > 75% Assistive device: Walker-rolling Max distance: 160'   Walk 10 feet activity   Assist     Assist level: Minimal Assistance - Patient > 75% Assistive device: Walker-rolling   Walk 50 feet activity   Assist    Assist level: Minimal Assistance - Patient > 75% Assistive device: Walker-rolling    Walk 150 feet activity   Assist Walk 150 feet activity did not occur: Safety/medical concerns  Assist level: Minimal Assistance - Patient > 75% Assistive device: Walker-rolling    Walk 10 feet on uneven surface  activity   Assist Walk 10 feet on uneven surfaces activity did not occur: Safety/medical concerns   Assist level: Minimal Assistance - Patient > 75% Assistive device: Aeronautical engineer Will patient use wheelchair at discharge?: No      Wheelchair assist level: Dependent - Patient 0% Max wheelchair distance: 200    Wheelchair 50 feet with 2 turns activity    Assist        Assist Level: Dependent - Patient 0%   Wheelchair 150 feet activity     Assist      Assist Level: Dependent - Patient 0%   BP 118/73 (BP Location: Left Arm)   Pulse 96   Temp 98 F (36.7 C)   Resp 18   Ht 5\' 6"  (1.676 m)   Wt 101.8 kg   SpO2 96%   BMI 36.22 kg/m   Medical Problem List and Plan: 1.  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  Continue CIR  Repeat stat CT showing unchanged size of hematoma.  No changes per neurology. 2.  Antithrombotics: -DVT/anticoagulation: SCDs             -antiplatelet therapy: N/A 3. Pain Management: Tylenol as needed  -2/12 mild OA left ankle?   -add voltaren gel bid 4. Mood: Provide emotional support             -antipsychotic agents: N/A 5. Neuropsych: This  patient is not fully capable of making decisions on her own behalf.  Trial low-dose Ritalin  2/12 increase ritalin to 5mg  bid 6. Skin/Wound Care: Routine skin checks 7. Fluids/Electrolytes/Nutrition: Routine in and outs 8.  Diabetes mellitus type II with hyperglycemia.  Hemoglobin A1c 6.2.  Continue Glucophage 1000 mg daily.    Sl increase. Still under fair control--observe for now    9.  Hypertension.   HCTZ 25 mg daily, decreased to 12.5 on 2/10  Controlled on 2/12  Monitor with increased mobility 10.  History of kidney stones with stent placement 2017 11. Obesity (BMI 36.48): Provide dietary counseling 12.  Hypokalemia  Potassium 3.3 on 2/11, labs ordered for Monday  Supplemented x2 days  Continue to monitor  LOS: 5 days A FACE TO FACE EVALUATION WAS PERFORMED  Cheryl Ortiz 06/30/2020, 12:00 PM

## 2020-07-01 LAB — GLUCOSE, CAPILLARY
Glucose-Capillary: 149 mg/dL — ABNORMAL HIGH (ref 70–99)
Glucose-Capillary: 99 mg/dL (ref 70–99)
Glucose-Capillary: 118 mg/dL — ABNORMAL HIGH (ref 70–99)
Glucose-Capillary: 129 mg/dL — ABNORMAL HIGH (ref 70–99)

## 2020-07-01 MED ORDER — LOPERAMIDE HCL 2 MG PO CAPS
2.0000 mg | ORAL_CAPSULE | ORAL | Status: DC | PRN
  Administered 2020-07-01 – 2020-07-11 (×14): 2 mg via ORAL
  Filled 2020-07-01 (×14): qty 1

## 2020-07-01 NOTE — Progress Notes (Signed)
Left ankle pain better per patient-scheduled voltaren gel applied. Flat affect. Incontinent of urine, requiring max assist to change. Cheryl Ortiz A

## 2020-07-01 NOTE — Progress Notes (Signed)
[place] PHYSICAL MEDICINE & REHABILITATION PROGRESS NOTE  Subjective/Complaints: No new issues. Left ankle less tender.   ROS: Limited due to cognitive/behavioral    Objective: Vital Signs: Blood pressure 126/72, pulse 95, temperature 98.2 F (36.8 C), resp. rate 15, height 5\' 6"  (1.676 m), weight 101.8 kg, SpO2 92 %. No results found. No results for input(s): WBC, HGB, HCT, PLT in the last 72 hours. Recent Labs    06/29/20 0449  NA 136  K 3.3*  CL 100  CO2 24  GLUCOSE 140*  BUN 21  CREATININE 1.03*  CALCIUM 9.4    Intake/Output Summary (Last 24 hours) at 07/01/2020 1030 Last data filed at 07/01/2020 0735 Gross per 24 hour  Intake 775 ml  Output --  Net 775 ml        Physical Exam: BP 126/72 (BP Location: Left Arm)   Pulse 95   Temp 98.2 F (36.8 C)   Resp 15   Ht 5\' 6"  (1.676 m)   Wt 101.8 kg   SpO2 92%   BMI 36.22 kg/m  Constitutional: No distress . Vital signs reviewed. HEENT: EOMI, oral membranes moist Neck: supple Cardiovascular: RRR without murmur. No JVD    Respiratory/Chest: CTA Bilaterally without wheezes or rales. Normal effort    GI/Abdomen: BS +, non-tender, non-distended Ext: no clubbing, cyanosis, or edema Psych: flat, slowed Musc: sl swelling along left lateral malleolus, less tender to palpation and ROM. No warmth or discoloration Neuro:  Alert, no focal CN findings, delayed processing.  Right lean, unchanged Motor: Left upper extremity: 4-/5 proximal distal Left lower extremity: 2 - /5 proximal distal  Assessment/Plan: 1. Functional deficits which require 3+ hours per day of interdisciplinary therapy in a comprehensive inpatient rehab setting.  Physiatrist is providing close team supervision and 24 hour management of active medical problems listed below.  Physiatrist and rehab team continue to assess barriers to discharge/monitor patient progress toward functional and medical goals   Care Tool:  Bathing    Body parts bathed  by patient: Left arm,Chest,Abdomen,Front perineal area,Buttocks,Right upper leg,Left upper leg,Face   Body parts bathed by helper: Left lower leg,Right arm,Right lower leg     Bathing assist Assist Level: Moderate Assistance - Patient 50 - 74%     Upper Body Dressing/Undressing Upper body dressing   What is the patient wearing?: Pull over shirt    Upper body assist Assist Level: Total Assistance - Patient < 25%    Lower Body Dressing/Undressing Lower body dressing      What is the patient wearing?: Pants     Lower body assist Assist for lower body dressing: 2 Helpers     Toileting Toileting    Toileting assist Assist for toileting: Total Assistance - Patient < 25%     Transfers Chair/bed transfer  Transfers assist     Chair/bed transfer assist level: Dependent - mechanical lift (stedy)     Locomotion Ambulation   Ambulation assist      Assist level: Minimal Assistance - Patient > 75% Assistive device: Walker-rolling Max distance: 110'   Walk 10 feet activity   Assist     Assist level: Minimal Assistance - Patient > 75% Assistive device: Walker-rolling   Walk 50 feet activity   Assist    Assist level: Minimal Assistance - Patient > 75% Assistive device: Walker-rolling    Walk 150 feet activity   Assist Walk 150 feet activity did not occur: Safety/medical concerns  Assist level: Minimal Assistance - Patient > 75%  Assistive device: Walker-rolling    Walk 10 feet on uneven surface  activity   Assist Walk 10 feet on uneven surfaces activity did not occur: Safety/medical concerns   Assist level: Minimal Assistance - Patient > 75% Assistive device: Aeronautical engineer Will patient use wheelchair at discharge?: No      Wheelchair assist level: Dependent - Patient 0% Max wheelchair distance: 200    Wheelchair 50 feet with 2 turns activity    Assist        Assist Level: Dependent - Patient 0%    Wheelchair 150 feet activity     Assist      Assist Level: Dependent - Patient 0%   BP 126/72 (BP Location: Left Arm)   Pulse 95   Temp 98.2 F (36.8 C)   Resp 15   Ht 5\' 6"  (1.676 m)   Wt 101.8 kg   SpO2 92%   BMI 36.22 kg/m   Medical Problem List and Plan: 1.  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  Continue CIR  Repeat stat CT showing unchanged size of hematoma.  No changes per neurology. 2.  Antithrombotics: -DVT/anticoagulation: SCDs             -antiplatelet therapy: N/A 3. Pain Management: Tylenol as needed  -2/13 mild OA left ankle?   -addedvoltaren gel bid 2/12 with improvement 4. Mood: Provide emotional support             -antipsychotic agents: N/A 5. Neuropsych: This patient is not fully capable of making decisions on her own behalf.  Trial low-dose Ritalin  2/12 increased ritalin to 5mg  bid 6. Skin/Wound Care: Routine skin checks 7. Fluids/Electrolytes/Nutrition: Routine in and outs 8.  Diabetes mellitus type II with hyperglycemia.  Hemoglobin A1c 6.2.  Continue Glucophage 1000 mg daily.    Fair control--observe for now   CBG (last 3)  Recent Labs    06/30/20 1647 06/30/20 2103 07/01/20 0606  GLUCAP 123* 105* 149*    9.  Hypertension.   HCTZ 25 mg daily, decreased to 12.5 on 2/10  Controlled on 2/13  Monitor with increased mobility 10.  History of kidney stones with stent placement 2017 11. Obesity (BMI 36.48): Provide dietary counseling 12.  Hypokalemia  Potassium 3.3 on 2/11, labs ordered for Monday  Supplemented x2 days  Continue to monitor  LOS: 6 days A FACE TO FACE EVALUATION WAS PERFORMED  Meredith Staggers 07/01/2020, 10:30 AM

## 2020-07-01 NOTE — Plan of Care (Signed)
  Problem: RH SKIN INTEGRITY Goal: RH STG MAINTAIN SKIN INTEGRITY WITH ASSISTANCE Description: STG Maintain Skin Integrity With Supervision Assistance. Flowsheets (Taken 07/01/2020 0533) STG: Maintain skin integrity with assistance: 3-Moderate assistance   Problem: RH SAFETY Goal: RH STG ADHERE TO SAFETY PRECAUTIONS W/ASSISTANCE/DEVICE Description: STG Adhere to Safety Precautions With supervisionAssistance/Device. Flowsheets (Taken 07/01/2020 0533) STG:Pt will adhere to safety precautions with assistance/device: 4-Minimal assistance   Problem: RH PAIN MANAGEMENT Goal: RH STG PAIN MANAGED AT OR BELOW PT'S PAIN GOAL Description: <3 on a 0-10 pain scale. Note: Pain < 2 on pain scale

## 2020-07-02 LAB — GLUCOSE, CAPILLARY
Glucose-Capillary: 134 mg/dL — ABNORMAL HIGH (ref 70–99)
Glucose-Capillary: 113 mg/dL — ABNORMAL HIGH (ref 70–99)
Glucose-Capillary: 113 mg/dL — ABNORMAL HIGH (ref 70–99)
Glucose-Capillary: 125 mg/dL — ABNORMAL HIGH (ref 70–99)

## 2020-07-02 LAB — BASIC METABOLIC PANEL
Anion gap: 12 (ref 5–15)
BUN: 22 mg/dL (ref 8–23)
CO2: 23 mmol/L (ref 22–32)
Calcium: 9.4 mg/dL (ref 8.9–10.3)
Chloride: 101 mmol/L (ref 98–111)
Creatinine, Ser: 0.95 mg/dL (ref 0.44–1.00)
GFR, Estimated: >60 mL/min (ref >60)
Glucose, Bld: 139 mg/dL — ABNORMAL HIGH (ref 70–99)
Potassium: 3.7 mmol/L (ref 3.5–5.1)
Sodium: 136 mmol/L (ref 135–145)

## 2020-07-02 NOTE — Progress Notes (Signed)
Occupational Therapy Session Note  Patient Details  Name: Cheryl Ortiz MRN: [id number] Date of Birth: [birth year]  Today's Date: 07/02/2020 OT Individual Time: 1430-1530 OT Individual Time Calculation (min): 60 min    Short Term Goals: Week 1:  OT Short Term Goal 1 (Week 1): Pt will complete toilet transfer with min A during a stand pivot. OT Short Term Goal 2 (Week 1): Pt will don a shirt with verbal cues only. OT Short Term Goal 3 (Week 1): Pt will don pants with mod A or less. OT Short Term Goal 4 (Week 1): Pt will be able to actively use L arm to reach across and wash R arm. OT Short Term Goal 5 (Week 1): Pt will demonstrate improved L side awareness when stepping into the shower only needing 1 verbal cue to fully step to her L..  Skilled Therapeutic Interventions/Progress Updates:    Pt sitting up in w/c, no c/o pain, reports she already washed up with nursing.  OT session focused on standing tolerance/balance, functional transfer training, following commands, sustained and focused attention skills, and problem solving.  Pt transported to gym via w/c.  Pt participated in tabletop activities including memory card game and thumb tack tracing task in standing.  Pt initially apprehensive about standing for tasks stating "Ive already done a lot of up and down today", however with encouragement provided by OT, pt agreeable to try.  Pt required min assist for sit<>stand and increased time/ repetitive cueing due to slow processing. CGA for static standing with unilateral UE support at table to maintain balance.  Pt also required max multimodal cues to follow one step commands and downgrading of card task to reduce amount of total cards due to pt attempting to turn all cards over at one time despite cueing.  Pt had increased success with 4 pairs instead of 8 pairs of matching cards however still required max multimodal cueing to follow direction.  Pt also required same amount of cueing to complete  thumb tack task to reduce distractibility.  Pt requesting back to bed at end of session.  Min assist stand pivot with RW to EOB and pt requiring mod assist for stand to sit due to pt unable to initiate movement despite max multimodal cueing.  Max assist sit to supine.  Call bell in reach, bed alarm on.  Therapy Documentation Precautions:  Precautions Precautions: Fall Restrictions Weight Bearing Restrictions: No   Therapy/Group: Individual Therapy  Ezekiel Slocumb 07/02/2020, 12:55 PM

## 2020-07-02 NOTE — Progress Notes (Signed)
Speech Language Pathology Daily Session Note  Patient Details  Name: Cheryl Ortiz MRN: [id number] Date of Birth: 1951-01-22  Today's Date: 07/02/2020 SLP Individual Time: 1443-1540 SLP Individual Time Calculation (min): 25 min  Short Term Goals: Week 1: SLP Short Term Goal 1 (Week 1): Pt will self-monitor her vocal intensity, tone, and eye contact (pragmatic communication skills) to increase intelligibility to 90% in conversaton with Min A cues. SLP Short Term Goal 2 (Week 1): Pt will demonstrate ability to problme solving basic to mildly complex tasks with Min A verbal/visual cues. SLP Short Term Goal 3 (Week 1): Pt will selectively attend to functional tasks with Min A verbal/visual cues for redirection. SLP Short Term Goal 4 (Week 1): Pt will demonstrate awareness of functional errors with Min A cues.  Skilled Therapeutic Interventions: Pt was seen for skilled ST targeting speech, voice, and pragmatic communication skills. Pt still with very monotone, low intensity voice that reduces her intelligibility. She unable to self monitor or correct loudness with only Max A verbal cues from clinical. She required use of a phone app (Bla, Bla, Bla) that provided visual biofeedback (dots that get larger and smaller with changes vocal intensity and in intonation) for any functional change in loudness. In combination with the App as visual feedback, she still required Max A verbal models to repeat words and phrases with increased intensity for greater intelligibility. Moderate verbal cues also required for eye contact throughout conversation and other interactions during session - very few instances of true appropriate turn taking and eye contact during session. Pt also continues to be internally and externally distracted, requiring Moderate verbal cues for redirection to tasks, eliminating distractions, and maintaining topics of conversation. Pt left sitting in wheelchair with alarm set and needs within  reach. Continue per current plan of care.      Pain Pain Assessment Pain Scale: 0-10 Pain Score: 0-No pain  Therapy/Group: Individual Therapy  Arbutus Leas 07/02/2020, 7:21 AM

## 2020-07-02 NOTE — Progress Notes (Signed)
[place] PHYSICAL MEDICINE & REHABILITATION PROGRESS NOTE  Subjective/Complaints: Patient seen laying in bed this morning.  She states she slept well overnight.  She states she is eager to start therapies today so that she can go home ASAP.  She is more interactive today.  ROS: Limited due to cognition, but appears to deny CP, shortness of breath, nausea, vomiting and diarrhea.   Objective: Vital Signs: Blood pressure 105/82, pulse 91, temperature 98.7 F (37.1 C), temperature source Oral, resp. rate 16, height 5\' 6"  (1.676 m), weight 101.8 kg, SpO2 92 %. No results found. No results for input(s): WBC, HGB, HCT, PLT in the last 72 hours. Recent Labs    07/02/20 0532  NA 136  K 3.7  CL 101  CO2 23  GLUCOSE 139*  BUN 22  CREATININE 0.95  CALCIUM 9.4    Intake/Output Summary (Last 24 hours) at 07/02/2020 1307 Last data filed at 07/02/2020 0932 Gross per 24 hour  Intake 838 ml  Output --  Net 838 ml        Physical Exam: BP 105/82 (BP Location: Left Arm)   Pulse 91   Temp 98.7 F (37.1 C) (Oral)   Resp 16   Ht 5\' 6"  (1.676 m)   Wt 101.8 kg   SpO2 92%   BMI 36.22 kg/m  Constitutional: No distress . Vital signs reviewed. HENT: Normocephalic.  Atraumatic. Eyes: EOMI. No discharge. Cardiovascular: No JVD.  RRR. Respiratory: Normal effort.  No stridor.  Bilateral clear to auscultation. GI: Non-distended.  BS +. Skin: Warm and dry.  Intact. Psych: Flat.  Slowed. Musc: No edema in extremities.  No tenderness in extremities. Neuro: Alert Right lean, unchanged Motor: Left upper extremity: 4-/5 proximal distal Left lower extremity: 2 - /5 proximal distal, unchanged  Assessment/Plan: 1. Functional deficits which require 3+ hours per day of interdisciplinary therapy in a comprehensive inpatient rehab setting.  Physiatrist is providing close team supervision and 24 hour management of active medical problems listed below.  Physiatrist and rehab team continue to  assess barriers to discharge/monitor patient progress toward functional and medical goals   Care Tool:  Bathing    Body parts bathed by patient: Left arm,Chest,Abdomen,Front perineal area,Buttocks,Right upper leg,Left upper leg,Face   Body parts bathed by helper: Left lower leg,Right arm,Right lower leg     Bathing assist Assist Level: Moderate Assistance - Patient 50 - 74%     Upper Body Dressing/Undressing Upper body dressing   What is the patient wearing?: Pull over shirt    Upper body assist Assist Level: Total Assistance - Patient < 25%    Lower Body Dressing/Undressing Lower body dressing      What is the patient wearing?: Pants     Lower body assist Assist for lower body dressing: 2 Helpers     Toileting Toileting    Toileting assist Assist for toileting: Total Assistance - Patient < 25%     Transfers Chair/bed transfer  Transfers assist     Chair/bed transfer assist level: Dependent - mechanical lift (stedy)     Locomotion Ambulation   Ambulation assist      Assist level: Minimal Assistance - Patient > 75% Assistive device: Walker-rolling Max distance: 110'   Walk 10 feet activity   Assist     Assist level: Minimal Assistance - Patient > 75% Assistive device: Walker-rolling   Walk 50 feet activity   Assist    Assist level: Minimal Assistance - Patient > 75% Assistive device: Walker-rolling  Walk 150 feet activity   Assist Walk 150 feet activity did not occur: Safety/medical concerns  Assist level: Minimal Assistance - Patient > 75% Assistive device: Walker-rolling    Walk 10 feet on uneven surface  activity   Assist Walk 10 feet on uneven surfaces activity did not occur: Safety/medical concerns   Assist level: Minimal Assistance - Patient > 75% Assistive device: Aeronautical engineer Will patient use wheelchair at discharge?: No      Wheelchair assist level: Dependent - Patient 0% Max  wheelchair distance: 200    Wheelchair 50 feet with 2 turns activity    Assist        Assist Level: Dependent - Patient 0%   Wheelchair 150 feet activity     Assist      Assist Level: Dependent - Patient 0%   Medical Problem List and Plan: 1.  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  Continue CIR  Repeat stat CT showing unchanged size of hematoma.  No changes per neurology. 2.  Antithrombotics: -DVT/anticoagulation: SCDs             -antiplatelet therapy: N/A 3. Pain Management: Tylenol as needed  -Voltaren gel to left ankle with improvement 4. Mood: Provide emotional support             -antipsychotic agents: N/A 5. Neuropsych: This patient is not fully capable of making decisions on her own behalf.  Ritalin increased to 5 mg twice daily on 2/12 with improvement 6. Skin/Wound Care: Routine skin checks 7. Fluids/Electrolytes/Nutrition: Routine in and outs 8.  Diabetes mellitus type II with hyperglycemia.  Hemoglobin A1c 6.2.  Continue Glucophage 1000 mg daily.     CBG (last 3)  Recent Labs    07/01/20 2056 07/02/20 0600 07/02/20 1145  GLUCAP 129* 134* 113*   Mildly elevated on 2/14 9.  Hypertension.   HCTZ 25 mg daily, decreased to 12.5 on 2/10, DC'd on 2/15  Remains soft on 2/14  Monitor with increased mobility 10.  History of kidney stones with stent placement 2017 11. Obesity (BMI 36.48): Provide dietary counseling 12.  Hypokalemia  Potassium 3.7 on 2/14  Supplemented x2 days on 2/11-2/12  Continue to monitor  LOS: 7 days A FACE TO FACE EVALUATION WAS PERFORMED  Brittie Whisnant Lorie Phenix 07/02/2020, 1:07 PM

## 2020-07-02 NOTE — Progress Notes (Signed)
Physical Therapy Session Note  Patient Details  Name: Cheryl Ortiz MRN: [id number] Date of Birth: Sep 15, 1950  Today's Date: 07/02/2020 PT Individual Time: 1000-1058 PT Individual Time Calculation (min): 58 min   Short Term Goals: Week 1:  PT Short Term Goal 1 (Week 1): Patient to perform supine to sit with min A after cueing PT Short Term Goal 2 (Week 1): Patient to perform bed<>chair transfers with min A with cues PT Short Term Goal 3 (Week 1): Patient to initiate stair training. PT Short Term Goal 4 (Week 1): Patient to propel w/c 51' with min A for L side awareness.  Skilled Therapeutic Interventions/Progress Updates:    Pt received supine in bed, playing on her phone, awake and agreeable to therapy. Continues to show flat affect during session with limited eye contact and significant L inattention. Pt incontinent of bladder with heavily saturated brief. Required totalA for removing her brief and pericare. Patient requesting to use her pesonal washcloths for this as hospital ones are too rough. Placed her used washcloth's in clean trash bag for daughter to take home. Required max/totalA for donning pants while supine. MaxA for rolling L<>R with use of hospital bed features. Very delayed processing and initiation. Supine<>sit with maxA with use of bed features, requires minA for sitting balance due to L/posterior lean. Unable to stand from lowered EOB height to RW so raised EOB where she was able to stand with minA to RW but required mod cues for initiation. Required maxA for pulling pants over hips in standing and then stand<>pivot with min/modA and RW to w/c. Donned her tennis shoes with totalA for time management and noted her to have L foot orthotic which patient reports assist with foot alignment and comfort. W/c transport for time management to main rehab gym. Focused remainder of session on functional gait training. Required heavy minA for standing from w/c to RW with max cues for  initiation. Ambulated ~19ft with minA and RW, focusing on turns to the L and attending to L. She required mod cues for safety approach to her chair and has difficulty with RW management in tight spaces. 2nd gait trial, placed x6 bright orange cones along hand rails on both L and R side. She was able to identify 5/6 cones and required min cues for attending to L for retrieval, again requiring min/modA for safety approach to her w/c. Pt returned to her room with totalA and she remained seated in w/c with safety belt alarm on, her needs within reach.  Therapy Documentation Precautions:  Precautions Precautions: Fall Restrictions Weight Bearing Restrictions: No General:    Therapy/Group: Individual Therapy   Kamyla Olejnik P Ronrico Dupin PT 07/02/2020, 7:31 AM

## 2020-07-02 NOTE — Progress Notes (Signed)
Occupational Therapy Session Note  Patient Details  Name: Cheryl Ortiz MRN: [id number] Date of Birth: 1950-06-29  Today's Date: 07/02/2020 OT Individual Time: 1300-1327 OT Individual Time Calculation (min): 27 min    Short Term Goals: Week 1:  OT Short Term Goal 1 (Week 1): Pt will complete toilet transfer with min A during a stand pivot. OT Short Term Goal 2 (Week 1): Pt will don a shirt with verbal cues only. OT Short Term Goal 3 (Week 1): Pt will don pants with mod A or less. OT Short Term Goal 4 (Week 1): Pt will be able to actively use L arm to reach across and wash R arm. OT Short Term Goal 5 (Week 1): Pt will demonstrate improved L side awareness when stepping into the shower only needing 1 verbal cue to fully step to her L..   Skilled Therapeutic Interventions/Progress Updates:    Pt greeted at time of session sitting up in wheelchair with daughter present stating that pt needed brief change. NT provided Stedy, sit to stand at [facility] with Mod A, stedy transfer to [facility] over top of toilet. Total A for all toileting tasks, difficulty sequencing and attending to clothing management to don/doff and hygiene with wipes from home requiring extended time to thoroughly clean the pt. Pt able to stand at [facility] for approx 2-3 minutes allowing therapist time to clean buttocks. Note clothes soiled, donned new brief and pants with total A, but pt did attempt to help when pulling back over hips. Stedy transfer back to wheelchair, alarm on call bell in reach. Decreased/delayed processing throughout session and able to perform sit > stands with improved ability with increased time.   Therapy Documentation Precautions:  Precautions Precautions: Fall Restrictions Weight Bearing Restrictions: No    Therapy/Group: Individual Therapy  Viona Gilmore 07/02/2020, 4:53 PM

## 2020-07-02 NOTE — Progress Notes (Signed)
Pt received new tray NT attempted to feed pt without success. Writer then went in and spoke with patient regarding nutritional needs and risks of poor nutrition. Pt states she does not want to eat anything , when asked if she would like to eat at least some pasta or a roll pt stated "there's too much carbs I don't eat that much carbs." " I would like a protein drink".

## 2020-07-03 LAB — GLUCOSE, CAPILLARY
Glucose-Capillary: 131 mg/dL — ABNORMAL HIGH (ref 70–99)
Glucose-Capillary: 118 mg/dL — ABNORMAL HIGH (ref 70–99)
Glucose-Capillary: 136 mg/dL — ABNORMAL HIGH (ref 70–99)
Glucose-Capillary: 118 mg/dL — ABNORMAL HIGH (ref 70–99)

## 2020-07-03 NOTE — Progress Notes (Signed)
Patient ID: Cheryl Ortiz, female   DOB: 01-26-1951, 70 y.o.   MRN: [id number]  [place] forms completed and faxed in. Have left original in pt's room-pt aware and will make sure daughter receives them.

## 2020-07-03 NOTE — Plan of Care (Signed)
  Problem: RH Balance Goal: LTG Patient will maintain dynamic sitting balance (PT) Description: LTG:  Patient will maintain dynamic sitting balance with assistance during mobility activities (PT) Flowsheets (Taken 07/03/2020 1246) LTG: Pt will maintain dynamic sitting balance during mobility activities with:: Contact Guard/Touching assist Goal: LTG Patient will maintain dynamic standing balance (PT) Description: LTG:  Patient will maintain dynamic standing balance with assistance during mobility activities (PT) Flowsheets (Taken 07/03/2020 1246) LTG: Pt will maintain dynamic standing balance during mobility activities with:: Contact Guard/Touching assist   Problem: RH Bed to Chair Transfers Goal: LTG Patient will perform bed/chair transfers w/assist (PT) Description: LTG: Patient will perform bed to chair transfers with assistance (PT). Flowsheets (Taken 07/03/2020 1246) LTG: Pt will perform Bed to Chair Transfers with assistance level: Contact Guard/Touching assist   Problem: RH Furniture Transfers Goal: LTG Patient will perform furniture transfers w/assist (OT/PT) Description: LTG: Patient will perform furniture transfers  with assistance (OT/PT). Flowsheets (Taken 07/03/2020 1246) LTG: Pt will perform furniture transfers with assist:: Minimal Assistance - Patient > 75%

## 2020-07-03 NOTE — Progress Notes (Signed)
Occupational Therapy Session Note  Patient Details  Name: Cheryl Ortiz MRN: [id number] Date of Birth: 1950/06/05  Today's Date: 07/03/2020 OT Individual Time: 1000-1100 OT Individual Time Calculation (min): 60 min    Short Term Goals: Week 1:  OT Short Term Goal 1 (Week 1): Pt will complete toilet transfer with min A during a stand pivot. OT Short Term Goal 2 (Week 1): Pt will don a shirt with verbal cues only. OT Short Term Goal 3 (Week 1): Pt will don pants with mod A or less. OT Short Term Goal 4 (Week 1): Pt will be able to actively use L arm to reach across and wash R arm. OT Short Term Goal 5 (Week 1): Pt will demonstrate improved L side awareness when stepping into the shower only needing 1 verbal cue to fully step to her L..  Skilled Therapeutic Interventions/Progress Updates:    Pt in bathroom having just finished continent episode of bowel, ready for toileting. Pt required total assist for standing pericare due to pt initially attempting but unable to complete thorough reach. Stand pivot completed using grab bars for balance needing min assist and max multimodal cueing for motor planning. Pt completed UB bathing and dressing requiring min assist and step by step VCs to stay on task and for sequencing. Pt also needing frequent VCs to attend to left UE during all self care.  Min assist needed to position in figure 4 of LLE due to weakness for LB bathing.  Pt required mod assist to don pants and max assist for brief.  Pt brushed teeth standing at sink with min assist.  Donned sneakers with min assist RLE and max assist LLE with pt having difficulty attending to task needing frequent redirection.  Call bell in reach, seat alarm on.  Pt significantly limited by poor attention and motor planning during self care.    Therapy Documentation Precautions:  Precautions Precautions: Fall Restrictions Weight Bearing Restrictions: No   Therapy/Group: Individual Therapy  Ezekiel Slocumb 07/03/2020, 12:53 PM

## 2020-07-03 NOTE — Progress Notes (Signed)
[place] PHYSICAL MEDICINE & REHABILITATION PROGRESS NOTE  Subjective/Complaints: Patient seen laying in bed this morning.  She states she slept well overnight, however she states that she was told by nursing that she was restless overnight.  No reported issues overnight.  ROS: Appears to deny CP, shortness of breath, nausea, vomiting and diarrhea.   Objective: Vital Signs: Blood pressure 124/85, pulse 89, temperature 98.4 F (36.9 C), resp. rate 18, height 5\' 6"  (1.676 m), weight 101.8 kg, SpO2 100 %. No results found. No results for input(s): WBC, HGB, HCT, PLT in the last 72 hours. Recent Labs    07/02/20 0532  NA 136  K 3.7  CL 101  CO2 23  GLUCOSE 139*  BUN 22  CREATININE 0.95  CALCIUM 9.4    Intake/Output Summary (Last 24 hours) at 07/03/2020 0859 Last data filed at 07/02/2020 2052 Gross per 24 hour  Intake 654 ml  Output --  Net 654 ml        Physical Exam: BP 124/85 (BP Location: Right Arm)   Pulse 89   Temp 98.4 F (36.9 C)   Resp 18   Ht 5\' 6"  (1.676 m)   Wt 101.8 kg   SpO2 100%   BMI 36.22 kg/m  Constitutional: No distress . Vital signs reviewed. HENT: Normocephalic.  Atraumatic. Eyes: EOMI. No discharge. Cardiovascular: No JVD.  RRR. Respiratory: Normal effort.  No stridor.  Bilateral clear to auscultation. GI: Non-distended.  BS +. Skin: Warm and dry.  Intact. Psych: Flat.  Slowed. Musc: No edema in extremities.  No tenderness in extremities. Neuro: Alert Right lean, stable Motor: Left upper extremity: 4-/5 proximal distal Left lower extremity: 3 -/5 proximal distal  Assessment/Plan: 1. Functional deficits which require 3+ hours per day of interdisciplinary therapy in a comprehensive inpatient rehab setting.  Physiatrist is providing close team supervision and 24 hour management of active medical problems listed below.  Physiatrist and rehab team continue to assess barriers to discharge/monitor patient progress toward functional and  medical goals   Care Tool:  Bathing    Body parts bathed by patient: Left arm,Chest,Abdomen,Front perineal area,Buttocks,Right upper leg,Left upper leg,Face   Body parts bathed by helper: Left lower leg,Right arm,Right lower leg     Bathing assist Assist Level: Moderate Assistance - Patient 50 - 74%     Upper Body Dressing/Undressing Upper body dressing   What is the patient wearing?: Pull over shirt    Upper body assist Assist Level: Total Assistance - Patient < 25%    Lower Body Dressing/Undressing Lower body dressing      What is the patient wearing?: Pants     Lower body assist Assist for lower body dressing: 2 Helpers     Toileting Toileting    Toileting assist Assist for toileting: Total Assistance - Patient < 25%     Transfers Chair/bed transfer  Transfers assist     Chair/bed transfer assist level: Dependent - mechanical lift (stedy)     Locomotion Ambulation   Ambulation assist      Assist level: Minimal Assistance - Patient > 75% Assistive device: Walker-rolling Max distance: 110'   Walk 10 feet activity   Assist     Assist level: Minimal Assistance - Patient > 75% Assistive device: Walker-rolling   Walk 50 feet activity   Assist    Assist level: Minimal Assistance - Patient > 75% Assistive device: Walker-rolling    Walk 150 feet activity   Assist Walk 150 feet activity did not  occur: Safety/medical concerns  Assist level: Minimal Assistance - Patient > 75% Assistive device: Walker-rolling    Walk 10 feet on uneven surface  activity   Assist Walk 10 feet on uneven surfaces activity did not occur: Safety/medical concerns   Assist level: Minimal Assistance - Patient > 75% Assistive device: Aeronautical engineer Will patient use wheelchair at discharge?: No      Wheelchair assist level: Dependent - Patient 0% Max wheelchair distance: 200    Wheelchair 50 feet with 2 turns  activity    Assist        Assist Level: Dependent - Patient 0%   Wheelchair 150 feet activity     Assist      Assist Level: Dependent - Patient 0%   Medical Problem List and Plan: 1.  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  Continue CIR  Repeat stat CT showing unchanged size of hematoma.  No changes per neurology. 2.  Antithrombotics: -DVT/anticoagulation: SCDs             -antiplatelet therapy: N/A 3. Pain Management: Tylenol as needed  -Voltaren gel to left ankle with improvement 4. Mood: Provide emotional support             -antipsychotic agents: N/A 5. Neuropsych: This patient is not fully capable of making decisions on her own behalf.  Ritalin increased to 5 mg twice daily on 2/12 with improvement 6. Skin/Wound Care: Routine skin checks 7. Fluids/Electrolytes/Nutrition: Routine in and outs 8.  Diabetes mellitus type II with hyperglycemia.  Hemoglobin A1c 6.2.  Continue Glucophage 1000 mg daily.     CBG (last 3)  Recent Labs    07/02/20 1653 07/02/20 2120 07/03/20 0610  GLUCAP 113* 125* 131*   Mildly elevated on 2/15 9.  Hypertension.   HCTZ 25 mg daily, decreased to 12.5 on 2/10, DC'd on 2/15  Monitor with increased mobility 10.  History of kidney stones with stent placement 2017 11. Obesity (BMI 36.48): Provide dietary counseling 12.  Hypokalemia  Potassium 3.7 on 2/14, repeat labs later this week  Supplemented x2 days on 2/11-2/12  Continue to monitor  LOS: 8 days A FACE TO FACE EVALUATION WAS PERFORMED  Cheryl Ortiz Lorie Phenix 07/03/2020, 8:59 AM

## 2020-07-03 NOTE — Progress Notes (Signed)
Speech Language Pathology Weekly Progress and Session Note  Patient Details  Name: Cheryl Ortiz MRN: [id number] Date of Birth: [birth year]  Beginning of progress report period: June 26, 2020 End of progress report period: July 03, 2020  Today's Date: 07/03/2020 SLP Individual Time: 4996-9249 SLP Individual Time Calculation (min): 57 min  Short Term Goals: Week 1: SLP Short Term Goal 1 (Week 1): Pt will self-monitor her vocal intensity, tone, and eye contact (pragmatic communication skills) to increase intelligibility to 90% in conversaton with Min A cues. SLP Short Term Goal 1 - Progress (Week 1): Progressing toward goal SLP Short Term Goal 2 (Week 1): Pt will demonstrate ability to problme solving basic to mildly complex tasks with Min A verbal/visual cues. SLP Short Term Goal 2 - Progress (Week 1): Met SLP Short Term Goal 3 (Week 1): Pt will selectively attend to functional tasks with Min A verbal/visual cues for redirection. SLP Short Term Goal 3 - Progress (Week 1): Progressing toward goal SLP Short Term Goal 4 (Week 1): Pt will demonstrate awareness of functional errors with Min A cues. SLP Short Term Goal 4 - Progress (Week 1): Progressing toward goal    New Short Term Goals: Week 2: SLP Short Term Goal 1 (Week 2): STG=LTG due to remaining length of stay  Weekly Progress Updates: Pt has made relatively slow and minimal functional gains, meeting 1 out of 3 short term goals this reporting period. Pt is currently Min-Mod assist for basic to mildly complex tasks due to cognitive impairments impacting her sustained attention, problem solving, initiation, recall, and emergent awareness. She also exhibits reduced speech intelligibility due to low vocal intensity and pragmatic communication deficits such as impaired topic maintenance, eye contact, and monotone voice. Pt has demonstrated improved basic problem solving, but still exhibits severe impairments in attention and  initiation that have limited further progress. Pt and family education is ongoing. Pt would continue to benefit from skilled ST while inpatient in order to maximize functional independence and reduce burden of care prior to discharge. Anticipate that pt will need 24/7 supervision at discharge in addition to [place] follow up at next level of care.     Intensity: Minumum of 1-2 x/day, 30 to 90 minutes Frequency: 3 to 5 out of 7 days Duration/Length of Stay: 07/12/20 Treatment/Interventions: Cognitive remediation/compensation;Cueing hierarchy;Functional tasks;Internal/external aids;Speech/Language facilitation;Patient/family education;Therapeutic Activities   Daily Session  Skilled Therapeutic Interventions: Pt was seen for skilled ST targeting cognitive goals. SLP facilitated session with a complex medication management task. Pt with excellent verbal recall of medications that were familiar with her, although Min A verbal cues required for recall of medications new to this admission. When using list to organize a BID pill box, she initially required Mod A verbal and visual cueing for initiation, problem solving, and sustained attention to task, but increasing to Max A as session went along. Pt's cues to initiation increased to very basic step-by-step (ex: take the pill from my hand, put it in Monday AM, etc.). Her level of fatigue did appear to increase throughout session. Pt independently recalled her room number when traveling back from speech office via wheelchair. Her daughter was present in room upon our return. SLP reviewed pt's slow progress and difficulty with attention and initiation and how this is impacting pt functionally. Made explicit recommendation for full assistance managing medications, which her daughter acknowledged. Pt left sitting in wheelchair with alarm set and needs within reach. Continue per current plan of care.  Pain Pain Assessment Pain Scale: 0-10 Pain Score: 0-No  pain   Therapy/Group: Individual Therapy  Arbutus Leas 07/03/2020, 7:32 AM

## 2020-07-03 NOTE — Progress Notes (Signed)
Physical Therapy Weekly Progress Note  Patient Details  Name: Cheryl Ortiz MRN: [id number] Date of Birth: 12-03-1950  Beginning of progress report period: June 26, 2020 End of progress report period: July 03, 2020  Today's Date: 07/03/2020 PT Individual Time: 1100-1158 PT Individual Time Calculation (min): 58 min   Patient has met 0 of 4 short term goals. Pt making slow progress during rehab. She has fluctuated in her performance but ultimately is significantly limited by poor initiation, sustained/focused attention, L inattention, L hemibody weakness, and delayed processing. She requires mod/maxA for bed mobility, modA for transfers, and has ambulated ~75-149f with minA and RW.   Patient continues to demonstrate the following deficits muscle weakness, decreased cardiorespiratoy endurance, impaired timing and sequencing, unbalanced muscle activation, motor apraxia, decreased coordination and decreased motor planning, decreased visual perceptual skills and field cut, decreased attention to left, decreased motor planning and ideational apraxia, decreased initiation, decreased attention, decreased awareness, decreased problem solving, decreased safety awareness, decreased memory and delayed processing and decreased sitting balance, decreased standing balance, decreased postural control, hemiplegia and decreased balance strategies and therefore will continue to benefit from skilled PT intervention to increase functional independence with mobility.  Patient not progressing toward long term goals.  See goal revision..  Plan of care revisions: See LTG note for updated goals. Goals downgraded from supervision to CGA/minA due to slower than anticipated progress.  PT Short Term Goals Week 2:  PT Short Term Goal 1 (Week 2): pt will consistently complete bed mobility with modA PT Short Term Goal 2 (Week 2): Pt will complete bed<>chair transfers with modA and LRAD PT Short Term Goal 3 (Week 2):  Pt will ambulate 1558fwith minA and LRAD PT Short Term Goal 4 (Week 2): Pt will initiate stair training  Skilled Therapeutic Interventions/Progress Updates:    Pt received sitting upright in w/c as she just completed OT session. Pt agreeable to therapy, reports L ankle pain but endorses she had just recently received topical cream from nursing for relief. Redirection and distractions provided for pain management. She continues to show very flat affect with hypophonic voice, significant delayed initiation, very poor sustained/focused/divided/alternating attention, and L inattention. W/c transport for time management to main rehab gym. Performed stand<>pivot from w/c to mat table, requiring maxA for initiation and modA for completion, no AD used. Instructed patient on NMR for L inattention with functional reach to the L (with LUE) for horseshoes and reaching across body to place them on R side. She did this with minA guard while standing but required max cues for sequencing and attention. Pt with decent ability to attend to the L side but difficulty sustaining. Instructed on gait training, again requiring maxA for initiation just to stand to the RW and able to ambulate ~7592fith 2 turns (to the L for inattention) and minA. Demo's step-to gait pattern, decreased gait speed, very distracted to objects/sounds on R side. Once returned to mat table, patient refusing to sit down, reporting "if I sit down, that means I have to stand back up." She maintained static standing for at least ~10 minutes with RW support before she initiated sitting position. Attempted to engage her in participating in the TUG but patient unwilling. Placed curtains to limit visual distracters but patient continued to refuse. Pt ultimately agreeable to ambulating back to her room; ambulated ~44f53fth minA and RW with similar gait deficits as above. Returned to her room the remaining distance in her w/c and she remained seated with safety  belt  alarm on and her needs within reach.  Therapy Documentation Precautions:  Precautions Precautions: Fall Restrictions Weight Bearing Restrictions: No  Therapy/Group: Individual Therapy  Iverson Sees P Jarrin Staley PT 07/03/2020, 7:34 AM

## 2020-07-03 NOTE — Progress Notes (Addendum)
A & O x4. L ankle was throbbing, aching and radiating pain, scale of 0-10 an 8.  Gave 0800 meds, applied Voltaren Gel to L ankle and R knee. Reassessed pain at 0845 pain scale 0-10  5. She used stedy to use the restroom. Brief was soiled w/ clear/yellow urine. Encouraged and monitored breakfast. Ate 75%.   Arlis Porta MSXJD

## 2020-07-04 LAB — GLUCOSE, CAPILLARY
Glucose-Capillary: 123 mg/dL — ABNORMAL HIGH (ref 70–99)
Glucose-Capillary: 97 mg/dL (ref 70–99)
Glucose-Capillary: 111 mg/dL — ABNORMAL HIGH (ref 70–99)
Glucose-Capillary: 126 mg/dL — ABNORMAL HIGH (ref 70–99)

## 2020-07-04 MED ORDER — LIDOCAINE 5 % EX PTCH
1.0000 | MEDICATED_PATCH | CUTANEOUS | Status: DC
  Administered 2020-07-04 – 2020-07-18 (×15): 1 via TRANSDERMAL
  Filled 2020-07-04 (×15): qty 1

## 2020-07-04 NOTE — Progress Notes (Signed)
[facility] PHYSICAL MEDICINE & REHABILITATION PROGRESS NOTE  Subjective/Complaints: Patient seen sitting up in bed this morning.  She states she slept well overnight.  She is back to work with therapies.  She initially denies complaints, later complains of left dorsal foot pain-discussed functional relationship with therapies.  ROS: + Left dorsal foot pain.  Denies CP, shortness of breath, nausea, vomiting and diarrhea.   Objective: Vital Signs: Blood pressure 107/68, pulse 75, temperature 98.7 F (37.1 C), resp. rate 14, height 5\' 6"  (1.676 m), weight 101.8 kg, SpO2 99 %. No results found. No results for input(s): WBC, HGB, HCT, PLT in the last 72 hours. Recent Labs    07/02/20 0532  NA 136  K 3.7  CL 101  CO2 23  GLUCOSE 139*  BUN 22  CREATININE 0.95  CALCIUM 9.4    Intake/Output Summary (Last 24 hours) at 07/04/2020 1048 Last data filed at 07/03/2020 1815 Gross per 24 hour  Intake 200 ml  Output --  Net 200 ml        Physical Exam: BP 107/68 (BP Location: Right Arm)   Pulse 75   Temp 98.7 F (37.1 C)   Resp 14   Ht 5\' 6"  (1.676 m)   Wt 101.8 kg   SpO2 99%   BMI 36.22 kg/m  Constitutional: No distress . Vital signs reviewed. HENT: Normocephalic.  Atraumatic. Eyes: EOMI. No discharge. Cardiovascular: No JVD.  RRR. Respiratory: Normal effort.  No stridor.  Bilateral clear to auscultation. GI: Non-distended.  BS +. Skin: Warm and dry.  Intact. Psych: Normal mood.  Normal behavior. Musc: No edema in extremities.  No tenderness in extremities. Psych: Flat.  Slowed. Musc: No edema in extremities.  No tenderness in extremities. Neuro: Alert Right lean, unchanged Motor: Left upper extremity: 4-/5 proximal distal Left lower extremity: 3-/5 proximal distal, unchanged  Assessment/Plan: 1. Functional deficits which require 3+ hours per day of interdisciplinary therapy in a comprehensive inpatient rehab setting.  Physiatrist is providing close team supervision  and 24 hour management of active medical problems listed below.  Physiatrist and rehab team continue to assess barriers to discharge/monitor patient progress toward functional and medical goals   Care Tool:  Bathing    Body parts bathed by patient: Left arm,Chest,Abdomen,Front perineal area,Buttocks,Right upper leg,Left upper leg,Face,Right lower leg,Right arm   Body parts bathed by helper: Left lower leg     Bathing assist Assist Level: Minimal Assistance - Patient > 75%     Upper Body Dressing/Undressing Upper body dressing   What is the patient wearing?: Pull over shirt,Bra    Upper body assist Assist Level: Moderate Assistance - Patient 50 - 74% (max bra, CGA and max verbal cues shirt)    Lower Body Dressing/Undressing Lower body dressing      What is the patient wearing?: Pants,Incontinence brief     Lower body assist Assist for lower body dressing: Maximal Assistance - Patient 25 - 49%     Toileting Toileting    Toileting assist Assist for toileting: Moderate Assistance - Patient 50 - 74%     Transfers Chair/bed transfer  Transfers assist     Chair/bed transfer assist level: Moderate Assistance - Patient 50 - 74%     Locomotion Ambulation   Ambulation assist      Assist level: Minimal Assistance - Patient > 75% Assistive device: Walker-rolling Max distance: 110'   Walk 10 feet activity   Assist     Assist level: Minimal Assistance - Patient >  75% Assistive device: Walker-rolling   Walk 50 feet activity   Assist    Assist level: Minimal Assistance - Patient > 75% Assistive device: Walker-rolling    Walk 150 feet activity   Assist Walk 150 feet activity did not occur: Safety/medical concerns  Assist level: Minimal Assistance - Patient > 75% Assistive device: Walker-rolling    Walk 10 feet on uneven surface  activity   Assist Walk 10 feet on uneven surfaces activity did not occur: Safety/medical concerns   Assist level:  Minimal Assistance - Patient > 75% Assistive device: Aeronautical engineer Will patient use wheelchair at discharge?: No      Wheelchair assist level: Dependent - Patient 0% Max wheelchair distance: 200    Wheelchair 50 feet with 2 turns activity    Assist        Assist Level: Dependent - Patient 0%   Wheelchair 150 feet activity     Assist      Assist Level: Dependent - Patient 0%   Medical Problem List and Plan: 1.  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  Continue CIR  Repeat stat CT showing unchanged size of hematoma.  No changes per neurology.  Team conference today to discuss current and goals and coordination of care, home and environmental barriers, and discharge planning with nursing, case manager, and therapies. Please see conference note from today as well.  2.  Antithrombotics: -DVT/anticoagulation: SCDs             -antiplatelet therapy: N/A 3. Pain Management: Tylenol as needed  -Voltaren gel to left ankle   Lidoderm patch ordered for left foot 4. Mood: Provide emotional support             -antipsychotic agents: N/A 5. Neuropsych: This patient is not fully capable of making decisions on her own behalf.  Ritalin increased to 5 mg twice daily on 2/12 with improvement 6. Skin/Wound Care: Routine skin checks 7. Fluids/Electrolytes/Nutrition: Routine in and outs 8.  Diabetes mellitus type II with hyperglycemia.  Hemoglobin A1c 6.2.  Continue Glucophage 1000 mg daily.     CBG (last 3)  Recent Labs    07/03/20 1635 07/03/20 2114 07/04/20 0654  GLUCAP 136* 118* 123*   Mildly elevated on 2/15 9.  Hypertension.   HCTZ 25 mg daily, decreased to 12.5 on 2/10, DC'd on 2/15  Controlled on 2/16  Monitor with increased mobility 10.  History of kidney stones with stent placement 2017 11.  Obesity (BMI 36.48): Provide dietary counseling 12.  Hypokalemia  Potassium 3.7 on 2/14, repeat labs later this week  Supplemented x2 days on 2/11-2/12  Continue to monitor  LOS: 9 days A FACE TO FACE EVALUATION WAS PERFORMED  Edgard Debord Lorie Phenix 07/04/2020, 10:48 AM

## 2020-07-04 NOTE — Progress Notes (Signed)
Physical Therapy Session Note  Patient Details  Name: Cheryl Ortiz MRN: [id number] Date of Birth: 1951-03-13  Today's Date: 07/04/2020 PT Individual Time: 1030-1115 PT Individual Time Calculation (min): 45 min   Short Term Goals: Week 2:  PT Short Term Goal 1 (Week 2): pt will consistently complete bed mobility with modA PT Short Term Goal 2 (Week 2): Pt will complete bed<>chair transfers with modA and LRAD PT Short Term Goal 3 (Week 2): Pt will ambulate 166ft with minA and LRAD PT Short Term Goal 4 (Week 2): Pt will initiate stair training  Skilled Therapeutic Interventions/Progress Updates:     Patient in w/c in the room upon PT arrival. Patient alert and agreeable to PT session. Patient reported 5/10 L ankle pain during session, RN made aware. PT provided repositioning, rest breaks, and distraction as pain interventions throughout session.   Patient demonstrates deficits in motor planning, L attention, and was both internally and externally distracted throughout session. Required increased time for initiation and sequencing with all mobility.   Therapeutic Activity: Bed Mobility: Patient performed sit to supine with mod A due to decreased motor planning. Provided verbal cues and facilitation for initiation. Transfers: Patient performed sit to/from stand x3 with min A-CGA from low mat table, toilet transfer from [facility] over toilet x1 and stand pivot w/c>mat table with min A using RW. Provided verbal cues and increased time for initiation, sequencing, hand placement, and safety with use of RW.  Gait Training:  Patient ambulated >60 ft feet using RW with CGA-min A, increased assist with increased fatigue for management of RW on the L due to L inattention. Ambulated with step-through gait pattern, decreased step length and height on L, decreased gait speed, mild L posterior pelvic rotation, and forward trunk flexion. Provided verbal cues for erect posture, increased hamstring activation in  pre-swing on L, increased gait speed, and attention to task throughout.  Patient in bed due to decreased motor planning to stand pivot to w/c at end of session with breaks locked, bed alarm set, and all needs within reach.    Therapy Documentation Precautions:  Precautions Precautions: Fall Restrictions Weight Bearing Restrictions: Yes   Therapy/Group: Individual Therapy  Emilina Smarr L Kimberlee Shoun PT, DPT  07/04/2020, 12:41 PM

## 2020-07-04 NOTE — Patient Care Conference (Signed)
Inpatient RehabilitationTeam Conference and Plan of Care Update Date: 07/04/2020   Time: 11:30 AM    Patient Name: Cheryl Ortiz      Medical Record Number: [id number]  Date of Birth: 06/30/1950 Sex: Female         Room/Bed: 4W14C/4W14C-02 Payor Info: Payor: MEDICARE / Plan: MEDICARE PART A AND B / Product Type: *No Product type* /    Admit Date/Time:  06/25/2020  3:55 PM  Primary Diagnosis:  ICH (intracerebral hemorrhage) [facility])  Hospital Problems: Principal Problem:   ICH (intracerebral hemorrhage) ([place]) Active Problems:   Hypokalemia   Essential hypertension   Controlled type 2 diabetes mellitus with hyperglycemia, without long-term current use of insulin ([place])   History of hypertension   Hypotension due to drugs   Labile blood glucose   Attention and concentration deficit    Expected Discharge Date: Expected Discharge Date: 07/19/20  Team Members Present: Physician leading conference: Dr. Delice Lesch Care Coodinator Present: Dorien Chihuahua, RN, BSN, CRRN;Becky Dupree, LCSW Nurse Present: Suella Grove, RN PT Present: Ginnie Smart, PT OT Present: Meriel Pica, OT SLP Present: Nadara Mode, SLP PPS Coordinator present : Gunnar Fusi, SLP     Current Status/Progress Goal Weekly Team Focus  Bowel/Bladder   Pt will be continent of B/B LBM 02/16  pt will be continent of B/B with normal bowel pattern  toilet q2-3H and prn   Swallow/Nutrition/ Hydration   full supervision due to cognition, no dysphagia         ADL's   min-mod A overall, left inattention present, poor sustained attention/problem solving, slow processing, impaired motor planning with apraxia.  supervision; may need to downgrade due to slow progress  ADL training, functional mobility, balance, LUE NMR and left attending, cognition   Mobility   mod/maxA bed mobility, min/modA sit<>stand depending on initation, minA stand<>pivot transfers. Gait ~82ft wtih minA and RW.  Downgraded goals to CGA/minA  L  attention, functional transfers and bed mobility, gait training, initiate stairs   Communication   pragmatic communication deficits, Mod A  Supervision  eye contact, tone (monotone), vocal intensity, self monitoring   Safety/Cognition/ Behavioral Observations  Min-Mod, very little progress this week due to decline in attention and initiation, made full supervision for meals  Supervision  initiation, sustained and maybe selective attention, emergent awareness, problem solving   Pain   denies  free of pain  assess pain qshift and prn medicate as ordered and reassess for relief   Skin   MASD to buttocks barrier cream  improvement of MASD no further breakdown pt will be free of infection  assess skin qshift and prn     Discharge Planning:  Daughter Ulyses Amor FMLA to be able to provide 24/7 supervision. Will be home with brother who is blind.   Team Discussion: Patient with pain in left foot, easily distracted, requires cues for activities to stay on task and maintain speed with actions, poor attention overall, left inattention, problem solving issues. Pain in left foot despite use of shoe with insert.  Patient on target to meet rehab goals: No, currently min - mod assist for transfers, min assist for gait  *See Care Plan and progress notes for long and short-term goals.   Revisions to Treatment Plan:  Downgraded goals due to poor attention, easily distracted, etc. 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  Barriers to Discharge: Behavior;Medical stability;Weight;Incontinence    Possible Resolutions to [employer] Focus: Therapies, optimize DM/BP meds, follow labs - K+, optimize pain meds. optimize bowel meds   Continued Need for Acute Rehabilitation Level of Care: The patient requires daily medical management by a physician with specialized training in physical medicine and rehabilitation for the following reasons: Direction of a multidisciplinary physical rehabilitation program to maximize functional independence : Yes Medical management of patient stability for increased activity during participation in an intensive rehabilitation regime.: Yes Analysis of laboratory values and/or radiology reports with any subsequent need for medication adjustment and/or medical intervention. : Yes   I attest that I was present, lead the team conference, and concur with the assessment and plan of the team.   Dorien Chihuahua B 07/04/2020, 1:37 PM

## 2020-07-04 NOTE — Progress Notes (Signed)
Speech Language Pathology Daily Session Note  Patient Details  Name: Cheryl Ortiz MRN: [id number] Date of Birth: 1951/04/08  Today's Date: 07/04/2020 SLP Individual Time: 1400-1500 SLP Individual Time Calculation (min): 60 min  Short Term Goals: Week 2: SLP Short Term Goal 1 (Week 2): STG=LTG due to remaining length of stay  Skilled Therapeutic Interventions:   Patient seen for skilled ST session focusing on cognitive-linguistic goals. She recalled previous therapist telling her about increasing her vocal volume. She was able to slightly increase vocal intensity when reading sentence level for first half of 10 word sentence but then voice started to fade. Patient unable to achieve baseline vocal intensity (daughter had played SLP a clip of patient in a bible study speaking in a group) and continues to be monotone without any facial expressions as well. Daughter did say patient would joke around, etc prior to stroke. Patient sequenced 3-photo pictures with minA for attention. She required overall modA fading to min-modA for attention during discussion and cognitive and language tasks. She continues to benefit from skilled SLP intervention to maximize cognitive-linguistic function prior to discharge.  Pain Pain Assessment Pain Scale: 0-10 Pain Score: 0-No pain  Therapy/Group: Individual Therapy  Sonia Baller, MA, CCC-SLP Speech Therapy

## 2020-07-04 NOTE — Progress Notes (Signed)
Occupational Therapy Weekly Progress Note  Patient Details  Name: Cheryl Ortiz MRN: [id number] Date of Birth: January 13, 1951  Beginning of progress report period: June 27, 2019 End of progress report period: July 05, 2019  Today's Date: 07/04/2020 OT Individual Time: 8115-7262 OT Individual Time Calculation (min): 65 min    Patient has met 1 of 5 short term goals.  The day after her initial evaluation, pt had a decline in function demonstrating lethargy, postural impairments, severely impaired initiation.  She has since made some progress and is now engaging more.  She was not able to meet all of her STGS, but as of today she is progressing towards them. When she receives significant cues, she is able to complete mobility with min-mod A and self care with mod A.  Patient continues to demonstrate the following deficits: decreased cardiorespiratoy endurance, unbalanced muscle activation and motor apraxia, left side neglect, decreased initiation, decreased attention, decreased awareness, decreased problem solving, decreased memory and delayed processing and decreased standing balance, decreased postural control, hemiplegia and decreased balance strategies and therefore will continue to benefit from skilled OT intervention to enhance overall performance with BADL.  Patient progressing toward long term goals..  Continue plan of care.  OT Short Term Goals Week 1:  OT Short Term Goal 1 (Week 1): Pt will complete toilet transfer with min A during a stand pivot. OT Short Term Goal 1 - Progress (Week 1): Progressing toward goal OT Short Term Goal 2 (Week 1): Pt will don a shirt with verbal cues only. OT Short Term Goal 2 - Progress (Week 1): Progressing toward goal OT Short Term Goal 3 (Week 1): Pt will don pants with mod A or less. OT Short Term Goal 3 - Progress (Week 1): Progressing toward goal OT Short Term Goal 4 (Week 1): Pt will be able to actively use L arm to reach across and wash R  arm. OT Short Term Goal 4 - Progress (Week 1): Met OT Short Term Goal 5 (Week 1): Pt will demonstrate improved L side awareness when stepping into the shower only needing 1 verbal cue to fully step to her L.. OT Short Term Goal 5 - Progress (Week 1): Progressing toward goal Week 2:  OT Short Term Goal 1 (Week 2): Pt will demonstrate improved L side awareness when stepping into the shower only needing 1 verbal cue to fully step to her L.. OT Short Term Goal 2 (Week 2): Pt will don pants with mod A or less. OT Short Term Goal 3 (Week 2): Pt will don a shirt with verbal cues only. OT Short Term Goal 4 (Week 2): Pt will complete toilet transfer with min A during a stand pivot.  Skilled Therapeutic Interventions/Progress Updates:    Pt received in bed, awake and alert.  She stated she needed to use the bathroom.  Mod cues and A to sit to EOB, but then she was able to stand with min A to RW and stand pivot to wc.   Pt taken to bathroom and used grab bar to pivot to toilet with min A but MOD CUES.  Pt completed toileting, then transferred back to w/c to step into shower.  She used her L hand well to reach over and wash R arm.  Transferred back to wc to dress.    With ALL activities pt needs MOD - MAX verbal cues and occasional tactile cues to initiate, attend to L side of body, but she is needing less physical assist.  Pt was able to stand with only CGA to pull pants over hips with min A.    Will continue to focus on pt's initiation, attention, L side awareness. Pt resting in wc with alll needs met, belt alarm on.   Therapy Documentation Precautions:  Precautions Precautions: Fall Restrictions Weight Bearing Restrictions: Yes       Pain:  No c/o pain   ADL: ADL Eating: Set up Grooming: Setup Upper Body Bathing: Moderate cueing Where Assessed-Upper Body Bathing: Shower Lower Body Bathing: Moderate cueing,Minimal assistance Where Assessed-Lower Body Bathing: Shower Upper Body Dressing:  Moderate cueing,Minimal assistance Where Assessed-Upper Body Dressing: Wheelchair Lower Body Dressing: Maximal assistance Where Assessed-Lower Body Dressing: Wheelchair Toileting: Moderate assistance,Moderate cueing Where Assessed-Toileting: Glass blower/designer: Psychiatric nurse Method: Arts development officer: Energy manager: Environmental education officer Method: Radiographer, therapeutic: Transfer tub bench,Grab bars   Therapy/Group: Individual Therapy  [place] 07/04/2020, 12:43 PM

## 2020-07-04 NOTE — Progress Notes (Signed)
Physical Therapy Session Note  Patient Details  Name: Cheryl Ortiz MRN: [id number] Date of Birth: 28-Apr-1951  Today's Date: 07/04/2020 PT Individual Time: 0254-2706 PT Individual Time Calculation (min): 58 min   Short Term Goals: Week 2:  PT Short Term Goal 1 (Week 2): pt will consistently complete bed mobility with modA PT Short Term Goal 2 (Week 2): Pt will complete bed<>chair transfers with modA and LRAD PT Short Term Goal 3 (Week 2): Pt will ambulate 145ft with minA and LRAD PT Short Term Goal 4 (Week 2): Pt will initiate stair training  Skilled Therapeutic Interventions/Progress Updates:    Handoff of care from RN who was ambulating patient to the bathroom with RW. Pt requiring CGA for gait within her room with frequent verbal cueing for RW management and navigating turns to the L. Ambulated to 3-1 [facility] that was placed over toilet and instructed patient to lower pants in standing to prepare to sit on toilet. Pt with significant saturated brief of both loose bowel>bladder that had gotten all over her pants and socks. She required totalA for removing pants and dirty brief. Encouraged her to participate in self care for cleaning up but due to poor initiation and problem solving, she required max cues and maxA as well.  She was able to stand from 3-1 [facility] with CGA and use of grab bars while therapist assisted with cleaning, as she required totalA for posterior pericare. Required maxA for donning brief/pants and encouraged her to assist with pulling over hips in standing which she did with minA for balance. Cues for using her L hand as well as she tends to forget to use it. Ambulated with CGA and RW outside of bathroom to the sink and she washed hands while standing with CGA for balance. Ambulated back to her w/c with CGA and RW, ~87ft, cues for safety approach and navigating tight spaces within her room. She ended session seated in w/c with safety belt alarm on and needs within reach.  Therapy  Documentation Precautions:  Precautions Precautions: Fall Restrictions Weight Bearing Restrictions: Yes  Therapy/Group: Individual Therapy  Ova Gillentine P Cam Dauphin PT 07/04/2020, 7:46 AM

## 2020-07-04 NOTE — Progress Notes (Signed)
Patient ID: Cheryl Ortiz, female   DOB: [birth year], 70 y.o.   MRN: [id number]  Met with pt and contacted daughter via telephone to discuss team conference progress, slower than anticipated and will need 24/7 care at discharge. Have extended discharge to 3/3 to reach her goals. Have informed daughter of this due to taking a FMLA and will need to let supervisor know of her plans. Daughter has been here and seen how distractible and the need for cues Mom needs. Pt's ankle pain is better and she did better today. Will work on discharge needs.

## 2020-07-05 DIAGNOSIS — M79672 Pain in left foot: Secondary | ICD-10-CM

## 2020-07-05 LAB — GLUCOSE, CAPILLARY
Glucose-Capillary: 121 mg/dL — ABNORMAL HIGH (ref 70–99)
Glucose-Capillary: 114 mg/dL — ABNORMAL HIGH (ref 70–99)
Glucose-Capillary: 108 mg/dL — ABNORMAL HIGH (ref 70–99)
Glucose-Capillary: 107 mg/dL — ABNORMAL HIGH (ref 70–99)

## 2020-07-05 NOTE — Progress Notes (Signed)
Physical Therapy Session Note  Patient Details  Name: Cheryl Ortiz MRN: [id number] Date of Birth: 1951/01/27  Today's Date: 07/05/2020 PT Individual Time: 1030-1058 + 1350-1500 PT Individual Time Calculation (min): 28 min  + 70 min  Short Term Goals: Week 2:  PT Short Term Goal 1 (Week 2): pt will consistently complete bed mobility with modA PT Short Term Goal 2 (Week 2): Pt will complete bed<>chair transfers with modA and LRAD PT Short Term Goal 3 (Week 2): Pt will ambulate 163ft with minA and LRAD PT Short Term Goal 4 (Week 2): Pt will initiate stair training  Skilled Therapeutic Interventions/Progress Updates:   1st session:   Pt received sitting upright in w/c, awake and agreeable to therapy. Appears to show improved processing and improved attention to L side. W/c transport for time management to ortho gym and placed in front of BITS. Sit<>Stand with CGA to RW (extra time needed for initiation) and performed visual scanning with targeted (targets placed with bias towards L side) reaching to BITS with LUE only. Reaction time of 3.5 seconds. Gait training from ortho gym back to her room, >215ft (!!), with CGA and RW. Cues for forward gaze, safety awareness, stepping pattern, RW management, and improving L attention (she keeps her head in R cervical rotation). Pt requesting need to void once she got back to her room. She required modA for lowering pants/briefs and minA for controlled lowering to toilet. Pt instructed on pull-cord when complete and for staff assist and she voiced understanding. All made NT aware at end of session of patient's status.   2nd session: Pt received sitting upright in manual w/c, agreeable to therapy. Did not ask pain as sometimes she will perseverate on L ankle pain when asked. W/c transport for time management to ortho gym. Performed gait training with obstacle course with weaving in/out of cones spaced ~58ft apart with a total of 2x58ft. She was able to  complete this with CGA and RW and required min cues for attending to L and for general sequencing. She then performed unsupported standing balance with 2lb dowel rod performing shldr press and chest press, 2x20 each. Instructed on standing toe taps on 4inch block with RW support and CGA for steadying, increased difficulty with L foot advancement and she would frequently catch it on the platform while stepping up, able to correct with cues but difficult to sustain. Stand step pivot with minA and RW to Nustep and completed x8 minutes at workload 4 with both BUE and BLE, emphasizing L attention and coordination. She continues to show significantly delayed initiation, poor sustained/focused attention, and L inattention impacting functional mobility and requires extra time for completion of functional tasks because of this.  Therapy Documentation Precautions:  Precautions Precautions: Fall Restrictions Weight Bearing Restrictions: No   Therapy/Group: Individual Therapy  Sahara Fujimoto P Quintavia Rogstad PT 07/05/2020, 7:40 AM

## 2020-07-05 NOTE — Progress Notes (Signed)
Speech Language Pathology Daily Session Note  Patient Details  Name: Cheryl Ortiz MRN: [id number] Date of Birth: 02-19-1951  Today's Date: 07/05/2020 SLP Individual Time: 0900-1000 SLP Individual Time Calculation (min): 60 min  Short Term Goals: Week 2: SLP Short Term Goal 1 (Week 2): STG=LTG due to remaining length of stay  Skilled Therapeutic Interventions:   Patient seen for skilled ST session to address cognitive-linguistic goals. Upon entering room, patient in bed and holding phone up to ear but did not appear to be talking to anyone. She required modA cues to redirect her attention away from phone. SLP then cued patient for task of getting out of bed, donning pants and shoes, and completing bed to [facility] transfer. Patient required mod-maxA cues for attention, initiation and maintenance of attention to this series of tasks as she would become externally distracted (reaching for things nearby that were not needed for task, etc). Patient was able to complete transfer into [facility] and SLP brought her to therapy room. She participated in structured task/word game, requiring modA cues to redirect attention, but with good task performance when attending. Patient continues to benefit from skilled SLP intervention to maximize cognitiv-linguistic function prior to discharge.   Pain Pain Assessment Pain Scale: 0-10 Pain Score: 0-No pain  Therapy/Group: Individual Therapy  Sonia Baller, MA, CCC-SLP Speech Therapy

## 2020-07-05 NOTE — Progress Notes (Signed)
Patient ID: Cheryl Ortiz, female   DOB: Apr 04, 1951, 70 y.o.   MRN: [id number] Follow up with the patient regarding previous education and questions. Continue to follow along to discharge to address educational needs. Margarito Liner

## 2020-07-05 NOTE — Progress Notes (Signed)
Occupational Therapy Session Note  Patient Details  Name: Cheryl Ortiz MRN: [id number] Date of Birth: 08/07/1950  Today's Date: 07/05/2020 OT Individual Time: 1105-1205 OT Individual Time Calculation (min): 60 min    Short Term Goals: Week 2:  OT Short Term Goal 1 (Week 2): Pt will demonstrate improved L side awareness when stepping into the shower only needing 1 verbal cue to fully step to her L.. OT Short Term Goal 2 (Week 2): Pt will don pants with mod A or less. OT Short Term Goal 3 (Week 2): Pt will don a shirt with verbal cues only. OT Short Term Goal 4 (Week 2): Pt will complete toilet transfer with min A during a stand pivot.    Skilled Therapeutic Interventions/Progress Updates:    Pt sitting on 3 in 1 commode, just having finished urinating requesting to complete pericare.  Pt completed pericare with supervision in seated position.  Clothing mgt required min assist to pull over hips left posterior side.  Pt completed toilet transfer with CGA using RW.  Pt ambulated to sink to wash hands, then ambulated to bedside dresser to retrieve ADL items for oral hygiene.  OT carried items over to sink due to pt requiring BUE for support at RW to ambulate.  All previously mentioned functional mobility completed with CGA and min VCs to attend to left side and stay on task.  Pt brushed teeth in standing with close supervision.  Pt then transported to ADL suite and participated in kitchen item scavenger hunt and retrieval to facilitate sustained and alternating attention skills, problem solving, motor planning and processing skills, as well as dynamic standing balance and safe RW management skills.  Pt able to retrieve 6/6 items with close supervision and min/mod VCs for safe RW mgt and left attention intermittently.  Pt returned back to room via w/c transport, call bell in reach, seat belt alarm on.  Pt exhibited significant improvements in sustained attention, motor processing/planning, and  initiation today.  Left inattention prevalent but still somewhat improved today as well.     Therapy Documentation Precautions:  Precautions Precautions: Fall Restrictions Weight Bearing Restrictions: No   Therapy/Group: Individual Therapy  Ezekiel Slocumb 07/05/2020, 1:59 PM

## 2020-07-05 NOTE — Progress Notes (Signed)
[facility] PHYSICAL MEDICINE & REHABILITATION PROGRESS NOTE  Subjective/Complaints: Patient seen sitting up in bed this morning.  She states she slept well overnight.  She notes that she has improvement in left foot pain with patch, but states she is still in pain.  ROS: + Left dorsal foot pain, some improvement.  Denies CP, shortness of breath, nausea, vomiting and diarrhea.   Objective: Vital Signs: Blood pressure 115/72, pulse (!) 52, temperature 98.4 F (36.9 C), temperature source Oral, resp. rate 18, height 5\' 6"  (1.676 m), weight 101.8 kg, SpO2 96 %. No results found. No results for input(s): WBC, HGB, HCT, PLT in the last 72 hours. No results for input(s): NA, K, CL, CO2, GLUCOSE, BUN, CREATININE, CALCIUM in the last 72 hours.  Intake/Output Summary (Last 24 hours) at 07/05/2020 1314 Last data filed at 07/05/2020 1236 Gross per 24 hour  Intake 560 ml  Output --  Net 560 ml        Physical Exam: BP 115/72   Pulse (!) 52   Temp 98.4 F (36.9 C) (Oral)   Resp 18   Ht 5\' 6"  (1.676 m)   Wt 101.8 kg   SpO2 96%   BMI 36.22 kg/m  Constitutional: No distress . Vital signs reviewed. HENT: Normocephalic.  Atraumatic. Eyes: EOMI. No discharge. Cardiovascular: No JVD.  RRR. Respiratory: Normal effort.  No stridor.  Bilateral clear to auscultation. GI: Non-distended.  BS +. Skin: Warm and dry.  Intact. Psych: Flat.  Slowed.   Musc: No edema in extremities.  No tenderness in extremities. Neuro: Alert Right lean, stable Motor: Left upper extremity: 4-/5 proximal distal Left lower extremity: 3-/5 proximal distal, stable  Assessment/Plan: 1. Functional deficits which require 3+ hours per day of interdisciplinary therapy in a comprehensive inpatient rehab setting.  Physiatrist is providing close team supervision and 24 hour management of active medical problems listed below.  Physiatrist and rehab team continue to assess barriers to discharge/monitor patient progress  toward functional and medical goals   Care Tool:  Bathing    Body parts bathed by patient: Left arm,Chest,Abdomen,Front perineal area,Buttocks,Right upper leg,Left upper leg,Face,Right lower leg,Right arm   Body parts bathed by helper: Left lower leg     Bathing assist Assist Level: Minimal Assistance - Patient > 75%     Upper Body Dressing/Undressing Upper body dressing   What is the patient wearing?: Pull over shirt,Bra    Upper body assist Assist Level: Moderate Assistance - Patient 50 - 74% (max bra, CGA and max verbal cues shirt)    Lower Body Dressing/Undressing Lower body dressing      What is the patient wearing?: Pants,Incontinence brief     Lower body assist Assist for lower body dressing: Maximal Assistance - Patient 25 - 49%     Toileting Toileting    Toileting assist Assist for toileting: Moderate Assistance - Patient 50 - 74%     Transfers Chair/bed transfer  Transfers assist     Chair/bed transfer assist level: Minimal Assistance - Patient > 75%     Locomotion Ambulation   Ambulation assist      Assist level: Minimal Assistance - Patient > 75% Assistive device: Walker-rolling Max distance: >60 ft   Walk 10 feet activity   Assist     Assist level: Minimal Assistance - Patient > 75% Assistive device: Walker-rolling   Walk 50 feet activity   Assist    Assist level: Minimal Assistance - Patient > 75% Assistive device: Walker-rolling  Walk 150 feet activity   Assist Walk 150 feet activity did not occur: Safety/medical concerns  Assist level: Minimal Assistance - Patient > 75% Assistive device: Walker-rolling    Walk 10 feet on uneven surface  activity   Assist Walk 10 feet on uneven surfaces activity did not occur: Safety/medical concerns   Assist level: Minimal Assistance - Patient > 75% Assistive device: Aeronautical engineer Will patient use wheelchair at discharge?: No       Wheelchair assist level: Dependent - Patient 0% Max wheelchair distance: 200    Wheelchair 50 feet with 2 turns activity    Assist        Assist Level: Dependent - Patient 0%   Wheelchair 150 feet activity     Assist      Assist Level: Dependent - Patient 0%   Medical Problem List and Plan: 1.  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  Continue CIR  Repeat stat CT showing unchanged size of hematoma.  No changes per neurology. 2.  Antithrombotics: -DVT/anticoagulation: SCDs             -antiplatelet therapy: N/A 3. Pain Management: Tylenol as needed  Chronic left foot pain:   Does not appear to limit therapies   Voltaren gel to left ankle    Lidoderm patch ordered for left foot, with some improvement 4. Mood: Provide emotional support             -antipsychotic agents: N/A 5. Neuropsych: This patient is not fully capable of making decisions on her own behalf.  Ritalin increased to 5 mg twice daily on 2/12 with improvement 6. Skin/Wound Care: Routine skin checks 7. Fluids/Electrolytes/Nutrition: Routine in and outs 8.  Diabetes mellitus type II with hyperglycemia.  Hemoglobin A1c 6.2.  Continue Glucophage 1000 mg daily.     CBG (last 3)  Recent Labs    07/04/20 2109 07/05/20 0556 07/05/20 1140  GLUCAP 126* 121* 114*   Mildly elevated on 2/17 9.  Hypertension.   HCTZ 25 mg daily, decreased to 12.5 on 2/10, DC'd on 2/15  Controlled on 2/17  Monitor with increased mobility 10.  History of kidney stones with stent placement 2017 11. Obesity (BMI 36.48): Provide dietary counseling 12.  Hypokalemia  Potassium 3.7 on 2/14, labs ordered for tomorrow  Supplemented x2 days on 2/11-2/12  Continue to monitor  LOS: 10 days A FACE TO FACE EVALUATION WAS PERFORMED  Zakee Deerman Lorie Phenix 07/05/2020, 1:14 PM

## 2020-07-06 DIAGNOSIS — R0989 Other specified symptoms and signs involving the circulatory and respiratory systems: Secondary | ICD-10-CM

## 2020-07-06 LAB — GLUCOSE, CAPILLARY
Glucose-Capillary: 120 mg/dL — ABNORMAL HIGH (ref 70–99)
Glucose-Capillary: 90 mg/dL (ref 70–99)
Glucose-Capillary: 102 mg/dL — ABNORMAL HIGH (ref 70–99)
Glucose-Capillary: 110 mg/dL — ABNORMAL HIGH (ref 70–99)

## 2020-07-06 LAB — BASIC METABOLIC PANEL
Anion gap: 12 (ref 5–15)
BUN: 25 mg/dL — ABNORMAL HIGH (ref 8–23)
CO2: 21 mmol/L — ABNORMAL LOW (ref 22–32)
Calcium: 9.4 mg/dL (ref 8.9–10.3)
Chloride: 102 mmol/L (ref 98–111)
Creatinine, Ser: 0.99 mg/dL (ref 0.44–1.00)
GFR, Estimated: >60 mL/min (ref >60)
Glucose, Bld: 120 mg/dL — ABNORMAL HIGH (ref 70–99)
Potassium: 3.7 mmol/L (ref 3.5–5.1)
Sodium: 135 mmol/L (ref 135–145)

## 2020-07-06 NOTE — Progress Notes (Signed)
[facility] PHYSICAL MEDICINE & REHABILITATION PROGRESS NOTE  Subjective/Complaints: Patient seen laying in bed this morning.  He states he slept well overnight.  She is about to work with therapies.  ROS: Denies CP, shortness of breath, nausea, vomiting and diarrhea.   Objective: Vital Signs: Blood pressure 110/64, pulse 87, temperature 98.4 F (36.9 C), resp. rate 17, height 5\' 6"  (1.676 m), weight 101.8 kg, SpO2 92 %. No results found. No results for input(s): WBC, HGB, HCT, PLT in the last 72 hours. Recent Labs    07/06/20 0531  NA 135  K 3.7  CL 102  CO2 21*  GLUCOSE 120*  BUN 25*  CREATININE 0.99  CALCIUM 9.4    Intake/Output Summary (Last 24 hours) at 07/06/2020 0959 Last data filed at 07/06/2020 0900 Gross per 24 hour  Intake 520 ml  Output --  Net 520 ml        Physical Exam: BP 110/64   Pulse 87   Temp 98.4 F (36.9 C)   Resp 17   Ht 5\' 6"  (1.676 m)   Wt 101.8 kg   SpO2 92%   BMI 36.22 kg/m  Constitutional: No distress . Vital signs reviewed. HENT: Normocephalic.  Atraumatic. Eyes: EOMI. No discharge. Cardiovascular: No JVD.  RRR. Respiratory: Normal effort.  No stridor.  Bilateral clear to auscultation. GI: Non-distended.  BS +. Skin: Warm and dry.  Intact. Psych: Flat.  Slowed. Musc: No edema in extremities.  No tenderness in extremities. Neuro: Alert Right lean, unchanged Motor: Left upper extremity: 4-/5 proximal distal Left lower extremity: Hip flexion, knee extension 3+-4 -/5, ankle dorsiflexion 3+/5   Assessment/Plan: 1. Functional deficits which require 3+ hours per day of interdisciplinary therapy in a comprehensive inpatient rehab setting.  Physiatrist is providing close team supervision and 24 hour management of active medical problems listed below.  Physiatrist and rehab team continue to assess barriers to discharge/monitor patient progress toward functional and medical goals   Care Tool:  Bathing    Body parts bathed by  patient: Left arm,Chest,Abdomen,Front perineal area,Buttocks,Right upper leg,Left upper leg,Face,Right lower leg,Right arm   Body parts bathed by helper: Left lower leg     Bathing assist Assist Level: Minimal Assistance - Patient > 75%     Upper Body Dressing/Undressing Upper body dressing   What is the patient wearing?: Pull over shirt,Bra    Upper body assist Assist Level: Moderate Assistance - Patient 50 - 74% (max bra, CGA and max verbal cues shirt)    Lower Body Dressing/Undressing Lower body dressing      What is the patient wearing?: Pants,Incontinence brief     Lower body assist Assist for lower body dressing: Maximal Assistance - Patient 25 - 49%     Toileting Toileting    Toileting assist Assist for toileting: Minimal Assistance - Patient > 75% (urination at 3 in 1 commode)     Transfers Chair/bed transfer  Transfers assist     Chair/bed transfer assist level: Minimal Assistance - Patient > 75%     Locomotion Ambulation   Ambulation assist      Assist level: Minimal Assistance - Patient > 75% Assistive device: Walker-rolling Max distance: >60 ft   Walk 10 feet activity   Assist     Assist level: Minimal Assistance - Patient > 75% Assistive device: Walker-rolling   Walk 50 feet activity   Assist    Assist level: Minimal Assistance - Patient > 75% Assistive device: Walker-rolling    Walk 150  feet activity   Assist Walk 150 feet activity did not occur: Safety/medical concerns  Assist level: Minimal Assistance - Patient > 75% Assistive device: Walker-rolling    Walk 10 feet on uneven surface  activity   Assist Walk 10 feet on uneven surfaces activity did not occur: Safety/medical concerns   Assist level: Minimal Assistance - Patient > 75% Assistive device: Aeronautical engineer Will patient use wheelchair at discharge?: No      Wheelchair assist level: Dependent - Patient 0% Max wheelchair  distance: 200    Wheelchair 50 feet with 2 turns activity    Assist        Assist Level: Dependent - Patient 0%   Wheelchair 150 feet activity     Assist      Assist Level: Dependent - Patient 0%   Medical Problem List and Plan: 1.  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  Continue CIR  Repeat stat CT showing unchanged size of hematoma.  No changes per neurology. 2.  Antithrombotics: -DVT/anticoagulation: SCDs             -antiplatelet therapy: N/A 3. Pain Management: Tylenol as needed  Chronic left foot pain:   Does not appear to limit therapies   Voltaren gel to left ankle    Lidoderm patch ordered for left foot, with some improvement  Appears controlled with meds on 2/18 4. Mood: Provide emotional support             -antipsychotic agents: N/A 5. Neuropsych: This patient is not fully capable of making decisions on her own behalf.  Ritalin increased to 5 mg twice daily on 2/12 with improvement 6. Skin/Wound Care: Routine skin checks 7. Fluids/Electrolytes/Nutrition: Routine in and outs 8.  Diabetes mellitus type II with hyperglycemia.  Hemoglobin A1c 6.2.  Continue Glucophage 1000 mg daily.     CBG (last 3)  Recent Labs    07/05/20 1612 07/05/20 2049 07/06/20 0600  GLUCAP 108* 107* 120*   Relatively controlled on 2/18 9.  Hypertension.   HCTZ 25 mg daily, decreased to 12.5 on 2/10, DC'd on 2/15  Controlled for the most part, however some lability on 2/18  Monitor with increased mobility 10.  History of kidney stones with stent placement 2017 11. Obesity (BMI 36.48): Provide dietary counseling 12.  Hypokalemia  Potassium 3.7 on 2/18  Supplemented x2 days on 2/11-2/12  Continue to monitor  LOS: 11 days A FACE TO FACE EVALUATION WAS PERFORMED  Brodi Nery Lorie Phenix 07/06/2020, 9:59 AM

## 2020-07-06 NOTE — Progress Notes (Signed)
Occupational Therapy Session Note  Patient Details  Name: Cheryl Ortiz MRN: [id number] Date of Birth: November 11, 1950  Today's Date: 07/06/2020 OT Individual Time: 6761-9509 OT Individual Time Calculation (min): 59 min    Short Term Goals: Week 2:  OT Short Term Goal 1 (Week 2): Pt will demonstrate improved L side awareness when stepping into the shower only needing 1 verbal cue to fully step to her L.. OT Short Term Goal 2 (Week 2): Pt will don pants with mod A or less. OT Short Term Goal 3 (Week 2): Pt will don a shirt with verbal cues only. OT Short Term Goal 4 (Week 2): Pt will complete toilet transfer with min A during a stand pivot.  Skilled Therapeutic Interventions/Progress Updates:    Pt in bed slumped down with HOB slightly elevated, angled at a diagonal with LLE off bed.  Pt reports she wasn't trying to get out of bed, she just "slid down".  Pt educated on use of call bell when needing assist to reposition for safety.  Pt exhibited fair to poor emergent and anticipatory awareness despite education provided.  Pt completed supine to sit EOB with min assist for trunk support.  Pt required total assist to donn bilateral shoes despite max multimodal cues with pt appearing internally distracted and reports she is unable to cross RLE due to right knee pain. Nurse made aware and nurse applied voltaren gel.  Stand pivot to w/c with min assist using RW.  Pt transported to day room and participated seated tabletop activities to facilitate left sided attention, sustained and focused attention, and problem solving.  Pt required min to mod VCs for problem solving and attention to successfully complete.  Ball toss/catch/bounce activity completed to facilitate LUE attention requiring mod to max VC's to stay on task and attend to left.  Pt requesting to use bathroom. Transported to toilet and pt completed stand pivot using grab bars with CGA.  Pt had continent episode of urine.  Clothing mgt and pericare  completed with min assist.  Stand pivot toilet to w/c with CGA.  Pt in room with SLP arrival, call bell in reach.  Therapy Documentation Precautions:  Precautions Precautions: Fall Restrictions Weight Bearing Restrictions: No   Therapy/Group: Individual Therapy  Ezekiel Slocumb 07/06/2020, 3:56 PM

## 2020-07-06 NOTE — Progress Notes (Signed)
Speech Language Pathology Daily Session Note  Patient Details  Name: Cheryl Ortiz MRN: [id number] Date of Birth: 03-Jun-1950  Today's Date: 07/06/2020 SLP Individual Time: 0820-0850 SLP Individual Time Calculation (min): 30 min  Short Term Goals: Week 2: SLP Short Term Goal 1 (Week 2): STG=LTG due to remaining length of stay  Skilled Therapeutic Interventions:   Patient seen for skilled ST session focusing on cognitive-linguistic goals. She had finished getting NT assistance for getting dressed and brief change. She was able to sit up at edge of bed with only verbal cues but did require moderateA level of verbal cues from SLP but with patient leaning on left. Prior to attempt to stand for transfer to wheelchair, patient's stomach was gurgling and patient then stated she needed to toilet, however SLP checked her brief which was soiled. NT then assisted with her care. Patient continues to benefit from skilled SLP intervention to maximize cognitive-linguistic goals prior to discharge.  Pain Pain Assessment Pain Scale: 0-10 Pain Score: 0-No pain  Therapy/Group: Individual Therapy  Sonia Baller, MA, CCC-SLP Speech Therapy

## 2020-07-06 NOTE — Progress Notes (Addendum)
Physical Therapy Session Note  Patient Details  Name: Cheryl Ortiz MRN: [id number] Date of Birth: 1951-01-13  Today's Date: 07/06/2020 PT Individual Time: 1000-1042 PT Individual Time Calculation (min): 42 min   Short Term Goals: Week 2:  PT Short Term Goal 1 (Week 2): pt will consistently complete bed mobility with modA PT Short Term Goal 2 (Week 2): Pt will complete bed<>chair transfers with modA and LRAD PT Short Term Goal 3 (Week 2): Pt will ambulate 183ft with minA and LRAD PT Short Term Goal 4 (Week 2): Pt will initiate stair training  Skilled Therapeutic Interventions/Progress Updates:    Pt received supine in bed, awake and agreeable to therapy. No reports of pain but requesting to change t-shirts into one of her own instead of hospital issued scrub shirt. Able to perform supine<>sit with CGA with use of bed features. Able to scoot forwards towards EOB with supervision and mod cues for initiation. She was able to remove scrub t-shirt with minA and also required minA for donning a new clean t-shirt. Cues for hemi-technique and L attention throughout (pt will leave L arm in dirty shirt while attempting to don clean shirt). Donned tennis shoes with totalA but unable to don L shoe due to swelling, therefore had to change to different pair of shoes. Pt reporting need to have a BM and she requested bed height be elevated to stand up. Encouraged her to stand without bed features but she refused. Raised bed height for time management and patient eventually able to stand with minA (for initiation) to RW and she ambulated with close supervision and RW to bathroom. She required modA for pulling down pants and totalA for removing dirty briefs. Able to sit to 3-1 [facility] with CGA for controlled lowering. Instructed patient on pull-cord for staff assist and she voiced understanding. RN also made aware and hand-off of care to RN.  Pt missed 18 minutes of skilled therapy as patient needed toileting  care.  *pt required significant ++ time for initiation/attention to complete functional mobility tasks throughout our session.  Therapy Documentation Precautions:  Precautions Precautions: Fall Restrictions Weight Bearing Restrictions: No General: PT Amount of Missed Time (min): 18 Minutes PT Missed Treatment Reason: Nursing care;Other (Comment) (toileting)  Therapy/Group: Individual Therapy  Mercy Malena P Analaya Hoey PT 07/06/2020, 7:31 AM

## 2020-07-06 NOTE — Progress Notes (Signed)
Speech Language Pathology Daily Session Note  Patient Details  Name: Cheryl Ortiz MRN: [id number] Date of Birth: [birth year]  Today's Date: 07/06/2020 SLP Individual Time: 1500-1530 SLP Individual Time Calculation (min): 30 min  Short Term Goals: Week 2: SLP Short Term Goal 1 (Week 2): STG=LTG due to remaining length of stay  Skilled Therapeutic Interventions:   Patient seen in PM for ST session focusing on cognitive-linguistic goals. Patient had just finished OT session and was sitting in [place]. She was agreeable to coming to speech therapy room for session. She is aware that she isn't speaking loudly enough and demonstrates overall good recall of recent events and therapies, however she does not demonstrate ability to self-correct and is not able to achieve adequate vocal intensity without modA cues. Patient demonstrated improved attention during task today and was not as distracted, however she did require min-modA verbal cues to redirect. She continues to benefit from skilled SLP intervention to maximize cognitive-linguistic functioning prior to discharge.   Pain Pain Assessment Pain Scale: 0-10 Pain Score: 0-No pain  Therapy/Group: Individual Therapy  Sonia Baller, MA, CCC-SLP Speech Therapy

## 2020-07-07 LAB — GLUCOSE, CAPILLARY
Glucose-Capillary: 119 mg/dL — ABNORMAL HIGH (ref 70–99)
Glucose-Capillary: 102 mg/dL — ABNORMAL HIGH (ref 70–99)
Glucose-Capillary: 97 mg/dL (ref 70–99)
Glucose-Capillary: 111 mg/dL — ABNORMAL HIGH (ref 70–99)

## 2020-07-07 NOTE — Progress Notes (Signed)
Speech Language Pathology Daily Session Note  Patient Details  Name: BRITLYN MARTINE MRN: [id number] Date of Birth: 1950/10/30  Today's Date: 07/07/2020 SLP Individual Time: 7737-3668 SLP Individual Time Calculation (min): 45 min  Short Term Goals: Week 2: SLP Short Term Goal 1 (Week 2): STG=LTG due to remaining length of stay  Skilled Therapeutic Interventions: Skilled treatment session focused on cognitive goals. SLP facilitated session by providing overall Mod A multimodal cues for patient to utilize an increased vocal intensity to achieve overall speech intelligibility to ~75% at the sentence level. Patient required Mod verbal cues for sustained attention to self-feeding with her breakfast meal due to taking consistent breaks. Patient reported stomach discomfort and was incontinent of bowel. Mod verbal cues were needed for functional problem solving throughout task. Patient left supine in bed with NT present. Continue with current plan of care.      Pain No/Denies Pain   Therapy/Group: Individual Therapy  Eda Magnussen 07/07/2020, 2:30 PM

## 2020-07-07 NOTE — Progress Notes (Signed)
Occupational Therapy Session Note  Patient Details  Name: Cheryl Ortiz MRN: [id number] Date of Birth: November 05, 1950  Today's Date: 07/07/2020 OT Individual Time: 1400-1445 OT Individual Time Calculation (min): 45 min    Short Term Goals: Week 1:  OT Short Term Goal 1 (Week 1): Pt will complete toilet transfer with min A during a stand pivot. OT Short Term Goal 1 - Progress (Week 1): Progressing toward goal OT Short Term Goal 2 (Week 1): Pt will don a shirt with verbal cues only. OT Short Term Goal 2 - Progress (Week 1): Progressing toward goal OT Short Term Goal 3 (Week 1): Pt will don pants with mod A or less. OT Short Term Goal 3 - Progress (Week 1): Progressing toward goal OT Short Term Goal 4 (Week 1): Pt will be able to actively use L arm to reach across and wash R arm. OT Short Term Goal 4 - Progress (Week 1): Met OT Short Term Goal 5 (Week 1): Pt will demonstrate improved L side awareness when stepping into the shower only needing 1 verbal cue to fully step to her L.. OT Short Term Goal 5 - Progress (Week 1): Progressing toward goal  Skilled Therapeutic Interventions/Progress Updates:    1;1. Pt received in bed agreeable to OT. Pt with BM smell seated in w/c. Pt reporting she had had BM. Pt stand pivot to toilet with MIN A using grab bar and VC for hand placement/weight shift. Pt with large liquid stool in brief and more in toilet (RN notified to provide imodium). Pt pants, brief and periarea/buttocks completely soiled. Elected to use shower to clean. MAX A for time management for doffing and donning clothing. Pt required increased time to process all commands and demo L inattention throughout dressing tasks. Pt requires direct VC for thorough bathing of peri area as pt perseverative on bathing lower thighs. Pt returned to w/c in room exit alarm on and call light in reach.  Therapy Documentation Precautions:  Precautions Precautions: Fall Restrictions Weight Bearing  Restrictions: No General:   Vital Signs: Therapy Vitals Temp: 98.4 F (36.9 C) Temp Source: Oral Pulse Rate: 74 Resp: 16 BP: 125/74 Patient Position (if appropriate): Lying Oxygen Therapy SpO2: 97 % O2 Device: Room Air Pain:   ADL: ADL Eating: Set up Grooming: Setup Upper Body Bathing: Moderate cueing Where Assessed-Upper Body Bathing: Shower Lower Body Bathing: Moderate cueing,Minimal assistance Where Assessed-Lower Body Bathing: Shower Upper Body Dressing: Moderate cueing,Minimal assistance Where Assessed-Upper Body Dressing: Wheelchair Lower Body Dressing: Maximal assistance Where Assessed-Lower Body Dressing: Wheelchair Toileting: Moderate assistance,Moderate cueing Where Assessed-Toileting: Glass blower/designer: Psychiatric nurse Method: Arts development officer: Energy manager: Environmental education officer Method: Radiographer, therapeutic: Research scientist (life sciences)   Exercises:   Other Treatments:     Therapy/Group: Individual Therapy  Tonny Branch 07/07/2020, 6:52 AM

## 2020-07-07 NOTE — Plan of Care (Signed)
  Problem: Consults Goal: RH STROKE PATIENT EDUCATION Description: See Patient Education module for education specifics  Outcome: Progressing Goal: Diabetes Guidelines if Diabetic/Glucose > 140 Description: If diabetic or lab glucose is > 140 mg/dl - Initiate Diabetes/Hyperglycemia Guidelines & Document Interventions  Outcome: Progressing   Problem: RH SKIN INTEGRITY Goal: RH STG MAINTAIN SKIN INTEGRITY WITH ASSISTANCE Description: STG Maintain Skin Integrity With Supervision Assistance. Outcome: Progressing   Problem: RH SAFETY Goal: RH STG ADHERE TO SAFETY PRECAUTIONS W/ASSISTANCE/DEVICE Description: STG Adhere to Safety Precautions With supervisionAssistance/Device. Outcome: Progressing   Problem: RH PAIN MANAGEMENT Goal: RH STG PAIN MANAGED AT OR BELOW PT'S PAIN GOAL Description: <3 on a 0-10 pain scale. Outcome: Progressing

## 2020-07-07 NOTE — Progress Notes (Signed)
PHYSICAL MEDICINE & REHABILITATION PROGRESS NOTE  Subjective/Complaints: No complaints this morning Denies pain Daughter is at bedside  ROS: Denies CP, shortness of breath, nausea, vomiting and diarrhea.   Objective: Vital Signs: Blood pressure 125/74, pulse 74, temperature 98.4 F (36.9 C), temperature source Oral, resp. rate 16, height 5\' 6"  (1.676 m), weight 101.8 kg, SpO2 97 %. No results found. No results for input(s): WBC, HGB, HCT, PLT in the last 72 hours. Recent Labs    07/06/20 0531  NA 135  K 3.7  CL 102  CO2 21*  GLUCOSE 120*  BUN 25*  CREATININE 0.99  CALCIUM 9.4    Intake/Output Summary (Last 24 hours) at 07/07/2020 1128 Last data filed at 07/07/2020 0900 Gross per 24 hour  Intake 420 ml  Output --  Net 420 ml        Physical Exam: BP 125/74 (BP Location: Left Arm)   Pulse 74   Temp 98.4 F (36.9 C) (Oral)   Resp 16   Ht 5\' 6"  (1.676 m)   Wt 101.8 kg   SpO2 97%   BMI 36.22 kg/m  Gen: no distress, normal appearing HEENT: oral mucosa pink and moist, NCAT Cardio: Reg rate Chest: normal effort, normal rate of breathing Abd: soft, non-distended Ext: no edema Psych: Flat.  Slowed. Musc: No edema in extremities.  No tenderness in extremities. Neuro: Alert Right lean, unchanged Motor: Left upper extremity: 4-/5 proximal distal Left lower extremity: Hip flexion, knee extension 3+-4 -/5, ankle dorsiflexion 3+/5   Assessment/Plan: 1. Functional deficits which require 3+ hours per day of interdisciplinary therapy in a comprehensive inpatient rehab setting.  Physiatrist is providing close team supervision and 24 hour management of active medical problems listed below.  Physiatrist and rehab team continue to assess barriers to discharge/monitor patient progress toward functional and medical goals   Care Tool:  Bathing    Body parts bathed by patient: Left arm,Chest,Abdomen,Front perineal area,Buttocks,Right upper leg,Left upper  leg,Face,Right lower leg,Right arm   Body parts bathed by helper: Left lower leg     Bathing assist Assist Level: Minimal Assistance - Patient > 75%     Upper Body Dressing/Undressing Upper body dressing   What is the patient wearing?: Pull over shirt,Bra    Upper body assist Assist Level: Moderate Assistance - Patient 50 - 74% (max bra, CGA and max verbal cues shirt)    Lower Body Dressing/Undressing Lower body dressing      What is the patient wearing?: Pants,Incontinence brief     Lower body assist Assist for lower body dressing: Maximal Assistance - Patient 25 - 49%     Toileting Toileting    Toileting assist Assist for toileting: Minimal Assistance - Patient > 75%     Transfers Chair/bed transfer  Transfers assist     Chair/bed transfer assist level: Minimal Assistance - Patient > 75%     Locomotion Ambulation   Ambulation assist      Assist level: Minimal Assistance - Patient > 75% Assistive device: Walker-rolling Max distance: >60 ft   Walk 10 feet activity   Assist     Assist level: Minimal Assistance - Patient > 75% Assistive device: Walker-rolling   Walk 50 feet activity   Assist    Assist level: Minimal Assistance - Patient > 75% Assistive device: Walker-rolling    Walk 150 feet activity   Assist Walk 150 feet activity did not occur: Safety/medical concerns  Assist level: Minimal Assistance - Patient > 75% Assistive  device: Walker-rolling    Walk 10 feet on uneven surface  activity   Assist Walk 10 feet on uneven surfaces activity did not occur: Safety/medical concerns   Assist level: Minimal Assistance - Patient > 75% Assistive device: Aeronautical engineer Will patient use wheelchair at discharge?: No      Wheelchair assist level: Dependent - Patient 0% Max wheelchair distance: 200    Wheelchair 50 feet with 2 turns activity    Assist        Assist Level: Dependent - Patient 0%    Wheelchair 150 feet activity     Assist      Assist Level: Dependent - Patient 0%   Medical Problem List and Plan: 1.  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  Continue CIR  Repeat stat CT showing unchanged size of hematoma.  No changes per neurology. 2.  Antithrombotics: -DVT/anticoagulation: SCDs             -antiplatelet therapy: N/A 3. Pain Management: Continue Tylenol as needed  Chronic left foot pain:   Does not appear to limit therapies   Voltaren gel to left ankle    Lidoderm patch ordered for left foot, with some improvement  Appears controlled with meds on 2/19 4. Mood: Provide emotional support             -antipsychotic agents: N/A 5. Neuropsych: This patient is not fully capable of making decisions on her own behalf.  Ritalin increased to 5 mg twice daily on 2/12 with improvement 6. Skin/Wound Care: Routine skin checks 7. Fluids/Electrolytes/Nutrition: Routine in and outs 8.  Diabetes mellitus type II with hyperglycemia.  Hemoglobin A1c 6.2.  Continue Glucophage 1000 mg daily.     CBG (last 3)  Recent Labs    07/06/20 1647 07/06/20 2104 07/07/20 0611  GLUCAP 102* 110* 119*   Relatively controlled on 2/19- continue to monitor 9.  Hypertension.   HCTZ 25 mg daily, decreased to 12.5 on 2/10, DC'd on 2/15  Controlled for the most part, however some lability on 2/18  Monitor with increased mobility 10.  History of kidney stones with stent placement 2017 11. Obesity (BMI 36.48): Provide dietary counseling 12.  Hypokalemia  Potassium 3.7 on 2/18  Supplemented x2 days on 2/11-2/12  Continue to monitor 13. Disposition: daughter requests that medications be sent to [facility] in [place].   LOS: 12 days A FACE TO FACE EVALUATION WAS PERFORMED  Martha Clan P Kimiko Common 07/07/2020, 11:28 AM

## 2020-07-08 LAB — GLUCOSE, CAPILLARY
Glucose-Capillary: 119 mg/dL — ABNORMAL HIGH (ref 70–99)
Glucose-Capillary: 114 mg/dL — ABNORMAL HIGH (ref 70–99)
Glucose-Capillary: 142 mg/dL — ABNORMAL HIGH (ref 70–99)
Glucose-Capillary: 97 mg/dL (ref 70–99)

## 2020-07-08 NOTE — Plan of Care (Signed)
  Problem: Consults Goal: RH STROKE PATIENT EDUCATION Description: See Patient Education module for education specifics  Outcome: Progressing Goal: Diabetes Guidelines if Diabetic/Glucose > 140 Description: If diabetic or lab glucose is > 140 mg/dl - Initiate Diabetes/Hyperglycemia Guidelines & Document Interventions  Outcome: Progressing   Problem: RH SKIN INTEGRITY Goal: RH STG MAINTAIN SKIN INTEGRITY WITH ASSISTANCE Description: STG Maintain Skin Integrity With Supervision Assistance. Outcome: Progressing   Problem: RH SAFETY Goal: RH STG ADHERE TO SAFETY PRECAUTIONS W/ASSISTANCE/DEVICE Description: STG Adhere to Safety Precautions With supervisionAssistance/Device. Outcome: Progressing   Problem: RH PAIN MANAGEMENT Goal: RH STG PAIN MANAGED AT OR BELOW PT'S PAIN GOAL Description: <3 on a 0-10 pain scale. Outcome: Progressing   Problem: RH KNOWLEDGE DEFICIT Goal: RH STG INCREASE KNOWLEDGE OF DIABETES Description: Patient will be able to demonstrate knowledge of signs and symptoms of hypoglycemia and hyperglycemia, medication management, dietary management with educational materials and handouts provided by staff. Outcome: Progressing Goal: RH STG INCREASE KNOWLEDGE OF HYPERTENSION Description: Patient will be able to demonstrate knowledge of BP control and parameters as set by the MD, medication management, dietary management, signs and symptoms of HTN with educational materials and handouts provided by staff. Outcome: Progressing Goal: RH STG INCREASE KNOWLEGDE OF HYPERLIPIDEMIA Description: Patient will be able to demonstrate knowledge of lab values associated with HLD, medication management, exercise recommendations with educational materials and handouts provided by staff. Outcome: Progressing Goal: RH STG INCREASE KNOWLEDGE OF STROKE PROPHYLAXIS Description: Patient will be able to demonstrate knowledge of medications used to aid in prevention of future strokes with  educational materials and handouts provided by staff. Outcome: Progressing   Problem: RH Vision Goal: RH LTG Vision (Specify) Outcome: Progressing

## 2020-07-08 NOTE — Progress Notes (Signed)
[facility] PHYSICAL MEDICINE & REHABILITATION PROGRESS NOTE  Subjective/Complaints: No complaints this morning   ROS: Denies CP, shortness of breath, nausea, vomiting and diarrhea.   Objective: Vital Signs: Blood pressure 120/68, pulse 88, temperature 97.6 F (36.4 C), temperature source Oral, resp. rate 17, height 5\' 6"  (1.676 m), weight 101.8 kg, SpO2 96 %. No results found. No results for input(s): WBC, HGB, HCT, PLT in the last 72 hours. Recent Labs    07/06/20 0531  NA 135  K 3.7  CL 102  CO2 21*  GLUCOSE 120*  BUN 25*  CREATININE 0.99  CALCIUM 9.4    Intake/Output Summary (Last 24 hours) at 07/08/2020 1325 Last data filed at 07/08/2020 0854 Gross per 24 hour  Intake 120 ml  Output -  Net 120 ml        Physical Exam: BP 120/68   Pulse 88   Temp 97.6 F (36.4 C) (Oral)   Resp 17   Ht 5\' 6"  (1.676 m)   Wt 101.8 kg   SpO2 96%   BMI 36.22 kg/m  Gen: no distress, normal appearing HEENT: oral mucosa pink and moist, NCAT Cardio: Reg rate Chest: normal effort, normal rate of breathing Abd: soft, non-distended Ext: no edema  Psych: Flat.  Slowed. Musc: No edema in extremities.  No tenderness in extremities. Neuro: Alert Right lean, unchanged Motor: Left upper extremity: 4-/5 proximal distal Left lower extremity: Hip flexion, knee extension 3+-4 -/5, ankle dorsiflexion 3+/5   Assessment/Plan: 1. Functional deficits which require 3+ hours per day of interdisciplinary therapy in a comprehensive inpatient rehab setting.  Physiatrist is providing close team supervision and 24 hour management of active medical problems listed below.  Physiatrist and rehab team continue to assess barriers to discharge/monitor patient progress toward functional and medical goals   Care Tool:  Bathing    Body parts bathed by patient: Left arm,Chest,Abdomen,Front perineal area,Buttocks,Right upper leg,Left upper leg,Face,Right lower leg,Right arm   Body parts bathed by  helper: Left lower leg     Bathing assist Assist Level: Minimal Assistance - Patient > 75%     Upper Body Dressing/Undressing Upper body dressing   What is the patient wearing?: Pull over shirt,Bra    Upper body assist Assist Level: Moderate Assistance - Patient 50 - 74% (max bra, CGA and max verbal cues shirt)    Lower Body Dressing/Undressing Lower body dressing      What is the patient wearing?: Pants,Incontinence brief     Lower body assist Assist for lower body dressing: Maximal Assistance - Patient 25 - 49%     Toileting Toileting    Toileting assist Assist for toileting: Minimal Assistance - Patient > 75%     Transfers Chair/bed transfer  Transfers assist     Chair/bed transfer assist level: Minimal Assistance - Patient > 75%     Locomotion Ambulation   Ambulation assist      Assist level: Minimal Assistance - Patient > 75% Assistive device: Walker-rolling Max distance: >60 ft   Walk 10 feet activity   Assist     Assist level: Minimal Assistance - Patient > 75% Assistive device: Walker-rolling   Walk 50 feet activity   Assist    Assist level: Minimal Assistance - Patient > 75% Assistive device: Walker-rolling    Walk 150 feet activity   Assist Walk 150 feet activity did not occur: Safety/medical concerns  Assist level: Minimal Assistance - Patient > 75% Assistive device: Walker-rolling    Walk 10 feet  on uneven surface  activity   Assist Walk 10 feet on uneven surfaces activity did not occur: Safety/medical concerns   Assist level: Minimal Assistance - Patient > 75% Assistive device: Aeronautical engineer Will patient use wheelchair at discharge?: No      Wheelchair assist level: Dependent - Patient 0% Max wheelchair distance: 200    Wheelchair 50 feet with 2 turns activity    Assist        Assist Level: Dependent - Patient 0%   Wheelchair 150 feet activity     Assist       Assist Level: Dependent - Patient 0%   Medical Problem List and Plan: 1.  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  Continue CIR  Repeat stat CT showing unchanged size of hematoma.  No changes per neurology. 2.  Antithrombotics: -DVT/anticoagulation: SCDs             -antiplatelet therapy: N/A 3. Pain Management: Continue Tylenol as needed  Chronic left foot pain:   Does not appear to limit therapies   Voltaren gel to left ankle    Lidoderm patch ordered for left foot, with some improvement  Appears controlled with meds on 2/19 4. Mood: Provide emotional support             -antipsychotic agents: N/A 5. Neuropsych: This patient is not fully capable of making decisions on her own behalf.  Ritalin increased to 5 mg twice daily on 2/12 with improvement, continue 6. Skin/Wound Care: Routine skin checks 7. Fluids/Electrolytes/Nutrition: Routine in and outs 8.  Diabetes mellitus type II with hyperglycemia.  Hemoglobin A1c 6.2.  Continue Glucophage 1000 mg daily.     CBG (last 3)  Recent Labs    07/07/20 2030 07/08/20 0611 07/08/20 1149  GLUCAP 111* 119* 114*   Relatively controlled on 2/20- continue to monitor 9.  Hypertension.   HCTZ 25 mg daily, decreased to 12.5 on 2/10, DC'd on 2/15  Controlled for the most part, however some lability on 2/18  Monitor with increased mobility 10.  History of kidney stones with stent placement 2017 11. Obesity (BMI 36.48): Provide dietary counseling 12.  Hypokalemia  Potassium 3.7 on 2/18  Supplemented x2 days on 2/11-2/12  Continue to monitor 13. Disposition: daughter requests that medications be sent to [facility] in [place].   LOS: 13 days A FACE TO FACE EVALUATION WAS PERFORMED  Michal Strzelecki P Willie [place] 07/08/2020, 1:25 PM

## 2020-07-09 LAB — GLUCOSE, CAPILLARY
Glucose-Capillary: 120 mg/dL — ABNORMAL HIGH (ref 70–99)
Glucose-Capillary: 117 mg/dL — ABNORMAL HIGH (ref 70–99)
Glucose-Capillary: 89 mg/dL (ref 70–99)

## 2020-07-09 NOTE — Progress Notes (Signed)
Occupational Therapy Session Note  Patient Details  Name: Cheryl Ortiz MRN: [id number] Date of Birth: 1951-02-01  Today's Date: 07/09/2020 OT Individual Time: 9379-0240 OT Individual Time Calculation (min): 68 min    Short Term Goals: Week 2:  OT Short Term Goal 1 (Week 2): Pt will demonstrate improved L side awareness when stepping into the shower only needing 1 verbal cue to fully step to her L.. OT Short Term Goal 2 (Week 2): Pt will don pants with mod A or less. OT Short Term Goal 3 (Week 2): Pt will don a shirt with verbal cues only. OT Short Term Goal 4 (Week 2): Pt will complete toilet transfer with min A during a stand pivot.  Skilled Therapeutic Interventions/Progress Updates:    Pt semi upright in bed, requesting to use bathroom, no c/o pain throughout session.  Pt completed left rolling with supervision using bed rail and sidelying to sit with mod assist for trunk control due to left neglect not using LUE or LLE to assist despite cueing.  Sit to stand requiring increased time and repetitive VCs to initiate.  Ambulated to bathroom with supervision using RW.  Toilet transfer with CGA and toileting completed with mod assist for pericare thoroughness due to body habitus, and to pull pants and brief over left side of hip due to limited LUE functional use despite multmodal cueing.  Pt ambulated to w/c and required max multimodal cueing to bring LLE back due to pt only stepping back with RLE and then standing still without initiating more movement until cue'd. When discussing presence of left sided neglec pt does appear to have intellectual awareness of limitations, however, emergent awareness appears to be impaired during self care task and functional mobility completion.    Pt requesting to wash hands and brush teeth at sink.  Pt ambulated to sink using RW with CGA.  Pt stood at sink to complete oral care and hand hygiene with supervision. Pt reports she wants to return to bed, however  upon ambulating, pt went to w/c and turned to sit.  Pt required max VCs to bring LLE back and initiate stand to sit.  Upon sitting, pt reports she did indeed want to get in bed and was not sure why she went to the w/c instead.  Pt completed stand pivot w/c to EOB with CGA and max VCs for sequencing.  Pt completed sit to supine with mod assist. Pt then picking up her phone and very distracted, requiring max VCs to redirect to safe repositioning in bed.  Min assist and step by step VCs required to reposition towards HOB using bridging technique. Call bell in reach, bed alarm on, at end of session.    Therapy Documentation Precautions:  Precautions Precautions: Fall Restrictions Weight Bearing Restrictions: No   Therapy/Group: Individual Therapy  Ezekiel Slocumb 07/09/2020, 3:50 PM

## 2020-07-09 NOTE — Progress Notes (Signed)
Physical Therapy Session Note  Patient Details  Name: Cheryl Ortiz MRN: [id number] Date of Birth: 11-Jun-1950  Today's Date: 07/09/2020 PT Individual Time: 1100-1158 PT Individual Time Calculation (min): 58 min   Short Term Goals: Week 2:  PT Short Term Goal 1 (Week 2): pt will consistently complete bed mobility with modA PT Short Term Goal 2 (Week 2): Pt will complete bed<>chair transfers with modA and LRAD PT Short Term Goal 3 (Week 2): Pt will ambulate 157ft with minA and LRAD PT Short Term Goal 4 (Week 2): Pt will initiate stair training  Skilled Therapeutic Interventions/Progress Updates:    Pt received supine in bed, fully dressed, agreeable to therapy. No reports of pain. Required minA for supine to sit but primarily just for initiation. Able to forward scoot at EOB with CGA but mod cues for initiating. Stand<>pivot with minA for rising and CGA for balance with no AD from EOB to w/c. W/c transport from her room to main rehab gym for time management. Focused remainder of session on functional gait training. She ambulated 231ft + 216ft with CGA and RW. Gait speed <0.2 m/s with step-to gait pattern. During 1st gait trial, cognitive task overlay with naming words starting with letter 'C', 'R', and 'B.' Pt frequently needing to stop gait to think of a word or she would otherwise just look for words along the hallway. She was able to name 5 words for each letter. Emphasized LOUD voice when providing words due to her significant hypophonic vocalization. 2nd gait trial placed playing cards along both sides of the hallway with instruction on locating, retrieving, and saying the words LOUDLY. She required mod to max cues for attending and locating the cards and minA for balance as she approached them to reach and grab them. Patient transported back to her room at the end of session on 5C. Stand<>pivot with CGA and no AD from w/c to EOB and required min/modA sit>supine for BLE management via reverse  log roll. Pt ended session supine in bed with HOB elevated, bed alarm on and needs within reach. Handoff of care to RN.  Therapy Documentation Precautions:  Precautions Precautions: Fall Restrictions Weight Bearing Restrictions: No  Therapy/Group: Individual Therapy  Franciszek Platten P Quinton Voth PT 07/09/2020, 7:32 AM

## 2020-07-09 NOTE — Progress Notes (Signed)
Occupational Therapy Session Note  Patient Details  Name: Cheryl Ortiz MRN: [id number] Date of Birth: 1950-09-05  Today's Date: 07/09/2020 OT Individual Time: 1000-1100 OT Individual Time Calculation (min): 60 min    Short Term Goals: Week 2:  OT Short Term Goal 1 (Week 2): Pt will demonstrate improved L side awareness when stepping into the shower only needing 1 verbal cue to fully step to her L.. OT Short Term Goal 2 (Week 2): Pt will don pants with mod A or less. OT Short Term Goal 3 (Week 2): Pt will don a shirt with verbal cues only. OT Short Term Goal 4 (Week 2): Pt will complete toilet transfer with min A during a stand pivot.  Skilled Therapeutic Interventions/Progress Updates:    Pt seen this session for ADL training of toileting, shower, dressing with a focus on motor planning, sequencing, attention. See ADL documentation below.  Pt continues to need considerable cues to maintain focus, move on to the next step as she tends to perseverate.  She often needs guidance to start the task, such as putting her feet into her pants then she will pull up to hips.  In standing she needs A to pull pants over hips as she had difficulty reaching arms behind to pull tight leggings up with increased strength. Pt also frequently calls out for her daughter even when told numerous times that her daughter is not present.   Pt did not want to continue sitting in wc and wanted to lay down.  She moved upper body to pillow but could not lift her legs at all onto bed. Once in supine, she was unable to adjust herself to be straight and made no attempt even with cues.  Total A to adjust her in bed.  Bed alarm on and all needs met.   Therapy Documentation Precautions:  Precautions Precautions: Fall Restrictions Weight Bearing Restrictions: No   Pain: Pain Assessment Pain Scale: 0-10 Pain Score: 0-No pain ADL: ADL Eating: Set up Grooming: Setup Upper Body Bathing: Moderate cueing Where  Assessed-Upper Body Bathing: Shower Lower Body Bathing: Moderate cueing,Minimal assistance Where Assessed-Lower Body Bathing: Shower Upper Body Dressing: Moderate cueing,Minimal assistance Where Assessed-Upper Body Dressing: Wheelchair Lower Body Dressing: Maximal assistance Where Assessed-Lower Body Dressing: Wheelchair Toileting: Moderate assistance,Moderate cueing Where Assessed-Toileting: Glass blower/designer: Psychiatric nurse Method: Arts development officer: Energy manager: Environmental education officer Method: Radiographer, therapeutic: Transfer tub bench,Grab bars   Therapy/Group: Individual Therapy  Auren Valdes 07/09/2020, 8:54 AM

## 2020-07-09 NOTE — Progress Notes (Signed)
[place] PHYSICAL MEDICINE & REHABILITATION PROGRESS NOTE  Subjective/Complaints: No new issues. On phone with mother. Pt happy about her progress!  ROS: Patient denies fever, rash, sore throat, blurred vision, nausea, vomiting, diarrhea, cough, shortness of breath or chest pain, joint or back pain, headache, or mood change.   Objective: Vital Signs: Blood pressure 105/60, pulse 83, temperature (!) 97.4 F (36.3 C), temperature source Axillary, resp. rate 18, height 5\' 6"  (1.676 m), weight 101.8 kg, SpO2 100 %. No results found. No results for input(s): WBC, HGB, HCT, PLT in the last 72 hours. No results for input(s): NA, K, CL, CO2, GLUCOSE, BUN, CREATININE, CALCIUM in the last 72 hours.  Intake/Output Summary (Last 24 hours) at 07/09/2020 1344 Last data filed at 07/09/2020 0700 Gross per 24 hour  Intake 580 ml  Output --  Net 580 ml        Physical Exam: BP 105/60 (BP Location: Left Arm)   Pulse 83   Temp (!) 97.4 F (36.3 C) (Axillary)   Resp 18   Ht 5\' 6"  (1.676 m)   Wt 101.8 kg   SpO2 100%   BMI 36.22 kg/m  Constitutional: No distress . Vital signs reviewed. HEENT: EOMI, oral membranes moist Neck: supple Cardiovascular: RRR without murmur. No JVD    Respiratory/Chest: CTA Bilaterally without wheezes or rales. Normal effort    GI/Abdomen: BS +, non-tender, non-distended Ext: no clubbing, cyanosis, or edema Psych: pleasant, a little flat. Musc: No edema in extremities.  No tenderness in extremities. Neuro: Alert. Follows commands. Improving insight and awareness Motor: Left upper extremity: 4-/5 proximal distal Left lower extremity: Hip flexion, knee extension 3+-4 -/5, ankle dorsiflexion 3+/5   Assessment/Plan: 1. Functional deficits which require 3+ hours per day of interdisciplinary therapy in a comprehensive inpatient rehab setting.  Physiatrist is providing close team supervision and 24 hour management of active medical problems listed  below.  Physiatrist and rehab team continue to assess barriers to discharge/monitor patient progress toward functional and medical goals   Care Tool:  Bathing    Body parts bathed by patient: Left arm,Chest,Abdomen,Front perineal area,Buttocks,Right upper leg,Left upper leg,Face,Right lower leg,Right arm   Body parts bathed by helper: Left lower leg     Bathing assist Assist Level: Minimal Assistance - Patient > 75%     Upper Body Dressing/Undressing Upper body dressing   What is the patient wearing?: Pull over shirt,Bra    Upper body assist Assist Level: Moderate Assistance - Patient 50 - 74%    Lower Body Dressing/Undressing Lower body dressing      What is the patient wearing?: Pants,Incontinence brief     Lower body assist Assist for lower body dressing: Maximal Assistance - Patient 25 - 49%     Toileting Toileting    Toileting assist Assist for toileting: Moderate Assistance - Patient 50 - 74%     Transfers Chair/bed transfer  Transfers assist     Chair/bed transfer assist level: Minimal Assistance - Patient > 75%     Locomotion Ambulation   Ambulation assist      Assist level: Minimal Assistance - Patient > 75% Assistive device: Walker-rolling Max distance: >60 ft   Walk 10 feet activity   Assist     Assist level: Minimal Assistance - Patient > 75% Assistive device: Walker-rolling   Walk 50 feet activity   Assist    Assist level: Minimal Assistance - Patient > 75% Assistive device: Walker-rolling    Walk 150 feet activity  Assist Walk 150 feet activity did not occur: Safety/medical concerns  Assist level: Minimal Assistance - Patient > 75% Assistive device: Walker-rolling    Walk 10 feet on uneven surface  activity   Assist Walk 10 feet on uneven surfaces activity did not occur: Safety/medical concerns   Assist level: Minimal Assistance - Patient > 75% Assistive device: Aeronautical engineer  Will patient use wheelchair at discharge?: No      Wheelchair assist level: Dependent - Patient 0% Max wheelchair distance: 200    Wheelchair 50 feet with 2 turns activity    Assist        Assist Level: Dependent - Patient 0%   Wheelchair 150 feet activity     Assist      Assist Level: Dependent - Patient 0%   Medical Problem List and Plan: 1.  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  Continue CIR  Repeat stat CT showing unchanged size of hematoma.  No changes per neurology  -appears to be improving cognitively  2.  Antithrombotics: -DVT/anticoagulation: SCDs             -antiplatelet therapy: N/A 3. Pain Management: Continue Tylenol as needed  Chronic left foot pain:   Does not appear to limit therapies   Voltaren gel to left ankle    Lidoderm patch ordered for left foot, with some improvement  Appears controlled with meds on 2/21 4. Mood: Provide emotional support             -antipsychotic agents: N/A 5. Neuropsych: This patient is not fully capable of making decisions on her own behalf.  Ritalin increased to 5 mg twice daily 2/12 with improvement--> continue 6. Skin/Wound Care: Routine skin checks 7. Fluids/Electrolytes/Nutrition: Routine in and outs 8.  Diabetes mellitus type II with hyperglycemia.  Hemoglobin A1c 6.2.  Continue Glucophage 1000 mg daily.     CBG (last 3)  Recent Labs    07/09/20 0537 07/09/20 1107 07/09/20 1158  GLUCAP 120* 117* 89   Relatively controlled on 2/21- continue to monitor 9.  Hypertension.   HCTZ 25 mg daily, decreased to 12.5 on 2/10, DC'd on 2/15  2/21 bp controlled 10.  History of kidney stones with stent placement 2017 11. Obesity (BMI 36.48): Provide dietary counseling 12.  Hypokalemia  Potassium 3.7 on 2/18  Supplemented x2 days on 2/11-2/12  Continue to monitor 13.  Disposition: daughter requests that medications be sent to [facility] in [place].   LOS: 14 days A FACE TO FACE EVALUATION WAS PERFORMED  Meredith Staggers 07/09/2020, 1:44 PM

## 2020-07-10 LAB — GLUCOSE, CAPILLARY
Glucose-Capillary: 78 mg/dL (ref 70–99)
Glucose-Capillary: 110 mg/dL — ABNORMAL HIGH (ref 70–99)
Glucose-Capillary: 105 mg/dL — ABNORMAL HIGH (ref 70–99)
Glucose-Capillary: 79 mg/dL (ref 70–99)
Glucose-Capillary: 88 mg/dL (ref 70–99)
Glucose-Capillary: 90 mg/dL (ref 70–99)

## 2020-07-10 NOTE — Consult Note (Signed)
Neuropsychological Consultation   Patient:   Cheryl Ortiz   DOB:   March 16, 1951  MR Number:  [id number]  Location:  [place] [facility] [id number] [place] [address] Dept: [place]: [phone]           Date of Service:   07/10/2020  Start Time:   1 PM End Time:   2 PM  Provider/Observer:  Ilean Skill, Psy.D.       Clinical Neuropsychologist       Billing Code/Service: [phone]  Chief Complaint:    Cheryl Ortiz is a 70 year old female with history of diabetes with peripheral neuropathy, hypertension, hyperlipidemia, kidney stones with stent placement 2017 and quitting smoking 22 years ago.  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  Patient presented on 06/22/2020 with acute onset of left-sided weakness.  Cranial CT scan showed an area of hemorrhage within the anterior medial right frontal lobe with adjacent small amount of blood in the left side within the left frontal lobe or subarachnoid blood.  MRI of the brain right frontal and smaller left frontal intraparenchymal hemorrhage likely small in size with comparing across imaging modalities.  Small volume hemorrhage suspected traits right frontal subarachnoid hemorrhage as well.  No surrounding restricted diffusion to suggest hemorrhagic transformation or infarct.  Patient has been on the unit for some time and has continued with slow progress including left-sided weakness and impaired executive functioning and significant attentional deficits and deficits with initiated behavior.  Patient has required max cueing for attention with reduced awareness of left upper extremity in particular.  Patient has been noted using left upper extremities but when  instructed has to be cued.  Perseverative responses are noted and has to have considerable cueing to maintain focus and direction.  Reason for Service:  Patient was referred for neuropsychological consultation due to coping adjustment with residual effects primarily right frontal injury.  Below is the HPI for the current admission.  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  Per chart review independent prior to admission.  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  1 level home with level entry.  Presented 06/22/2020 with acute onset of left-sided weakness.  Cranial CT scan showed an area of hemorrhage within the anterior medial right frontal lobe.  Adjacent small amount of blood to the left side of the falx could be within the left frontal lobe or subarachnoid blood.  No mass-effect or midline shift.  CT angiogram of head and neck no sign of high flow vascular malformation in the region of the right frontal hemorrhage.  No signs of venous thrombosis.  MRI of the brain right frontal and smaller left frontal intraparenchymal hemorrhage likely small in size when comparing across modalities.  Small volume of adjacent hemorrhage along the falx and suspected trace right frontal subarachnoid hemorrhage.  No surrounding restricted diffusion to suggest hemorrhagic transformation or infarct.  No visible lesion noted.  Echocardiogram with ejection fraction of 60 to 65%  no wall motion abnormalities grade 2 diastolic dysfunction.  Neurology follow-up close monitoring of blood pressure.  Tolerating a regular consistency diet.  Admitted to CIR with left sided weakness, impaired mobility and ADLs, constipation, joint pain, and myalgias.    Current Status:  Upon entering the room, the patient was sitting in her bed in a slightly upright position focused on her cell phone.  The patient made very little direct eye contact during the interaction and continued to text or do other things on her phone throughout.  Attempts at cueing patient to turn focus away from phone were ineffective most of the time and the patient would immediately return back to focus of her phone.  She initiated no self-directed behaviors or self-directed questions and continued to fixate on her phone throughout.  She did perseverate on some issues during direct questions including how she had been seeing a doctor who told her there was nothing to worry about regarding vascular issues and that she been trying to take care of her self but had responsibility of helping out with her brother.  Patient's observed affect was very flat with no direct engagement from the patient without cueing and direct questioning.  Patient is expressive and receptive language appear to be intact and she was generally oriented but significant deficits consistent with right frontal involvement is identified as the primary location of her bleed with left frontal involvement also to a small degree.  Behavioral Observation: Cheryl Ortiz  presents as a 70 y.o.-year-old Right African American Female who appeared her stated age. her dress was Appropriate and she was Well Groomed and her manners were Appropriate, inappropriate to the situation.  her participation was indicative of Inattentive behaviors.  There were not physical disabilities noted.  she displayed an inappropriate level of cooperation and motivation.     Interactions:    Minimal Inattentive  Attention:   abnormal and attention span appeared shorter than expected for age  Memory:   abnormal; remote memory intact, recent memory impaired  Visuo-spatial:  not examined  Speech (Volume):  low  Speech:   normal; normal  Thought  Process:  Circumstantial  Though Content:  Rumination; not suicidal and not homicidal  Orientation:   person, place and situation  Judgment:   Poor  Planning:   Poor  Affect:    Flat and Lethargic  Mood:    Dysphoric  Insight:   Shallow  Intelligence:   normal  Medical History:   Past Medical History:  Diagnosis Date  . Aneurysm ([place]) 06/2015   brain-small will recheck in 1 year  . Cold   . Cystocele 07/12/2015  . Hyperlipidemia   . Hypertension   . Neuropathy of both feet    tingling/numbness bilateral feet ,rt arm,hands-nerve study scheduled  . Osteoarthritis   . Renal disorder    kidney stones  . Seasonal allergies   . Type 2 diabetes mellitus [facility])          Patient Active Problem List   Diagnosis Date Noted  . Labile blood pressure   . Left foot pain   . Attention and concentration deficit   . History of hypertension   . Hypotension due to drugs   . Labile blood glucose   . Hypokalemia   . Essential hypertension   . Controlled type 2 diabetes mellitus with hyperglycemia, without long-term current use of insulin ([place])   . ICH (intracerebral hemorrhage) ([place]) 06/23/2020  . Pelvic floor relaxation 03/07/2020  . Screening for  colorectal cancer 12/09/2018  . 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 Snoring 06/08/2018  . Poor sleep hygiene 06/08/2018  . Chronic insomnia 06/08/2018  . Encounter for well woman exam with routine gynecological exam 09/09/2017  . Female cystocele 09/09/2017  . Cystocele 07/12/2015  . Precordial pain 02/18/2013  . Type 2 diabetes mellitus ([place]) 02/18/2013  . Hyperlipidemia 02/17/2013  . Essential hypertension, benign 02/17/2013     Psychiatric History:  Patient without prior history of psychiatric illness or treatment.  Family Med/Psych History:  Family History  Problem Relation Age of Onset  . Hypertension Mother   . Arthritis Mother   . Cancer Mother        breast   . Cerebral palsy Brother   .  Early death Brother   . Hypertension Brother   . Heart disease Brother   . Pneumonia Father   . Hypertension Father   . Hyperlipidemia Sister   . Breast cancer Sister   . Fibroids Sister   . Hypertension Brother   . Heart attack Brother     Impression/DX:  Cheryl Ortiz is a 70 year old female with history of diabetes with peripheral neuropathy, hypertension, hyperlipidemia, kidney stones with stent placement 2017 and quitting smoking 22 years ago.  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  Patient presented on 06/22/2020 with acute onset of left-sided weakness.  Cranial CT scan showed an area of hemorrhage within the anterior medial right frontal lobe with adjacent small amount of blood in the left side within the left frontal lobe or subarachnoid blood.  MRI of the brain right frontal and smaller left frontal intraparenchymal hemorrhage likely small in size with comparing across imaging modalities.  Small volume hemorrhage suspected traits right frontal subarachnoid hemorrhage as well.  No surrounding restricted diffusion to suggest hemorrhagic transformation or infarct.  Patient has been on the unit for some time and has continued with slow progress including left-sided weakness and impaired executive functioning and significant attentional deficits and deficits with initiated behavior.  Patient has required max cueing for attention with reduced awareness of left upper extremity in particular.  Patient has been noted using left upper extremities but when instructed has to be cued.  Perseverative responses are noted and has to have considerable cueing to maintain focus and direction.  Upon entering the room, the patient was sitting in her bed in a slightly upright position focused on her cell phone.   The patient made very little direct eye contact during the interaction and continued to text or do other things on her phone throughout.  Attempts at cueing patient to turn focus away from phone were ineffective most of the time and the patient would immediately return back to focus of her phone.  She initiated no self-directed behaviors or self-directed questions and continued to fixate on her phone throughout.  She did perseverate on some issues during direct questions including how she had been seeing a doctor who told her there was nothing to worry about regarding vascular issues and that she been trying to take care of her self but had responsibility of helping out with her brother.  Patient's observed affect was very flat with no direct engagement from the patient without cueing and direct questioning.  Patient is expressive and receptive language  appear to be intact and she was generally oriented but significant deficits consistent with right frontal involvement is identified as the primary location of her bleed with left frontal involvement also to a small degree.  Disposition/Plan:  Clearly, there are significant attentional deficits consistent with her frontal lobe hemorrhage.  Patient is expressive and receptive language are intact but her behavioral changes include lack of self initiation and fixation/rumination on individual aspects including significant extreme fixation on her phone.  Today we worked on coping and adjustment issues but the patient is going to have great difficulty staying focused on PT/OT type tasks and will need constant cues to maintain focus.  Diagnosis:    Nontraumatic subcortical hemorrhage of left cerebral hemisphere [facility]) - Plan: Ambulatory referral to Neurology  Nontraumatic cortical hemorrhage of right cerebral hemisphere [facility]) - Plan: Ambulatory referral to Neurology         Electronically Signed   _______________________ Ilean Skill, Psy.D. Clinical  Neuropsychologist

## 2020-07-10 NOTE — Progress Notes (Signed)
Physical Therapy Session Note  Patient Details  Name: Cheryl Ortiz MRN: [id number] Date of Birth: 1950/05/27  Today's Date: 07/10/2020 PT Individual Time: 0901-0929 PT Individual Time Calculation (min): 28 min   Short Term Goals: Week 1:  PT Short Term Goal 1 (Week 1): Patient to perform supine to sit with min A after cueing PT Short Term Goal 2 (Week 1): Patient to perform bed<>chair transfers with min A with cues PT Short Term Goal 3 (Week 1): Patient to initiate stair training. PT Short Term Goal 4 (Week 1): Patient to propel w/c 61' with min A for L side awareness. Week 2:  PT Short Term Goal 1 (Week 2): pt will consistently complete bed mobility with modA PT Short Term Goal 2 (Week 2): Pt will complete bed<>chair transfers with modA and LRAD PT Short Term Goal 3 (Week 2): Pt will ambulate 132ft with minA and LRAD PT Short Term Goal 4 (Week 2): Pt will initiate stair training  Skilled Therapeutic Interventions/Progress Updates:   Received pt supine in bed with RN present administering medications, pt agreeable to therapy, and reported mild pain on top of L foot. Pt distracted scrolling on phone and required max cues for attention throughout session. Session with emphasis on functional mobility/transfers, generalized strengthening, dynamic standing balance/coordination, ambulation, L attention, toileting, and improved activity tolerance. Donned bilateral ted hose and shoes with total A for time management purposes and pt transferred supine<>sitting EOB with HOB elevated with supervision/CGA and ambulated 286ft with RW and CGA. Pt ambulated with decreased cadence, decreased bilateral foot clearance, L inattention, and narrow BOS. Emphasis on L attention and visual scanning. Pt able to identify objects on L side of hallway inclusing chairs, people, computers, and hand sanitizer stations as well as reading signs on L side of hallway. Pt reported urge to use restroom and required mod A for  clothing management. Pt able void and with small amounts of loose stool (NT aware). Pt required total A for peri-care in standing. Pt ambulated 22ft with RW and CGA to bed and transferred sit<>supine with mod A for BLE management. Scooted to [facility] with +2 assist due to time restrictions. Concluded session with pt semi-reclined in bed, needs within reach, and bed alarm on. NT present at bedside.   Therapy Documentation Precautions:  Precautions Precautions: Fall Restrictions Weight Bearing Restrictions: No  Therapy/Group: Individual Therapy Alfonse Alpers PT, DPT   07/10/2020, 7:18 AM

## 2020-07-10 NOTE — Progress Notes (Signed)
Speech Language Pathology Daily Session Note  Patient Details  Name: Cheryl Ortiz MRN: [id number] Date of Birth: 1951/01/26  Today's Date: 07/10/2020 SLP Individual Time: 0805-0900 SLP Individual Time Calculation (min): 55 min  Short Term Goals: Week 2: SLP Short Term Goal 1 (Week 2): STG=LTG due to remaining length of stay  Skilled Therapeutic Interventions:Skilled ST services focused on cognitive skills. Pt demonstrated reduced anticipatory and safety awareness stating at home she could ambulate to the restroom from bed, via future surfing, but after short discussion was agreeable for assistance from daughter upon d/c. Pt demonstrated increase intellectual awareness naming deficits with left side weakness and impaired thinking/vocal skills especially in areas of attention and reduced vocal intensity. SLP facilitated biofeedback, pt recorded simple conversation on phone and upon replaying noted "this is not my voice." Pt required mod A verbal cues to increase vocal intensity throughout session with no attempts to self-correct. SLP administered SLUMS to reassess cognitive skills, pt scored 23 out 30 (n=>27) with continued deficits noted in problem solving, attention and memory. During the session pt was continuously distracted by her phone and required mod A verbal cues for problem solving with attempting to locate a contact in her text messages. Pt was left in room with call bell within reach and bed alarm set. SLP recommends to continue skilled services.  Pain Pain Assessment Pain Score: 0-No pain  Therapy/Group: Individual Therapy  Alyxis Grippi  [facility] 07/10/2020, 5:19 PM

## 2020-07-10 NOTE — Progress Notes (Signed)
[place] PHYSICAL MEDICINE & REHABILITATION PROGRESS NOTE  Subjective/Complaints: Patient seen sitting up in bed this morning.  She states she slept well overnight.  She is using her left upper extremity to eat.  ROS: Denies CP, SOB, N/V/D  Objective: Vital Signs: Blood pressure 115/60, pulse 78, temperature 98 F (36.7 C), temperature source Oral, resp. rate 18, height 5\' 6"  (1.676 m), weight 101.8 kg, SpO2 100 %. No results found. No results for input(s): WBC, HGB, HCT, PLT in the last 72 hours. No results for input(s): NA, K, CL, CO2, GLUCOSE, BUN, CREATININE, CALCIUM in the last 72 hours.  Intake/Output Summary (Last 24 hours) at 07/10/2020 7322 Last data filed at 07/09/2020 1700 Gross per 24 hour  Intake 90 ml  Output -  Net 90 ml        Physical Exam: BP 115/60 (BP Location: Left Arm)   Pulse 78   Temp 98 F (36.7 C) (Oral)   Resp 18   Ht 5\' 6"  (1.676 m)   Wt 101.8 kg   SpO2 100%   BMI 36.22 kg/m  Constitutional: No distress . Vital signs reviewed. HENT: Normocephalic.  Atraumatic. Eyes: EOMI. No discharge. Cardiovascular: No JVD.  RRR. Respiratory: Normal effort.  No stridor.  Bilateral clear to auscultation. GI: Non-distended.  BS +. Skin: Warm and dry.  Intact. Psych: Slowed, improving.  Normal behavior. Musc: No edema in extremities.  No tenderness in extremities. Neuro: Alert Follows commands.  Improving insight and awareness Motor: Left upper extremity: 4/5 proximal distal Left lower extremity: Hip flexion, knee extension 4-/5, ankle dorsiflexion 3+/5   Assessment/Plan: 1. Functional deficits which require 3+ hours per day of interdisciplinary therapy in a comprehensive inpatient rehab setting.  Physiatrist is providing close team supervision and 24 hour management of active medical problems listed below.  Physiatrist and rehab team continue to assess barriers to discharge/monitor patient progress toward functional and medical goals   Care  Tool:  Bathing    Body parts bathed by patient: Left arm,Chest,Abdomen,Front perineal area,Buttocks,Right upper leg,Left upper leg,Face,Right lower leg,Right arm   Body parts bathed by helper: Left lower leg     Bathing assist Assist Level: Minimal Assistance - Patient > 75%     Upper Body Dressing/Undressing Upper body dressing   What is the patient wearing?: Pull over shirt,Bra    Upper body assist Assist Level: Moderate Assistance - Patient 50 - 74%    Lower Body Dressing/Undressing Lower body dressing      What is the patient wearing?: Pants,Incontinence brief     Lower body assist Assist for lower body dressing: Maximal Assistance - Patient 25 - 49%     Toileting Toileting    Toileting assist Assist for toileting: Moderate Assistance - Patient 50 - 74%     Transfers Chair/bed transfer  Transfers assist     Chair/bed transfer assist level: Minimal Assistance - Patient > 75%     Locomotion Ambulation   Ambulation assist      Assist level: Minimal Assistance - Patient > 75% Assistive device: Walker-rolling Max distance: >60 ft   Walk 10 feet activity   Assist     Assist level: Minimal Assistance - Patient > 75% Assistive device: Walker-rolling   Walk 50 feet activity   Assist    Assist level: Minimal Assistance - Patient > 75% Assistive device: Walker-rolling    Walk 150 feet activity   Assist Walk 150 feet activity did not occur: Safety/medical concerns  Assist level: Minimal Assistance -  Patient > 75% Assistive device: Walker-rolling    Walk 10 feet on uneven surface  activity   Assist Walk 10 feet on uneven surfaces activity did not occur: Safety/medical concerns   Assist level: Minimal Assistance - Patient > 75% Assistive device: Aeronautical engineer Will patient use wheelchair at discharge?: No      Wheelchair assist level: Dependent - Patient 0% Max wheelchair distance: 200    Wheelchair  50 feet with 2 turns activity    Assist        Assist Level: Dependent - Patient 0%   Wheelchair 150 feet activity     Assist      Assist Level: Dependent - Patient 0%   Medical Problem List and Plan: 1.  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  Continue CIR  Repeat stat CT showing unchanged size of hematoma.  No changes per neurology  -appears to be improving cognitively  2.  Antithrombotics: -DVT/anticoagulation: SCDs             -antiplatelet therapy: N/A 3. Pain Management: Continue Tylenol as needed  Chronic left foot pain:   Does not appear to limit therapies   Voltaren gel to left ankle    Lidoderm patch ordered for left foot, with some improvement  Appears controlled with meds on 2/22 4. Mood: Provide emotional support             -antipsychotic agents: N/A 5. Neuropsych: This patient is not fully capable of making decisions on her own behalf.  Ritalin increased to 5 mg twice daily 2/12 with improvement 6. Skin/Wound Care: Routine skin checks 7. Fluids/Electrolytes/Nutrition: Routine in and outs 8.  Diabetes mellitus type II with hyperglycemia.  Hemoglobin A1c 6.2.  Continue Glucophage 1000 mg daily.     CBG (last 3)  Recent Labs    07/09/20 0537 07/09/20 1107 07/09/20 1158  GLUCAP 120* 117* 89   Relatively controlled on 2/21- continue to monitor 9.  Hypertension.   HCTZ 25 mg daily, decreased to 12.5 on 2/10, DC'd on 2/15  Relatively soft on 2/22 10.  History of kidney stones with stent placement 2017 11. Obesity (BMI 36.48): Provide dietary counseling 12.  Hypokalemia  Potassium 3.7 on 2/18  Supplemented x2 days on 2/11-2/12  Continue to monitor  LOS: 15 days A FACE TO FACE EVALUATION WAS PERFORMED  Ankit Lorie Phenix 07/10/2020, 9:22 AM

## 2020-07-10 NOTE — Patient Care Conference (Incomplete)
Inpatient RehabilitationTeam Conference and Plan of Care Update Date: 07/10/2020   Time: 2:14 AM    Patient Name: Cheryl Ortiz      Medical Record Number: [id number]  Date of Birth: [birth year] Sex: Female         Room/Bed: 5C07C/5C07C-01 Payor Info: Payor: MEDICARE / Plan: MEDICARE PART A AND B / Product Type: *No Product type* /    Admit Date/Time:  06/25/2020  3:55 PM  Primary Diagnosis:  ICH (intracerebral hemorrhage) [facility])  Hospital Problems: Principal Problem:   ICH (intracerebral hemorrhage) ([place]) Active Problems:   Hypokalemia   Essential hypertension   Controlled type 2 diabetes mellitus with hyperglycemia, without long-term current use of insulin ([place])   History of hypertension   Hypotension due to drugs   Labile blood glucose   Attention and concentration deficit   Left foot pain   Labile blood pressure    Expected Discharge Date: Expected Discharge Date: 07/19/20  Team Members Present:       Current Status/Progress Goal Weekly Team Focus  Bowel/Bladder     Pt currently continent of B/B. LBM 07/09/20   Remain continent of B/B     Swallow/Nutrition/ Hydration             ADL's             Mobility             Communication             Safety/Cognition/ Behavioral Observations            Pain    Pain in LLE and R-knee. Pain is managed with Voltaren gel, Lidoderm patch and PRN medication.   Continue pain control regiment      Skin     No issues skin intact   skin care and remains intact        Discharge Planning:      Team Discussion: *** Patient on target to meet rehab goals: {IP REHAB YES/NO WITH [id number]}  *See Care Plan and progress notes for long and short-term goals.   Revisions to Treatment Plan:  ***  Teaching Needs: ***  Current Barriers to Discharge: {BARRIERS TO MCRFVOHKG:67703}  Possible Resolutions to Barriers: ***     Medical Summary               I attest that I was present, lead the team  conference, and concur with the assessment and plan of the team.   Beverley Fiedler 07/10/2020, 2:14 AM

## 2020-07-10 NOTE — Progress Notes (Signed)
Occupational Therapy Session Note  Patient Details  Name: Cheryl Ortiz MRN: [id number] Date of Birth: 1951-01-09  Today's Date: 07/10/2020 OT Individual Time: [phone] and [id number] OT Individual Time Calculation (min): 26 min and 57 min   Short Term Goals: Week 2:  OT Short Term Goal 1 (Week 2): Pt will demonstrate improved L side awareness when stepping into the shower only needing 1 verbal cue to fully step to her L.. OT Short Term Goal 2 (Week 2): Pt will don pants with mod A or less. OT Short Term Goal 3 (Week 2): Pt will don a shirt with verbal cues only. OT Short Term Goal 4 (Week 2): Pt will complete toilet transfer with min A during a stand pivot.  Skilled Therapeutic Interventions/Progress Updates:    1) Treatment session with focus on attention, functional use of LUE, and self-feeding.  Pt received upright in bed with lunch tray in front of pt.  Pt had not yet begun eating.  Therapist encouraged pt to set phone aside to engage in self-feeding.  Pt able to utilize LUE to bring sandwich to mouth with cues to focus attention on food.  Pt continuously picking up phone to check messages and attempted calling same person x3.  Pt demonstrating decreased attention to L with inability to locate soup initially, requiring cues to visually scan table top.  Pt extremely internally distracted this session.  Pt was able to eat half of sandwich during therapy session with mod cues for attention.    2) Treatment session with focus on functional mobility, sit > stand, and standing tolerance during self-care tasks of toileting.  Pt received upright in bed on phone.  Pt did hang up phone upon therapist arrival, however then began changing the channels on the tv.  Therapist providing cues to turn off tv, with pt agreeable but then still changing the channels.  Once therapist turned off tv pt able to participate better.  Pt completed bed mobility with min assist and max cues for attention and  completion of task.  Pt required increased time for sit > stand from at EOB, ultimately requiring min assist and cues for initiation.  Pt ambulated to [facility] over toilet with RW and CGA.  Pt completed clothing management prior to sitting on toilet, noted to have been incontinent of bowel.  Pt completed toileting with increased time and minimal output.  Pt then stood with CGA and completed hygiene post toileting with increased time due to perseveration.  Pt ambulated to bed with RW with CGA.  Completed hygiene while supine in bed for thoroughness.  Pt required assistance with donning pants and shoes this session due to time constraints and decreased initiation.  Pt remained upright in bed with all needs in reach.  RN present to administer meds.  Therapy Documentation Precautions:  Precautions Precautions: Fall Restrictions Weight Bearing Restrictions: No General:   Vital Signs: Therapy Vitals Temp: (!) 97.5 F (36.4 C) Temp Source: Oral Pulse Rate: 84 Resp: 16 BP: 119/60 Patient Position (if appropriate): Lying Oxygen Therapy SpO2: 96 % O2 Device: Room Air Pain:  Pt with no c/o pain   Therapy/Group: Individual Therapy  Simonne Come 07/10/2020, 3:31 PM

## 2020-07-11 LAB — GLUCOSE, CAPILLARY
Glucose-Capillary: 72 mg/dL (ref 70–99)
Glucose-Capillary: 74 mg/dL (ref 70–99)
Glucose-Capillary: 73 mg/dL (ref 70–99)
Glucose-Capillary: 83 mg/dL (ref 70–99)

## 2020-07-11 NOTE — Progress Notes (Signed)
Physical Therapy Weekly Progress Note  Patient Details  Name: Cheryl Ortiz MRN: [id number] Date of Birth: Feb 22, 1951  Beginning of progress report period: June 26, 2020 End of progress report period: July 11, 2020  Today's Date: 07/11/2020 PT Individual Time: 6433-2951 PT Individual Time Calculation (min): 54 min   Patient has met 4 of 4 short term goals. Pt demonstrates gradual progress towards long term goals. Pt currently requires min/mod A for bed mobility, CGA for transfers with RW, CGA/min A to ambulate >158f with RW, and min A to navigate 8 steps with 2 handrails. Pt continues to be limited by L inattention, decreased initiation and awareness, generalized weakness, decreased balance, tendency to perseverate on certain tasks (especially toileting), and is easily distracted by electronics including her phone and the television.   Patient continues to demonstrate the following deficits muscle weakness, impaired timing and sequencing, decreased coordination and decreased motor planning, decreased attention to left and decreased motor planning, decreased attention, decreased awareness, decreased problem solving and decreased safety awareness and decreased standing balance, decreased postural control and decreased balance strategies and therefore will continue to benefit from skilled PT intervention to increase functional independence with mobility.  Patient progressing toward long term goals..  Continue plan of care.  PT Short Term Goals Week 2:  PT Short Term Goal 1 (Week 2): pt will consistently complete bed mobility with modA PT Short Term Goal 1 - Progress (Week 2): Met PT Short Term Goal 2 (Week 2): Pt will complete bed<>chair transfers with modA and LRAD PT Short Term Goal 2 - Progress (Week 2): Met PT Short Term Goal 3 (Week 2): Pt will ambulate 1570fwith minA and LRAD PT Short Term Goal 3 - Progress (Week 2): Met PT Short Term Goal 4 (Week 2): Pt will initiate stair  training PT Short Term Goal 4 - Progress (Week 2): Met Week 3:  PT Short Term Goal 1 (Week 3): STG=LTG due to LOS  Skilled Therapeutic Interventions/Progress Updates:  Ambulation/gait training;DME/adaptive equipment instruction;Neuromuscular re-education;Stair training;UE/LE Strength taining/ROM;Wheelchair propulsion/positioning;UE/LE Coordination activities;Therapeutic Activities;Balance/vestibular training;Discharge planning;Cognitive remediation/compensation;Disease management/prevention;Functional mobility training;Patient/family education;Therapeutic Exercise;Splinting/orthotics;Visual/perceptual remediation/compensation   Today's Interventions: Received pt sitting in recliner finishing lunch, pt agreeable to therapy, and denied any pain during session. Session with emphasis on functional mobility/transfers, toileting, generalized strengthening, dynamic standing balance/coordination, ambulation, NMR, motor sequencing, L attention and awareness, and improved activity tolerance. Pt reported her brief was wet and she needed to change it. Pt ambulated 1256fith RW and CGA to bathroom and requested to don gloves. Donned gloves with CGA in standing and pt required mod A to doff pants/brief due to difficulty sequencing and sustaining attention. Noted pt's brief completely dry. Pt initially did not void but began wiping while standing then returned to sitting on commode and was able to void. Pt perseverated on pulling out numerous wipes while sitting on commode. Pt ambulated to sink with RW and CGA and stood and washed hands with CGA for balance. Pt transported to 68M in WC total A for time management purposes and navigated 8 steps with 2 rails and min A ascending and descending with a step to pattern. Pt demonstrated SOB after activity requiring extensive rest break. Pt transported to dayroom and transferred sit<>stand without AD and CGA x 2 trials and worked on dynamic standing balance, L attention, awareness,  and visual scanning creating pictures on pegboard x 2 trials. Trial 1: pt with 100% accuracy and Trial 2: pt with 65% accuracy and required mod verbal cues  for visual scanning and pattern sequencing. Pt transported back to room in [place] total A and requested to return to bed. Pt ambulated 83f without AD and CGA and transferred sit<>supine with CGA and mod cues for LE management and midline positioning in bed as pt initially lying there crooked and distracted by television. Doffed shoes with total A and [facility] and [facility] cues. Concluded session with pt supine in bed, needs within reach, and bed alarm on.   Therapy Documentation Precautions:  Precautions Precautions: Fall Restrictions Weight Bearing Restrictions: No  Therapy/Group: Individual Therapy AAlfonse AlpersPT, DPT   07/11/2020, 7:37 AM

## 2020-07-11 NOTE — Plan of Care (Signed)
  Problem: RH Expression Communication Goal: LTG Patient will increase speech intelligibility (SLP) Description: LTG: Patient will increase speech intelligibility at word/phrase/conversation level with cues, % of the time (SLP) Flowsheets Taken 07/11/2020 1110 by Charolett Bumpers, CCC-SLP LTG: Patient will increase speech intelligibility (SLP): (downgraded due to limited carryover) Minimal Assistance - Patient > 75% Percent of time patient will use intelligible speech: 90 Taken 06/26/2020 1254 by Arbutus Leas, CCC-SLP Level: Conversation level Note: Downgrade due to limited carryover

## 2020-07-11 NOTE — Progress Notes (Signed)
Patient ID: Cheryl Ortiz, female   DOB: [birth year], 70 y.o.   MRN: [id number]  Spoke with pt and daughter to update regarding team conference progress and her need for hands on care. Scheduled family training for Tuesday 3/1 @ 10-12 with daughter. Continue to work on discharge needs.

## 2020-07-11 NOTE — Patient Care Conference (Signed)
Inpatient RehabilitationTeam Conference and Plan of Care Update Date: 07/11/2020   Time: 11:04 AM    Patient Name: Cheryl Ortiz      Medical Record Number: [id number]  Date of Birth: 02-Mar-1951 Sex: Female         Room/Bed: 5C07C/5C07C-01 Payor Info: Payor: MEDICARE / Plan: MEDICARE PART A AND B / Product Type: *No Product type* /    Admit Date/Time:  06/25/2020  3:55 PM  Primary Diagnosis:  ICH (intracerebral hemorrhage) [facility])  Hospital Problems: Principal Problem:   ICH (intracerebral hemorrhage) ([place]) Active Problems:   Hypokalemia   Essential hypertension   Controlled type 2 diabetes mellitus with hyperglycemia, without long-term current use of insulin ([place])   History of hypertension   Hypotension due to drugs   Labile blood glucose   Attention and concentration deficit   Left foot pain   Labile blood pressure    Expected Discharge Date: Expected Discharge Date: 07/19/20  Team Members Present: Physician leading conference: Dr. Delice Lesch Care Coodinator Present: Dorien Chihuahua, RN, BSN, CRRN;Becky Dupree, LCSW Nurse Present: Suella Grove, RN PT Present: Ginnie Smart, PT OT Present: Meriel Pica, OT SLP Present: Charolett Bumpers, SLP PPS Coordinator present : Gunnar Fusi, SLP     Current Status/Progress Goal Weekly Team Focus  Bowel/Bladder   Pt currently continent of B/B. LBM 07/09/20  Remain continent of B/B  toliet q2h or PRN   Swallow/Nutrition/ Hydration             ADL's   very slow processing, perseverative behavior, needs min A overall with mod to max cues  supervision; may need to downgrade due to slow progress  ADL training, functional mobility, balance, L side awareness, pt/family education   Mobility   minA bed mobility, minA stand<>pivot transfers, gait ~151ft with CGA and RW  Downgraded goals to CGA/minA  L attention, INITIATION, functional transfers, bed mobility, gait training, will need to begin stair training   Communication   Mod A,  pragmatic communciation deficits  Min A - downgraded 2/23  increasing vocal intensity and pragmatics, self monitoring, education   Safety/Cognition/ Behavioral Observations  Mod A, Max A correction of errors, overall poor carryover of startegies due to awareness  Min A, Mod A emergent and selective attention  education, sustained/selective attention (internal distractions) emergent awareness and problem solving   Pain   Pain in LLE and R-knee. Pain is managed with Voltaren gel, Lidoderm patch and PRN medication.  Remain pain free  Continue pain control regiment   Skin   No issues skin intact  skin care and remains intact  Daily skin care and PRN     Discharge Planning:  Daughter has been here and plans on takng FMLA for 24/7 care. Will need family education prior to discharge home   Team Discussion: Hypokalemia treated with supplement and ritalin continued. Remains incontinent of bowel even with imodium. Complains of left ankle pain even with lidocaine patch and Voltaren gel. Progress limited by delayed processing, poor attention,and perseveration. Patient on target to meet rehab goals: no, currently able to complete ADLs with CGA-Min assist and ambulate 150' with min assist-CGA.  *See Care Plan and progress notes for long and short-term goals.   Revisions to Treatment Plan:  Downgraded SLP goals for attention, emergent awareness and sustained attention. Teaching Needs: Safety, toileting, redirection, medications, secondary stroke risk management and DM management, etc.  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  Barriers to Discharge: Behavior;Medical stability;Weight;Incontinence    Possible Resolutions to [employer] Focus: Therapies, optimize DM/BP meds, follow labs - K+, optimize pain meds. optimize bowel meds   Continued Need for Acute Rehabilitation Level of Care: The patient requires daily medical management by a physician with specialized training in physical medicine and rehabilitation for the following reasons: Direction of a multidisciplinary physical rehabilitation program to maximize functional independence : Yes Medical management of patient stability for increased activity during participation in an intensive rehabilitation regime.: Yes Analysis of laboratory values and/or radiology reports with any subsequent need for medication adjustment and/or medical intervention. : Yes   I attest that I was present, lead the team conference, and concur with the assessment and plan of the team.   Dorien Chihuahua B 07/11/2020, 2:55 PM

## 2020-07-11 NOTE — Progress Notes (Signed)
[place] PHYSICAL MEDICINE & REHABILITATION PROGRESS NOTE  Subjective/Complaints: Patient seen sitting up this morning.  She states she slept well overnight.  She notes that she is trying to get better.  She states she needs a shower chair so that she can bathe with OT.  ROS: Denies CP, SOB, N/V/D  Objective: Vital Signs: Blood pressure 114/66, pulse 66, temperature (!) 97.5 F (36.4 C), temperature source Oral, resp. rate 18, height 5\' 6"  (1.676 m), weight 101.8 kg, SpO2 96 %. No results found. No results for input(s): WBC, HGB, HCT, PLT in the last 72 hours. No results for input(s): NA, K, CL, CO2, GLUCOSE, BUN, CREATININE, CALCIUM in the last 72 hours.  Intake/Output Summary (Last 24 hours) at 07/11/2020 1029 Last data filed at 07/11/2020 0700 Gross per 24 hour  Intake 420 ml  Output --  Net 420 ml        Physical Exam: BP 114/66 (BP Location: Left Arm)   Pulse 66   Temp (!) 97.5 F (36.4 C) (Oral)   Resp 18   Ht 5\' 6"  (1.676 m)   Wt 101.8 kg   SpO2 96%   BMI 36.22 kg/m  Constitutional: No distress . Vital signs reviewed. HENT: Normocephalic.  Atraumatic. Eyes: EOMI. No discharge. Cardiovascular: No JVD.  RRR. Respiratory: Normal effort.  No stridor.  Bilateral clear to auscultation. GI: Non-distended.  BS +. Skin: Warm and dry.  Intact. Psych: Processing improving.  Normal behavior. Musc: No edema in extremities.  No tenderness in extremities. Neuro: Alert Follows commands.  Improving insight and awareness Motor: Left upper extremity: 4/5 proximal distal, stable Left lower extremity: Hip flexion, knee extension 4-/5, ankle dorsiflexion 3+/5   Assessment/Plan: 1. Functional deficits which require 3+ hours per day of interdisciplinary therapy in a comprehensive inpatient rehab setting.  Physiatrist is providing close team supervision and 24 hour management of active medical problems listed below.  Physiatrist and rehab team continue to assess barriers to  discharge/monitor patient progress toward functional and medical goals   Care Tool:  Bathing    Body parts bathed by patient: Left arm,Chest,Abdomen,Front perineal area,Buttocks,Right upper leg,Left upper leg,Face,Right lower leg,Right arm   Body parts bathed by helper: Left lower leg     Bathing assist Assist Level: Minimal Assistance - Patient > 75%     Upper Body Dressing/Undressing Upper body dressing   What is the patient wearing?: Pull over shirt,Bra    Upper body assist Assist Level: Moderate Assistance - Patient 50 - 74%    Lower Body Dressing/Undressing Lower body dressing      What is the patient wearing?: Pants,Incontinence brief     Lower body assist Assist for lower body dressing: Maximal Assistance - Patient 25 - 49%     Toileting Toileting    Toileting assist Assist for toileting: Moderate Assistance - Patient 50 - 74%     Transfers Chair/bed transfer  Transfers assist     Chair/bed transfer assist level: Minimal Assistance - Patient > 75%     Locomotion Ambulation   Ambulation assist      Assist level: Minimal Assistance - Patient > 75% Assistive device: Walker-rolling Max distance: >237ft   Walk 10 feet activity   Assist     Assist level: Minimal Assistance - Patient > 75% Assistive device: Walker-rolling   Walk 50 feet activity   Assist    Assist level: Minimal Assistance - Patient > 75% Assistive device: Walker-rolling    Walk 150 feet activity  Assist Walk 150 feet activity did not occur: Safety/medical concerns  Assist level: Minimal Assistance - Patient > 75% Assistive device: Walker-rolling    Walk 10 feet on uneven surface  activity   Assist Walk 10 feet on uneven surfaces activity did not occur: Safety/medical concerns   Assist level: Minimal Assistance - Patient > 75% Assistive device: Aeronautical engineer Will patient use wheelchair at discharge?: No      Wheelchair  assist level: Dependent - Patient 0% Max wheelchair distance: 200    Wheelchair 50 feet with 2 turns activity    Assist        Assist Level: Dependent - Patient 0%   Wheelchair 150 feet activity     Assist      Assist Level: Dependent - Patient 0%   Medical Problem List and Plan: 1.  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  Continue CIR  Repeat stat CT showing unchanged size of hematoma.  No changes per neurology  Team conference today to discuss current and goals and coordination of care, home and environmental barriers, and discharge planning with nursing, case manager, and therapies. Please see conference note from today as well.   2.  Antithrombotics: -DVT/anticoagulation: SCDs             -antiplatelet therapy: N/A 3. Pain Management: Continue Tylenol as needed  Chronic left foot pain:   Does not appear to limit therapies   Voltaren gel to left ankle    Lidoderm patch ordered for left foot, with some improvement  Appears controlled with meds on 2/23 4. Mood: Provide emotional support             -antipsychotic agents: N/A 5. Neuropsych: This patient is not fully capable of making decisions on her own behalf.  Ritalin increased to 5 mg twice daily 2/12 with improvement 6. Skin/Wound Care: Routine skin checks 7. Fluids/Electrolytes/Nutrition: Routine in and outs 8.  Diabetes mellitus type II with hyperglycemia.  Hemoglobin A1c 6.2.  Continue Glucophage 1000 mg daily.     CBG (last 3)  Recent Labs    07/10/20 1635 07/10/20 2050 07/11/20 0559  GLUCAP 88 90 72   Controlled on 2/23 9.  Hypertension.   HCTZ 25 mg daily, decreased to 12.5 on 2/10, DC'd on 2/15  Relatively controlled on 2/23 10.  History of kidney stones with stent placement 2017 11. Obesity (BMI 36.48): Provide dietary counseling 12.   Hypokalemia  Potassium 3.7 on 2/18, labs ordered for tomorrow  Supplemented x2 days on 2/11-2/12  Continue to monitor  LOS: 16 days A FACE TO FACE EVALUATION WAS PERFORMED  Cheryl Ortiz Cheryl Ortiz 07/11/2020, 10:29 AM

## 2020-07-11 NOTE — Progress Notes (Signed)
Occupational Therapy Session Note  Patient Details  Name: Cheryl Ortiz MRN: [id number] Date of Birth: 1950-12-03  Today's Date: 07/11/2020 OT Individual Time: 1104-1200 OT Individual Time Calculation (min): 56 min    Short Term Goals: Week 2:  OT Short Term Goal 1 (Week 2): Pt will demonstrate improved L side awareness when stepping into the shower only needing 1 verbal cue to fully step to her L.. OT Short Term Goal 2 (Week 2): Pt will don pants with mod A or less. OT Short Term Goal 3 (Week 2): Pt will don a shirt with verbal cues only. OT Short Term Goal 4 (Week 2): Pt will complete toilet transfer with min A during a stand pivot.  Skilled Therapeutic Interventions/Progress Updates:    Treatment session with focus on L attention, dynamic standing balance, and functional transfers.  Pt received semi-reclined in bed texting on phone.  Pt required max encouragement to set down phone to engage in therapy session.  Pt completed bed mobility with CGA to come to sitting EOB.  Pt donned R shoe with increased time, but required assist to don L shoe.  Engaged in Connect 4 activity in standing with focus on standing balance and L attention.  Pt requiring max cues to attend to L of table to obtain pieces and attend to entire activity.  Pt with frequent inattention to L.  Pt reports need to toilet and began ambulating to bathroom despite not having RW.  Pt ambulated with CGA and turned to sit on toilet with min cues for sequencing.  Pt completed hygiene with CGA while standing, demonstrating perseveration with use of wipes.  Pt required assistance to pull pants over hips due to tight waistband.  Pt ambulated to sink with RW with CGA and completed hand hygiene while standing.  Pt ambulated to recliner with RW.  Engaged in pattern replication with pt demonstrating decreased attention to L requiring mod cues for attention and completion of task.  Pt continues to be internally and externally distracted  throughout session, requiring mod-max cues for attention to task. Pt remained in recliner with seat belt alarm on and all needs in reach.  Therapy Documentation Precautions:  Precautions Precautions: Fall Restrictions Weight Bearing Restrictions: No Pain:  Pt with no c/o pain   Therapy/Group: Individual Therapy  Simonne Come 07/11/2020, 12:34 PM

## 2020-07-11 NOTE — Progress Notes (Signed)
Speech Language Pathology Weekly Progress and Session Note  Patient Details  Name: Cheryl Ortiz MRN: [id number] Date of Birth: [birth year]  Beginning of progress report period: July 05, 2020 End of progress report period: July 11, 2020  Today's Date: 07/11/2020 SLP Individual Time: 0917-0959 SLP Individual Time Calculation (min): 42 min  Short Term Goals: Week 2: SLP Short Term Goal 1 (Week 2): STG=LTG due to remaining length of stay SLP Short Term Goal 1 - Progress (Week 2): Not met    New Short Term Goals: Week 3: SLP Short Term Goal 1 (Week 3): STG=LTG due to extend length of stay (execpted discharge 3/3)  Weekly Progress Updates: Pt made little progress this reporting period primarly due to lack of insight/impact of deficits. Pt's length of stay is extended to 3/3 due to slow progress and LTG goals have been downgraded to min-mod A. Pt has demonstrated increase in initiation, recall and problem solving skills, however attention and awareness continue to be her primary deficits. Pt would continue to benefit from skilled ST services in order to maximize functional independence and reduce burden of care, requiring supervision at discharge with continued skilled ST services.       Intensity: Minumum of 1-2 x/day, 30 to 90 minutes Frequency: 3 to 5 out of 7 days Duration/Length of Stay: 07/19/2020 Treatment/Interventions: Cognitive remediation/compensation;Cueing hierarchy;Functional tasks;Internal/external aids;Speech/Language facilitation;Patient/family education;Therapeutic Activities   Daily Session  Skilled Therapeutic Interventions: Skilled ST services focused on cognitive skills. Pt demonstrated poor pragmatic skills constantly answering phone during ST session and despite request to end conversation pt continued, indicting limited insight into deficits and need for services. Pt even mention ST requesting her to speak louder in conversation, however did not increase  vocal intensity in conversation maintaining monotone low vocal intensity. Pt eventually allowed SLP to remove phone from visual field to limit distraction. SLP facilitated mildly complex problem solving, attention and recall within task utilizing PEG design task. Pt demonstrated basic problem solving skills and ability to monitor/correct errors within it given supervision A verbal cues, however as the task increased in complexity pt required min A verbal cues for error awareness and max A verbal to correct errors. Pt demonstrated impairments in initiation however when questions attributed it to internal distractions. Pt expressed " I am use to multitasking." SLP provided education pertaining to deficits in attention and levels of attention, with the goal now of sustained attention to task and limiting distractions. Pt agreed. SLP downgraded goals due to slow progress, impairment attention and insight into the impact of acute deficits. Pt was left in room with call bell within reach and bed alarm set. SLP recommends to continue skilled services.    General    Pain Pain Assessment Pain Score: 0-No pain  Therapy/Group: Individual Therapy  Roye Gustafson  [facility] 07/11/2020, 4:54 PM

## 2020-07-12 LAB — BASIC METABOLIC PANEL
Anion gap: 14 (ref 5–15)
BUN: 15 mg/dL (ref 8–23)
CO2: 19 mmol/L — ABNORMAL LOW (ref 22–32)
Calcium: 9.5 mg/dL (ref 8.9–10.3)
Chloride: 105 mmol/L (ref 98–111)
Creatinine, Ser: 0.83 mg/dL (ref 0.44–1.00)
GFR, Estimated: >60 mL/min (ref >60)
Glucose, Bld: 96 mg/dL (ref 70–99)
Potassium: 3.5 mmol/L (ref 3.5–5.1)
Sodium: 138 mmol/L (ref 135–145)

## 2020-07-12 LAB — GLUCOSE, CAPILLARY
Glucose-Capillary: 88 mg/dL (ref 70–99)
Glucose-Capillary: 86 mg/dL (ref 70–99)
Glucose-Capillary: 75 mg/dL (ref 70–99)
Glucose-Capillary: 85 mg/dL (ref 70–99)

## 2020-07-12 NOTE — Progress Notes (Signed)
Speech Language Pathology Daily Session Note  Patient Details  Name: Cheryl Ortiz MRN: [id number] Date of Birth: 23-Dec-1950  Today's Date: 07/12/2020 SLP Individual Time: 0805-0905 SLP Individual Time Calculation (min): 60 min  Short Term Goals: Week 3: SLP Short Term Goal 1 (Week 3): STG=LTG due to extend length of stay (execpted discharge 3/3)  Skilled Therapeutic Interventions:   Patient seen for skilled ST session focusing on cognitive function goals. Patient in bed and agreeable to session. SLP instructed patient to verbalize her needs and steps to transfer out of bed and while using walker to ambulate to bathroom. She was able to accurately describe needs and steps but required min-mod cues to initiate. She demonstrated good safety awareness overall during ambulation with walker and transfers. Vocal intensity was mildly improved but patient continues with monotone voice. During unstructured conversation, she would quickly become tangential and continues to not monitor, does not demonstrate awareness to listener's interest and so conversation is very one-sided. Patient continues to benefit from skilled SLP intervention to maximize cognitive-linguistic function prior to discharge.   Pain Pain Assessment Pain Scale: 0-10 Pain Score: 0-No pain Faces Pain Scale: No hurt Pain Type: Acute pain Pain Location: Ankle  Therapy/Group: Individual Therapy  Sonia Baller, MA, CCC-SLP Speech Therapy

## 2020-07-12 NOTE — Progress Notes (Signed)
Physical Therapy Session Note  Patient Details  Name: Cheryl Ortiz MRN: [id number] Date of Birth: 12-Jan-1951  Today's Date: 07/12/2020 PT Individual Time: 1000-1059 PT Individual Time Calculation (min): 59 min   Short Term Goals: Week 2:  PT Short Term Goal 1 (Week 2): pt will consistently complete bed mobility with modA PT Short Term Goal 1 - Progress (Week 2): Met PT Short Term Goal 2 (Week 2): Pt will complete bed<>chair transfers with modA and LRAD PT Short Term Goal 2 - Progress (Week 2): Met PT Short Term Goal 3 (Week 2): Pt will ambulate 153f with minA and LRAD PT Short Term Goal 3 - Progress (Week 2): Met PT Short Term Goal 4 (Week 2): Pt will initiate stair training PT Short Term Goal 4 - Progress (Week 2): Met Week 3:  PT Short Term Goal 1 (Week 3): STG=LTG due to LOS  Skilled Therapeutic Interventions/Progress Updates:   Received pt supine in bed playing on phone, pt agreeable to therapy, and reported 3/10 pain in L ankle and R knee but reported receiving cream earlier this morning. Pt internally and externally distracted throughout session and required mod cues to set phone down and begin therapy. Session with emphasis on functional mobility/transfers, generalized strengthening, dynamic standing balance/coordination, gait training, L attention/awareness, NMR, toileting, and improved activity tolerance. Donned shoes with total A and pt transferred semi-reclined<>sitting EOB with supervision. Stand<>pivot bed<>WC with RW and CGA. [facility] for time management purposes. Pt ambulated 1889fwith RW and CGA. Pt with L inattention frequently running into wall on L requiring cues for attention. Pt continues to demonstrate decreased cadence, narrow BOS, decreased step and stride length and externally distracted by people in the hallway. Pt transferred sit<>stand without AD and CGA and performed alternating toe taps to 6in step 2x10 with min A with min  verbal cues for technique and sustained attention. Pt required multiple seated rest breaks throughout session due to increased fatigue. Worked on dynamic standing balance, L attention, visual scanning, problem solving, and fine motor control matching cards on mirror x 2 trials without AD and close supervision/CGA for balance with 100% accuracy. Pt ambulated [id number] rebounder without AD and CGA and worked on dynamic standing balance, reaction time, and coordination tossing ball against rebounder 2x10 reps with CGA for balance. Pt transported back to 5C [facility] WC [facility] and requested to return to bed but began ambulating towards bathroom stating "I need to change my brief, it's wet". Doffed pants and brief with mod A and pt found with dry brief. Pt requested to don gloves and was able to void minimally. Pt able to perform peri-care standing with CGA. Pt doffed clean brief and perseverated on getting a new one. Donned new brief with max A and pt required max A to pull pants/brief over hips. Pt ambulated [id number] RW and CGA to bed and side stepped L to [facility] and min cues. Pt transferred sit<>supine with supervision, increased time, and mod cues for technique and sequencing to get in the middle of the bed as pt initially lying crooked with L leg hanging off the bed. Concluded session with pt supine in bed, needs within reach, and bed alarm on.   Therapy Documentation Precautions:  Precautions Precautions: Fall Restrictions Weight Bearing Restrictions: No  Therapy/Group: Individual Therapy AnnaAlfonse Alpers DPT   07/12/2020, 7:28 AM

## 2020-07-12 NOTE — Progress Notes (Signed)
Cheryl Ortiz: Patient seen laying in bed this AM.  She states she slept well overnight.  She has some confusion with questions regarding stroke.   ROS: Denies CP, SOB, N/V/D  Objective: Vital Signs: Blood pressure 128/74, pulse 95, temperature 98.6 F (37 C), temperature source Oral, resp. rate 16, height 5\' 6"  (1.676 m), weight 101.8 kg, SpO2 97 %. No results found. No results for input(s): WBC, HGB, HCT, PLT in the last 72 hours. Recent Labs    07/12/20 0328  NA 138  K 3.5  CL 105  CO2 19*  GLUCOSE 96  BUN 15  CREATININE 0.83  CALCIUM 9.5    Intake/Output Summary (Last 24 hours) at 07/12/2020 1652 Last data filed at 07/12/2020 1324 Gross per 24 hour  Intake 386 ml  Output --  Net 386 ml        Physical Exam: BP 128/74 (BP Location: Right Arm)   Pulse 95   Temp 98.6 F (37 C) (Oral)   Resp 16   Ht 5\' 6"  (1.676 m)   Wt 101.8 kg   SpO2 97%   BMI 36.22 kg/m  Constitutional: No distress . Vital signs reviewed. HENT: Normocephalic.  Atraumatic. Eyes: EOMI. No discharge. Cardiovascular: No JVD.  RRR. Respiratory: Normal effort.  No stridor.  Bilateral clear to auscultation. GI: Non-distended.  BS +. Skin: Warm and dry.  Intact. Psych: Some confusion. Some slowing.  Musc: No edema in extremities.  No tenderness in extremities. Neuro: Alert Follows commands.  Improving insight and awareness Motor: Left upper extremity: 4/5 proximal distal, stable Left lower extremity: Hip flexion, knee extension 4-/5, ankle dorsiflexion 3+/5, stable  Assessment/Plan: 1. Functional deficits which require 3+ hours per day of interdisciplinary therapy in a comprehensive inpatient rehab setting.  Physiatrist is providing close team supervision and 24 hour management of active medical problems listed below.  Physiatrist and rehab team continue to assess barriers to discharge/monitor patient progress toward  functional and medical goals   Care Tool:  Bathing    Body parts bathed by patient: Left arm,Chest,Abdomen,Front perineal area,Buttocks,Right upper leg,Left upper leg,Face,Right lower leg,Right arm   Body parts bathed by helper: Left lower leg     Bathing assist Assist Level: Minimal Assistance - Patient > 75%     Upper Body Dressing/Undressing Upper body dressing   What is the patient wearing?: Pull over shirt,Bra    Upper body assist Assist Level: Moderate Assistance - Patient 50 - 74%    Lower Body Dressing/Undressing Lower body dressing      What is the patient wearing?: Pants,Incontinence brief     Lower body assist Assist for lower body dressing: Maximal Assistance - Patient 25 - 49%     Toileting Toileting    Toileting assist Assist for toileting: Minimal Assistance - Patient > 75%     Transfers Chair/bed transfer  Transfers assist     Chair/bed transfer assist level: Contact Guard/Touching assist     Locomotion Ambulation   Ambulation assist      Assist level: Minimal Assistance - Patient > 75% Assistive device: Walker-rolling Max distance: >253ft   Walk 10 feet activity   Assist     Assist level: Minimal Assistance - Patient > 75% Assistive device: Walker-rolling   Walk 50 feet activity   Assist    Assist level: Minimal Assistance - Patient > 75% Assistive device: Walker-rolling    Walk 150 feet activity   Assist Walk 150 feet  activity did not occur: Safety/medical concerns  Assist level: Minimal Assistance - Patient > 75% Assistive device: Walker-rolling    Walk 10 feet on uneven surface  activity   Assist Walk 10 feet on uneven surfaces activity did not occur: Safety/medical concerns   Assist level: Minimal Assistance - Patient > 75% Assistive device: Aeronautical engineer Will patient use wheelchair at discharge?: No      Wheelchair assist level: Dependent - Patient 0% Max wheelchair  distance: 200    Wheelchair 50 feet with 2 turns activity    Assist        Assist Level: Dependent - Patient 0%   Wheelchair 150 feet activity     Assist      Assist Level: Dependent - Patient 0%   Medical Problem List and Plan: 1.  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  Continue CIR  Repeat stat CT showing unchanged size of hematoma.  No changes per neurology 2.  Antithrombotics: -DVT/anticoagulation: SCDs             -antiplatelet therapy: N/A 3. Pain Management: Continue Tylenol as needed  Chronic left foot pain:   Does not appear to limit therapies   Voltaren gel to left ankle    Lidoderm patch ordered for left foot, with some improvement  Appears controlled with meds on 2/24 4. Mood: Provide emotional support             -antipsychotic agents: N/A 5. Neuropsych: This patient is not fully capable of making decisions on her own behalf.  Ritalin increased to 5 mg twice daily 2/12 with improvement 6. Skin/Wound Care: Routine skin checks 7. Fluids/Electrolytes/Nutrition: Routine in and outs  BMP within acceptable range on 2/24 8.  Diabetes mellitus type II with hyperglycemia.  Hemoglobin A1c 6.2.  Continue Glucophage 1000 mg daily.     CBG (last 3)  Recent Labs    07/12/20 0537 07/12/20 1134 07/12/20 1648  GLUCAP 88 86 75   Controlled on 2/24 9.  Hypertension.   HCTZ 25 mg daily, decreased to 12.5 on 2/10, DC'd on 2/15  Relatively controlled on 2/24 10.  History of kidney stones with stent placement 2017 11. Obesity (BMI 36.48): Provide dietary counseling 12.  Hypokalemia  Potassium 3.5 on 2/24  Supplemented x2 days on 2/11-2/12  Continue to monitor  LOS: 17 days A FACE TO FACE EVALUATION WAS PERFORMED  Cheryl Ortiz 07/12/2020, 4:52 PM

## 2020-07-12 NOTE — Progress Notes (Signed)
Physical Therapy Session Note  Patient Details  Name: Cheryl Ortiz MRN: [id number] Date of Birth: [birth year]  Today's Date: 07/12/2020 PT Individual Time: 1403-1500 PT Individual Time Calculation (min): 57 min   Short Term Goals: Week 2:  PT Short Term Goal 1 (Week 2): pt will consistently complete bed mobility with modA PT Short Term Goal 1 - Progress (Week 2): Met PT Short Term Goal 2 (Week 2): Pt will complete bed<>chair transfers with modA and LRAD PT Short Term Goal 2 - Progress (Week 2): Met PT Short Term Goal 3 (Week 2): Pt will ambulate 133f with minA and LRAD PT Short Term Goal 3 - Progress (Week 2): Met PT Short Term Goal 4 (Week 2): Pt will initiate stair training PT Short Term Goal 4 - Progress (Week 2): Met Week 3:  PT Short Term Goal 1 (Week 3): STG=LTG due to LOS  Skilled Therapeutic Interventions/Progress Updates:    Patient supine in bed upon PT arrival. Patient paying attention to smartphone but initially agreeable to PT session. Pt puts phone away for brief period and asks if this PT has brought a shower chair. Related to pt that this therapist had no idea she was in need of one, but if we come across one, we can bring it back with [place] No pain complaint throughout session.   Therapeutic Activity: Bed Mobility: Patient performed supine <> sit with CGA/ supervision with consistent vc re: bringing BLE to EOB as pt will start and then stop to pay attn to phone. VC/ tc for continued effort, attn to task, technique.  Transfers: Patient performed STS and SPVT transfers throughout session to/ with RW with  Supervision and intermittent CGA. Provided verbal cues for forward lean and technique.  Gait Training:  Patient ambulated 150' x1/ 125' x1using RW with CGA and w/c follow for fatigue/ balance. Ambulated with mild distractibility and required some redirection for attn to walker mgmt. VC for increasing step height/ length.   Neuromuscular Re-ed: NMR facilitated during  session with focus on standing balance and dual task requiring focused attn for letter memory task on BITS. Pt guided in 353m bouts of 5sec memorization of 5 letters and then reach about entire screen to recreate order of letters from screen. Pt scores 75% both bouts and requires vc for reading letter sequence aloud with improved memory of sequence.  No LOB noted during bouts and pt is able to reach to all quadrants of screen using both UE and stepping forward with each LE for differing reaches. NMR performed for improvements in motor control and coordination, balance, sequencing, judgement, and self confidence/ efficacy in performing all aspects of mobility at highest level of independence.   Patient supine in bed at end of session with brakes locked, bed alarm set, and all needs within reach.  Therapy Documentation Precautions:  Precautions Precautions: Fall Restrictions Weight Bearing Restrictions: No  Therapy/Group: Individual Therapy  JuAlger Simons/24/2022, 7:06 PM

## 2020-07-13 LAB — GLUCOSE, CAPILLARY
Glucose-Capillary: 83 mg/dL (ref 70–99)
Glucose-Capillary: 117 mg/dL — ABNORMAL HIGH (ref 70–99)
Glucose-Capillary: 85 mg/dL (ref 70–99)
Glucose-Capillary: 91 mg/dL (ref 70–99)

## 2020-07-13 NOTE — Progress Notes (Signed)
Patient ID: [name], female   DOB: Jul 30, 1950, 70 y.o.   MRN: [id number] Re-faxed FMLA paperwork to daughter's employer. Placed completed forms in pt's room in the top of the right cabinet where daughter wanted them left.

## 2020-07-13 NOTE — Progress Notes (Signed)
Speech Language Pathology Daily Session Note  Patient Details  Name: Cheryl Ortiz MRN: [id number] Date of Birth: [birth year]  Today's Date: 07/13/2020 SLP Individual Time: 1100-1200 SLP Individual Time Calculation (min): 60 min  Short Term Goals: Week 3: SLP Short Term Goal 1 (Week 3): STG=LTG due to extend length of stay (execpted discharge 3/3)  Skilled Therapeutic Interventions:   Patient seen for skilled ST session focusing on cognitive function goals. Patient was able to attend during conversation and structured tasks with overall min-modA cues to redirect. During task of writing down 5 different foods that start with B, she started to get fixated on banana (banana pudding, banana smoothies) and would start to write tangentially, requiring verbal redirection cue. Patient demonstrated improved frequency of eye contact with SLP as well as more turn taking during conversation. She continues to benefit from skilled SLP intervention to maximize cognitive-linguistic function prior to discharge.  Pain Pain Assessment Pain Scale: 0-10 Pain Score: 0-No pain  Therapy/Group: Individual Therapy  Sonia Baller, MA, CCC-SLP Speech Therapy

## 2020-07-13 NOTE — Progress Notes (Signed)
[place] PHYSICAL MEDICINE & REHABILITATION PROGRESS NOTE  Subjective/Complaints: Patient seen lying in bed this morning.  She states she slept well overnight.  She states that she had to wait a very long time to get a shower yesterday.  ROS: Denies CP, SOB, N/V/D  Objective: Vital Signs: Blood pressure 111/90, pulse 87, temperature 98.1 F (36.7 C), temperature source Oral, resp. rate 18, height 5\' 6"  (1.676 m), weight 101.8 kg, SpO2 93 %. No results found. No results for input(s): WBC, HGB, HCT, PLT in the last 72 hours. Recent Labs    07/12/20 0328  NA 138  K 3.5  CL 105  CO2 19*  GLUCOSE 96  BUN 15  CREATININE 0.83  CALCIUM 9.5    Intake/Output Summary (Last 24 hours) at 07/13/2020 1033 Last data filed at 07/12/2020 1324 Gross per 24 hour  Intake 296 ml  Output --  Net 296 ml        Physical Exam: BP 111/90 (BP Location: Left Arm)   Pulse 87   Temp 98.1 F (36.7 C) (Oral)   Resp 18   Ht 5\' 6"  (1.676 m)   Wt 101.8 kg   SpO2 93%   BMI 36.22 kg/m  Constitutional: No distress . Vital signs reviewed. HENT: Normocephalic.  Atraumatic. Eyes: EOMI. No discharge. Cardiovascular: No JVD.  RRR. Respiratory: Normal effort.  No stridor.  Bilateral clear to auscultation. GI: Non-distended.  BS +. Skin: Warm and dry.  Intact. Psych: Slow processing.  Flat. Musc: No edema in extremities.  No tenderness in extremities. Neuro: Alert Follows commands.  Gradual improving insight and awareness Motor: Left upper extremity: 4/5 proximal distal, stable Left lower extremity: Hip flexion, knee extension 4-/5, ankle dorsiflexion 3+/5, improving Assessment/Plan: 1. Functional deficits which require 3+ hours per day of interdisciplinary therapy in a comprehensive inpatient rehab setting.  Physiatrist is providing close team supervision and 24 hour management of active medical problems listed below.  Physiatrist and rehab team continue to assess barriers to discharge/monitor  patient progress toward functional and medical goals   Care Tool:  Bathing    Body parts bathed by patient: Left arm,Chest,Abdomen,Front perineal area,Buttocks,Right upper leg,Left upper leg,Face,Right lower leg,Right arm   Body parts bathed by helper: Left lower leg     Bathing assist Assist Level: Minimal Assistance - Patient > 75%     Upper Body Dressing/Undressing Upper body dressing   What is the patient wearing?: Pull over shirt,Bra    Upper body assist Assist Level: Moderate Assistance - Patient 50 - 74%    Lower Body Dressing/Undressing Lower body dressing      What is the patient wearing?: Pants,Incontinence brief     Lower body assist Assist for lower body dressing: Maximal Assistance - Patient 25 - 49%     Toileting Toileting    Toileting assist Assist for toileting: Minimal Assistance - Patient > 75%     Transfers Chair/bed transfer  Transfers assist     Chair/bed transfer assist level: Contact Guard/Touching assist     Locomotion Ambulation   Ambulation assist      Assist level: Contact Guard/Touching assist Assistive device: Walker-rolling Max distance: 156ft   Walk 10 feet activity   Assist     Assist level: Contact Guard/Touching assist Assistive device: Walker-rolling   Walk 50 feet activity   Assist    Assist level: Contact Guard/Touching assist Assistive device: Walker-rolling    Walk 150 feet activity   Assist Walk 150 feet activity did not  occur: Safety/medical concerns  Assist level: Contact Guard/Touching assist Assistive device: Walker-rolling    Walk 10 feet on uneven surface  activity   Assist Walk 10 feet on uneven surfaces activity did not occur: Safety/medical concerns   Assist level: Minimal Assistance - Patient > 75% Assistive device: Aeronautical engineer Will patient use wheelchair at discharge?: No      Wheelchair assist level: Dependent - Patient 0% Max  wheelchair distance: 200    Wheelchair 50 feet with 2 turns activity    Assist        Assist Level: Dependent - Patient 0%   Wheelchair 150 feet activity     Assist      Assist Level: Dependent - Patient 0%   Medical Problem List and Plan: 1.  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  Continue CIR  Repeat stat CT showing unchanged size of hematoma.  No changes per neurology 2.  Antithrombotics: -DVT/anticoagulation: SCDs             -antiplatelet therapy: N/A 3. Pain Management: Continue Tylenol as needed  Chronic left foot pain:   Does not appear to limit therapies   Voltaren gel to left ankle    Lidoderm patch ordered for left foot, with some improvement  Appears controlled with meds and distraction on 2/25 4. Mood: Provide emotional support             -antipsychotic agents: N/A 5. Neuropsych: This patient is not fully capable of making decisions on her own behalf.  Ritalin increased to 5 mg twice daily 2/12 with improvement 6. Skin/Wound Care: Routine skin checks 7. Fluids/Electrolytes/Nutrition: Routine in and outs  BMP within acceptable range on 2/24, labs ordered for Monday 8.  Diabetes mellitus type II with hyperglycemia.  Hemoglobin A1c 6.2.  Continue Glucophage 1000 mg daily.     CBG (last 3)  Recent Labs    07/12/20 1648 07/12/20 2122 07/13/20 0539  GLUCAP 75 85 83   Controlled on 2/25 9.  Hypertension.   HCTZ 25 mg daily, decreased to 12.5 on 2/10, DC'd on 2/15  Relatively controlled on 2/25 10.  History of kidney stones with stent placement 2017 11. Obesity (BMI 36.48): Provide dietary counseling 12.  Hypokalemia  Potassium 3.5 on 2/24, labs ordered for Monday  Supplemented x2 days on 2/11-2/12  Continue to monitor  LOS: 18 days A FACE TO FACE EVALUATION WAS PERFORMED  Valor Quaintance Lorie Phenix 07/13/2020,  10:33 AM

## 2020-07-13 NOTE — Progress Notes (Signed)
Physical Therapy Session Note  Patient Details  Name: Cheryl Ortiz MRN: [id number] Date of Birth: [birth year]  Today's Date: 07/13/2020 PT Individual Time: 4734-0370 PT Individual Time Calculation (min): 40 min   Short Term Goals: Week 2:  PT Short Term Goal 1 (Week 2): pt will consistently complete bed mobility with modA PT Short Term Goal 1 - Progress (Week 2): Met PT Short Term Goal 2 (Week 2): Pt will complete bed<>chair transfers with modA and LRAD PT Short Term Goal 2 - Progress (Week 2): Met PT Short Term Goal 3 (Week 2): Pt will ambulate 161f with minA and LRAD PT Short Term Goal 3 - Progress (Week 2): Met PT Short Term Goal 4 (Week 2): Pt will initiate stair training PT Short Term Goal 4 - Progress (Week 2): Met Week 3:  PT Short Term Goal 1 (Week 3): STG=LTG due to LOS  Skilled Therapeutic Interventions/Progress Updates:   Received pt sitting EOB distracted by phone, pt agreeable to therapy, and denied any pain during session. Session with emphasis on functional mobility/transfers, generalized strengthening, dynamic standing balance/coordination, L attention/awareness, initiation, ambulation, toileting, and improved activity tolerance. Donned R shoe with max A and pt transferred bed<>WC stand<>pivot without AD and CGA. Pt transported to 4W dayroom in [facility]. Worked on dynamic standing balance, fine motor control, attention, and problem solving creating pictures of legos standing without UE support and close supervision for 5 minutes x 1. Pt required mod cues for sequencing and stated "this is getting on my nerves". When therapist asked pt to explain she stated "it's just frustrating" ultimately refusing to participate any further. Pt ambulated 1557fwithout AD and min A. Pt demonstrates decreased cadence, narrow BOS, decreased trunk rotation and arm swing, and decreased bilateral foot clearance and step length. Pt very distracted by people and objects in the hallway holding  onto rails (despite therapist's cues not to for increased challenge) and countertops and grabbing for computers in the hallway. Pt frequently bumping into obstacles and unable to focus. [facility] and reported urge to toilet. Pt ambulated [id number] RW and supervision to bathroom and required mod A to doff brief/pants. Pt began taking off clean brief stating " it smells and it's dirty". Pt able to void and performed peri-care standing with supervision. Pt ambulated 8ft28fth RW and CGA to bed and doffed shoes with total A. Pt transferred sit<>supine with supervision and required mod cues for midline orientation in bed. Concluded session with pt supine in bed, needs within reach, and bed alarm on.   Therapy Documentation Precautions:  Precautions Precautions: Fall Restrictions Weight Bearing Restrictions: No  Therapy/Group: Individual Therapy AnnaAlfonse Alpers DPT   07/13/2020, 7:31 AM

## 2020-07-13 NOTE — Progress Notes (Signed)
Occupational Therapy Weekly Progress Note  Patient Details  Name: Cheryl Ortiz MRN: [id number] Date of Birth: [birth year]  Beginning of progress report period: July 04, 2020 End of progress report period: July 13, 2020  Today's Date: 07/13/2020 OT Individual Time: 1000-1109 OT Individual Time Calculation (min): 69 min    Patient has met 3 of 4 short term goals.  Pt is making steady progress towards goals.  Pt currently requires increased time and cues for initiation with bed mobility and sit > stand, however is able to complete with overall CGA.  Pt completes ambulatory toilet transfers with RW with CGA and is able to complete clothing management and hygiene with CGA.  Pt continues to require increased time and cues for L attention especially with transfers into room shower but is able to complete walk-in shower transfer with min assist and max cues.  Pt will benefit from family education prior to d/c as pt will require frequent cues for initiation, sequencing, and L attention during self-care tasks and functional mobility.  Patient continues to demonstrate the following deficits: decreased cardiorespiratoy endurance, unbalanced muscle activation and motor apraxia, left side neglect, decreased initiation, decreased attention, decreased awareness, decreased problem solving, decreased memory and delayed processing and decreased standing balance, decreased postural control, hemiplegia and decreased balance strategies and therefore will continue to benefit from skilled OT intervention to enhance overall performance with BADL and Reduce care partner burden.  Patient progressing toward long term goals..  Continue plan of care.  OT Short Term Goals Week 2:  OT Short Term Goal 1 (Week 2): Pt will demonstrate improved L side awareness when stepping into the shower only needing 1 verbal cue to fully step to her L.. OT Short Term Goal 1 - Progress (Week 2): Progressing toward goal OT Short  Term Goal 2 (Week 2): Pt will don pants with mod A or less. OT Short Term Goal 2 - Progress (Week 2): Met OT Short Term Goal 3 (Week 2): Pt will don a shirt with verbal cues only. OT Short Term Goal 3 - Progress (Week 2): Met OT Short Term Goal 4 (Week 2): Pt will complete toilet transfer with min A during a stand pivot. OT Short Term Goal 4 - Progress (Week 2): Met Week 3:  OT Short Term Goal 1 (Week 3): STG = LTGs due to remaining LOS  Skilled Therapeutic Interventions/Progress Updates:    Treatment session with focus on self-care retraining, functional mobility, and initiation during all tasks.  Pt received semi-reclined in bed agreeable to shower.  Pt completed bed mobility supervision with mod cues for initiation.  Pt then required increased time and cues for sit > stand from EOB.  Pt ultimately able to come to standing with close supervision from EOB.  Pt ambulated to room shower with RW with CGA.  Mod cues for sequencing and attention to L to step in to walk-in shower over threshold.  Once pt in shower, pt then reports needing to have BM.  Pt exited shower with increased sequencing as transferring to R.  Pt completed toileting with CGA and increased time when completing hygiene.  Pt then returned to shower, again requiring increased cues for sequencing and attention to L when stepping over threshold.  Pt completed bathing at overall supervision level when seated, requiring CGA when standing to wash buttocks.  Pt ambulated back to EOB to engage in dressing.  Pt able to don shirt with increased time and supervision.  Pt then able to thread  RLE and pull pants over hips at sit > stand level with CGA, but required assistance to thread LLE.  Therapist donned TEDS, pt able to don shoes with min cues and therapist fastening shoes.  Pt remained seated EOB with nurse tech arriving to supervise while pt dons sweatshirt and gets set up for lunch.  Therapy Documentation Precautions:  Precautions Precautions:  Fall Restrictions Weight Bearing Restrictions: No General:   Vital Signs: Therapy Vitals Temp: 98.1 F (36.7 C) Temp Source: Oral Pulse Rate: 87 Resp: 18 BP: 111/90 Patient Position (if appropriate): Lying Oxygen Therapy SpO2: 93 % O2 Device: Room Air Pain: Pain Assessment Pain Scale: 0-10 Pain Score: 0-No pain   Therapy/Group: Individual Therapy  HOXIE, SARAH 07/13/2020, 7:26 AM   

## 2020-07-14 LAB — GLUCOSE, CAPILLARY
Glucose-Capillary: 86 mg/dL (ref 70–99)
Glucose-Capillary: 91 mg/dL (ref 70–99)
Glucose-Capillary: 107 mg/dL — ABNORMAL HIGH (ref 70–99)
Glucose-Capillary: 92 mg/dL (ref 70–99)

## 2020-07-14 NOTE — Progress Notes (Signed)
Occupational Therapy Session Note  Patient Details  Name: ALLIENE KLUGH MRN: [id number] Date of Birth: 1950/11/02  Today's Date: 07/14/2020 OT Individual Time: 1400-1429 OT Individual Time Calculation (min): 29 min   Skilled Therapeutic Interventions/Progress Updates:    Pt greeted in bed with no c/o pain. Per pts dtr, pts ADL needs were met today. To work on [employer] attention and [employer], set pt up to engage in numerous laundry folding tasks including wash cloths, towels, fitted sheets, sheets, and pillowcases. 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 Pt took her time during folding tasks, explained to OT how laundromats are typically run and tricks for getting stains out of linen. She did well with attending to the Lt side and folded evenly but did need assistance to fold the fitted sheet due to her deficits with Lt attention and divided attention (conversing with OT while folding). At end of session pt remained in bed with all needs within reach and bed alarm set.   Therapy Documentation Precautions:  Precautions Precautions: Fall Restrictions Weight Bearing Restrictions: No Vital Signs: Therapy Vitals Temp: 98.9 F (37.2 C) Temp Source: Oral Pulse Rate: 90 Resp: 17 BP: 111/61 Patient Position (if appropriate): Lying Oxygen Therapy SpO2: 96 % O2 Device: Room Air ADL: ADL Eating: Set up Grooming: Setup Upper Body Bathing: Moderate cueing Where Assessed-Upper Body Bathing: Shower Lower Body Bathing: Moderate cueing,Minimal assistance Where Assessed-Lower Body Bathing: Shower Upper Body Dressing: Moderate cueing,Minimal assistance Where Assessed-Upper Body Dressing: Wheelchair Lower Body Dressing: Maximal assistance Where Assessed-Lower Body Dressing: Wheelchair Toileting: Moderate assistance,Moderate cueing Where Assessed-Toileting: Glass blower/designer: Psychiatric nurse Method: Arts development officer: Sales promotion account executive: Environmental education officer Method: Radiographer, therapeutic: Transfer tub bench,Grab bars      Therapy/Group: Individual Therapy  Locklan Canoy A Raynor Calcaterra 07/14/2020, 3:45 PM

## 2020-07-15 LAB — GLUCOSE, CAPILLARY
Glucose-Capillary: 99 mg/dL (ref 70–99)
Glucose-Capillary: 97 mg/dL (ref 70–99)
Glucose-Capillary: 81 mg/dL (ref 70–99)
Glucose-Capillary: 108 mg/dL — ABNORMAL HIGH (ref 70–99)
Glucose-Capillary: 98 mg/dL (ref 70–99)

## 2020-07-15 MED ORDER — SENNOSIDES-DOCUSATE SODIUM 8.6-50 MG PO TABS: 1.0000 | ORAL_TABLET | Freq: Every evening | ORAL | Status: DC | PRN

## 2020-07-15 NOTE — Progress Notes (Signed)
Pt refused ted hose but agreed to SCD. RN put SCD on for pt. PT stable at this time. RN will continue to monitor.

## 2020-07-15 NOTE — Progress Notes (Signed)
Cheryl Ortiz PHYSICAL MEDICINE & REHABILITATION PROGRESS NOTE  Subjective/Complaints:  Pt reports poor appetite, because doesn't like hospital food- LBM last night- having diarrhea.  Food "goes right through her". No matter what it is.   No HA's. Denies pain.   ROS:  Pt denies SOB, abd pain, CP, N/V/C, and vision changes   Objective: Vital Signs: Blood pressure 112/65, pulse 74, temperature 98.6 F (37 C), temperature source Oral, resp. rate 18, height 5\' 6"  (1.676 m), weight 101.8 kg, SpO2 97 %. No results found. No results for input(s): WBC, HGB, HCT, PLT in the last 72 hours. No results for input(s): NA, K, CL, CO2, GLUCOSE, BUN, CREATININE, CALCIUM in the last 72 hours.  Intake/Output Summary (Last 24 hours) at 07/15/2020 1600 Last data filed at 07/15/2020 1435 Gross per 24 hour  Intake 130 ml  Output --  Net 130 ml        Physical Exam: BP 112/65 (BP Location: Left Arm)   Pulse 74   Temp 98.6 F (37 C) (Oral)   Resp 18   Ht 5\' 6"  (1.676 m)   Wt 101.8 kg   SpO2 97%   BMI 36.22 kg/m    General: awake, alert, appropriate, NAD-  HENT:  oropharynx moist- CV: regular rate; no JVD Pulmonary: CTA B/L; no W/R/R- good air movement GI: soft, NT, ND, (+)BS- hypoactive Psychiatric: appropriate, but flat Neurological: alert, slowed processing  Skin: Warm and dry.  Intact. Musc: No edema in extremities.  No tenderness in extremities. Neuro: Alert Follows commands.  Gradual improving insight and awareness Motor: Left upper extremity: 4/5 proximal distal, stable Left lower extremity: Hip flexion, knee extension 4-/5, ankle dorsiflexion 3+/5, improving   Assessment/Plan: 1. Functional deficits which require 3+ hours per day of interdisciplinary therapy in a comprehensive inpatient rehab setting.  Physiatrist is providing close team supervision and 24 hour management of active medical problems listed below.  Physiatrist and rehab team continue to assess barriers to  discharge/monitor patient progress toward functional and medical goals   Care Tool:  Bathing    Body parts bathed by patient: Left arm,Chest,Abdomen,Front perineal area,Buttocks,Right upper leg,Left upper leg,Face,Right lower leg,Right arm   Body parts bathed by helper: Left lower leg     Bathing assist Assist Level: Contact Guard/Touching assist     Upper Body Dressing/Undressing Upper body dressing   What is the patient wearing?: Pull over shirt    Upper body assist Assist Level: Supervision/Verbal cueing    Lower Body Dressing/Undressing Lower body dressing      What is the patient wearing?: Pants,Incontinence brief     Lower body assist Assist for lower body dressing: Moderate Assistance - Patient 50 - 74%     Toileting Toileting    Toileting assist Assist for toileting: Contact Guard/Touching assist     Transfers Chair/bed transfer  Transfers assist     Chair/bed transfer assist level: Contact Guard/Touching assist     Locomotion Ambulation   Ambulation assist      Assist level: Minimal Assistance - Patient > 75% Assistive device: No Device Max distance: 123ft   Walk 10 feet activity   Assist     Assist level: Minimal Assistance - Patient > 75% Assistive device: No Device   Walk 50 feet activity   Assist    Assist level: Minimal Assistance - Patient > 75% Assistive device: No Device    Walk 150 feet activity   Assist Walk 150 feet activity did not occur: Safety/medical concerns  Assist level: Minimal Assistance - Patient > 75% Assistive device: No Device    Walk 10 feet on uneven surface  activity   Assist Walk 10 feet on uneven surfaces activity did not occur: Safety/medical concerns   Assist level: Minimal Assistance - Patient > 75% Assistive device: Aeronautical engineer Will patient use wheelchair at discharge?: No      Wheelchair assist level: Dependent - Patient 0% Max wheelchair  distance: 200    Wheelchair 50 feet with 2 turns activity    Assist        Assist Level: Dependent - Patient 0%   Wheelchair 150 feet activity     Assist      Assist Level: Dependent - Patient 0%   Medical Problem List and Plan: 1.  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  Continue CIR  Repeat stat CT showing unchanged size of hematoma.  No changes per neurology 2.  Antithrombotics: -DVT/anticoagulation: SCDs             -antiplatelet therapy: N/A 3. Pain Management: Continue Tylenol as needed  Chronic left foot pain:   Does not appear to limit therapies   Voltaren gel to left ankle    Lidoderm patch ordered for left foot, with some improvement  Appears controlled with meds and distraction on 2/25  2/27- doesn't c/o pain today 4. Mood: Provide emotional support             -antipsychotic agents: N/A 5. Neuropsych: This patient is not fully capable of making decisions on her own behalf.  Ritalin increased to 5 mg twice daily 2/12 with improvement 6. Skin/Wound Care: Routine skin checks 7. Fluids/Electrolytes/Nutrition: Routine in and outs  BMP within acceptable range on 2/24, labs ordered for Monday 8.  Diabetes mellitus type II with hyperglycemia.  Hemoglobin A1c 6.2.  Continue Glucophage 1000 mg daily.     CBG (last 3)  Recent Labs    07/15/20 0515 07/15/20 0606 07/15/20 1150  GLUCAP 99 97 81   2/27- Controlled in 80s-90s- con't regimen 9.  Hypertension.   HCTZ 25 mg daily, decreased to 12.5 on 2/10, DC'd on 2/15  Relatively controlled on 2/25 10.  History of kidney stones with stent placement 2017 11. Obesity (BMI 36.48): Provide dietary counseling 12.  Hypokalemia  Potassium 3.5 on 2/24, labs ordered for Monday  Supplemented x2 days on 2/11-2/12  Continue to monitor 13. Diarrhea  2/27- food goes straight  "through her" - will change senokot 1 tab BID to prn daily.   LOS: 20 days A FACE TO FACE EVALUATION WAS PERFORMED  Ladonte Verstraete 07/15/2020, 4:00 PM

## 2020-07-15 NOTE — Progress Notes (Signed)
Speech Language Pathology Daily Session Note  Patient Details  Name: Cheryl Ortiz MRN: [id number] Date of Birth: [birth year]  Today's Date: 07/15/2020 SLP Individual Time: 2902-1115 SLP Individual Time Calculation (min): 30 min  Short Term Goals: Week 3: SLP Short Term Goal 1 (Week 3): STG=LTG due to extend length of stay (execpted discharge 3/3)  Skilled Therapeutic Interventions:   Pt was seen for skilled ST targeting cognitive goals.  Pt was sitting in bed talking on the phone with her friend.  Therapist introduced herself and pt acknowledged therapist but continued on her phone conversation with her friend for an additional 15 minutes despite multiple attempts to get pt to end phone conversation and phone conversation being ended x1 due to poor connection.  Suspect internal distractions and decreased attention to therapist in room (SLP standing on left side) to be contributing to this as pt's engagement in treatment improved once therapist moved to pt's right side.  Pt was able to identify "feeling foggy" as an acute deficit post ICH with mod question cues for awareness of current limitations as well as for topic maintenance; however, she continues to present with decreased awareness of how these deficits will impact her functional independence in the home environment, reporting that her biggest concerns for going home will be figuring out how to drive to the store to do her grocery shopping.   Pt was left in bed with bed alarm set and call bell within reach.  Continue per current plan of care.   Pain Pain Assessment Pain Scale: 0-10 Pain Score: 0-No pain  Therapy/Group: Individual Therapy  Page, Selinda Orion 07/15/2020, 10:02 AM

## 2020-07-15 NOTE — Progress Notes (Signed)
Occupational Therapy Session Note  Patient Details  Name: Cheryl Ortiz MRN: [id number] Date of Birth: May 04, 1951  Today's Date: 07/15/2020 OT Individual Time: 2353-6144 OT Individual Time Calculation (min): 26 min    Short Term Goals: Week 2:  OT Short Term Goal 1 (Week 2): Pt will demonstrate improved L side awareness when stepping into the shower only needing 1 verbal cue to fully step to her L.. OT Short Term Goal 1 - Progress (Week 2): Progressing toward goal OT Short Term Goal 2 (Week 2): Pt will don pants with mod A or less. OT Short Term Goal 2 - Progress (Week 2): Met OT Short Term Goal 3 (Week 2): Pt will don a shirt with verbal cues only. OT Short Term Goal 3 - Progress (Week 2): Met OT Short Term Goal 4 (Week 2): Pt will complete toilet transfer with min A during a stand pivot. OT Short Term Goal 4 - Progress (Week 2): Met Week 3:  OT Short Term Goal 1 (Week 3): STG = LTGs due to remaining LOS  Skilled Therapeutic Interventions/Progress Updates:    pt greeted at time of session supine in bed on phone, agreeable to OT session. Declined ADL, but agreeable to UB there ex. Declined going down to gym but wanted to do strengthening in [place]. Weight retrieved and supine > sit Supervision, sit > stand same manner. Performed BUE therex in standing w/ 4# dowel to promote symmetry of BUEs and strengthening for ADL skill performance. 2x10-15 bicep curl, chest press, overhead press, FWD circles all in standing with cues for form. Note extended time required between transitions and verbal cues to sequence. Returned to supine Supervision alarm on call bell in reach.   Therapy Documentation Precautions:  Precautions Precautions: Fall Restrictions Weight Bearing Restrictions: No    Therapy/Group: Individual Therapy  Viona Gilmore 07/15/2020, 4:29 PM

## 2020-07-16 LAB — CBC WITH DIFFERENTIAL/PLATELET
Abs Immature Granulocytes: 0.02 10*3/uL (ref 0.00–0.07)
Basophils Absolute: 0.1 10*3/uL (ref 0.0–0.1)
Basophils Relative: 1 %
Eosinophils Absolute: 0.1 10*3/uL (ref 0.0–0.5)
Eosinophils Relative: 2 %
HCT: 38.8 % (ref 36.0–46.0)
Hemoglobin: 12.9 g/dL (ref 12.0–15.0)
Immature Granulocytes: 0 %
Lymphocytes Relative: 23 %
Lymphs Abs: 1.3 10*3/uL (ref 0.7–4.0)
MCH: 29.1 pg (ref 26.0–34.0)
MCHC: 33.2 g/dL (ref 30.0–36.0)
MCV: 87.4 fL (ref 80.0–100.0)
Monocytes Absolute: 0.4 10*3/uL (ref 0.1–1.0)
Monocytes Relative: 8 %
Neutro Abs: 3.8 10*3/uL (ref 1.7–7.7)
Neutrophils Relative %: 66 %
Platelets: 335 10*3/uL (ref 150–400)
RBC: 4.44 MIL/uL (ref 3.87–5.11)
RDW: 12.9 % (ref 11.5–15.5)
WBC: 5.7 10*3/uL (ref 4.0–10.5)
nRBC: 0 % (ref 0.0–0.2)

## 2020-07-16 LAB — BASIC METABOLIC PANEL
Anion gap: 11 (ref 5–15)
BUN: 12 mg/dL (ref 8–23)
CO2: 22 mmol/L (ref 22–32)
Calcium: 9.6 mg/dL (ref 8.9–10.3)
Chloride: 105 mmol/L (ref 98–111)
Creatinine, Ser: 0.87 mg/dL (ref 0.44–1.00)
GFR, Estimated: >60 mL/min (ref >60)
Glucose, Bld: 112 mg/dL — ABNORMAL HIGH (ref 70–99)
Potassium: 3.2 mmol/L — ABNORMAL LOW (ref 3.5–5.1)
Sodium: 138 mmol/L (ref 135–145)

## 2020-07-16 LAB — GLUCOSE, CAPILLARY
Glucose-Capillary: 107 mg/dL — ABNORMAL HIGH (ref 70–99)
Glucose-Capillary: 112 mg/dL — ABNORMAL HIGH (ref 70–99)
Glucose-Capillary: 107 mg/dL — ABNORMAL HIGH (ref 70–99)
Glucose-Capillary: 109 mg/dL — ABNORMAL HIGH (ref 70–99)

## 2020-07-16 MED ORDER — POTASSIUM CHLORIDE CRYS ER 20 MEQ PO TBCR
30.0000 meq | EXTENDED_RELEASE_TABLET | Freq: Two times a day (BID) | ORAL | Status: AC
  Administered 2020-07-16 – 2020-07-18 (×4): 30 meq via ORAL
  Filled 2020-07-16 (×4): qty 1

## 2020-07-16 NOTE — Progress Notes (Addendum)
[facility] PHYSICAL MEDICINE & REHABILITATION PROGRESS NOTE  Subjective/Complaints: Patient seen sitting up in bed this morning.  She states she slept well overnight.  She notes improvement in strength and cognition.  She is working with therapies.  ROS: Denies CP, SOB, N/V/D  Objective: Vital Signs: Blood pressure (!) 104/58, pulse 77, temperature 97.6 F (36.4 C), temperature source Oral, resp. rate 14, height 5\' 6"  (1.676 m), weight 101.8 kg, SpO2 99 %. No results found. Recent Labs    07/16/20 1127  WBC 5.7  HGB 12.9  HCT 38.8  PLT 335   Recent Labs    07/16/20 1127  NA 138  K 3.2*  CL 105  CO2 22  GLUCOSE 112*  BUN 12  CREATININE 0.87  CALCIUM 9.6    Intake/Output Summary (Last 24 hours) at 07/16/2020 1325 Last data filed at 07/16/2020 0900 Gross per 24 hour  Intake 50 ml  Output --  Net 50 ml     Pressure Injury 07/16/20 Buttocks Left;Posterior;Medial Stage 2 -  Partial thickness loss of dermis presenting as a shallow open injury with a red, pink wound bed without slough. open pink wound (Active)  07/16/20 0344  Location: Buttocks  Location Orientation: Left;Posterior;Medial  Staging: Stage 2 -  Partial thickness loss of dermis presenting as a shallow open injury with a red, pink wound bed without slough.  Wound Description (Comments): open pink wound  Present on Admission: No    Physical Exam: BP (!) 104/58 (BP Location: Left Arm)   Pulse 77   Temp 97.6 F (36.4 C) (Oral)   Resp 14   Ht 5\' 6"  (1.676 m)   Wt 101.8 kg   SpO2 99%   BMI 36.22 kg/m  Constitutional: No distress . Vital signs reviewed. HENT: Normocephalic.  Atraumatic. Eyes: EOMI. No discharge. Cardiovascular: No JVD.  RRR. Respiratory: Normal effort.  No stridor.  Bilateral clear to auscultation. GI: Non-distended.  BS +. Skin: Warm and dry.  Intact. Psych: Normal mood.  Normal behavior. Musc: No edema in extremities.  No tenderness in extremities. Neuro: Alert and oriented  x4 Gradual improving insight and awareness Motor: Left upper extremity: 4-4+/5 proximal distal Left lower extremity: Hip flexion, knee extension 4-4+/5, ankle dorsiflexion 4+/5  Assessment/Plan: 1. Functional deficits which require 3+ hours per day of interdisciplinary therapy in a comprehensive inpatient rehab setting.  Physiatrist is providing close team supervision and 24 hour management of active medical problems listed below.  Physiatrist and rehab team continue to assess barriers to discharge/monitor patient progress toward functional and medical goals   Care Tool:  Bathing    Body parts bathed by patient: Left arm,Chest,Abdomen,Front perineal area,Buttocks,Right upper leg,Left upper leg,Face,Right lower leg,Right arm   Body parts bathed by helper: Left lower leg     Bathing assist Assist Level: Contact Guard/Touching assist     Upper Body Dressing/Undressing Upper body dressing   What is the patient wearing?: Pull over shirt    Upper body assist Assist Level: Supervision/Verbal cueing    Lower Body Dressing/Undressing Lower body dressing      What is the patient wearing?: Pants,Incontinence brief     Lower body assist Assist for lower body dressing: Moderate Assistance - Patient 50 - 74%     Toileting Toileting    Toileting assist Assist for toileting: Supervision/Verbal cueing     Transfers Chair/bed transfer  Transfers assist     Chair/bed transfer assist level: Contact Guard/Touching assist     Locomotion Ambulation  Ambulation assist      Assist level: Supervision/Verbal cueing Assistive device: Walker-rolling Max distance: 170ft   Walk 10 feet activity   Assist     Assist level: Supervision/Verbal cueing Assistive device: Walker-rolling   Walk 50 feet activity   Assist    Assist level: Supervision/Verbal cueing Assistive device: Walker-rolling    Walk 150 feet activity   Assist Walk 150 feet activity did not occur:  Safety/medical concerns  Assist level: Supervision/Verbal cueing Assistive device: Walker-rolling    Walk 10 feet on uneven surface  activity   Assist Walk 10 feet on uneven surfaces activity did not occur: Safety/medical concerns   Assist level: Minimal Assistance - Patient > 75% Assistive device: Aeronautical engineer Will patient use wheelchair at discharge?: No      Wheelchair assist level: Dependent - Patient 0% Max wheelchair distance: 200    Wheelchair 50 feet with 2 turns activity    Assist        Assist Level: Dependent - Patient 0%   Wheelchair 150 feet activity     Assist      Assist Level: Dependent - Patient 0%   Medical Problem List and Plan: 1.  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  Continue CIR  Repeat stat CT showing unchanged size of hematoma.  No changes per neurology 2.  Antithrombotics: -DVT/anticoagulation: SCDs             -antiplatelet therapy: N/A 3. Pain Management: Continue Tylenol as needed  Chronic left foot pain:   Does not appear to limit therapies   Voltaren gel to left ankle    Lidoderm patch ordered for left foot, with some improvement  Appears controlled with meds and distraction on 2/28 4. Mood: Provide emotional support             -antipsychotic agents: N/A 5. Neuropsych: This patient is not fully capable of making decisions on her own behalf.  Ritalin increased to 5 mg twice daily 2/12 with improvement 6. Skin/Wound Care: Routine skin checks 7. Fluids/Electrolytes/Nutrition: Routine in and outs 8.  Diabetes mellitus type II with hyperglycemia.  Hemoglobin A1c 6.2.  Continue Glucophage 1000 mg daily.     CBG (last 3)  Recent Labs    07/15/20 2055 07/16/20 0632 07/16/20 1136  GLUCAP 98 107* 112*   Relatively controlled on 2/28 9.   Hypertension.   HCTZ 25 mg daily, decreased to 12.5 on 2/10, DC'd on 2/15  Relatively controlled on 2/28 10.  History of kidney stones with stent placement 2017 11. Obesity (BMI 36.48): Provide dietary counseling 12.  Hypokalemia  Potassium 3.2 on 2/28  Supplemented x2 days on 2/11-2/12, again on 2/28-3/1  Continue to monitor  LOS: 21 days A FACE TO FACE EVALUATION WAS PERFORMED  Lana Flaim Lorie Phenix 07/16/2020, 1:25 PM

## 2020-07-16 NOTE — Progress Notes (Signed)
Pt stated, " I feel like my butt is raw." RN assessed bottom and found a small open tear on the inside of the left buttock. RN measured tear and put barrier cream with foam dx. Pt stable at this time. RN will continue to monitor.

## 2020-07-16 NOTE — Progress Notes (Signed)
Occupational Therapy Session Note  Patient Details  Name: KRYSTINA STRIETER MRN: [id number] Date of Birth: [birth year]  Today's Date: 07/16/2020 OT Individual Time: 1350-1435 OT Individual Time Calculation (min): 45 min    Short Term Goals: Week 3:  OT Short Term Goal 1 (Week 3): STG = LTGs due to remaining LOS  Skilled Therapeutic Interventions/Progress Updates:    Treatment session with focus on self-care retraining, functional transfers, and L attention.  Pt received upright in bed expressing desire to take a shower.  Pt completed bed mobility with min cues to attend to task, as pt initially then reaching for remote to turn on TV.  Pt completed bed mobility with supervision and completed sit > stand with increased initiation and supervision.  Pt ambulated to toilet with RW with supervision.  Pt able to doff pants prior to toileting with increased time.  Pt then transferred in to walk-in shower with mod cues for attention to L when sidestepping over 5" threshold in to shower.  Pt required increased time due to decreased processing and L inattention, but able to complete with CGA and cues.  Pt completed all bathing at sit > stand level with supervision/cues to wash LLE.  Pt completed transfer out of shower with improved sequencing due to transferring to R.  Pt ambulated to EOB and completed dressing from EOB.  Pt able to fasten bra and don bra with supervision.  Pt able to verbalize need to thread LLE first but due to decreased attention and processing continues to require assistance to thread LLE.  Pt remained seated EOB with nurse tech to provide supervision/assist with completing remainder of dressing due to time constraints.    Therapy Documentation Precautions:  Precautions Precautions: Fall Restrictions Weight Bearing Restrictions: No General:   Vital Signs: Therapy Vitals Temp: 98.2 F (36.8 C) Temp Source: Oral Pulse Rate: 80 Resp: 16 BP: 122/72 Patient Position (if  appropriate): Lying Oxygen Therapy SpO2: 96 % O2 Device: Room Air Pain: Pain Assessment Pain Score: 0-No pain   Therapy/Group: Individual Therapy  Simonne Come 07/16/2020, 3:23 PM

## 2020-07-16 NOTE — Progress Notes (Signed)
Speech Language Pathology Daily Session Note  Patient Details  Name: Cheryl Ortiz MRN: [id number] Date of Birth: 04-Jul-1950  Today's Date: 07/16/2020 SLP Individual Time: 8675-4492 SLP Individual Time Calculation (min): 44 min  Short Term Goals: Week 3: SLP Short Term Goal 1 (Week 3): STG=LTG due to extend length of stay (execpted discharge 3/3)  Skilled Therapeutic Interventions:Skilled ST services focused on cognitive skills. Pt was waiting for ST to consume breakfast due to full supervision. SLP repositioned pt in bed and aiding in initiation, sustained attention to self-feeding with strict time limit of 5 minutes (to consume desired cereal and applesauce) and elimination of external distraction. Pt demonstrated Mod I and ST reduced supervision to intermittent with heavy set up and elimination of distraction including instruction to put cell phone away during meals. Pt was able to list acute deficits of attention and increasing vocal intensity, however continued to require mod A verbal cues to increase vocal intensity in conversation that only lasted for 1-2 exchanges. SLP administered subsection of Burns Right Hemisphere Inventory to aid in awareness of language deficits. Pt scored within [facility] in visual spatial skills, inferences and metaphorical language however demonstrated moderate-severe deficits in expressive and receptive prosody. SLP provided education pertaining to right hemisphere deficits impacting nonverbal communication such as reading emotions, pt stated "no I have always been good at that." Pt was left in room with call bell within reach and bed alarm set. SLP recommends to continue skilled services.     Pain Pain Assessment Pain Score: 0-No pain  Therapy/Group: Individual Therapy  Cheryl Ortiz  [facility] 07/16/2020, 11:46 AM

## 2020-07-16 NOTE — Plan of Care (Signed)
  Problem: RH Balance Goal: LTG Patient will maintain dynamic sitting balance (PT) Description: LTG:  Patient will maintain dynamic sitting balance with assistance during mobility activities (PT) Flowsheets (Taken 07/16/2020 0753) LTG: Pt will maintain dynamic sitting balance during mobility activities with:: (upgraded due to improved balance) Independent with assistive device  Note: upgraded due to improved balance Goal: LTG Patient will maintain dynamic standing balance (PT) Description: LTG:  Patient will maintain dynamic standing balance with assistance during mobility activities (PT) Flowsheets (Taken 07/16/2020 0753) LTG: Pt will maintain dynamic standing balance during mobility activities with:: (upgraded due to improved balance, posture, and strength) Supervision/Verbal cueing Note: upgraded due to improved balance, posture, and strength   Problem: RH Bed to Chair Transfers Goal: LTG Patient will perform bed/chair transfers w/assist (PT) Description: LTG: Patient will perform bed to chair transfers with assistance (PT). Flowsheets (Taken 07/16/2020 0753) LTG: Pt will perform Bed to Chair Transfers with assistance level: (upgraded due to improved balance, posture, and strength) Supervision/Verbal cueing Note: upgraded due to improved balance, posture, and strength   Problem: RH Furniture Transfers Goal: LTG Patient will perform furniture transfers w/assist (OT/PT) Description: LTG: Patient will perform furniture transfers  with assistance (OT/PT). Flowsheets (Taken 07/16/2020 0753) LTG: Pt will perform furniture transfers with assist:: (upgraded due to improved balance, posture, and strength) Supervision/Verbal cueing Note: upgraded due to improved balance, posture, and strength

## 2020-07-16 NOTE — Plan of Care (Signed)
  Problem: RH Expression Communication Goal: LTG Patient will increase speech intelligibility (SLP) Description: LTG: Patient will increase speech intelligibility at word/phrase/conversation level with cues, % of the time (SLP) Flowsheets (Taken 07/16/2020 1144) LTG: Patient will increase speech intelligibility (SLP): (downgraded due to poor awareness and carryover) Moderate Assistance - Patient 50 - 74% Note: Downgrade due to poor awareness and carryover

## 2020-07-16 NOTE — Plan of Care (Signed)
  Problem: RH Light Housekeeping Goal: LTG Patient will perform light housekeeping w/assist (OT) Description: LTG: Patient will perform light housekeeping with assistance, with/without cues (OT). Outcome: Not Applicable Flowsheets (Taken 07/16/2020 1423) LTG: Pt will perform light housekeeping with assistance level of: (d/c) -- Note: D/c as not a focus at this time due to L inattention and decreased initiation, sequencing, awareness   Problem: RH Dressing Goal: LTG Patient will perform lower body dressing w/assist (OT) Description: LTG: Patient will perform lower body dressing with assist, with/without cues in positioning using equipment (OT) Flowsheets (Taken 07/16/2020 1423) LTG: Pt will perform lower body dressing with assistance level of: Minimal Assistance - Patient > 75% Note: Downgraded due to L inattention, decreased initiation, sequencing, and awareness   Problem: RH Tub/Shower Transfers Goal: LTG Patient will perform tub/shower transfers w/assist (OT) Description: LTG: Patient will perform tub/shower transfers with assist, with/without cues using equipment (OT) Flowsheets (Taken 07/16/2020 1423) LTG: Pt will perform tub/shower stall transfers with assistance level of: Contact Guard/Touching assist Note: Downgraded due to requiring increased cues for safety due to L inattention, initiation, and awareness

## 2020-07-16 NOTE — Plan of Care (Signed)
  Problem: RH Wheelchair Mobility Goal: LTG Patient will propel w/c in controlled environment (PT) Description: LTG: Patient will propel wheelchair in controlled environment, # of feet with assist (PT) Outcome: Not Applicable Flowsheets (Taken 07/16/2020 0755) LTG: Pt will propel w/c in controlled environ  assist needed:: (D/C) -- Note: D/C

## 2020-07-16 NOTE — Progress Notes (Signed)
Physical Therapy Session Note  Patient Details  Name: Cheryl Ortiz MRN: [id number] Date of Birth: 07/27/1950  Today's Date: 07/16/2020 PT Individual Time: 1000-1108 PT Individual Time Calculation (min): 68 min   Short Term Goals: Week 2:  PT Short Term Goal 1 (Week 2): pt will consistently complete bed mobility with modA PT Short Term Goal 1 - Progress (Week 2): Met PT Short Term Goal 2 (Week 2): Pt will complete bed<>chair transfers with modA and LRAD PT Short Term Goal 2 - Progress (Week 2): Met PT Short Term Goal 3 (Week 2): Pt will ambulate 150ft with minA and LRAD PT Short Term Goal 3 - Progress (Week 2): Met PT Short Term Goal 4 (Week 2): Pt will initiate stair training PT Short Term Goal 4 - Progress (Week 2): Met Week 3:  PT Short Term Goal 1 (Week 3): STG=LTG due to LOS  Skilled Therapeutic Interventions/Progress Updates:   Received pt semi-reclined in bed talking on phone, pt required increased time and cues to hang op phone and participate in therapy. Pt agreeable to therapy and denied any pain during session. Session with emphasis on functional mobility/transfers, generalized strengthening, dynamic standing balance/coordination, NMR, gait training, toileting, stair navigation, and improved activity tolerance. Pt reported urge to toilet and transferred semi-reclined<>sitting EOB with supervision and donned shoes sitting EOB with max A as pt reported feeling dizzy when bending forward. Pt ambulated 10ft with RW and close supervision to bathroom and able to manage clothing with close supervision. Pt doffed brief and requested clean one (even though current one was dry and clean). Donned new brief with total A and pt able to void and perform peri-care with supervision with min cues for attention. Pt stood at sink and washed hands and applied lotions with supervision. Pt transported downstairs to 4M therapy gym in WC total A for time management purposes and navigated 12 steps with 2  rails and CGA alternating ascending and descending with a step to and step through pattern. Pt distracted looking out the window requiring min cues for attention. Pt performed the following activities standing on [employer] with emphasis on dynamic standing balance, weight shifting, attention, NMR, and coordination: -postural stability training for 2 minutes on level static alternating using BUE and no UE support with close supervision for balance. -limits of stability training for 2 minutes on level static with BUE support and CGA with cues for foot placement and weight shifting technique Pt continues to demonstrate delayed initiation and frequently requires repetition when instructed to perform tasks. Pt transported to dayroom in WC total A and ambulated 180ft with RW and close supervision. Pt with continued decreased cadence but overall demonstrates mild improvements in speed. Pt required cues for L attention as pt frequently running into obstacles on L side and easily distracted by other people in hallway stopping to watch. Pt with questions regarding returning to driving. Encouraged pt to speak further with MD but informed pt of concerns with L attention, initiation, and delayed reaction times as safety concerns; pt verbalized understanding. Pt transported back to room in WC total A and ambulated 10ft without AD and CGA to recliner. Concluded session with pt sitting in recliner, needs within reach, and seatbelt alarm on.   Therapy Documentation Precautions:  Precautions Precautions: Fall Restrictions Weight Bearing Restrictions: No  Therapy/Group: Individual Therapy Anna M Johnson  Anna Johnson PT, DPT   07/16/2020, 7:29 AM  

## 2020-07-17 LAB — GLUCOSE, CAPILLARY
Glucose-Capillary: 107 mg/dL — ABNORMAL HIGH (ref 70–99)
Glucose-Capillary: 101 mg/dL — ABNORMAL HIGH (ref 70–99)
Glucose-Capillary: 85 mg/dL (ref 70–99)
Glucose-Capillary: 168 mg/dL — ABNORMAL HIGH (ref 70–99)

## 2020-07-17 NOTE — Progress Notes (Signed)
[place] PHYSICAL MEDICINE & REHABILITATION PROGRESS NOTE  Subjective/Complaints: Patient seen laying in bed this AM.  She states she slept well overnight.  She notes difficulty swallowing potassium supplement.  "It's like swallowing a cow".  She is more alert and engaged.   ROS: Denies CP, SOB, N/V/D  Objective: Vital Signs: Blood pressure 121/70, pulse 70, temperature 98.8 F (37.1 C), temperature source Oral, resp. rate 16, height 5\' 6"  (1.676 m), weight 101.8 kg, SpO2 99 %. No results found. Recent Labs    07/16/20 1127  WBC 5.7  HGB 12.9  HCT 38.8  PLT 335   Recent Labs    07/16/20 1127  NA 138  K 3.2*  CL 105  CO2 22  GLUCOSE 112*  BUN 12  CREATININE 0.87  CALCIUM 9.6    Intake/Output Summary (Last 24 hours) at 07/17/2020 1836 Last data filed at 07/17/2020 1740 Gross per 24 hour  Intake 340 ml  Output -  Net 340 ml     Pressure Injury 07/16/20 Buttocks Left;Posterior;Medial Stage 2 -  Partial thickness loss of dermis presenting as a shallow open injury with a red, pink wound bed without slough. open pink wound (Active)  07/16/20 0344  Location: Buttocks  Location Orientation: Left;Posterior;Medial  Staging: Stage 2 -  Partial thickness loss of dermis presenting as a shallow open injury with a red, pink wound bed without slough.  Wound Description (Comments): open pink wound  Present on Admission: No    Physical Exam: BP 121/70 (BP Location: Left Arm)   Pulse 70   Temp 98.8 F (37.1 C) (Oral)   Resp 16   Ht 5\' 6"  (1.676 m)   Wt 101.8 kg   SpO2 99%   BMI 36.22 kg/m   Constitutional: No distress . Vital signs reviewed. HENT: Normocephalic.  Atraumatic. Eyes: EOMI. No discharge. Cardiovascular: No JVD.  RRR. Respiratory: Normal effort.  No stridor.  Bilateral clear to auscultation. GI: Non-distended.  BS +. Skin: Warm and dry.  Intact. Psych: Normal mood.  Normal behavior. Musc: No edema in extremities.  No tenderness in extremities. Neuro: Alert  and oriented x4 Gradual improvinginsight and awareness Motor: Left upper extremity: 4-4+/5 proximal distal, stable Left lower extremity: Hip flexion, knee extension 4-4+/5, ankle dorsiflexion 4+/5, stable  Assessment/Plan: 1. Functional deficits which require 3+ hours per day of interdisciplinary therapy in a comprehensive inpatient rehab setting.  Physiatrist is providing close team supervision and 24 hour management of active medical problems listed below.  Physiatrist and rehab team continue to assess barriers to discharge/monitor patient progress toward functional and medical goals   Care Tool:  Bathing    Body parts bathed by patient: Left arm,Chest,Abdomen,Front perineal area,Buttocks,Right upper leg,Left upper leg,Face,Right lower leg,Right arm,Left lower leg   Body parts bathed by helper: Left lower leg     Bathing assist Assist Level: Supervision/Verbal cueing     Upper Body Dressing/Undressing Upper body dressing   What is the patient wearing?: Bra,Pull over shirt    Upper body assist Assist Level: Supervision/Verbal cueing    Lower Body Dressing/Undressing Lower body dressing      What is the patient wearing?: Pants,Incontinence brief     Lower body assist Assist for lower body dressing: Minimal Assistance - Patient > 75%     Toileting Toileting    Toileting assist Assist for toileting: Supervision/Verbal cueing     Transfers Chair/bed transfer  Transfers assist     Chair/bed transfer assist level: Supervision/Verbal cueing  Locomotion Ambulation   Ambulation assist      Assist level: Supervision/Verbal cueing Assistive device: Walker-rolling Max distance: 145ft   Walk 10 feet activity   Assist     Assist level: Supervision/Verbal cueing Assistive device: Walker-rolling   Walk 50 feet activity   Assist    Assist level: Supervision/Verbal cueing Assistive device: Walker-rolling    Walk 150 feet activity   Assist  Walk 150 feet activity did not occur: Safety/medical concerns  Assist level: Supervision/Verbal cueing Assistive device: Walker-rolling    Walk 10 feet on uneven surface  activity   Assist Walk 10 feet on uneven surfaces activity did not occur: Safety/medical concerns   Assist level: Minimal Assistance - Patient > 75% Assistive device: Aeronautical engineer Will patient use wheelchair at discharge?: No      Wheelchair assist level: Dependent - Patient 0% Max wheelchair distance: 200    Wheelchair 50 feet with 2 turns activity    Assist        Assist Level: Dependent - Patient 0%   Wheelchair 150 feet activity     Assist      Assist Level: Dependent - Patient 0%   Medical Problem List and Plan: 1.  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  Continue CIR  Repeat stat CT showing unchanged size of hematoma.  No changes per neurology 2.  Antithrombotics: -DVT/anticoagulation: SCDs             -antiplatelet therapy: N/A 3. Pain Management: Continue Tylenol as needed  Chronic left foot pain:   Does not appear to limit therapies   Voltaren gel to left ankle    Lidoderm patch ordered for left foot, with some improvement  Controlled with meds and distraction on 3/1 4. Mood: Provide emotional support             -antipsychotic agents: N/A 5. Neuropsych: This patient is not fully capable of making decisions on her own behalf.  Ritalin increased to 5 mg twice daily 2/12 with improvement 6. Skin/Wound Care: Routine skin checks 7. Fluids/Electrolytes/Nutrition: Routine in and outs 8.  Diabetes mellitus type II with hyperglycemia.  Hemoglobin A1c 6.2.  Continue Glucophage 1000 mg daily.     CBG (last 3)  Recent Labs    07/17/20 0607 07/17/20 1152 07/17/20 1627  GLUCAP 107* 101* 85   Relatively  controlled on 3/1 9.  Hypertension.   HCTZ 25 mg daily, decreased to 12.5 on 2/10, DC'd on 2/15  Relatively controlled on 3/1 10.  History of kidney stones with stent placement 2017 11. Obesity (BMI 36.48): Provide dietary counseling 12.  Hypokalemia  Potassium 3.2 on 2/28, labs ordered for tomorrow  Supplemented x2 days on 2/11-2/12, again on 2/28-3/1  Continue to monitor  LOS: 22 days A FACE TO [place] 07/17/2020, 6:36 PM

## 2020-07-17 NOTE — Discharge Instructions (Signed)
Inpatient Rehab Discharge Instructions  Cheryl Ortiz Discharge date and time: No discharge date for patient encounter.   Activities/Precautions/ Functional Status: Activity: activity as tolerated Diet: diabetic diet Wound Care: Routine skin checks Functional status:  ___ No restrictions     ___ Walk up steps independently ___ 24/7 supervision/assistance   ___ Walk up steps with assistance ___ Intermittent supervision/assistance  ___ Bathe/dress independently ___ Walk with walker     _x__ Bathe/dress with assistance ___ Walk Independently    ___ Shower independently ___ Walk with assistance    ___ Shower with assistance ___ No alcohol     ___ Return to work/school ________  Special Instructions: No driving smoking or alcohol  No aspirin products until follow-up with neurology services    COMMUNITY REFERRALS UPON DISCHARGE:    Outpatient: PT, OT, SP             Agency:[facility]    Phone:[phone]              Appointment Date/Time:CALL DAUGHTER TO SET UP [place] Equipment/[facility] [place]                                                 Agency/Supplier:[facility]  [phone]   My questions have been answered and I understand these instructions. I will adhere to these goals and the provided educational materials after my discharge from the hospital.  Patient/Caregiver Signature _______________________________ Date __________  Clinician Signature _______________________________________ Date __________  Please bring this form and your medication list with you to all your follow-up doctor's appointments.

## 2020-07-17 NOTE — Progress Notes (Signed)
Patient ID: Cheryl Ortiz, female   DOB: [birth year], 70 y.o.   MRN: [id number] Met with pt and daughter who was here for education it went well and feels ready for discharge on Thursday. Have ordered rolling walker and made referral to OP at [facility] for rehab. Daughter aware will contact her to set up appointments. Daughter will check to make sure can borrow a tub seat from another family member. Aware pt will need 24/7 care at discharge.

## 2020-07-17 NOTE — Progress Notes (Signed)
Speech Language Pathology Daily Session Note  Patient Details  Name: Cheryl Ortiz MRN: [id number] Date of Birth: 1950-11-06  Today's Date: 07/17/2020 SLP Individual Time: 1003-1045 SLP Individual Time Calculation (min): 42 min  Short Term Goals: Week 3: SLP Short Term Goal 1 (Week 3): STG=LTG due to extend length of stay (execpted discharge 3/3)  Skilled Therapeutic Interventions:Skilled ST services focused on education and cognitive skills. SLP provided education to pt and pt's daughter April, providing Burn Right Hemisphere assessment noting deficits in vocal tone, pragmatic and nonverbal communication, reduced awareness of deficits/errors and problem solving. SLP facilitated mildly complex problem solving and pragmatic communication in turn tasking during novel card task (Blink), pt required min A verbal cues.All questions were answered to satisfaction. Pt was left in room with April and bed alarm set. SLP recommends to continue skilled services.      Pain Pain Assessment Pain Score: 0-No pain  Therapy/Group: Individual Therapy  Rockie Schnoor  [facility] 07/17/2020, 4:47 PM

## 2020-07-17 NOTE — Progress Notes (Signed)
Occupational Therapy Session Note  Patient Details  Name: Cheryl Ortiz MRN: [id number] Date of Birth: Jan 08, 1951  Today's Date: 07/17/2020 OT Individual Time: 9093-1121 and 1305-1403 OT Individual Time Calculation (min): 28 min and 58 min   Short Term Goals: Week 3:  OT Short Term Goal 1 (Week 3): STG = LTGs due to remaining LOS  Skilled Therapeutic Interventions/Progress Updates:    1) Treatment session with focus on hands on family education in preparation for d/c home Thurs.  Pt received semi-reclined in bed with daughter, April, present.  Educated on L inattention, impaired initiation, and delayed processing.  Educated on importance of decreasing distractions to increase pt attention and success.  Pt completed bed mobility from flat bed with increased time and supervision.  Ambulated to toilet with RW with supervision.  Therapist encouraging pt's daughter to ambulate behind and bias to pt's L to provide cues for increased L attention during mobility.  Pt completed toileting task with supervision.  Returned to recliner ambulating with RW with supervision.  Discussed L attention during bathing and eating tasks, encouraging daughter to provide verbal cues to facilitate increased attention and scanning to L prior to assisting.  Pt remained upright in recliner with daughter present and PT arriving.  2) Treatment session with focus on self-care retraining, functional transfers, and L attention during mobility and self-care tasks.  Pt received supine in bed expressing desire to shower this session.  Pt completed bed mobility supervision and ambulated to bathroom with RW with supervision.  Pt reports need to toilet prior to shower.  Pt able to complete clothing management and hygiene with distant supervision.  Pt completed shower transfer with mod cues for L attention and processing transfer in to shower.  Pt completed all bathing at sit > stand level with supervision, utilizing LUE with bathing.  Pt  ambulated back to EOB supervision with RW.  Pt completed dressing seated EOB with supervision and min cues for sequencing and attention to L with LB dressing.  Pt returned to supine with supervision and left semi-reclined in bed with all needs in reach.  Therapy Documentation Precautions:  Precautions Precautions: Fall Restrictions Weight Bearing Restrictions: No Pain: Pain Assessment Pain Scale: 0-10 Pain Score: 0-No pain   Therapy/Group: Individual Therapy  Simonne Come 07/17/2020, 12:21 PM

## 2020-07-17 NOTE — Progress Notes (Signed)
Speech Language Pathology Daily Session Note  Patient Details  Name: Cheryl Ortiz MRN: [id number] Date of Birth: 01-26-1951   LATE ENTRY FOR 07/13/2020 SESSION  Today's Date: 07/17/2020    SLP Individual Time: 1400-1430.    Short Term Goals: Week 3: SLP Short Term Goal 1 (Week 3): STG=LTG due to extend length of stay (execpted discharge 3/3)  Skilled Therapeutic Interventions:   Patient seen for skilled ST session in PM. When SLP entered room, patient was on her phone sitting up on edge of bed. She opened the [facility] app on her phone and was looking at some items she said she was going to buy for herself for her birthday. SLP provided multiple cues to patient as she started to actively purchase an item but she insisted she had been intending to purchase this and it was not an impulse. Patient is more attentive overall and is able to navigate her phone with much better accuracy, however she continues to be easily distracted, tapping icons to open apps that she then quickly closes. SLP provided frequent verbal cues to redirect her. She was able to maintain an adequate vocal intensity during unstructured conversation and although she is making better eye contact and awareness to conversational partner, she continues with monotone voice. Patient continues to benefit from skilled SLP intervention to maximize cognitive-linguistic abilities prior to discharge.  Pain  No c/o pain  Therapy/Group: Individual Therapy  Sonia Baller, MA, CCC-SLP Speech Therapy

## 2020-07-17 NOTE — Plan of Care (Signed)
  Problem: Consults Goal: RH STROKE PATIENT EDUCATION Description: See Patient Education module for education specifics  Outcome: Progressing   Problem: RH SKIN INTEGRITY Goal: RH STG MAINTAIN SKIN INTEGRITY WITH ASSISTANCE Description: STG Maintain Skin Integrity With Supervision Assistance. Outcome: Progressing   Problem: RH SAFETY Goal: RH STG ADHERE TO SAFETY PRECAUTIONS W/ASSISTANCE/DEVICE Description: STG Adhere to Safety Precautions With supervisionAssistance/Device. Outcome: Progressing   Problem: RH PAIN MANAGEMENT Goal: RH STG PAIN MANAGED AT OR BELOW PT'S PAIN GOAL Description: <3 on a 0-10 pain scale. Outcome: Progressing   Problem: RH KNOWLEDGE DEFICIT Goal: RH STG INCREASE KNOWLEDGE OF HYPERTENSION Description: Patient will be able to demonstrate knowledge of BP control and parameters as set by the MD, medication management, dietary management, signs and symptoms of HTN with educational materials and handouts provided by staff. Outcome: Progressing Goal: RH STG INCREASE KNOWLEGDE OF HYPERLIPIDEMIA Description: Patient will be able to demonstrate knowledge of lab values associated with HLD, medication management, exercise recommendations with educational materials and handouts provided by staff. Outcome: Progressing Goal: RH STG INCREASE KNOWLEDGE OF STROKE PROPHYLAXIS Description: Patient will be able to demonstrate knowledge of medications used to aid in prevention of future strokes with educational materials and handouts provided by staff. Outcome: Progressing

## 2020-07-17 NOTE — Progress Notes (Signed)
Physical Therapy Session Note  Patient Details  Name: Cheryl Ortiz MRN: [id number] Date of Birth: February 08, 1951  Today's Date: 07/17/2020 PT Individual Time: 2956-2130 PT Individual Time Calculation (min): 38 min   Short Term Goals: Week 2:  PT Short Term Goal 1 (Week 2): pt will consistently complete bed mobility with modA PT Short Term Goal 1 - Progress (Week 2): Met PT Short Term Goal 2 (Week 2): Pt will complete bed<>chair transfers with modA and LRAD PT Short Term Goal 2 - Progress (Week 2): Met PT Short Term Goal 3 (Week 2): Pt will ambulate 151f with minA and LRAD PT Short Term Goal 3 - Progress (Week 2): Met PT Short Term Goal 4 (Week 2): Pt will initiate stair training PT Short Term Goal 4 - Progress (Week 2): Met Week 3:  PT Short Term Goal 1 (Week 3): STG=LTG due to LOS  Skilled Therapeutic Interventions/Progress Updates:   Received pt sitting in recliner finishing OT family ed with pt's daughter, April present at bedside. Session with emphasis on discharge planning, functional mobility/transfers, generalized strengthening, dynamic standing balance/coordination, ambulation, stair navigation, simulated car transfers, and improved activity tolerance. Pt ambulated 152fwithout AD and CGA to WC with pt stopping along the way at sink distracted by applying lotion. [facility] and ambulated 15046fith RW and close supervision provided by April. Educated April on importance of staying on pt's L side due to inattention as well as cues to provide as pt is easily distracted internally and externally. Pt navigated 1 6in curb with RW and CGA/close supervision (per pt's home set up) provided by April and required cues for technique and RW management. Pt ambulated to staircase and navigated 4 steps with 1 rail and CGA (per April's home set up) provided by April. Therapist educated April on hand placement, technique, and  body mechanics to ensure safety. [facility] and performed ambulatory simulated car transfer with RW and supervision with April providing cues for technique. April demonstrates good understanding of body mechanics, technique, and verbal cues necessary to provide pt to optimize safety. [facility] and ambulated [id number] AD and CGA back to recliner. Concluded session with pt sitting in recliner, needs within reach, and seatbelt alarm on with NT present checking blood glucose levels.   Therapy Documentation Precautions:  Precautions Precautions: Fall Restrictions Weight Bearing Restrictions: No  Therapy/Group: Individual Therapy AnnaAlfonse Alpers DPT   07/17/2020, 7:30 AM

## 2020-07-17 NOTE — Discharge Summary (Signed)
Physician Discharge Summary  Patient ID: Cheryl Ortiz MRN: [id number] DOB/AGE: Jun 03, 1950 70 y.o.  Admit date: 06/25/2020 Discharge date: 07/19/2020  Discharge Diagnoses:  Principal Problem:   ICH (intracerebral hemorrhage) ([place]) Active Problems:   Hypokalemia   Essential hypertension   Controlled type 2 diabetes mellitus with hyperglycemia, without long-term current use of insulin (HCC)   History of hypertension   Hypotension due to drugs   Labile blood glucose   Attention and concentration deficit   Left foot pain   Labile blood pressure   Pressure injury of skin Obesity History of kidney stones   Discharged Condition: Stable  Significant Diagnostic Studies: CT Angio Head W or Wo Contrast  Result Date: 06/23/2020 CLINICAL DATA:  Right frontal hemorrhage. EXAM: CT ANGIOGRAPHY HEAD TECHNIQUE: Multidetector CT imaging of the head was performed using the standard protocol during bolus administration of intravenous contrast. Multiplanar CT image reconstructions and MIPs were obtained to evaluate the vascular anatomy. CONTRAST:  110mL OMNIPAQUE IOHEXOL 350 MG/ML SOLN COMPARISON:  1 hour ago. FINDINGS: CT HEAD Brain: No change. Intraparenchymal hemorrhage in the right frontal lobe with mild surrounding edema. No additional bleeding. The remainder the brain appears normal except for some chronic small vessel type ischemic changes in the basal ganglia and external capsule regions. No hydrocephalus. No extra-axial collection. Vascular: See results below. Skull: Negative Sinuses: Clear Orbits: Normal CTA HEAD Anterior circulation: Both internal carotid arteries are patent through the skull base and siphon regions. There is ordinary siphon atherosclerotic calcification but no stenosis. The anterior and middle cerebral vessels are patent. No proximal stenosis. No large or medium vessel occlusion. No sign of high flow vascular malformation in the region of the right frontal hemorrhage. Posterior  circulation: Both vertebral arteries widely patent to the basilar. No basilar stenosis. Posterior circulation branch vessels are normal. Venous sinuses: Patent and normal. No sign of venous thrombosis. Cortical venous thrombosis can be difficult to see. Anatomic variants: None IMPRESSION: 1. No sign of high flow vascular malformation in the region of the right frontal hemorrhage. No sign of venous thrombosis. Cortical venous thrombosis can be difficult to see by CT. 2. No change in the appearance of the right frontal lobe intraparenchymal hemorrhage with mild surrounding edema. No mass effect or shift. Electronically Signed   By: Nelson Chimes M.D.   On: 06/23/2020 03:37   CT HEAD WO CONTRAST  Result Date: 06/28/2020 CLINICAL DATA:  Intracranial hemorrhage. EXAM: CT HEAD WITHOUT CONTRAST TECHNIQUE: Contiguous axial images were obtained from the base of the skull through the vertex without intravenous contrast. COMPARISON:  CT head 06/23/2020. FINDINGS: Brain: High-density hematoma in the right frontal lobe is unchanged in size. There is increased surrounding edema. No midline shift. No new area of hemorrhage. Ventricle size normal. No midline shift. Small chronic infarct right cerebellum Vascular: Negative for hyperdense vessel Skull: Negative Sinuses/Orbits: Paranasal sinuses clear.  Negative orbit Other: None IMPRESSION: Moderate large right frontal hematoma unchanged in size. Increased surrounding edema and local mass-effect. No midline shift. Electronically Signed   By: Franchot Gallo M.D.   On: 06/28/2020 12:34   CT Head Wo Contrast  Result Date: 06/23/2020 CLINICAL DATA:  Altered mental status EXAM: CT HEAD WITHOUT CONTRAST TECHNIQUE: Contiguous axial images were obtained from the base of the skull through the vertex without intravenous contrast. COMPARISON:  09/26/2009 FINDINGS: Brain: Area of intracerebral hemorrhage seen within the anteromedial right frontal lobe measures 5.8 x 4.3 x 2.6 cm. Small  amount of blood is seen to  the left of the falx which could be within the left frontal lobe or subarachnoid blood in a left frontal sulcus. No mass effect or midline shift. No hydrocephalus. Small old right cerebellar infarct. Vascular: No hyperdense vessel or unexpected calcification. Skull: No acute calvarial abnormality. Sinuses/Orbits: Visualized paranasal sinuses and mastoids clear. Orbital soft tissues unremarkable. Other: None IMPRESSION: Area of hemorrhage within the anteromedial right frontal lobe. Adjacent small amount of blood to the left side of the falx could be within the left frontal lobe or subarachnoid blood. No mass effect or midline shift. Critical Value/emergent results were called by telephone at the time of interpretation on 06/23/2020 at 2:10 am to provider Dr. Waverly Ferrari, Who verbally acknowledged these results. Electronically Signed   By: Rolm Baptise M.D.   On: 06/23/2020 02:12   CT Angio Neck W and/or Wo Contrast  Result Date: 06/23/2020 CLINICAL DATA:  Right frontal hemorrhage EXAM: CT ANGIOGRAPHY NECK TECHNIQUE: Multidetector CT imaging of the neck was performed using the standard protocol during bolus administration of intravenous contrast. Multiplanar CT image reconstructions and MIPs were obtained to evaluate the vascular anatomy. Carotid stenosis measurements (when applicable) are obtained utilizing NASCET criteria, using the distal internal carotid diameter as the denominator. CONTRAST:  148mL OMNIPAQUE IOHEXOL 350 MG/ML SOLN COMPARISON:  1 hour ago FINDINGS: Aortic arch: Aortic atherosclerosis. Right carotid system: Common carotid artery widely patent to the bifurcation. Minimal calcified plaque at the ICA bulb but no stenosis. Cervical ICA widely patent. Left carotid system: Common carotid artery widely patent to the bifurcation. No plaque at the bifurcation or ICA bulb. Cervical ICA widely patent. Vertebral arteries: Both vertebral artery origins are patent. Both vertebral arteries  appear normal through the cervical region to the foramen magnum. Skeleton: Ordinary cervical spondylosis. Other neck: No neck mass or lymphadenopathy. Upper chest: Negative IMPRESSION: 1. Minimal calcified plaque at the right ICA bulb but no stenosis. 2. No left carotid bifurcation disease. 3. Both vertebral arteries appear normal through the cervical region to the foramen magnum. 4. Aortic atherosclerosis. Aortic Atherosclerosis (ICD10-I70.0). Electronically Signed   By: Nelson Chimes M.D.   On: 06/23/2020 03:33   MR BRAIN W WO CONTRAST  Result Date: 06/23/2020 CLINICAL DATA:  Intracranial hemorrhage. EXAM: MRI HEAD WITHOUT AND WITH CONTRAST TECHNIQUE: Multiplanar, multiecho pulse sequences of the brain and surrounding structures were obtained without and with intravenous contrast. CONTRAST:  80mL GADAVIST GADOBUTROL 1 MMOL/ML IV SOLN COMPARISON:  CT imaging from the same day FINDINGS: Brain: Redemonstrated intraparenchymal hemorrhage in the right frontal lobe with smaller hemorrhage in the left frontal lobe and small amount of hemorrhage along the anterior falx. The size of the hemorrhage appears similar when comparing across modalities. There may be a small amount of nearby right frontal subarachnoid hemorrhage. There is no surrounding restricted diffusion to suggest hemorrhagic transformation of infarct. No visible mass lesion, although acute blood products limit evaluation. There is wispy enhancement in the left frontal lobe which is not masslike. The adjacent superior sagittal sinus appears patent. Mild surrounding edema. Minimal leftward midline shift at the site of hemorrhage, similar to prior. Remote right cerebellar lacunar infarct. No hydrocephalus. Basal cisterns are patent. Additional scattered T2/FLAIR hyperintensities within the white matter, most likely related to chronic microvascular ischemic disease. Vascular: Major arterial flow voids are maintained at the skull base. Skull and upper cervical  spine: Normal marrow signal. Sinuses/Orbits: Sinuses are largely clear.  Unremarkable orbits. Other: Trace right mastoid effusion. IMPRESSION: 1. Right frontal (and smaller left frontal) intraparenchymal  hemorrhages, likely similar in size when comparing across modalities. Small volume of adjacent hemorrhage along the falx and suspected trace right frontal subarachnoid hemorrhage. No surrounding restricted diffusion to suggest hemorrhagic transformation of infarct. No visible mass lesion, although acute blood products limit evaluation. A follow-up MRI after resolution of hemorrhage could further evaluate for underlying abnormality. 2. 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 Electronically Signed   By: Margaretha Sheffield MD   On: 06/23/2020 13:28   ECHOCARDIOGRAM COMPLETE  Result Date: 06/24/2020    ECHOCARDIOGRAM REPORT   Patient Name:   Valentina Gu Date of Exam: 06/24/2020 Medical Rec #:  [id number]          Height:       66.0 in Accession #:    [id number]         Weight:       226.0 lb Date of Birth:  06-Feb-1951          BSA:          2.106 m Patient Age:    48 years           BP:           124/74 mmHg Patient Gender: F                  HR:           79 bpm. Exam Location:  Inpatient Procedure: 2D Echo, Cardiac Doppler and Color Doppler Indications:    Stroke  History:        Patient has no prior history of Echocardiogram examinations.                 Risk Factors:Dyslipidemia, Hypertension, Diabetes and Former                 Smoker.  Sonographer:    Clayton Lefort RDCS (AE) Referring Phys: [id number] [place]  1. Left ventricular ejection fraction, by estimation, is 60 to 65%. The left ventricle has normal function. The left ventricle has no regional wall motion abnormalities. There is moderate concentric left ventricular hypertrophy. Left ventricular diastolic parameters are consistent with Grade II diastolic dysfunction (pseudonormalization).  2. Right  ventricular systolic function is normal. The right ventricular size is normal.  3. The mitral valve is normal in structure. Mild mitral valve regurgitation. No evidence of mitral stenosis.  4. The aortic valve is normal in structure. Aortic valve regurgitation is not visualized. No aortic stenosis is present.  5. The inferior vena cava is normal in size with greater than 50% respiratory variability, suggesting right atrial pressure of 3 mmHg. Conclusion(s)/Recommendation(s): No intracardiac source of embolism detected on this transthoracic study. A transesophageal echocardiogram is recommended to exclude cardiac source of embolism if clinically indicated. FINDINGS  Left Ventricle: Left ventricular ejection fraction, by estimation, is 60 to 65%. The left ventricle has normal function. The left ventricle has no regional wall motion abnormalities. The left ventricular internal cavity size was normal in size. There is  moderate concentric left ventricular hypertrophy. Left ventricular diastolic parameters are consistent with Grade II diastolic dysfunction (pseudonormalization). Right Ventricle: The right ventricular size is normal. No increase in right ventricular wall thickness. Right ventricular systolic function is normal. Left Atrium: Left atrial size was normal in size. Right Atrium: Right atrial size was normal in size. Pericardium: There is no evidence of pericardial effusion. Mitral Valve: The mitral valve is normal in structure. Mild mitral valve regurgitation. No evidence of  mitral valve stenosis. MV peak gradient, 3.9 mmHg. The mean mitral valve gradient is 1.0 mmHg. Tricuspid Valve: The tricuspid valve is normal in structure. Tricuspid valve regurgitation is not demonstrated. No evidence of tricuspid stenosis. Aortic Valve: The aortic valve is normal in structure. Aortic valve regurgitation is not visualized. No aortic stenosis is present. Aortic valve mean gradient measures 4.0 mmHg. Aortic valve peak  gradient measures 6.9 mmHg. Aortic valve area, by VTI measures 2.45 cm. Pulmonic Valve: The pulmonic valve was normal in structure. Pulmonic valve regurgitation is not visualized. No evidence of pulmonic stenosis. Aorta: The aortic root is normal in size and structure. Venous: The inferior vena cava is normal in size with greater than 50% respiratory variability, suggesting right atrial pressure of 3 mmHg. IAS/Shunts: No atrial level shunt detected by color flow Doppler.  LEFT VENTRICLE PLAX 2D LVIDd:         4.20 cm  Diastology LVIDs:         2.70 cm  LV e' medial:    6.74 cm/s LV PW:         1.30 cm  LV E/e' medial:  9.0 LV IVS:        1.40 cm  LV e' lateral:   9.03 cm/s LVOT diam:     2.00 cm  LV E/e' lateral: 6.7 LV SV:         63 LV SV Index:   30 LVOT Area:     3.14 cm  RIGHT VENTRICLE            IVC RV Basal diam:  2.70 cm    IVC diam: 2.30 cm RV S prime:     9.30 cm/s TAPSE (M-mode): 1.9 cm LEFT ATRIUM             Index       RIGHT ATRIUM           Index LA diam:        3.90 cm 1.85 cm/m  RA Area:     14.70 cm LA Vol (A2C):   60.2 ml 28.58 ml/m RA Volume:   36.90 ml  17.52 ml/m LA Vol (A4C):   54.8 ml 26.02 ml/m LA Biplane Vol: 61.9 ml 29.39 ml/m  AORTIC VALVE AV Area (Vmax):    2.21 cm AV Area (Vmean):   1.94 cm AV Area (VTI):     2.45 cm AV Vmax:           131.00 cm/s AV Vmean:          92.600 cm/s AV VTI:            0.258 m AV Peak Grad:      6.9 mmHg AV Mean Grad:      4.0 mmHg LVOT Vmax:         92.00 cm/s LVOT Vmean:        57.300 cm/s LVOT VTI:          0.201 m LVOT/AV VTI ratio: 0.78  AORTA Ao Root diam: 3.30 cm Ao Asc diam:  3.10 cm MITRAL VALVE MV Area (PHT): 2.69 cm    SHUNTS MV Area VTI:   2.76 cm    Systemic VTI:  0.20 m MV Peak grad:  3.9 mmHg    Systemic Diam: 2.00 cm MV Mean grad:  1.0 mmHg MV Vmax:       0.99 m/s MV Vmean:      48.0 cm/s MV Decel Time: 282 msec MV E velocity: 60.80  cm/s MV A velocity: 75.40 cm/s MV E/A ratio:  0.81 Ena Dawley MD Electronically signed by  Ena Dawley MD Signature Date/Time: 06/24/2020/3:46:30 PM    Final    CT VENOGRAM HEAD  Result Date: 06/23/2020 CLINICAL DATA:  Right frontal hemorrhage EXAM: CT VENOGRAM HEAD TECHNIQUE: Venography technique subsequently utilized. CONTRAST:  130mL OMNIPAQUE IOHEXOL 350 MG/ML SOLN COMPARISON:  Arterial exam. FINDINGS: No visible venous thrombosis either of the sinuses or superficial veins. Superficial venous thrombosis can be difficult to see by CT. IMPRESSION: No visible venous thrombosis. Superficial venous thrombosis can be difficult to see by CT. Electronically Signed   By: Nelson Chimes M.D.   On: 06/23/2020 03:38    Labs:  Basic Metabolic Panel: Recent Labs  Lab 07/16/20 1127 07/18/20 0435  NA 138 142  K 3.2* 4.4  CL 105 110  CO2 22 21*  GLUCOSE 112* 103*  BUN 12 9  CREATININE 0.87 0.70  CALCIUM 9.6 9.4    CBC: Recent Labs  Lab 07/16/20 1127  WBC 5.7  NEUTROABS 3.8  HGB 12.9  HCT 38.8  MCV 87.4  PLT 335    CBG: Recent Labs  Lab 07/17/20 2110 07/18/20 0631 07/18/20 1201 07/18/20 1639 07/18/20 2107  GLUCAP 168* 108* 104* 71 97   Family history.  Mother with hypertension and breast cancer.  Brother with cerebral palsy and hypertension.  Father with hypertension.  Sister with hyperlipidemia.  Denies any colon cancer esophageal cancer or rectal cancer  Brief HPI:   Cheryl Ortiz is a 70 y.o. 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  Per chart review independent prior to admission.  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  1 level home with level entry.  Presented 06/22/2020 with acute onset of left-sided weakness.  Cranial CT scan showed an area of hemorrhage within the anterior medial right frontal lobe.  Adjacent small  amount of blood to the left side of the falx could be within the left frontal lobe or subarachnoid blood.  No mass-effect or midline shift.  CT angiogram of head and neck no sign of high flow vascular malformation in the region of the right frontal hemorrhage.  No signs of venous thrombosis.  MRI of the brain right frontal and smaller left frontal intraparenchymal hemorrhage likely small in size when comparing across modalities.  Small volume of adjacent hemorrhage along the falx and suspected trace right frontal subarachnoid hemorrhage.  No surrounding restricted diffusion to suggest hemorrhagic transformation or infarction.  No visible lesion noted.  Echocardiogram with ejection fraction of 60 to 65% no wall motion abnormalities grade 2 diastolic dysfunction.  Neurology follow-up close monitoring of blood pressure.  Tolerating a regular diet.  Admitted to CIR with left-sided weakness impaired mobility and ADLs.   Hospital Course: CHRISTI WIRICK was admitted to rehab 06/25/2020 for inpatient therapies to consist of PT, ST and OT at least three hours five days a week. Past admission physiatrist, therapy team and rehab RN have worked together to provide customized collaborative inpatient rehab.  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  She would follow-up neurology services.  Latest  follow-up cranial CT scan 06/28/2020 showed moderate large right frontal hematoma unchanged in size no midline shift.  Mood stabilization addition of Ritalin to help patient attend to task and improve focus with good results.  She had some chronic left foot pain responded well to Voltaren gel as well as a Lidoderm patch.  Blood sugars monitored hemoglobin A1c 6.2 Glucophage as directed follow-up outpatient.  Blood pressure somewhat soft HCTZ 25 mg was decreased to 12.5 mg on 06/28/2020 discontinued  07/03/2020 and would need follow-up outpatient monitoring.  History of kidney stone stent placement 2017 no dysuria hematuria noted.  Obesity with BMI 36.48 dietary follow-up.   Blood pressures were monitored on TID basis and soft and monitored  Diabetes has been monitored with ac/hs CBG checks and SSI was use prn for tighter BS control.    Rehab course: During patient's stay in rehab weekly team conferences were held to monitor patient's progress, set goals and discuss barriers to discharge. At admission, patient required moderate assist 8 feet rolling walker max assist supine to sit max assist upper body bathing max is lower body bathing max is upper body dressing max assist lower body dressing  Physical exam.  Blood pressure 122/77 pulse 73 temperature 98 respirations 18 oxygen saturation 98% room air Constitutional.  No acute distress HEENT Head.  Normocephalic and atraumatic Eyes.  Pupils round and reactive to light no discharge without nystagmus Neck.  Supple nontender no JVD without thyromegaly Cardiac regular rate rhythm not extra sounds or murmur heard Abdomen.  Soft nontender positive bowel sounds without rebound Respiratory effort normal no respiratory distress without wheeze Extremities.  No clubbing cyanosis or edema Skin.  Clean and dry Neurologic.  Alert no acute distress right gaze preference she follows commands.  Provides her name and age but poor sentence pacing and some delay in processing. Left upper extremity 3/5 SA, EF, 4/5 EE, handgrip Left lower extremity 4/5  He/She  has had improvement in activity tolerance, balance, postural control as well as ability to compensate for deficits. He/She has had improvement in functional use RUE/LUE  and RLE/LLE as well as improvement in awareness.  Sessions focused on emphasis on functional mobility transfers generalized strengthening dynamic standing balance and coordination.  Transferred semireclined sitting edge of bed with  supervision and donned shoes sitting edge of bed with max assist.  Ambulates short distances rolling walker close supervision to the bathroom able to manage clothing with close supervision.  Doffed briefs and requested simple hygiene care.  Patient stood at sink and washed her hands applied lotion and supervision.  Navigated 12 steps to rails contact-guard assist alternating ascending and descending with a step to and step through pattern.  Patient was easily distracted needed some cues for safety.  Completed bed mobility with minimal cues as well as sit to stand.  She did need some increased time to complete tasks.  Patient demonstrated modified independent and STD reduced supervision to intermittent with heavy set up and elimination of distraction including instruction to put cell phone away during meals.  Patient was able to list acute deficits of attention and increasing vocal intensity.  Full family teaching completed plan discharge to home       Disposition: Discharge to home    Diet: Carb modified  Special Instructions: No driving smoking or alcohol  Medications at discharge 1.  Tylenol as needed 2.  Albuterol inhaler 2 puffs every 4 hours as needed wheezing or shortness of breath 3.  Voltaren gel 2 g  3 times daily to affected area 4.  Lidoderm patch change as directed 5.  Glucophage 1000 mg p.o. daily with breakfast 6.  Ritalin 5 mg p.o. twice daily  30-35 minutes were spent completing discharge summary and discharge planning   Discharge Instructions    Ambulatory referral to Neurology   Complete by: As directed    Follow up with Dr. Jannifer Franklin at [facility] in about 4 weeks. Thanks.   Ambulatory referral to Neurology   Complete by: As directed    An appointment is requested in approximately 4 weeks [place]   Ambulatory referral to Physical Medicine Rehab   Complete by: As directed    Moderate complexity follow-up 1 to 2 weeks [facility]       Follow-up Information    Kathrynn Ducking, MD.  Schedule an appointment as soon as possible for a visit in 4 week(s).   Specialty: Neurology Contact information: [address] [place] [address] [phone]        Jamse Arn, MD Follow up.   Specialty: Physical Medicine and Rehabilitation Why: Office to call for appointment Contact information: [place] [address] [phone]               Signed: Cathlyn Parsons 07/19/2020, 5:22 AM

## 2020-07-18 ENCOUNTER — Other Ambulatory Visit: Payer: Self-pay | Admitting: Physician Assistant

## 2020-07-18 DIAGNOSIS — L8990 Pressure ulcer of unspecified site, unspecified stage: Secondary | ICD-10-CM | POA: Insufficient documentation

## 2020-07-18 LAB — GLUCOSE, CAPILLARY
Glucose-Capillary: 108 mg/dL — ABNORMAL HIGH (ref 70–99)
Glucose-Capillary: 104 mg/dL — ABNORMAL HIGH (ref 70–99)
Glucose-Capillary: 71 mg/dL (ref 70–99)
Glucose-Capillary: 97 mg/dL (ref 70–99)

## 2020-07-18 LAB — BASIC METABOLIC PANEL
Anion gap: 11 (ref 5–15)
BUN: 9 mg/dL (ref 8–23)
CO2: 21 mmol/L — ABNORMAL LOW (ref 22–32)
Calcium: 9.4 mg/dL (ref 8.9–10.3)
Chloride: 110 mmol/L (ref 98–111)
Creatinine, Ser: 0.7 mg/dL (ref 0.44–1.00)
GFR, Estimated: >60 mL/min (ref >60)
Glucose, Bld: 103 mg/dL — ABNORMAL HIGH (ref 70–99)
Potassium: 4.4 mmol/L (ref 3.5–5.1)
Sodium: 142 mmol/L (ref 135–145)

## 2020-07-18 MED ORDER — PANTOPRAZOLE SODIUM 40 MG PO TBEC
40.0000 mg | DELAYED_RELEASE_TABLET | Freq: Every day | ORAL | 0 refills | Status: DC
Start: 2020-07-18 — End: 2020-08-17

## 2020-07-18 MED ORDER — ACETAMINOPHEN 325 MG PO TABS: 650.0000 mg | ORAL_TABLET | ORAL | Status: DC | PRN

## 2020-07-18 MED ORDER — ALBUTEROL SULFATE HFA 108 (90 BASE) MCG/ACT IN AERS: 2.0000 | INHALATION_SPRAY | RESPIRATORY_TRACT | 0 refills | Status: AC | PRN

## 2020-07-18 MED ORDER — METFORMIN HCL 1000 MG PO TABS: 1000.0000 mg | ORAL_TABLET | Freq: Every day | ORAL | 0 refills | Status: DC

## 2020-07-18 MED ORDER — METHYLPHENIDATE HCL 5 MG PO TABS: 5.0000 mg | ORAL_TABLET | Freq: Two times a day (BID) | ORAL | 0 refills | Status: DC

## 2020-07-18 MED ORDER — DICLOFENAC SODIUM 1 % EX GEL: 2.0000 g | Freq: Three times a day (TID) | CUTANEOUS | 0 refills | Status: DC

## 2020-07-18 MED ORDER — LIDOCAINE 5 % EX PTCH
1.0000 | MEDICATED_PATCH | CUTANEOUS | 0 refills | Status: DC
Start: 2020-07-18 — End: 2020-10-25

## 2020-07-18 MED FILL — METHYLPHENIDATE 5 MG TABLET: 5 | 30 days supply | Qty: 60 | Fill #0

## 2020-07-18 MED FILL — DICLOFENAC SODIUM 1 % GEL: 1 | 30 days supply | Qty: 200 | Fill #0

## 2020-07-18 MED FILL — METFORMIN HCL 1000 MG TABS: 1000 | 30 days supply | Qty: 30 | Fill #0

## 2020-07-18 MED FILL — ALBUTEROL SULFATE HFA 108 (: 108 (90 BAS | 16 days supply | Qty: 18 | Fill #0

## 2020-07-18 MED FILL — LIDOCAINE PATCH 5%: 5 | 30 days supply | Qty: 30 | Fill #0

## 2020-07-18 MED FILL — PANTOPRAZOLE SOD DR 40 MG T: 40 | 30 days supply | Qty: 30 | Fill #0

## 2020-07-18 NOTE — Progress Notes (Signed)
Speech Language Pathology Discharge Summary  Patient Details  Name: Cheryl Ortiz MRN: [id number] Date of Birth: 31-Jul-1950  Today's Date: 07/18/2020 SLP Individual Time: 57-1430 SLP Individual Time Calculation (min): 45 min   Skilled Therapeutic Interventions:  Patient seen to address cognitive function goals. Patient required modA cues for selective and alternating attention task, calculating calories using calorie counting book. She would frequently lose focus on task and unable to indicate understanding of or awareness to errors. She was able to describe 3 different ways her life will be different when she goes home, indicating not being able to drive. Patient is being discharged from CIR next date (3/3) and will benefit from [facility].    Patient has met 6 of 6 long term goals.  Patient to discharge at overall Supervision;Min level.  Reasons goals not met: N/A   Clinical Impression/Discharge Summary: Patient was able to achieve all LTG's for speech-language and cognitive functioning. During initial at least two weeks, patient's progress was very slow, however during last approximately week and a half, she has demonstrated significant improvement overall. She is currently speaking at a functional (nearing baseline) vocal intensity. She continues to have a flat affect, however she is engaging more in conversational turn taking, making eye contact appropriately, etc. She exhibits good recall of recent events, therapist's names and is fully oriented, however her attention continues to impact all areas of her cognition. She is not able to effectively complete tasks requiring beyond a sustained level of attention and requires frequent verbal cues to redirect. She appears to be very safe with transfers from bed to [facility] and when using RW. She is ready for discharge home with daughter.  Care Partner:  Caregiver Able to Provide Assistance: Yes  Type of Caregiver Assistance:  Physical;Cognitive  Recommendation:  24 hour supervision/assistance;Outpatient SLP;Home Health SLP  Rationale for SLP Follow Up: Maximize functional communication;Maximize cognitive function and independence   Equipment: none for speech   Reasons for discharge: Discharged from hospital;Treatment goals met   Patient/Family Agrees with Progress Made and Goals Achieved: Yes    Sonia Baller, MA, CCC-SLP Speech Therapy

## 2020-07-18 NOTE — Progress Notes (Signed)
Patient ID: Cheryl Ortiz, female   DOB: Mar 19, 1951, 70 y.o.   MRN: [id number] met with pt and daughter to update regarding team conference and readiness for discharge tomorrow. AP-OP to contact daughter to set up follow up appointments. RW in room. Ready for DC tomorrow.

## 2020-07-18 NOTE — Progress Notes (Signed)
Occupational Therapy Session Note  Patient Details  Name: Cheryl Ortiz MRN: [id number] Date of Birth: 03-Oct-1950  Today's Date: 07/18/2020 OT Individual Time: 0932-1030 OT Individual Time Calculation (min): 58 min    Short Term Goals: Week 3:  OT Short Term Goal 1 (Week 3): STG = LTGs due to remaining LOS  Skilled Therapeutic Interventions/Progress Updates:    Treatment session with focus on self-care retraining, functional mobility, L attention, and functional use of LUE.  Pt received upright in bed agreeable to therapy session.  Pt declined any bathing/dressing but agreeable to completing LB dressing for assessment.  Pt completed bed mobility with supervision.  Pt doffed pants at sit > stand level with min cues for initiation.  Pt then able to don new pants with good attention to LLE and recall of hemi-technique, donned pants with supervision at sit > stand level.  Pt completed toilet transfers with supervision and completed oral care in standing with supervision and increased time to locate items in L visual field.  Engaged in 9 hole peg test with R: 35.1 and L: 42.3 with improved attention to and functional use of LUE.  Pt asking questions about stroke signs/symptoms. Therapist provided pt with "BE FAST" handout and educated on the acronym.  Pt demonstrating increased awareness of LUE and L attention during self-care tasks.  Pt remained upright in recliner with chair alarm on and all needs in reach.  Therapy Documentation Precautions:  Precautions Precautions: Fall Precaution Comments: L inattention Restrictions Weight Bearing Restrictions: No Pain:  Pt with no c/o pain   Therapy/Group: Individual Therapy  Simonne Come 07/18/2020, 12:20 PM

## 2020-07-18 NOTE — Progress Notes (Signed)
[facility] PHYSICAL MEDICINE & REHABILITATION PROGRESS NOTE  Subjective/Complaints: No complaints today  ROS: Denies CP, SOB, N/V/D  Objective: Vital Signs: Blood pressure 105/64, pulse 80, temperature (!) 97.5 F (36.4 C), temperature source Oral, resp. rate 16, height 5\' 6"  (1.676 m), weight 101.8 kg, SpO2 97 %. No results found. Recent Labs    07/16/20 1127  WBC 5.7  HGB 12.9  HCT 38.8  PLT 335   Recent Labs    07/16/20 1127 07/18/20 0435  NA 138 142  K 3.2* 4.4  CL 105 110  CO2 22 21*  GLUCOSE 112* 103*  BUN 12 9  CREATININE 0.87 0.70  CALCIUM 9.6 9.4    Intake/Output Summary (Last 24 hours) at 07/18/2020 1919 Last data filed at 07/18/2020 1230 Gross per 24 hour  Intake 150 ml  Output --  Net 150 ml        Physical Exam: BP 105/64 (BP Location: Left Arm)   Pulse 80   Temp (!) 97.5 F (36.4 C) (Oral)   Resp 16   Ht 5\' 6"  (1.676 m)   Wt 101.8 kg   SpO2 97%   BMI 36.22 kg/m   Gen: no distress, normal appearing HEENT: oral mucosa pink and moist, NCAT Cardio: Reg rate Chest: normal effort, normal rate of breathing Abd: soft, non-distended Ext: no edema Psych: Normal mood.  Normal behavior. Musc: No edema in extremities.  No tenderness in extremities. Neuro: Alert and oriented x4 Gradual improvinginsight and awareness Motor: Left upper extremity: 4-4+/5 proximal distal, stable Left lower extremity: Hip flexion, knee extension 4-4+/5, ankle dorsiflexion 4+/5, stable  Assessment/Plan: 1. Functional deficits which require 3+ hours per day of interdisciplinary therapy in a comprehensive inpatient rehab setting.  Physiatrist is providing close team supervision and 24 hour management of active medical problems listed below.  Physiatrist and rehab team continue to assess barriers to discharge/monitor patient progress toward functional and medical goals   Care Tool:  Bathing    Body parts bathed by patient: Left arm,Chest,Abdomen,Front perineal  area,Buttocks,Right upper leg,Left upper leg,Face,Right lower leg,Right arm,Left lower leg   Body parts bathed by helper: Left lower leg     Bathing assist Assist Level: Supervision/Verbal cueing     Upper Body Dressing/Undressing Upper body dressing   What is the patient wearing?: Bra,Pull over shirt    Upper body assist Assist Level: Set up assist    Lower Body Dressing/Undressing Lower body dressing      What is the patient wearing?: Pants     Lower body assist Assist for lower body dressing: Supervision/Verbal cueing     Toileting Toileting    Toileting assist Assist for toileting: Supervision/Verbal cueing     Transfers Chair/bed transfer  Transfers assist     Chair/bed transfer assist level: Supervision/Verbal cueing     Locomotion Ambulation   Ambulation assist      Assist level: Supervision/Verbal cueing Assistive device: Walker-rolling Max distance: 125ft   Walk 10 feet activity   Assist     Assist level: Supervision/Verbal cueing Assistive device: Walker-rolling   Walk 50 feet activity   Assist    Assist level: Supervision/Verbal cueing Assistive device: Walker-rolling    Walk 150 feet activity   Assist Walk 150 feet activity did not occur: Safety/medical concerns  Assist level: Supervision/Verbal cueing Assistive device: Walker-rolling    Walk 10 feet on uneven surface  activity   Assist Walk 10 feet on uneven surfaces activity did not occur: Safety/medical concerns   Assist  level: Supervision/Verbal cueing Assistive device: Aeronautical engineer Will patient use wheelchair at discharge?: No      Wheelchair assist level: Dependent - Patient 0% Max wheelchair distance: 200    Wheelchair 50 feet with 2 turns activity    Assist        Assist Level: Dependent - Patient 0%   Wheelchair 150 feet activity     Assist      Assist Level: Dependent - Patient 0%   Medical Problem  List and Plan: 1.  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  Continue CIR  Repeat stat CT showing unchanged size of hematoma.  No changes per neurology 2.  Antithrombotics: -DVT/anticoagulation: SCDs             -antiplatelet therapy: N/A 3. Pain Management: Continue Tylenol as needed  Chronic left foot pain:   Does not appear to limit therapies   Voltaren gel to left ankle    Continue Lidoderm patch ordered for left foot, with some improvement  Controlled with meds and distraction on 3/2 4. Mood: Provide emotional support             -antipsychotic agents: N/A 5. Neuropsych: This patient is not fully capable of making decisions on her own behalf.  Ritalin increased to 5 mg twice daily 2/12 with improvement 6. Skin/Wound Care: Routine skin checks 7. Fluids/Electrolytes/Nutrition: Routine in and outs 8.  Diabetes mellitus type II with hyperglycemia.  Hemoglobin A1c 6.2.  Continue Glucophage 1000 mg daily.      CBG (last 3)  Recent Labs    07/18/20 0631 07/18/20 1201 07/18/20 1639  GLUCAP 108* 104* 71   Relatively controlled on 3/2 9.  Hypertension.   HCTZ 25 mg daily, decreased to 12.5 on 2/10, DC'd on 2/15  Soft on 3/2- continue to monitor.  10.  History of kidney stones with stent placement 2017 11. Obesity (BMI 36.48): Provide dietary counseling 12.  Hypokalemia  K+ 4.4- d/c supplement.   Continue to monitor  LOS: 23 days A FACE TO FACE EVALUATION WAS PERFORMED  Clide Deutscher Brooke Payes 07/18/2020, 7:19 PM

## 2020-07-18 NOTE — Progress Notes (Signed)
Physical Therapy Discharge Summary  Patient Details  Name: Cheryl Ortiz MRN: [id number] Date of Birth: Jul 13, 1950  Today's Date: 07/18/2020 PT Individual Time: 1102-1155 PT Individual Time Calculation (min): 53 min   Patient has met 9 of 9 long term goals due to improved activity tolerance, improved balance, improved postural control, increased strength, improved attention, improved awareness and improved coordination. Patient to discharge at an ambulatory level Supervision.  Patient's care partner is independent to provide the necessary physical assistance at discharge. Pt's daughter, April, attended family education training on 3/1 and verbalized and demonstrated confidence with all tasks (and cues to provide) to ensure safe discharge home. Pt and daughter with no further questions regarding mobility.   Al goals met   Recommendation:  Patient will benefit from ongoing skilled PT services in outpatient setting to continue to advance safe functional mobility, address ongoing impairments in transfers, generalized strengthening, dynamic standing balance/coordination, NMR, gait training, endurance, and to minimize fall risk.  Equipment: RW  Reasons for discharge: treatment goals met  Patient/family agrees with progress made and goals achieved: Yes  Today's Interventions: Received pt sitting in recliner, pt agreeable to therapy, and denied any pain during session. Session with emphasis on discharge planning, functional mobility/transfers, generalized strengthening, dynamic standing balance/coordination, L attention, initiation, ambulation, toileting, and improved activity tolerance. Pt ambulated 52f without AD and CGA to WC and [facility] for time management purposes. Pt ambulated 164fon uneven surfaces (ramp) with RW and supervision and stood and picked up small lego with RW and CGA with no LOB. Pt transported to dayroom and transferred onto Nustep with RW  and supervision. Pt performed BUE/LE strengthening on Nustep at workload 5 for 8 minutes for a total of 428 steps for improved cardiovascular endurance with therapist on L side promoting L attention during conversation as pt distracted by other people in the gym on her R. [facility] and requested to toilet. Pt ambulated 1037fith RW and supervision into bathroom and able to manage clothing and void with supervision. Pt requested new brief and doffed old one with supervision and required max A to don clean one. Pt stood at sink and washed hands with supervision and ambulated 5ft24fth RW and supervision to recliner. Concluded session with pt sitting in recliner, needs within reach, and chair pad alarm on.   PT Discharge Precautions/Restrictions Precautions Precautions: Fall Precaution Comments: L inattention Restrictions Weight Bearing Restrictions: No Cognition Overall Cognitive Status: Impaired/Different from baseline Arousal/Alertness: Awake/alert Orientation Level: Oriented X4 Memory: Impaired Awareness: Impaired Problem Solving: Impaired Safety/Judgment: Appears intact Comments: delayed initation Sensation Sensation Light Touch: Appears Intact Proprioception: Appears Intact Coordination Gross Motor Movements are Fluid and Coordinated: No Fine Motor Movements are Fluid and Coordinated: Yes Coordination and Movement Description: mild uncoordination due to L inattention, delayed initation, decreased balance, and generalized weakness Finger Nose Finger Test: WFL bilaterally Heel Shin Test: WFL bilaterally Motor  Motor Motor: Hemiplegia Motor - Skilled Clinical Observations: mild L hemi, L inattention, decreased initation, decreased balance, and generalized weakness; however improved since eval  Mobility Bed Mobility Bed Mobility: Rolling Right;Rolling Left;Supine to Sit;Sit to Supine Rolling Right: Supervision/verbal cueing Rolling Left:  Supervision/Verbal cueing Supine to Sit: Supervision/Verbal cueing Sit to Supine: Supervision/Verbal cueing Transfers Transfers: Sit to Stand;Stand to Sit;Stand Pivot Transfers Sit to Stand: Supervision/Verbal cueing Stand to Sit: Supervision/Verbal cueing Stand Pivot Transfers: Supervision/Verbal cueing Stand Pivot Transfer Details: Verbal cues for technique;Verbal  cues for safe use of DME/AE Stand Pivot Transfer Details (indicate cue type and reason): verbal cues for RW management and for occasional initation of transfers as pt is easily distracted Transfer (Assistive device): Rolling walker Locomotion  Gait Ambulation: Yes Gait Assistance: Supervision/Verbal cueing Gait Distance (Feet): 150 Feet Assistive device: Rolling walker Gait Assistance Details: Verbal cues for gait pattern;Verbal cues for safe use of DME/AE;Verbal cues for precautions/safety Gait Assistance Details: verbal cues for L attention, RW safety, and to increase step length Gait Gait: Yes Gait Pattern: Impaired Gait Pattern: Step-to pattern;Decreased stride length;Decreased step length - left;Decreased step length - right;Poor foot clearance - right;Poor foot clearance - left;Narrow base of support;Decreased trunk rotation Gait velocity: decreased Stairs / Additional Locomotion Stairs: Yes Stairs Assistance: Contact Guard/Touching assist Stair Management Technique: Two rails Number of Stairs: 12 Height of Stairs: 6 Ramp: Supervision/Verbal cueing Curb: Supervision/Verbal cueing (RW) Wheelchair Mobility Wheelchair Mobility: No  Trunk/Postural Assessment  Cervical Assessment Cervical Assessment: Within Functional Limits Thoracic Assessment Thoracic Assessment: Exceptions to WFL (kyphosis) Lumbar Assessment Lumbar Assessment: Exceptions to [facility] (posterior pelvic tilt) Postural Control Postural Control: Deficits on evaluation  Balance Balance Balance Assessed: Yes Static Sitting Balance Static Sitting -  Balance Support: Feet supported;No upper extremity supported Static Sitting - Level of Assistance: 7: Independent Dynamic Sitting Balance Dynamic Sitting - Balance Support: Feet supported;No upper extremity supported Dynamic Sitting - Level of Assistance: 6: Modified independent (Device/Increase time) Static Standing Balance Static Standing - Balance Support: Bilateral upper extremity supported (RW) Static Standing - Level of Assistance: 5: Stand by assistance (supervision) Dynamic Standing Balance Dynamic Standing - Balance Support: Bilateral upper extremity supported (RW) Dynamic Standing - Level of Assistance: 5: Stand by assistance (Supervision) Extremity Assessment  RLE Assessment RLE Assessment: Within Functional Limits LLE Assessment LLE Assessment: Within Functional Limits   Alfonse Alpers PT, DPT  07/18/2020, 7:47 AM

## 2020-07-18 NOTE — Progress Notes (Signed)
Pt has SCD on for the night. RN will continue to monitor.

## 2020-07-18 NOTE — Progress Notes (Signed)
Occupational Therapy Discharge Summary  Patient Details  Name: IMMACULATE CRUTCHER MRN: [id number] Date of Birth: 01-Dec-1950   Patient has met 10 of 10 long term goals due to improved activity tolerance, improved balance, ability to compensate for deficits, functional use of  LEFT upper and LEFT lower extremity, improved attention, improved awareness and improved coordination.  Patient to discharge at overall Supervision level.  Patient's care partner is independent to provide the necessary cognitive assistance at discharge.  Pt's daughter, April, present for hands on family education demonstrating ability to provide supervision/cues for L attention and safety during mobility and self-care tasks.    Reasons goals not met: N/A  Recommendation:  Patient will benefit from ongoing skilled OT services in outpatient setting to continue to advance functional skills in the area of BADL and Reduce care partner burden.  Equipment: No equipment provided  Reasons for discharge: treatment goals met and discharge from hospital  Patient/family agrees with progress made and goals achieved: Yes  OT Discharge Precautions/Restrictions  Precautions Precautions: Fall Precaution Comments: L inattention Restrictions Weight Bearing Restrictions: No General   Vital Signs Therapy Vitals Temp: (!) 97.5 F (36.4 C) Temp Source: Oral Pulse Rate: 80 Resp: 16 BP: 105/64 Patient Position (if appropriate): Sitting Oxygen Therapy SpO2: 97 % O2 Device: Room Air Pain Pain Assessment Pain Scale: 0-10 Pain Score: 0-No pain ADL ADL Eating: Set up Grooming: Setup,Supervision/safety Where Assessed-Grooming: Standing at sink Upper Body Bathing: Supervision/safety Where Assessed-Upper Body Bathing: Shower Lower Body Bathing: Supervision/safety Where Assessed-Lower Body Bathing: Shower Upper Body Dressing: Setup Where Assessed-Upper Body Dressing: Edge of bed Lower Body Dressing: Supervision/safety Where  Assessed-Lower Body Dressing: Edge of bed Toileting: Supervision/safety Where Assessed-Toileting: Glass blower/designer: Close supervision Armed forces technical officer Method: Counselling psychologist: Bedside commode (over toilet) Social research officer, government: Curator Method: Heritage manager: Civil engineer, contracting with back,Grab bars Vision Baseline Vision/History: Wears glasses Wears Glasses: At all times Patient Visual Report: No change from baseline Vision Assessment?: Yes Eye Alignment: Within Functional Limits Ocular Range of Motion: Within Functional Limits Alignment/Gaze Preference: Within Defined Limits Tracking/Visual Pursuits: Decreased smoothness of vertical tracking;Unable to hold eye position out of midline Visual Fields: Left visual field deficit Perception  Perception: Impaired Inattention/Neglect: Does not attend to left visual field;Does not attend to left side of body Praxis Praxis: Impaired Praxis Impairment Details: Motor planning;Initiation Cognition Overall Cognitive Status: Impaired/Different from baseline Arousal/Alertness: Awake/alert Orientation Level: Oriented to person;Oriented to place;Oriented to time Attention: Selective Focused Attention: Appears intact Sustained Attention: Appears intact Selective Attention: Impaired Memory: Impaired Awareness: Impaired Problem Solving: Impaired Behaviors: Perseveration Safety/Judgment: Appears intact Comments: delayed initation Sensation Sensation Light Touch: Appears Intact Proprioception: Appears Intact Coordination Gross Motor Movements are Fluid and Coordinated: No Fine Motor Movements are Fluid and Coordinated: Yes Coordination and Movement Description: mild uncoordination due to L inattention, delayed initation, decreased balance, and generalized weakness Finger Nose Finger Test: [facility] bilaterally Heel Shin Test: WFL bilaterally Motor  Motor Motor:  Hemiplegia Motor - Skilled Clinical Observations: mild L hemi, L inattention, decreased initation, decreased balance, and generalized weakness; however improved since eval Mobility  Bed Mobility Bed Mobility: Rolling Right;Rolling Left;Supine to Sit;Sit to Supine Rolling Right: Supervision/verbal cueing Rolling Left: Supervision/Verbal cueing Supine to Sit: Supervision/Verbal cueing Sit to Supine: Supervision/Verbal cueing Transfers Sit to Stand: Supervision/Verbal cueing Stand to Sit: Supervision/Verbal cueing  Trunk/Postural Assessment  Cervical Assessment Cervical Assessment: Within Functional Limits Thoracic Assessment Thoracic Assessment: Exceptions to [facility] (kyphosis) Lumbar Assessment Lumbar Assessment:  Exceptions to [facility] (posterior pelvic tilt) Postural Control Postural Control: Deficits on evaluation  Balance Balance Balance Assessed: Yes Static Sitting Balance Static Sitting - Balance Support: Feet supported;No upper extremity supported Static Sitting - Level of Assistance: 7: Independent Dynamic Sitting Balance Dynamic Sitting - Balance Support: Feet supported;No upper extremity supported Dynamic Sitting - Level of Assistance: 6: Modified independent (Device/Increase time) Static Standing Balance Static Standing - Balance Support: Bilateral upper extremity supported (RW) Static Standing - Level of Assistance: 5: Stand by assistance (supervision) Dynamic Standing Balance Dynamic Standing - Balance Support: Bilateral upper extremity supported (RW) Dynamic Standing - Level of Assistance: 5: Stand by assistance (Supervision) Extremity/Trunk Assessment RUE Assessment RUE Assessment: Within Functional Limits General Strength Comments: 4+/5 LUE Assessment LUE Assessment: Within Functional Limits Active Range of Motion (AROM) Comments: WFL - but resistance at end range of sh flexion due to increased tone General Strength Comments: grossly 4+/5   Treniya Lobb, [facility] 07/18/2020,  4:11 PM

## 2020-07-18 NOTE — Patient Care Conference (Addendum)
Inpatient RehabilitationTeam Conference and Plan of Care Update Date: 07/18/2020   Time: 11:05 AM    Patient Name: Cheryl Ortiz      Medical Record Number: [id number]  Date of Birth: [birth year] Sex: Female         Room/Bed: 5C07C/5C07C-01 Payor Info: Payor: MEDICARE / Plan: MEDICARE PART A AND B / Product Type: *No Product type* /    Admit Date/Time:  06/25/2020  3:55 PM  Primary Diagnosis:  ICH (intracerebral hemorrhage) [facility])  Hospital Problems: Principal Problem:   ICH (intracerebral hemorrhage) ([place]) Active Problems:   Hypokalemia   Essential hypertension   Controlled type 2 diabetes mellitus with hyperglycemia, without long-term current use of insulin ([place])   History of hypertension   Hypotension due to drugs   Labile blood glucose   Attention and concentration deficit   Left foot pain   Labile blood pressure   Pressure injury of skin    Expected Discharge Date: Expected Discharge Date: 07/19/20  Team Members Present: Physician leading conference: Dr. Leeroy Cha Care Coodinator Present: Ovidio Kin, LCSW PT Present: Magda Kiel, PT OT Present: Meriel Pica, OT     Current Status/Progress Goal Weekly Team Focus  Bowel/Bladder   pt continent B/B LBM 3/1  Remain continent B/B  q2 toileting give PRN laxatives   Swallow/Nutrition/ Hydration   intermittent supervision, no dysphagia         ADL's   very slow processing, perseverative behavior.  Overall CGA to close supervision with bathing and toileting, transfers with RW, min-mod assist for LB dressing  supervision overall, downgraded LB dressing to Min assist  ADL retraining, functional transfers, processing, seqeuncing, L attention, awareness, family education, d/c planning   Mobility   bed mobility supervision, transfers CGA, gait>151ft with RW and CGA, 8 steps min A  supervision/CGA  functional mobility/transfers, generalized strengthening, dynamic standing balance/coordination, ambulation, L attention,  awareness, initiation.   Communication   Mod A  Mod A - downgraded 2/28  education, prosody, pragmatics, awareness impacted reduced vocal intensity   Safety/Cognition/ Behavioral Observations  Mod A, poor carryover, improved attention in 5 minute intervals  Min A, Mod A emergent and selective attention  education, sustained/selective attention, emergent awareness and problem solving   Pain   Pt denies pain  remain pain free  assess pain level per shift give PRN   Skin   no skin issues  skin care and remains intact  Daily skin care PRN     Discharge Planning:  Family education completed yesterday and daughter aware of 24/7 care. pt will be going to OP at [facility] for therapies-been there before.   Team Discussion: On target for discharge  Patient on target to meet rehab goals: yes  *See Care Plan and progress notes for long and short-term goals.   Revisions to Treatment Plan:    Teaching Needs:   Current Barriers to Discharge: Decreased caregiver support  Possible Resolutions to Barriers: Family education completed     Medical Summary Current Status: slow processing, BP is well controlled, skin tear to buttocks, vital signs stable, CBGs slightly elevated, left foot pain, moving bowels regularly.  Barriers to Discharge: Medical stability;Wound care  Barriers to Discharge Comments: skin tear to buttocks, CBGs slightly elevated, left foot pain Possible Resolutions to [employer] Focus: daily wound care, continue to monitor CBGs AC/HS, continue tylenol and lidocaine patch   Continued Need for Acute Rehabilitation Level of Care: The patient requires daily medical management by a physician with specialized training  in physical medicine and rehabilitation for the following reasons: Direction of a multidisciplinary physical rehabilitation program to maximize functional independence : Yes Medical management of patient stability for increased activity during participation in an  intensive rehabilitation regime.: Yes Analysis of laboratory values and/or radiology reports with any subsequent need for medication adjustment and/or medical intervention. : Yes   I attest that I was present, lead the team conference, and concur with the assessment and plan of the team.   Izora Ribas 07/18/2020, 7:32 PM

## 2020-07-18 NOTE — Progress Notes (Signed)
Inpatient Rehabilitation Care Coordinator Discharge Note  The overall goal for the admission was met for:   Discharge location: [place] 24/7 CARE  Length of Stay: Yes-24 DAYS  Discharge activity level: Yes-SUPERVISION WITH CUEING  Home/community participation: Yes  Services provided included: MD, RD, PT, OT, SLP, RN, CM, TR, Pharmacy, Neuropsych and SW  Financial Services: Medicare and Private Insurance: CHAMPUS  Choices offered to/list presented to:YES  Follow-up services arranged: Outpatient: [facility]-PT,OT,SP WILL CALL DAUGHTER TO ARRANGE APPOINTMENTS, DME: [facility]ROLL[facility] WALKER CAN BORROW TUB SEAT and Patient/Family has no preference for HH/DME agencies  Comments (or additional information):APRILLIA WAS HERE FOR EDUCATION AND IT WENT WELL, AWARE MOM WILL NEED 24/7 CARE AT DISCHARGE AND WILL HAVE THIS BETWEEN SHE AND OTHER FAMILY MEMBERS.  Patient/Family verbalized understanding of follow-up arrangements: Yes  Individual responsible for coordination of the follow-up plan: [facility] [phone]  Confirmed correct DME delivered: Elease Hashimoto 07/18/2020    [place], Gardiner Rhyme

## 2020-07-23 ENCOUNTER — Telehealth: Payer: Self-pay | Admitting: *Deleted

## 2020-07-23 DIAGNOSIS — I639 Cerebral infarction, unspecified: Secondary | ICD-10-CM | POA: Diagnosis not present

## 2020-07-23 DIAGNOSIS — Z1331 Encounter for screening for depression: Secondary | ICD-10-CM | POA: Diagnosis not present

## 2020-07-23 DIAGNOSIS — E118 Type 2 diabetes mellitus with unspecified complications: Secondary | ICD-10-CM | POA: Diagnosis not present

## 2020-07-23 DIAGNOSIS — Z6833 Body mass index (BMI) 33.0-33.9, adult: Secondary | ICD-10-CM | POA: Diagnosis not present

## 2020-07-23 DIAGNOSIS — I10 Essential (primary) hypertension: Secondary | ICD-10-CM | POA: Diagnosis not present

## 2020-07-23 DIAGNOSIS — R5383 Other fatigue: Secondary | ICD-10-CM | POA: Diagnosis not present

## 2020-07-23 NOTE — Telephone Encounter (Signed)
Transitional Care call completed Spoke with patients daughter, Wylie Hail Appointment date,time and location confirmed   1. Are you/is patient experiencing any problems since coming home?  No Are there any questions regarding any aspect of care? No  2. Are there any questions regarding medications administration/dosing? No  Are meds being taken as prescribed? Yes Patient should review meds with caller to confirm   3. Have there been any falls?  No  4. Has Home Health been to the house and/or have they contacted you? Straight to outpatient If not, have you tried to contact them? Can we help you contact them?   5. Are bowels and bladder emptying properly? Yes Are there any unexpected incontinence issues? No If applicable, is patient following bowel/bladder programs?   6. Any fevers, problems with breathing, unexpected pain?  No  7. Are there any skin problems or new areas of breakdown? No  8. Has the patient/family member arranged specialty MD follow up (ie cardiology/neurology/renal/surgical/etc)?  Yes Can we help arrange?  no  9. Does the patient need any other services or support that we can help arrange? No   10. Are caregivers following through as expected in assisting the patient? Yes, a bit overwhelmed but handling it all the same  11. Has the patient quit smoking, drinking alcohol, or using drugs as recommended? Daughter says patient does not do these things

## 2020-07-27 ENCOUNTER — Encounter: Payer: Self-pay | Admitting: Registered Nurse

## 2020-07-27 ENCOUNTER — Encounter: Payer: Medicare Other | Attending: Registered Nurse | Admitting: Registered Nurse

## 2020-07-27 ENCOUNTER — Other Ambulatory Visit: Payer: Self-pay

## 2020-07-27 VITALS — BP 158/79 | HR 82 | Temp 98.7°F | Ht 66.0 in | Wt 214.6 lb

## 2020-07-27 DIAGNOSIS — E1165 Type 2 diabetes mellitus with hyperglycemia: Secondary | ICD-10-CM | POA: Diagnosis not present

## 2020-07-27 DIAGNOSIS — M79672 Pain in left foot: Secondary | ICD-10-CM | POA: Diagnosis not present

## 2020-07-27 DIAGNOSIS — I611 Nontraumatic intracerebral hemorrhage in hemisphere, cortical: Secondary | ICD-10-CM | POA: Diagnosis not present

## 2020-07-27 MED ORDER — METHYLPHENIDATE HCL 5 MG PO TABS: 5.0000 mg | ORAL_TABLET | Freq: Two times a day (BID) | ORAL | 0 refills | Status: DC

## 2020-07-27 NOTE — Progress Notes (Signed)
Subjective:    Patient ID: Cheryl Ortiz, female    DOB: 01-29-1951, 70 y.o.   MRN: [id number]  HPI: Cheryl Ortiz is a 70 y.o. female who is here for Transitional Care Visit for follow up on her [place]  ( Intracerebral hemorrhage),Controlled Type 2 DM and left foot pain.  Cheryl Ortiz presented to [facility] emergency room on 06/22/2020 with complaint of confusion and altered mental status. She was transferred to [facility]. Neurology Consulted.   CT Head WO Contrast:  IMPRESSION: Area of hemorrhage within the anteromedial right frontal lobe. Adjacent small amount of blood to the left side of the falx could be within the left frontal lobe or subarachnoid blood. No mass effect or midline shift.  CT Angio Head W or WO Contrast:  IMPRESSION: 1. No sign of high flow vascular malformation in the region of the right frontal hemorrhage. No sign of venous thrombosis. Cortical venous thrombosis can be difficult to see by CT. 2. No change in the appearance of the right frontal lobe intraparenchymal hemorrhage with mild surrounding edema. No mass effect or shift.  CT Venogram: Head IMPRESSION: No visible venous thrombosis. Superficial venous thrombosis can be difficult to see by CT.  MR Brain W WO Contrast:  IMPRESSION: 1. Right frontal (and smaller left frontal) intraparenchymal hemorrhages, likely similar in size when comparing across modalities. Small volume of adjacent hemorrhage along the falx and suspected trace right frontal subarachnoid hemorrhage. No surrounding restricted diffusion to suggest hemorrhagic transformation of infarct. No visible mass lesion, although acute blood products limit evaluation. A follow-up MRI after resolution of hemorrhage could further evaluate for underlying abnormality. 2. 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  Cheryl Ortiz was admitted to inpatient rehabilitation on 06/25/2020 and discharged  home on 07/19/2020. She is scheduled for outpatient rehabilitation on 08/01/2020 at [facility]. She states she has pain in her left foot. She rates her pain 7. Walking with walker and reports she has a good appetite.   Daughter in room all questions answered.     Pain Inventory Average Pain 7 Pain Right Now 7 My pain is constant, sharp, burning, dull, stabbing, tingling and aching  LOCATION OF PAIN  Left ankle  BOWEL Number of stools per week: 20 plus Oral laxative use No  Type of laxative  None Enema or suppository use No  History of colostomy No  Incontinent No   BLADDER Pads In and out cath, frequency N/A Able to self cath No  Bladder incontinence No  Frequent urination No  Leakage with coughing No  Difficulty starting stream No  Incomplete bladder emptying No    Mobility use a walker how many minutes can you walk? Unknown ability to climb steps?  yes do you drive?  no Do you have any goals in this area?  yes  Function retired I need assistance with the following:  meal prep, household duties and shopping Do you have any goals in this area?  yes  Neuro/Psych weakness numbness trouble walking spasms confusion anxiety  Prior Studies Any changes since last visit?  yes New Patient  Physicians involved in your care Any changes since last visit?  yes New Patient   Family History  Problem Relation Age of Onset  . Hypertension Mother   . Arthritis Mother   . Cancer Mother        breast   . Cerebral palsy Brother   . Early death Brother   . Hypertension Brother   .  Heart disease Brother   . Pneumonia Father   . Hypertension Father   . Hyperlipidemia Sister   . Breast cancer Sister   . Fibroids Sister   . Hypertension Brother   . Heart attack Brother    Social History   Socioeconomic History  . Marital status: Widowed    Spouse name: Not on file  . Number of children: Not on file  . Years of education: Not on file  . Highest education  level: Not on file  Occupational History  . Not on file  Tobacco Use  . Smoking status: Former Smoker    Packs/day: 0.50    Years: 12.00    Pack years: 6.00    Types: Cigarettes    Quit date: 08/15/1997    Years since quitting: 22.9  . Smokeless tobacco: Never Used  Vaping Use  . Vaping Use: Never used  Substance and Sexual Activity  . Alcohol use: Not Currently  . Drug use: No  . Sexual activity: Not Currently    Birth control/protection: Surgical    Comment: hyst  Other Topics Concern  . Not on file  Social History Narrative  . Not on file   Social Determinants of Health   Financial Resource Strain: Not on file  Food Insecurity: Not on file  Transportation Needs: Not on file  Physical Activity: Not on file  Stress: Not on file  Social Connections: Not on file   Past Surgical History:  Procedure Laterality Date  . CHOLECYSTECTOMY  1999  . COLONOSCOPY N/A 06/15/2014   Procedure: COLONOSCOPY;  Surgeon: Rogene Houston, MD;  [facility];  Service: Endoscopy;  Laterality: N/A;  1145  . CYSTOSCOPY WITH RETROGRADE PYELOGRAM, URETEROSCOPY AND STENT PLACEMENT Bilateral 08/20/2015   Procedure: CYSTOSCOPY WITH RETROGRADE PYELOGRAM, URETEROSCOPY AND STENT PLACEMENT WITH STONE EXTRACTION WITH BASKET;  Surgeon: Cleon Gustin, MD;  Location: [facility];  Service: Urology;  Laterality: Bilateral;  . ESOPHAGEAL DILATION N/A 03/23/2015   Procedure: ESOPHAGEAL DILATION;  Surgeon: Rogene Houston, MD;  [facility];  Service: Endoscopy;  Laterality: N/A;  . ESOPHAGOGASTRODUODENOSCOPY N/A 03/23/2015   Procedure: ESOPHAGOGASTRODUODENOSCOPY (EGD);  Surgeon: Rogene Houston, MD;  [facility];  Service: Endoscopy;  Laterality: N/A;  8:55 - moved to 12:35 - Ann to notify pt  . FOOT SURGERY Bilateral   . HOLMIUM LASER APPLICATION Bilateral 8/0/9983   Procedure: HOLMIUM LASER APPLICATION;  Surgeon: Cleon Gustin, MD;  Location: [facility];  Service: Urology;  Laterality: Bilateral;  . VAGINAL HYSTERECTOMY  1984   Past Medical History:  Diagnosis Date  . 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 Cold   . Cystocele 07/12/2015  . Hyperlipidemia   . Hypertension   . Neuropathy of both feet    tingling/numbness bilateral feet ,rt arm,hands-nerve study scheduled  . Osteoarthritis   . Renal disorder    kidney stones  . Seasonal allergies   . Type 2 diabetes mellitus (HCC)    There were no vitals taken for this visit.  Opioid Risk Score:   Fall Risk Score:  `1  Depression screen PHQ 2/9  Depression screen [facility] 2/9 12/09/2018 09/09/2017  Decreased Interest 0 0  Down, Depressed, Hopeless 0 0  PHQ - 2 Score 0 0  Altered sleeping - 0  Tired, decreased energy - 2  Change in appetite - 0  Feeling bad or failure about yourself  -  0  Trouble concentrating - 0  Moving slowly or fidgety/restless - 0  Suicidal thoughts - 0  PHQ-9 Score - 2  Difficult doing work/chores - Somewhat difficult   Review of Systems  Cardiovascular: Positive for leg swelling.  Musculoskeletal: Positive for gait problem.  Neurological: Positive for weakness and numbness.       Mental focus  All other systems reviewed and are negative.      Objective:   Physical Exam Vitals and nursing note reviewed.  Constitutional:      Appearance: Normal appearance.  Cardiovascular:     Rate and Rhythm: Normal rate and regular rhythm.     Pulses: Normal pulses.     Heart sounds: Normal heart sounds.  Pulmonary:     Effort: Pulmonary effort is normal.     Breath sounds: Normal breath sounds.  Musculoskeletal:     Cervical back: Normal range of motion and neck supple.     Comments: Normal Muscle Bulk and Muscle Testing Reveals:  Upper Extremities: Full ROM and Muscle Strength 5/5 Lower Extremities: Right: Full ROM and Muscle Strength 5/5 Left Lower Extremity: Decreased ROM and Muscle Strength 5/5  Left Lower  Extremity Flexion produces pain into her left foot Arises from Table slowly using walker for support Narrow Based  Gait   Skin:    General: Skin is warm and dry.  Neurological:     Mental Status: She is alert and oriented to person, place, and time.  Psychiatric:        Mood and Affect: Mood normal.        Behavior: Behavior normal.           Assessment & Plan:  1.  ICH ( Intracerebral hemorrhage): She has a scheduled appointment with Neurology: She will begin Outpatient Therapy at [facility] on 08/01/2020. Continue to monitor.  2. Controlled Type 2 DM: Continue current medication regimen. PCP Following . Continue to monitor.   3.Left foot pain. Continue current medication regimen. Continue to monitor.   F/U with Dr Posey Pronto in 4- 6 weeks

## 2020-07-27 NOTE — Patient Instructions (Signed)
Call [facility]: [phone], if no one has called by Monday afternoon to schedule hospital follow up appointment.

## 2020-07-31 DIAGNOSIS — Z6834 Body mass index (BMI) 34.0-34.9, adult: Secondary | ICD-10-CM | POA: Diagnosis not present

## 2020-07-31 DIAGNOSIS — E6609 Other obesity due to excess calories: Secondary | ICD-10-CM | POA: Diagnosis not present

## 2020-07-31 DIAGNOSIS — I639 Cerebral infarction, unspecified: Secondary | ICD-10-CM | POA: Diagnosis not present

## 2020-07-31 DIAGNOSIS — M7989 Other specified soft tissue disorders: Secondary | ICD-10-CM | POA: Diagnosis not present

## 2020-08-01 ENCOUNTER — Ambulatory Visit (HOSPITAL_COMMUNITY): Payer: Medicare Other | Admitting: Occupational Therapy

## 2020-08-01 ENCOUNTER — Encounter (HOSPITAL_COMMUNITY): Payer: Self-pay | Admitting: Occupational Therapy

## 2020-08-01 ENCOUNTER — Encounter (HOSPITAL_COMMUNITY): Payer: Self-pay | Admitting: Physical Therapy

## 2020-08-01 ENCOUNTER — Encounter (HOSPITAL_COMMUNITY): Payer: Self-pay | Admitting: Speech Pathology

## 2020-08-01 ENCOUNTER — Other Ambulatory Visit: Payer: Self-pay

## 2020-08-01 ENCOUNTER — Encounter: Payer: Self-pay | Admitting: Registered Nurse

## 2020-08-01 ENCOUNTER — Ambulatory Visit (HOSPITAL_COMMUNITY): Payer: Medicare Other | Admitting: Speech Pathology

## 2020-08-01 ENCOUNTER — Ambulatory Visit (HOSPITAL_COMMUNITY): Payer: Medicare Other | Attending: Physical Medicine and Rehabilitation | Admitting: Physical Therapy

## 2020-08-01 DIAGNOSIS — R41841 Cognitive communication deficit: Secondary | ICD-10-CM | POA: Insufficient documentation

## 2020-08-01 DIAGNOSIS — M6281 Muscle weakness (generalized): Secondary | ICD-10-CM | POA: Diagnosis not present

## 2020-08-01 DIAGNOSIS — R262 Difficulty in walking, not elsewhere classified: Secondary | ICD-10-CM | POA: Diagnosis not present

## 2020-08-01 DIAGNOSIS — R29898 Other symptoms and signs involving the musculoskeletal system: Secondary | ICD-10-CM | POA: Insufficient documentation

## 2020-08-01 NOTE — Therapy (Signed)
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/2015   brain-small will recheck in 1 year   Cold    Cystocele 07/12/2015   Hyperlipidemia    Hypertension    Neuropathy of both feet    tingling/numbness bilateral feet ,rt arm,hands-nerve study scheduled   Osteoarthritis    Renal disorder    kidney stones   Seasonal allergies    Type 2 diabetes mellitus ([place])     Past Surgical History:  Procedure Laterality Date   CHOLECYSTECTOMY  1999   COLONOSCOPY N/A 06/15/2014   Procedure: COLONOSCOPY;  Surgeon: Rogene Houston, MD;  [facility];  Service: Endoscopy;  Laterality: N/A;  [place], URETEROSCOPY AND STENT PLACEMENT Bilateral 08/20/2015   Procedure: CYSTOSCOPY WITH RETROGRADE PYELOGRAM, URETEROSCOPY AND STENT PLACEMENT WITH STONE EXTRACTION WITH BASKET;  Surgeon: Cleon Gustin, MD;  Location: [facility];  Service: Urology;  Laterality: Bilateral;   ESOPHAGEAL DILATION N/A 03/23/2015   Procedure: ESOPHAGEAL DILATION;  Surgeon: Rogene Houston, MD;  [facility];  Service: Endoscopy;  Laterality: N/A;   ESOPHAGOGASTRODUODENOSCOPY N/A 03/23/2015   Procedure: ESOPHAGOGASTRODUODENOSCOPY (EGD);  Surgeon: Rogene Houston, MD;  [facility];  Service:  Endoscopy;  Laterality: N/A;  8:55 - moved to 12:35 - Ann to notify pt   FOOT SURGERY Bilateral    HOLMIUM LASER APPLICATION Bilateral 08/19/8766   Procedure: HOLMIUM LASER APPLICATION;  Surgeon: Cleon Gustin, MD;  Location: [facility];  Service: Urology;  Laterality: Bilateral;   VAGINAL HYSTERECTOMY  1984    There were no vitals filed for this visit.     08/01/20 1307  SLP Visit Information  SLP Received On 08/01/20  Referring Provider (SLP) Reesa Chew, PA-C  Onset Date 06/22/2020  Medical Diagnosis right frontal ICH  Subjective  Subjective "It just feels like I do everything wrong."  Patient/Family Stated Goal "Get better"  Pain Assessment  Currently in Pain? No/denies  General Information  HPI [name] presented to [facility] emergency room on 06/22/2020 with complaint of confusion and altered mental status. She was transferred to [facility]. Neurology Consulted. [name] was admitted to inpatient rehabilitation on 06/25/2020 for treatment for her right frontal ICH and discharged home on 07/19/2020. She was referred for outpatient SLP by Reesa Chew, PA-C.  Behavioral/Cognition Alert and cooperative  Mobility Status with a rolling walker  Balance Screen  Has the patient fallen in the past 6 months No  Has the patient had a decrease in activity level because of a fear of falling?  No  Is the patient reluctant to leave their home because of a fear of falling?  No  Prior Functional  Status  Cognitive/Linguistic Baseline WFL  Type of Home House   Lives With Family  Available Support Family  Education 9th grade  Vocation Part time employment  Cognition  Overall Cognitive Status Impaired/Different from baseline  Area of Impairment Attention  Current Attention Level Sustained  Memory Impaired  Memory Impairment Retrieval deficit (5/5 immediate, 3/5 delay)  Awareness Appears intact  Problem Solving Appears intact  Manufacturing engineer (planning)  Organizing Impaired  Organizing Impairment Functional complex;Verbal complex  Auditory Comprehension  Overall Auditory Comprehension Appears within functional limits for tasks assessed  Yes/No Questions WFL  Commands WFL  Conversation Complex  Interfering Components Attention  Visual Recognition/Discrimination  Discrimination WFL  Reading Comprehension  Reading Status Not tested  Expression  Primary Mode of Expression Verbal  Verbal Expression  Overall Verbal Expression Impaired  Initiation No impairment  Automatic Speech Name;Social Response  Level of Generative/Spontaneous Verbalization Conversation  Repetition No impairment  Naming Impairment  Responsive 76-100% accurate  Confrontation 75-100% accurate  Convergent 75-100% accurate  Divergent 50-74% accurate  Pragmatics No impairment  Interfering Components Attention  Non-Verbal Means of Communication Not applicable  Written Expression  Dominant Hand Right  Written Expression Not tested  Oral Motor/Sensory Function  Overall Oral Motor/Sensory Function Appears within functional limits for tasks assessed  Motor Speech  Overall Motor Speech Appears within functional limits for tasks assessed  Standardized Assessments  Standardized Assessments   (SLUMS 25/30 adjusted)  Plan  Duration 4 weeks        SLP Short Term Goals - 08/01/20 1308      SLP SHORT TERM GOAL #1    Title Pt will implement memory strategies in functional therapy activities with 90% acc with min cues.    Baseline 80%    Time 4    Period Weeks    Status On-going    Target Date 09/03/20        SLP SHORT TERM GOAL #2   Title Pt will complete moderate-level thought organization and planning activities with 95% acc and min assist.    Baseline 80%    Time 4    Period Weeks    Status On-going    Target Date 09/03/20                Plan - 08/01/20 1307    Clinical Impression Statement Pt presents with  mild attention deficits which impacts memory and planning/organization tasks. She indicates that she feels much better after returning to her home environment. 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 She indicates that she hopes to be able to resume driving. In the hospital, she was noted to have some left visual inattention. The SLUMS was administered and Pt scored a 25/30 with reduction of points for divergent naming (she states she never knew many animals and did improve when naming fruits and vegetables), memory (3/5 after delay), and hand placement for the clock. Pt will benefit from skilled SLP in order to address the above impairments, maximize independence, and decrease burden of care      Speech Therapy Frequency 1x /week    Duration 4 weeks    Treatment/Interventions Compensatory strategies;Patient/family education;Cueing hierarchy;Cognitive reorganization;SLP instruction and feedback    Potential to Achieve Goals Good    SLP Home Exercise Plan Pt will completed HEP as assigned to  facilitate carryover of treatment strategies and techniques in home environment    Consulted and Agree with Plan of Care Patient           Patient will benefit from skilled therapeutic intervention in order to improve the following deficits and impairments:   Cognitive communication deficit    Problem List Patient Active Problem List   Diagnosis Date Noted   Pressure injury of skin 07/18/2020   Labile blood pressure    Left foot pain    Attention and concentration deficit    History of hypertension    Hypotension due to drugs    Labile blood glucose    Hypokalemia    Essential hypertension    Controlled type 2 diabetes mellitus with hyperglycemia, without long-term current use of insulin (HCC)    ICH  (intracerebral hemorrhage) ([place]) 06/23/2020   Pelvic floor relaxation 03/07/2020   Screening for colorectal cancer 12/09/2018   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

## 2020-08-01 NOTE — Patient Instructions (Addendum)
Repeat all exercises 10-15 times, 1-2 times per day.  1) Shoulder Protraction    Begin with elbows by your side, slowly "punch" straight out in front of you.      2) Shoulder Flexion  Supine:     Standing:         Begin with arms at your side with thumbs pointed up, slowly raise both arms up and forward towards overhead.               3) Horizontal abduction/adduction  Supine:   Standing:           Begin with arms straight out in front of you, bring out to the side in at "T" shape. Keep arms straight entire time.                 4) Internal & External Rotation   Supine:     Standing:     Stand with elbows at the side and elbows bent 90 degrees. Move your forearms away from your body, then bring back inward toward the body.     5) Shoulder Abduction  Supine:     Standing:       Lying on your back begin with your arms flat on the table next to your side. Slowly move your arms out to the side so that they go overhead, in a jumping jack or snow angel movement.    6) X to V arms (cheerleader move):  Begin with arms straight down, crossed in front of body in an "X". Keeping arms crossed, lift arms straight up overhead. Then spread arms apart into a "V" shape.  Bring back together into x and lower down to starting position.    7) W arms:  Begin with elbows bent and even with shoulders, hands pointing to ceiling. Keeping elbows at shoulder level, 1-shrug shoulders up, 2-squeeze shoulder blades together, and 3-relax.     

## 2020-08-01 NOTE — Therapy (Signed)
[facility] [address] [place], [place], [address] Phone: [phone]   Fax:  [phone]  Physical Therapy Evaluation  Patient Details  Name: Cheryl Ortiz MRN: [id number] Date of Birth: [birth year] Referring Provider (PT): Reesa Chew   Encounter Date: 08/01/2020   PT End of Session - 08/01/20 1159    Visit Number 1    Number of Visits 12    Date for PT Re-Evaluation 09/12/20    Authorization Type medicare    Progress Note Due on Visit 10    PT Start Time 1005    PT Stop Time 1045    PT Time Calculation (min) 40 min    Activity Tolerance Patient tolerated treatment well    Behavior During Therapy [facility] for tasks assessed/performed           Past Medical History:  Diagnosis Date  . 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 Cold   . Cystocele 07/12/2015  . Hyperlipidemia   . Hypertension   . Neuropathy of both feet    tingling/numbness bilateral feet ,rt arm,hands-nerve study scheduled  . Osteoarthritis   . Renal disorder    kidney stones  . Seasonal allergies   . Type 2 diabetes mellitus ([place])     Past Surgical History:  Procedure Laterality Date  . CHOLECYSTECTOMY  1999  . COLONOSCOPY N/A 06/15/2014   Procedure: COLONOSCOPY;  Surgeon: Rogene Houston, MD;  [facility];  Service: Endoscopy;  Laterality: N/A;  1145  . CYSTOSCOPY WITH RETROGRADE PYELOGRAM, URETEROSCOPY AND STENT PLACEMENT Bilateral 08/20/2015   Procedure: CYSTOSCOPY WITH RETROGRADE PYELOGRAM, URETEROSCOPY AND STENT PLACEMENT WITH STONE EXTRACTION WITH BASKET;  Surgeon: Cleon Gustin, MD;  [facility]: [facility];  Service: Urology;  Laterality: Bilateral;  . ESOPHAGEAL DILATION N/A 03/23/2015   Procedure: ESOPHAGEAL DILATION;  Surgeon: Rogene Houston, MD;  [facility];  Service: Endoscopy;  Laterality: N/A;  . ESOPHAGOGASTRODUODENOSCOPY N/A 03/23/2015   Procedure: ESOPHAGOGASTRODUODENOSCOPY  (EGD);  Surgeon: Rogene Houston, MD;  [facility];  Service: Endoscopy;  Laterality: N/A;  8:55 - moved to 12:35 - Ann to notify pt  . FOOT SURGERY Bilateral   . HOLMIUM LASER APPLICATION Bilateral 06/23/8525   Procedure: HOLMIUM LASER APPLICATION;  Surgeon: Cleon Gustin, MD;  Location: [facility];  Service: Urology;  Laterality: Bilateral;  . VAGINAL HYSTERECTOMY  1984    There were no vitals filed for this visit.    Subjective Assessment - 08/01/20 1013    Subjective Pt states that she had a CVA on 2/4 was D/C on 2/7 to IP rehab.  She has been home since 3/3.  She is currently using a walker unless she forgets it and gets up to walk.    Pertinent History HTN    How long can you walk comfortably? PT has walked with a walker for 10 minutes with the therapist at [place] re[facility].    Patient Stated Goals to Drive, go shopping, housework, care taker for her bother.    Currently in Pain? Yes    Pain Score 2    Lt foot swolled up after walking on and off for hours the other day   Pain Location Foot    Pain Orientation Left    Pain Descriptors / Indicators Aching    Pain Type Acute pain    Pain Radiating Towards Rt foot    Pain Onset In  the past 7 days    Pain Frequency Intermittent    Aggravating Factors  wt bearing    Pain Relieving Factors meds    Effect of Pain on Daily Activities limits              [facility] PT Assessment - 08/01/20 0001      Assessment   Medical Diagnosis Rt CVA with Lt weakness    Referring Provider (PT) Reesa Chew    Onset Date/Surgical Date 06/22/20    Prior Therapy acute and IP      Precautions   Precautions None      Restrictions   Weight Bearing Restrictions No      Balance Screen   Has the patient fallen in the past 6 months No    Has the patient had a decrease in activity level because of a fear of falling?  Yes    Is the patient reluctant to leave their home because of a fear of falling?  No      Prior Function    Level of Independence Independent      Cognition   Overall Cognitive Status Within Functional Limits for tasks assessed      Observation/Other Assessments   Focus on Therapeutic Outcomes (FOTO)  --   foto set for UE     Functional Tests   Functional tests Single leg stance;Sit to Stand      Single Leg Stance   Comments Rt: 2 "; Lt 0"      Sit to Stand   Comments 8 in 30 seconds      ROM / Strength   AROM / PROM / Strength Strength      Strength   Strength Assessment Site Hip;Knee;Ankle    Right/Left Hip Right;Left    Right Hip Flexion 5/5    Right Hip Extension 3+/5    Right Hip ABduction 5/5    Left Hip Flexion 4/5    Left Hip Extension 3/5    Left Hip ABduction 3/5    Right/Left Knee Right;Left    Right Knee Flexion 4/5    Right Knee Extension 5/5    Left Knee Flexion 3+/5    Left Knee Extension 4/5    Right/Left Ankle Right;Left    Right Ankle Dorsiflexion 5/5    Left Ankle Dorsiflexion 3+/5      Bed Mobility   Bed Mobility --   I     Ambulation/Gait   Gait Comments PT ambulating with a RW with ease appears to be able to go to a cane but currently LT ankle is swollen and painful so we will defer this until next visit.  Pt daughter vocalizes that she is going to purchase a compression garment for her mother.                      Objective measurements completed on examination: See above findings.       [facility] Adult PT Treatment/Exercise - 08/01/20 0001      Exercises   Exercises Knee/Hip      Knee/Hip Exercises: Seated   Long Arc Quad 5 reps    Other Seated Knee/Hip Exercises ankle dorsi/plantar flexion x10    Sit to Sand 10 reps;without UE support      Knee/Hip Exercises: Supine   Bridges 10 reps    Straight Leg Raises Right                  PT Education -  08/01/20 1158    Education Details HEP    Person(s) Educated Patient;Child(ren)    Methods Explanation;Demonstration;Verbal cues;Handout    Comprehension Returned  demonstration;Verbalized understanding            PT Short Term Goals - 08/01/20 1209      PT SHORT TERM GOAL #1   Title PT to be I in HEP to improve strength of LE to be able to come sit to stand from a low couch with ease.    Time 3    Period Weeks    Status New    Target Date 08/22/20      PT SHORT TERM GOAL #2   Title PT to be able to single leg stance for 10 seconds B to allow pt to feel confident walking with a cane.    Time 3    Period Weeks    Status New      PT SHORT TERM GOAL #3   Title Pt LE strength to be at least 4+/5 to allow pt to be able to go up and down 6 steps in a reciprocal manner using a handrail    Time 3    Period Weeks             PT Long Term Goals - 08/01/20 1211      PT LONG TERM GOAL #1   Title PT to be I in an advance HEP to allow pt to return to prior level of walking without assistive device.    Time 6    Period Weeks    Status New    Target Date 09/12/20      PT LONG TERM GOAL #2   Title PT LE and core strength to be 5/5 to allow pt to easily get in and out of a car.    Time 6    Period Weeks    Status New      PT LONG TERM GOAL #3   Title PT to be able to stand/walk for two hour without having any increase swelling or pain in her LT ankle.    Time 6    Period Weeks    Status New                  Plan - 08/01/20 1202    Clinical Impression Statement Ms. 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  the pt has had acute and IP rehabilitation.  She was released from IP on 07/19/2020 and is currently being referred for OP skilled physical therapy.  Evaluation demonstrates decreased strength, decreased balance, and decreased activity tolerance. Prior to the CVA Ms. Amparo used  no assistive device for amublation  but comes to the department using a walker. Ms. Bousquet will benefit from skilled PT to address these issues and progress her to her maximal functional ability.     Personal Factors and Comorbidities Fitness    Examination-Activity Limitations Caring for Others;Carry;Locomotion Level;Dressing;Stairs    Examination-Participation Restrictions Church;Community Activity;Driving;Laundry;Meal Prep    Stability/Clinical Decision Making Evolving/Moderate complexity    Clinical Decision Making Moderate    Rehab Potential Good    PT Frequency 2x / week    PT Duration 6 weeks    PT Treatment/Interventions ADLs/Self Care Home Management;Gait training;DME Instruction;Stair training;Functional mobility training;Therapeutic activities;Therapeutic exercise;Balance training;Neuromuscular re-education;Patient/family education;Manual techniques    PT Next Visit Plan Begin gt training with cane, heel raises,  tandem stance, step up step downs, lunges, progress strengthening and balance.    PT Home Exercise Plan sitting: LAQ, ankle dorsi/plantarflexion,sit to stand, supine: SLR< bridge. sidelying: abduction    Consulted and Agree with Plan of Care Patient           Patient will benefit from skilled therapeutic intervention in order to improve the following deficits and impairments:  Abnormal gait,Decreased activity tolerance,Decreased balance,Difficulty walking,Decreased strength,Pain  Visit Diagnosis: Difficulty in walking, not elsewhere classified  Muscle weakness (generalized)     Problem List Patient Active Problem List   Diagnosis Date Noted  . Pressure injury of skin 07/18/2020  . Labile blood pressure   . Left foot pain   . Attention and concentration deficit   . History of hypertension   . Hypotension due to drugs   . Labile blood glucose   . Hypokalemia   . Essential hypertension   . Controlled type 2 diabetes mellitus with hyperglycemia, without long-term current use of insulin ([place])   . ICH (intracerebral hemorrhage) ([place]) 06/23/2020  . Pelvic floor relaxation 03/07/2020  . Screening for colorectal cancer 12/09/2018  . 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 Snoring 06/08/2018  . Poor sleep hygiene 06/08/2018  . Chronic insomnia 06/08/2018  . Encounter for well woman exam with routine gynecological exam 09/09/2017  . Female cystocele 09/09/2017  . Cystocele 07/12/2015  . Precordial pain 02/18/2013  . Type 2 diabetes mellitus ([place]) 02/18/2013  . Hyperlipidemia 02/17/2013  . Essential hypertension, benign 02/17/2013  Rayetta Humphrey, PT CLT [phone] 08/01/2020, 12:15 PM  [facility] [place], [place], [address] Phone: [phone]   Fax:  [phone]  Name: Cheryl Ortiz MRN: [id number] Date of Birth: 1951/02/01

## 2020-08-02 ENCOUNTER — Ambulatory Visit: Payer: Medicare Other | Admitting: Obstetrics & Gynecology

## 2020-08-02 NOTE — Therapy (Addendum)
Cheryl Ortiz, [place], [address] Phone: [phone]   Fax:  [phone]  Occupational Therapy Evaluation  Patient Details  Name: Cheryl Ortiz MRN: [id number] Date of Birth: 15-Feb-1951 Referring Provider (OT): Bary Leriche, [place]   Encounter Date: 08/01/2020   OT End of Session - 08/01/20 1619    Visit Number 1    Number of Visits 4    Date for OT Re-Evaluation 08/29/20    Authorization Type 1) Medicare part A 2) [employer]    Progress Note Due on Visit 10    OT Start Time 1116    OT Stop Time 1212    OT Time Calculation (min) 56 min           Past Medical History:  Diagnosis Date  . Aneurysm ([place]) 06/2015   brain-small will recheck in 1 year  . Cold   . Cystocele 07/12/2015  . Hyperlipidemia   . Hypertension   . Neuropathy of both feet    tingling/numbness bilateral feet ,rt arm,hands-nerve study scheduled  . Osteoarthritis   . Renal disorder    kidney stones  . Seasonal allergies   . Type 2 diabetes mellitus ([place])     Past Surgical History:  Procedure Laterality Date  . CHOLECYSTECTOMY  1999  . COLONOSCOPY N/A 06/15/2014   Procedure: COLONOSCOPY;  Surgeon: Rogene Houston, MD;  [facility];  Service: Endoscopy;  Laterality: N/A;  1145  . CYSTOSCOPY WITH RETROGRADE PYELOGRAM, URETEROSCOPY AND STENT PLACEMENT Bilateral 08/20/2015   Procedure: CYSTOSCOPY WITH RETROGRADE PYELOGRAM, URETEROSCOPY AND STENT PLACEMENT WITH STONE EXTRACTION WITH BASKET;  Surgeon: Cleon Gustin, MD;  Location: [facility];  Service: Urology;  Laterality: Bilateral;  . ESOPHAGEAL DILATION N/A 03/23/2015   Procedure: ESOPHAGEAL DILATION;  Surgeon: Rogene Houston, MD;  [facility];  Service: Endoscopy;  Laterality: N/A;  . ESOPHAGOGASTRODUODENOSCOPY N/A 03/23/2015   Procedure: ESOPHAGOGASTRODUODENOSCOPY (EGD);  Surgeon: Rogene Houston, MD;  [facility];  Service: Endoscopy;   Laterality: N/A;  8:55 - moved to 12:35 - Ann to notify pt  . FOOT SURGERY Bilateral   . HOLMIUM LASER APPLICATION Bilateral 05/25/5100   Procedure: HOLMIUM LASER APPLICATION;  Surgeon: Cleon Gustin, MD;  Location: [facility];  Service: Urology;  Laterality: Bilateral;  . VAGINAL HYSTERECTOMY  1984    There were no vitals filed for this visit.   Subjective Assessment - 08/01/20 1119    Subjective  Feeling pain in L foot unrelated to the stroke. Happening sincs last thursday swelling. Doctor gave medicine for swelling yesterday.    Patient is accompanied by: Family member    Pertinent History Pt referred by. Februrary 4th onset of stroke.    Limitations Recent foot swelling causing limitatoin.    Special Tests FOTO    Patient Stated Goals Get back as much to what was normal as possible.    Currently in Pain? Yes    Pain Score 6    6 to 10 in R foot with movmeent. No pain without movement.   Pain Location Foot    Pain Orientation Left    Pain Descriptors / Indicators Sharp   Feels like a bone spur.   Pain Type Acute pain    Pain Radiating Towards Left foot.    Pain Onset In the past 7 days    Pain Frequency Intermittent    Aggravating Factors  movement  Pain Relieving Factors medication    Effect of Pain on Daily Activities limits             [facility] OT Assessment - 08/01/20 1124      Assessment   Medical Diagnosis Rt CVA with Lt weakness    Referring Provider (OT) Bary Leriche, PA-C    Onset Date/Surgical Date 06/22/20    Hand Dominance Right    Next MD Visit 09/13/2020    Prior Therapy Therapy in hospital; d/c with instructoins.      Precautions   Precautions None      Restrictions   Weight Bearing Restrictions No      Balance Screen   Has the patient fallen in the past 6 months No    Has the patient had a decrease in activity level because of a fear of falling?  No    Is the patient reluctant to leave their home because of a fear of falling?   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    Leisure entertain; likes having company and family around.      ADL   Lower Body Bathing Set up   Pt reportedly requires set up assist for bathing.   ADL comments Pt reported the need for set up assist for bathing at this time. Pt reported feeling uneasy getting into shower at this time. Reportedly has tub bunch but has not used it.      IADL   Prior Level of Function Shopping independnt.      Mobility   Mobility Status Comments household ambulate with RW      Written Expression   Dominant Hand Right      Cognition   Overall Cognitive Status Within Functional Limits for tasks assessed      Observation/Other Assessments   Focus on Therapeutic Outcomes (FOTO)  72/100      Coordination   9 Hole Peg Test Right;Left    Right 9 Hole Peg Test 25 seconds    Left 9 Hole Peg Test 27 seconds      ROM / Strength   AROM / PROM / Strength Strength      Strength   Strength Assessment Site Shoulder;Elbow;Forearm;Wrist;Hand    Right/Left Shoulder Left;Right    Right Shoulder Flexion 4+/5    Right Shoulder ABduction 4+/5    Left Shoulder Flexion 4-/5    Left Shoulder ABduction 4+/5    Left Shoulder External Rotation 4/5    Right/Left Elbow Left    Left Elbow Flexion 5/5    Left Elbow Extension 4/5    Right/Left Forearm Left    Left Forearm Pronation 4/5    Left Forearm Supination 4+/5    Right/Left Wrist Left    Left Wrist Flexion 5/5    Left Wrist Extension 4/5    Left Wrist Radial Deviation 5/5    Right/Left hand Left;Right   Taken seated adducted in neutral wrist positoin.   Right Hand Grip (lbs) 39    Right Hand Lateral Pinch 12 lbs    Right Hand 3 Point Pinch 10 lbs    Left Hand Grip (lbs) 40    Left Hand Lateral Pinch 16 lbs    Left Hand 3 Point Pinch 11 lbs  OT Short Term Goals - 08/01/20 1253      OT SHORT TERM GOAL #1   Title Pt will increase strength in LUE to 4+/5 to improve ability to perform lifting tasks required for functional ADL and IADL tasks.    Time 4    Period Weeks    Status New    Target Date 08/29/20      OT SHORT TERM GOAL #2   Title Pt will be educated on HEP to improve ability to actively use LUE during functional task completion.    Time 4    Period Weeks    Status New    Target Date 08/29/20      OT SHORT TERM GOAL #3   Title Pt will improve functional ADLs such as bathing and dressing to Mod I 75% or more of trials, per patient report and observation.    Time 4    Period Weeks    Status New    Target Date 08/29/20                    Plan - 08/01/20 1620    Clinical Impression Statement A: Pt is a 70 year old female presenting with L UE weakness and decreased functional use after a CVA on 06/22/2020. Pt is mildly limited in functional strength and endurance of L UE for ADL and IADL tasks at this time. Pt demonstrates A/ROM [facility] but demosntrates mild weakness for L UE flexion, elbow extension, and pronation specifically at 4/5 or below for MMT. Pt reportedly requires S/U assist for bathing at this time and is not yet driving or cooking for herself.    OT Occupational Profile and History Problem Focused Assessment - Including review of records relating to presenting problem    Occupational performance deficits (Please refer to evaluation for details): ADL's;IADL's    Body Structure / Function / Physical Skills ADL;Gait;Balance;Strength;UE functional use;IADL;Pain    Rehab Potential Good    Clinical Decision Making Limited treatment options, no task modification necessary    Comorbidities Affecting Occupational Performance: May have comorbidities impacting occupational performance    Modification or Assistance to Complete Evaluation  No modification of tasks or assist  necessary to complete eval    OT Frequency 1x / week    OT Duration 4 weeks    OT Treatment/Interventions Self-care/ADL training;Moist Heat;DME and/or AE instruction;Balance training;Therapeutic activities;Therapeutic exercise;Passive range of motion;Manual Therapy;Patient/family education;Coping strategies training;Cognitive remediation/compensation    Plan P: Pt will benefit from skilled Ot services to increase L UE strength and functional use during daily tasks. Treatment plan:  A/ROM, gneral L UE strengthening, modalities prn, myofascial release PRN, manual techniques. Provide advanced directives handouts per pt request.    OT Home Exercise Plan 3/16 A/ROM shoulder    Consulted and Agree with Plan of Care Patient;Family member/caregiver    Family Member Consulted Daughter           Patient will benefit from skilled therapeutic intervention in order to improve the following deficits and impairments:   Body Structure / Function / Physical Skills: ADL,Gait,Balance,Strength,UE functional use,IADL,Pain       Visit Diagnosis: Muscle weakness (generalized)    Problem List Patient Active Problem List   Diagnosis Date Noted  . Pressure injury of skin 07/18/2020  . Labile blood pressure   . Left foot pain   . Attention and concentration deficit   . History of hypertension   . Hypotension due to drugs   . Labile blood  glucose   . Hypokalemia   . Essential hypertension   . Controlled type 2 diabetes mellitus with hyperglycemia, without long-term current use of insulin ([place])   . ICH (intracerebral hemorrhage) ([place]) 06/23/2020  . Pelvic floor relaxation 03/07/2020  . Screening for colorectal cancer 12/09/2018  . 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 Snoring 06/08/2018  . Poor sleep hygiene 06/08/2018  . Chronic insomnia 06/08/2018  . Encounter for well woman exam with routine gynecological exam 09/09/2017  . Female cystocele 09/09/2017  . Cystocele  07/12/2015  . Precordial pain 02/18/2013  . Type 2 diabetes mellitus ([place]) 02/18/2013  . Hyperlipidemia 02/17/2013  . Essential hypertension, benign 02/17/2013   Larey Seat OT, MOT  Larey Seat 08/02/2020, 4:40 PM  [facility] [address] [place], [place], [address] Phone: [phone]   Fax:  [phone]  Name: JANAYLA MARIK MRN: [id number] Date of Birth: 07/04/1950

## 2020-08-07 ENCOUNTER — Encounter (HOSPITAL_COMMUNITY): Payer: Self-pay | Admitting: Speech Pathology

## 2020-08-07 ENCOUNTER — Other Ambulatory Visit: Payer: Self-pay

## 2020-08-07 ENCOUNTER — Ambulatory Visit (HOSPITAL_COMMUNITY): Payer: Medicare Other | Admitting: Speech Pathology

## 2020-08-07 DIAGNOSIS — M6281 Muscle weakness (generalized): Secondary | ICD-10-CM | POA: Diagnosis not present

## 2020-08-07 DIAGNOSIS — R41841 Cognitive communication deficit: Secondary | ICD-10-CM

## 2020-08-07 DIAGNOSIS — R262 Difficulty in walking, not elsewhere classified: Secondary | ICD-10-CM | POA: Diagnosis not present

## 2020-08-07 DIAGNOSIS — R29898 Other symptoms and signs involving the musculoskeletal system: Secondary | ICD-10-CM | POA: Diagnosis not present

## 2020-08-07 NOTE — Therapy (Signed)
[facility] [place] [place], [place], [address] Phone: [phone]   Fax:  [phone]  Speech Language Pathology Treatment  Patient Details  Name: Cheryl Ortiz MRN: [id number] Date of Birth: 1950/06/28 Referring Provider (SLP): Reesa Chew, PA-C   Encounter Date: 08/07/2020   End of Session - 08/07/20 1706    Visit Number 2    Number of Visits 5    Date for SLP Re-Evaluation 09/03/20    Authorization Type Medicare    SLP Start Time 1600    SLP Stop Time  2694    SLP Time Calculation (min) 45 min    Activity Tolerance Patient tolerated treatment well           Past Medical History:  Diagnosis Date  . 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 Cold   . Cystocele 07/12/2015  . Hyperlipidemia   . Hypertension   . Neuropathy of both feet    tingling/numbness bilateral feet ,rt arm,hands-nerve study scheduled  . Osteoarthritis   . Renal disorder    kidney stones  . Seasonal allergies   . Type 2 diabetes mellitus ([place])     Past Surgical History:  Procedure Laterality Date  . CHOLECYSTECTOMY  1999  . COLONOSCOPY N/A 06/15/2014   Procedure: COLONOSCOPY;  Surgeon: Rogene Houston, MD;  [facility];  Service: Endoscopy;  Laterality: N/A;  1145  . CYSTOSCOPY WITH RETROGRADE PYELOGRAM, URETEROSCOPY AND STENT PLACEMENT Bilateral 08/20/2015   Procedure: CYSTOSCOPY WITH RETROGRADE PYELOGRAM, URETEROSCOPY AND STENT PLACEMENT WITH STONE EXTRACTION WITH BASKET;  Surgeon: Cleon Gustin, MD;  Location: [facility];  Service: Urology;  Laterality: Bilateral;  . ESOPHAGEAL DILATION N/A 03/23/2015   Procedure: ESOPHAGEAL DILATION;  Surgeon: Rogene Houston, MD;  [facility];  Service: Endoscopy;  Laterality: N/A;  . ESOPHAGOGASTRODUODENOSCOPY N/A 03/23/2015   Procedure: ESOPHAGOGASTRODUODENOSCOPY (EGD);  Surgeon: Rogene Houston, MD;  [facility];  Service:  Endoscopy;  Laterality: N/A;  8:55 - moved to 12:35 - Ann to notify pt  . FOOT SURGERY Bilateral   . HOLMIUM LASER APPLICATION Bilateral 12/21/4625   Procedure: HOLMIUM LASER APPLICATION;  Surgeon: Cleon Gustin, MD;  Location: [facility];  Service: Urology;  Laterality: Bilateral;  . VAGINAL HYSTERECTOMY  1984    There were no vitals filed for this visit.   Subjective Assessment - 08/07/20 1702    Subjective "I am doing much better."    Currently in Pain? No/denies             ADULT SLP TREATMENT - 08/07/20 1702      General Information   Behavior/Cognition Alert;Cooperative;Pleasant mood    Patient Positioning Upright in chair    Oral care provided N/A    HPI Ms. Scarfo presented to [facility] emergency room on 06/22/2020 with complaint of confusion and altered mental status. She was transferred to [facility]. Neurology Consulted. Ms. Dingley was admitted to inpatient rehabilitation on 06/25/2020 for treatment for her right frontal ICH and discharged home on 07/19/2020. She was referred for outpatient SLP by Reesa Chew, PA-C.      Treatment Provided   Treatment provided Cognitive-Linquistic      Pain Assessment   Pain Assessment No/denies pain      Cognitive-Linquistic Treatment   Treatment focused on Cognition;Patient/family/caregiver education    Skilled Treatment SLP provided skilled SLP treatment by facilitating planning and organization task.  SLP also educated Pt on attention and memory skills in relation to strokes and compensatory strategies.      Assessment / Recommendations / Plan   Plan Continue with current plan of care      Progression Toward Goals   Progression toward goals Progressing toward goals            SLP Education - 08/07/20 1705    Education Details Provided Pt with folder with written information regarding memory strategies and daily to-do list    Person(s) Educated Patient    Methods Explanation;Handout    Comprehension  Verbalized understanding            SLP Short Term Goals - 08/07/20 1708      SLP SHORT TERM GOAL #1   Title Pt will implement memory strategies in functional therapy activities with 90% acc with min cues.    Baseline 80%    Time 4    Period Weeks    Status On-going    Target Date 09/03/20      SLP SHORT TERM GOAL #2   Title Pt will complete moderate-level thought organization and planning activities with 95% acc and min assist.    Baseline 80%    Time 4    Period Weeks    Status On-going    Target Date 09/03/20              Plan - 08/07/20 1706    Clinical Impression Statement Pt reports improvement at home and is resuming many activities (medication management, helping her brother in the mornings, and completing tasks at home). She plans to go to the [facility] board meeting on Thursday. In session, she completed a moderately complex planning activity with mod cues from SLP. She completed personally relevant functional planning/scheduling activity with min cues. She was encouraged to read through memory and planning information presented today at home and document daily activities before next session.    Speech Therapy Frequency 1x /week    Duration 4 weeks    Treatment/Interventions Compensatory strategies;Patient/family education;Cueing hierarchy;Cognitive reorganization;SLP instruction and feedback    Potential to Achieve Goals Good    SLP Home Exercise Plan Pt will completed HEP as assigned to facilitate carryover of treatment strategies and techniques in home environment    Consulted and Agree with Plan of Care Patient           Patient will benefit from skilled therapeutic intervention in order to improve the following deficits and impairments:   Cognitive communication deficit    Problem List Patient Active Problem List   Diagnosis Date Noted  . Pressure injury of skin 07/18/2020  . Labile blood pressure   . Left foot pain   . Attention and concentration  deficit   . History of hypertension   . Hypotension due to drugs   . Labile blood glucose   . Hypokalemia   . Essential hypertension   . Controlled type 2 diabetes mellitus with hyperglycemia, without long-term current use of insulin ([place])   . ICH (intracerebral hemorrhage) ([place]) 06/23/2020  . Pelvic floor relaxation 03/07/2020  . Screening for colorectal cancer 12/09/2018  . 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 Snoring 06/08/2018  . Poor sleep hygiene 06/08/2018  . Chronic insomnia 06/08/2018  . Encounter for well woman exam with routine gynecological exam 09/09/2017  . Female cystocele 09/09/2017  . Cystocele 07/12/2015  . Precordial pain 02/18/2013  . Type 2 diabetes mellitus ([place]) 02/18/2013  . Hyperlipidemia 02/17/2013  .  Essential hypertension, benign 02/17/2013   Thank you,  Genene Churn, [place]  [facility] 08/08/2020, 1:17 PM  [facility] [address] [place], [place], [address] Phone: [phone]   Fax:  [phone]   Name: JANETT KAMATH MRN: [id number] Date of Birth: [birth year]

## 2020-08-09 ENCOUNTER — Ambulatory Visit (HOSPITAL_COMMUNITY): Payer: Medicare Other | Admitting: Occupational Therapy

## 2020-08-09 ENCOUNTER — Ambulatory Visit (INDEPENDENT_AMBULATORY_CARE_PROVIDER_SITE_OTHER): Payer: Medicare Other | Admitting: Obstetrics & Gynecology

## 2020-08-09 ENCOUNTER — Encounter: Payer: Self-pay | Admitting: Obstetrics & Gynecology

## 2020-08-09 ENCOUNTER — Encounter (HOSPITAL_COMMUNITY): Payer: Self-pay | Admitting: Occupational Therapy

## 2020-08-09 ENCOUNTER — Other Ambulatory Visit: Payer: Self-pay

## 2020-08-09 VITALS — BP 112/70 | HR 82 | Ht 66.0 in | Wt 204.0 lb

## 2020-08-09 DIAGNOSIS — Z4689 Encounter for fitting and adjustment of other specified devices: Secondary | ICD-10-CM | POA: Diagnosis not present

## 2020-08-09 DIAGNOSIS — R262 Difficulty in walking, not elsewhere classified: Secondary | ICD-10-CM | POA: Diagnosis not present

## 2020-08-09 DIAGNOSIS — R29898 Other symptoms and signs involving the musculoskeletal system: Secondary | ICD-10-CM

## 2020-08-09 DIAGNOSIS — R41841 Cognitive communication deficit: Secondary | ICD-10-CM | POA: Diagnosis not present

## 2020-08-09 DIAGNOSIS — N993 Prolapse of vaginal vault after hysterectomy: Secondary | ICD-10-CM | POA: Diagnosis not present

## 2020-08-09 DIAGNOSIS — N8110 Cystocele, unspecified: Secondary | ICD-10-CM | POA: Diagnosis not present

## 2020-08-09 DIAGNOSIS — M6281 Muscle weakness (generalized): Secondary | ICD-10-CM

## 2020-08-09 NOTE — Patient Instructions (Signed)
    1) PNF Strengthening: Resisted Strengthening: Resisted Abduction   Hold tubing with right arm across body. Pull up and away from side. Move through pain-free range of motion. Repeat 10 times per set. Do 2 to 3 sets per session. Do 2 to 3 sessions per day.  http://orth.exer.us/826   Copyright  VHI. All rights reserved.    2) Strengthening: Resisted Adduction   Hold tubing in left hand, arm out. Pull arm toward opposite hip. Do not twist or rotate trunk. Repeat ____ times per set. Do ____ sets per session. Do ____ sessions per day.  http://orth.exer.us/834   Copyright  VHI. All rights reserved.   3) Strengthening: Resisted Extension   Hold tubing in right hand, arm forward. Pull arm back, elbow straight. Repeat ____ times per set. Do ____ sets per session. Do ____ sessions per day.  http://orth.exer.us/832   Copyright  VHI. All rights reserved.   4) Strengthening: Resisted External Rotation   Hold tubing in right hand, elbow at side and forearm across body. Rotate forearm out. Repeat ____ times per set. Do ____ sets per session. Do ____ sessions per day.  http://orth.exer.us/828   Copyright  VHI. All rights reserved.   5) Strengthening: Resisted Flexion   Hold tubing with left arm at side. Pull forward and up. Move shoulder through pain-free range of motion. Repeat ____ times per set. Do ____ sets per session. Do ____ sessions per day.  http://orth.exer.us/824   Copyright  VHI. All rights reserved.   6) Strengthening: Resisted Horizontal Abduction   Hold tubing in right hand, elbow straight, arm in, parallel to floor. Pull arm out from side through pain-free range. Repeat ____ times per set. Do ____ sets per session. Do ____ sessions per day.  http://orth.exer.us/836   Copyright  VHI. All rights reserved.

## 2020-08-09 NOTE — Therapy (Signed)
[facility] [address] [place], [place], [address] Phone: [phone]   Fax:  [phone]  Occupational Therapy Treatment  Patient Details  Name: Cheryl Ortiz MRN: [id number] Date of Birth: April 04, 1951 Referring Provider (OT): Bary Leriche, [place]   Encounter Date: 08/09/2020   OT End of Session - 08/09/20 1615    Visit Number 2    Number of Visits 4    Date for OT Re-Evaluation 08/29/20    Authorization Type 1) Medicare part A 2) [place]    Progress Note Due on Visit 10    OT Start Time 1351   Pt late   OT Stop Time 1431    OT Time Calculation (min) 40 min    Activity Tolerance Patient tolerated treatment well    Behavior During Therapy [facility] for tasks assessed/performed           Past Medical History:  Diagnosis Date  . Aneurysm ([place]) 06/2015   brain-small will recheck in 1 year  . Cold   . Cystocele 07/12/2015  . Gout   . Hyperlipidemia   . Hypertension   . Neuropathy of both feet    tingling/numbness bilateral feet ,rt arm,hands-nerve study scheduled  . Osteoarthritis   . Renal disorder    kidney stones  . Seasonal allergies   . Stroke ([place])   . Type 2 diabetes mellitus ([place])     Past Surgical History:  Procedure Laterality Date  . CHOLECYSTECTOMY  1999  . COLONOSCOPY N/A 06/15/2014   Procedure: COLONOSCOPY;  Surgeon: Rogene Houston, MD;  [facility];  Service: Endoscopy;  Laterality: N/A;  1145  . CYSTOSCOPY WITH RETROGRADE PYELOGRAM, URETEROSCOPY AND STENT PLACEMENT Bilateral 08/20/2015   Procedure: CYSTOSCOPY WITH RETROGRADE PYELOGRAM, URETEROSCOPY AND STENT PLACEMENT WITH STONE EXTRACTION WITH BASKET;  Surgeon: Cleon Gustin, MD;  Location: [facility];  Service: Urology;  Laterality: Bilateral;  . ESOPHAGEAL DILATION N/A 03/23/2015   Procedure: ESOPHAGEAL DILATION;  Surgeon: Rogene Houston, MD;  [facility];  Service: Endoscopy;  Laterality: N/A;  .  ESOPHAGOGASTRODUODENOSCOPY N/A 03/23/2015   Procedure: ESOPHAGOGASTRODUODENOSCOPY (EGD);  Surgeon: Rogene Houston, MD;  [facility];  Service: Endoscopy;  Laterality: N/A;  8:55 - moved to 12:35 - Ann to notify pt  . FOOT SURGERY Bilateral   . HOLMIUM LASER APPLICATION Bilateral 0/05/7791   Procedure: HOLMIUM LASER APPLICATION;  Surgeon: Cleon Gustin, MD;  Location: [facility];  Service: Urology;  Laterality: Bilateral;  . VAGINAL HYSTERECTOMY  1984    There were no vitals filed for this visit.   Subjective Assessment - 08/09/20 1351    Subjective  My foot is a lot better. They started treating me for gout.    Currently in Pain? No/denies              [facility] OT Assessment - 08/09/20 1353      Assessment   Medical Diagnosis Rt CVA with Lt weakness      Precautions   Precautions None                    OT Treatments/Exercises (OP) - 08/09/20 1353      Exercises   Exercises Shoulder      Shoulder Exercises: Seated   Protraction AROM;10 reps    Protraction Weight (lbs) 2    Horizontal ABduction AAROM;10 reps    Horizontal ABduction Weight (lbs) 2  External Rotation AAROM;10 reps    External Rotation Weight (lbs) 2   Shoulder adducted.   Internal Rotation AAROM;10 reps    Internal Rotation Weight (lbs) 2    Flexion AAROM;10 reps    Flexion Weight (lbs) 2    Abduction AAROM;10 reps    Theraband Level (Shoulder ABduction) --    ABduction Weight (lbs) 2      Shoulder Exercises: Standing   Theraband Level (Shoulder Horizontal ABduction) Level 3 (Green)   x10 each direction; verbal and tactile cuing throughout theraband strengthening.   Theraband Level (Shoulder External Rotation) Level 3 (Green)   x10   Theraband Level (Shoulder Flexion) Level 3 (Green)   x10   Theraband Level (Shoulder ABduction) Level 3 (Green)    Theraband Level (Shoulder Extension) Level 3 (Green)   x10   Theraband Level (Shoulder Diagonals) --   x10  towards hpa and away from hip.   Other Standing Exercises Standing peg board with 2 lb wrist weight.   Rested after filling ~ 4.5 of 10 rows.     Shoulder Exercises: ROM/Strengthening   UBE (Upper Arm Bike) level 1 2:30' forward 2:30' reverse 12.5 pace.                  OT Education - 08/09/20 1613    Education Details Provided HEP for shoulder theraband strengthening; provided green theraband and door anchor    Person(s) Educated Patient    Methods Explanation;Handout;Tactile cues;Demonstration;Verbal cues    Comprehension Returned demonstration;Verbalized understanding;Tactile cues required;Verbal cues required            OT Short Term Goals - 08/09/20 1619      OT SHORT TERM GOAL #1   Title Pt will increase strength in LUE to 4+/5 to improve ability to perform lifting tasks required for functional ADL and IADL tasks.    Time 4    Period Weeks    Status On-going    Target Date 08/29/20      OT SHORT TERM GOAL #2   Title Pt will be educated on HEP to improve ability to actively use LUE during functional task completion.    Time 4    Period Weeks    Status On-going    Target Date 08/29/20      OT SHORT TERM GOAL #3   Title Pt will improve functional ADLs such as bathing and dressing to Mod I 75% or more of trials, per patient report and observation.    Time 4    Period Weeks    Status On-going    Target Date 08/29/20                    Plan - 08/09/20 1615    Clinical Impression Statement Barnetta Chapel tolerated treatment well. Pt reported she is doing laundry, cooking, and cleaning at home. Pt reported that PT instructed here to complete UE dumbbell exercises. Pt was able to complete fucntoinal reaching task to fill peg board at shoulder height and above donning 2# wrist weight to L UE. Pt required a rest break nearly halfway through but completed after a rest break. Pt required demonstration and tacile cues for green theraband strengthening to L shoulder.  Pt able to complete shoulder strengthening with 2# dumbbell this date.    OT Treatment/Interventions Self-care/ADL training;Moist Heat;DME and/or AE instruction;Balance training;Therapeutic activities;Therapeutic exercise;Passive range of motion;Manual Therapy;Patient/family education;Coping strategies training;Cognitive remediation/compensation    Plan P: Follow up with pt about desire to get  into aquatics at the [facility]; give pt handout for advanced directives. Cotinue UE stregthening; progress to 3# dumbbell; complete theraband strengthening again to ensure pt understands exercises.    OT Home Exercise Plan 3/16 A/ROM shoulder; 3/24 shoulder theraband strengthening    Consulted and Agree with Plan of Care Patient           Patient will benefit from skilled therapeutic intervention in order to improve the following deficits and impairments:           Visit Diagnosis: Muscle weakness (generalized)  Other symptoms and signs involving the musculoskeletal system    Problem List Patient Active Problem List   Diagnosis Date Noted  . Pressure injury of skin 07/18/2020  . Labile blood pressure   . Left foot pain   . Attention and concentration deficit   . History of hypertension   . Hypotension due to drugs   . Labile blood glucose   . Hypokalemia   . Essential hypertension   . Controlled type 2 diabetes mellitus with hyperglycemia, without long-term current use of insulin ([place])   . ICH (intracerebral hemorrhage) ([place]) 06/23/2020  . Pelvic floor relaxation 03/07/2020  . Screening for colorectal cancer 12/09/2018  . 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 Snoring 06/08/2018  . Poor sleep hygiene 06/08/2018  . Chronic insomnia 06/08/2018  . Encounter for well woman exam with routine gynecological exam 09/09/2017  . Female cystocele 09/09/2017  . Cystocele 07/12/2015  . Precordial pain 02/18/2013  . Type 2 diabetes mellitus ([place]) 02/18/2013  . Hyperlipidemia  02/17/2013  . Essential hypertension, benign 02/17/2013   Larey Seat OT, MOT  Larey Seat 08/09/2020, 4:20 PM  [facility] [address] [place], [place], [address] Phone: [phone]   Fax:  [phone]  Name: SHARYL PANCHAL MRN: [id number] Date of Birth: 11/02/1950

## 2020-08-09 NOTE — Progress Notes (Signed)
Chief Complaint  Patient presents with  . Pessary Check    Had stroke 2/4//22    Blood pressure 112/70, pulse 82, height 5\' 6"  (1.676 m), weight 204 lb (92.5 kg).  [name] presents today for routine follow up related to her pessary.   She uses a Milex ring with support #4 She reports no vaginal discharge and no vaginal bleeding   Likert scale(1 not bothersome -5 very bothersome)  :  1  Exam reveals no undue vaginal mucosal pressure of breakdown, no discharge and no vaginal bleeding.  Vaginal Epithelial Abnormality Classification System:   0 0    No abnormalities 1    Epithelial erythema 2    Granulation tissue 3    Epithelial break or erosion, 1 cm or less 4    Epithelial break or erosion, 1 cm or greater  The pessary is removed, cleaned and replaced without difficulty.      ICD-10-CM   1. Pessary maintenance, Milex ring with support #4, original fit 11/21  Z46.89    Removed/cleaned/replaced: doing well no complaints, wants to continue  2. POP-Q stage 3 cystocele: chronic/stable, well managed with Pessary  N81.10   3. 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  Florian Buff, MD  08/09/2020 11:27 AM

## 2020-08-15 ENCOUNTER — Other Ambulatory Visit: Payer: Self-pay

## 2020-08-15 ENCOUNTER — Encounter (HOSPITAL_COMMUNITY): Payer: Self-pay

## 2020-08-15 ENCOUNTER — Ambulatory Visit (HOSPITAL_COMMUNITY): Payer: Medicare Other | Admitting: Speech Pathology

## 2020-08-15 ENCOUNTER — Ambulatory Visit (HOSPITAL_COMMUNITY): Payer: Medicare Other

## 2020-08-15 ENCOUNTER — Encounter (HOSPITAL_COMMUNITY): Payer: Self-pay | Admitting: Speech Pathology

## 2020-08-15 DIAGNOSIS — R29898 Other symptoms and signs involving the musculoskeletal system: Secondary | ICD-10-CM | POA: Diagnosis not present

## 2020-08-15 DIAGNOSIS — R262 Difficulty in walking, not elsewhere classified: Secondary | ICD-10-CM

## 2020-08-15 DIAGNOSIS — R41841 Cognitive communication deficit: Secondary | ICD-10-CM

## 2020-08-15 DIAGNOSIS — M6281 Muscle weakness (generalized): Secondary | ICD-10-CM

## 2020-08-15 NOTE — Therapy (Signed)
[place], [place], [address] Phone: [phone]   Fax:  [phone]  Speech Language Pathology Treatment  Patient Details  Name: Cheryl Ortiz MRN: [id number] Date of Birth: November 15, 1950 Referring Provider (SLP): Reesa Chew, PA-C   Encounter Date: 08/15/2020   End of Session - 08/15/20 1110    Visit Number 3    Number of Visits 5    Date for SLP Re-Evaluation 09/03/20    Authorization Type Medicare    SLP Start Time 1055    SLP Stop Time  1130    SLP Time Calculation (min) 35 min    Activity Tolerance Patient tolerated treatment well           Past Medical History:  Diagnosis Date  . Aneurysm ([place]) 06/2015   brain-small will recheck in 1 year  . Cold   . Cystocele 07/12/2015  . Gout   . Hyperlipidemia   . Hypertension   . Neuropathy of both feet    tingling/numbness bilateral feet ,rt arm,hands-nerve study scheduled  . Osteoarthritis   . Renal disorder    kidney stones  . Seasonal allergies   . Stroke ([place])   . Type 2 diabetes mellitus ([place])     Past Surgical History:  Procedure Laterality Date  . CHOLECYSTECTOMY  1999  . COLONOSCOPY N/A 06/15/2014   Procedure: COLONOSCOPY;  Surgeon: Rogene Houston, MD;  [facility];  Service: Endoscopy;  Laterality: N/A;  1145  . CYSTOSCOPY WITH RETROGRADE PYELOGRAM, URETEROSCOPY AND STENT PLACEMENT Bilateral 08/20/2015   Procedure: CYSTOSCOPY WITH RETROGRADE PYELOGRAM, URETEROSCOPY AND STENT PLACEMENT WITH STONE EXTRACTION WITH BASKET;  Surgeon: Cleon Gustin, MD;  Location[facility];  Service: Urology;  Laterality: Bilateral;  . ESOPHAGEAL DILATION N/A 03/23/2015   Procedure: ESOPHAGEAL DILATION;  Surgeon: Rogene Houston, MD;  [facility];  Service: Endoscopy;  Laterality: N/A;  . ESOPHAGOGASTRODUODENOSCOPY N/A 03/23/2015   Procedure: ESOPHAGOGASTRODUODENOSCOPY (EGD);  Surgeon: Rogene Houston, MD;  [facility];  Service: Endoscopy;  Laterality: N/A;  8:55 - moved to 12:35 - Ann to notify pt  . FOOT SURGERY Bilateral   . HOLMIUM LASER APPLICATION Bilateral 09/21/3873   Procedure: HOLMIUM LASER APPLICATION;  Surgeon: Cleon Gustin, MD;  Location: [facility];  Service: Urology;  Laterality: Bilateral;  . VAGINAL HYSTERECTOMY  1984    There were no vitals filed for this visit.    ADULT SLP TREATMENT - 08/15/20 0001      General Information   Behavior/Cognition Alert;Cooperative;Pleasant mood    Patient Positioning Upright in chair    Oral care provided N/A    HPI Cheryl Ortiz presented to [facility] emergency room on 06/22/2020 with complaint of confusion and altered mental status. She was transferred to [facility]. Neurology Consulted. Cheryl Ortiz was admitted to inpatient rehabilitation on 06/25/2020 for treatment for her right frontal ICH and discharged home on 07/19/2020. She was referred for outpatient SLP by Reesa Chew, PA-C.      Treatment Provided   Treatment provided Cognitive-Linquistic      Pain Assessment   Pain Assessment No/denies pain      Cognitive-Linquistic Treatment   Treatment focused on Cognition;Patient/family/caregiver education    Skilled Treatment SLP provided skilled SLP treatment by facilitating attention, planning, and memory tasks. SLP provided initial verbal cue to utilize compensatory strategies as needed.     Assessment / Recommendations / Plan  Plan Continue with current plan of care;All goals met;Discharge SLP treatment due to (comment)      Progression Toward Goals   Progression toward goals Progressing toward goals              SLP Short Term Goals - 08/15/20 1111      SLP SHORT TERM GOAL #1   Title Pt will implement memory strategies in functional therapy activities with 90% acc with min cues.    Baseline 80%    Time 4    Period Weeks    Status Achieved    Target Date 09/03/20      SLP SHORT TERM GOAL #2    Title Pt will complete moderate-level thought organization and planning activities with 95% acc and min assist.    Baseline 80%    Time 4    Period Weeks    Status Achieved    Target Date 09/03/20            SLP Long Term Goals - 08/15/20 1111      SLP LONG TERM GOAL #1   Title Same as short            Plan - 08/15/20 1111    Clinical Impression Statement Pt continues to report improvements in all areas related to sequelae of stroke. She completed daily memory task and filled out weekly plan with goals and self evaluation. She indicates that she no longer needs assist or supervision with cognitive tasks at home. She is eager to get back to driving and plans to discuss with the doctor this Friday. In session, she completed attention task which required her to sort cards by suit and then in numerical order while participating in conversation with SLP. Pt completed with 100% acc and had no difficulty communicating complex thoughts. She then completed 10-item word recall task with use of association cues with 8/10 recall on the first trial and 10/10 on the second trial. She was then able to recall 7/10 words without verbal association prompt. Pt indicates that she uses association cues and written cues to increase recall outside of therapy. Pt is pleased with her progress and current level of function. She met her goals and will be discharged from SLP services.     Speech Therapy Frequency 1x /week    Duration --    Treatment/Interventions Compensatory strategies;Patient/family education;Cueing hierarchy;Cognitive reorganization;SLP instruction and feedback    Potential to Achieve Goals Good    SLP Home Exercise Plan Pt will completed HEP as assigned to facilitate carryover of treatment strategies and techniques in home environment    Consulted and Agree with Plan of Care Patient           Patient will benefit from skilled therapeutic intervention in order to improve the following  deficits and impairments:   Cognitive communication deficit    Problem List Patient Active Problem List   Diagnosis Date Noted  . Pressure injury of skin 07/18/2020  . Labile blood pressure   . Left foot pain   . Attention and concentration deficit   . History of hypertension   . Hypotension due to drugs   . Labile blood glucose   . Hypokalemia   . Essential hypertension   . Controlled type 2 diabetes mellitus with hyperglycemia, without long-term current use of insulin ([place])   . ICH (intracerebral hemorrhage) ([place]) 06/23/2020  . Pelvic floor relaxation 03/07/2020  . Screening for colorectal cancer 12/09/2018  . 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 Snoring 06/08/2018  . Poor sleep hygiene 06/08/2018  . Chronic insomnia 06/08/2018  . Encounter for well woman exam with routine gynecological exam 09/09/2017  . Female cystocele 09/09/2017  . Cystocele 07/12/2015  . Precordial pain 02/18/2013  . Type 2 diabetes mellitus ([place]) 02/18/2013  . Hyperlipidemia 02/17/2013  . Essential hypertension, benign 02/17/2013   SPEECH THERAPY DISCHARGE SUMMARY  Visits from Start of Care: 3  Current functional level related to goals / functional outcomes: Goals met   Remaining deficits: N/A   Education / Equipment: Completed  Plan: Patient agrees to discharge.  Patient goals were met. Patient is being discharged due to meeting the stated rehab goals.  ?????         Thank you,  Genene Churn, [place]  [facility] 08/15/2020, 11:39 AM  [place] [address] [place], [place], [address] Phone: [phone]   Fax:  [phone]   Name: Cheryl Ortiz MRN: [id number] Date of Birth: 12-Apr-1951

## 2020-08-15 NOTE — Therapy (Signed)
[facility] [address] [place], [place], [address] Phone: [phone]   Fax:  [phone]  Physical Therapy Treatment  Patient Details  Name: Cheryl Ortiz MRN: [id number] Date of Birth: [birth year] Referring Provider (PT): Reesa Chew   Encounter Date: 08/15/2020   PT End of Session - 08/15/20 1137    Visit Number 2    Number of Visits 12    Date for PT Re-Evaluation 09/12/20    Authorization Type medicare    Progress Note Due on Visit 10    PT Start Time 1941    PT Stop Time 1213    PT Time Calculation (min) 38 min    Activity Tolerance Patient tolerated treatment well    Behavior During Therapy [facility] for tasks assessed/performed           Past Medical History:  Diagnosis Date  . Aneurysm ([place]) 06/2015   brain-small will recheck in 1 year  . Cold   . Cystocele 07/12/2015  . Gout   . Hyperlipidemia   . Hypertension   . Neuropathy of both feet    tingling/numbness bilateral feet ,rt arm,hands-nerve study scheduled  . Osteoarthritis   . Renal disorder    kidney stones  . Seasonal allergies   . Stroke ([place])   . Type 2 diabetes mellitus ([place])     Past Surgical History:  Procedure Laterality Date  . CHOLECYSTECTOMY  1999  . COLONOSCOPY N/A 06/15/2014   Procedure: COLONOSCOPY;  Surgeon: Rogene Houston, MD;  [facility];  Service: Endoscopy;  Laterality: N/A;  1145  . CYSTOSCOPY WITH RETROGRADE PYELOGRAM, URETEROSCOPY AND STENT PLACEMENT Bilateral 08/20/2015   Procedure: CYSTOSCOPY WITH RETROGRADE PYELOGRAM, URETEROSCOPY AND STENT PLACEMENT WITH STONE EXTRACTION WITH BASKET;  Surgeon: Cleon Gustin, MD;  Location: [facility];  Service: Urology;  Laterality: Bilateral;  . ESOPHAGEAL DILATION N/A 03/23/2015   Procedure: ESOPHAGEAL DILATION;  Surgeon: Rogene Houston, MD;  [facility];  Service: Endoscopy;  Laterality: N/A;  . ESOPHAGOGASTRODUODENOSCOPY N/A 03/23/2015   Procedure:  ESOPHAGOGASTRODUODENOSCOPY (EGD);  Surgeon: Rogene Houston, MD;  [facility];  Service: Endoscopy;  Laterality: N/A;  8:55 - moved to 12:35 - Ann to notify pt  . FOOT SURGERY Bilateral   . HOLMIUM LASER APPLICATION Bilateral 11/19/812   Procedure: HOLMIUM LASER APPLICATION;  Surgeon: Cleon Gustin, MD;  Location: [facility];  Service: Urology;  Laterality: Bilateral;  . VAGINAL HYSTERECTOMY  1984    There were no vitals filed for this visit.   Subjective Assessment - 08/15/20 1121    Subjective Pt stated she is feeling wonderful today.  Ambulated with SPC and no reports of pain or recent falls.    Patient Stated Goals to Drive, go shopping, housework, care taker for her bother.    Currently in Pain? No/denies                             [facility] Adult PT Treatment/Exercise - 08/15/20 0001      Exercises   Exercises Knee/Hip      Knee/Hip Exercises: Standing   Heel Raises 15 reps    Heel Raises Limitations toe raises    Forward Lunges 15 reps    Forward Lunges Limitations 6in step height    Forward Step Up 10 reps;Step Height: 4";Hand Hold: 0;Both    Step Down Both;10 reps;Hand Hold: 1;Step Height:  4"    Gait Training Gait training no AD x 200 ft    Other Standing Knee Exercises tandem stance 1x 30", on foam 2x 30" intermittent HHA                  PT Education - 08/15/20 1142    Education Details Reviewed goals, educated importance of HEP compliance, pt verbalized compliance and reports of ease with STS    Person(s) Educated Patient    Methods Explanation;Demonstration    Comprehension Verbalized understanding;Returned demonstration            PT Short Term Goals - 08/01/20 1209      PT SHORT TERM GOAL #1   Title PT to be I in HEP to improve strength of LE to be able to come sit to stand from a low couch with ease.    Time 3    Period Weeks    Status New    Target Date 08/22/20      PT SHORT TERM GOAL #2    Title PT to be able to single leg stance for 10 seconds B to allow pt to feel confident walking with a cane.    Time 3    Period Weeks    Status New      PT SHORT TERM GOAL #3   Title Pt LE strength to be at least 4+/5 to allow pt to be able to go up and down 6 steps in a reciprocal manner using a handrail    Time 3    Period Weeks             PT Long Term Goals - 08/01/20 1211      PT LONG TERM GOAL #1   Title PT to be I in an advance HEP to allow pt to return to prior level of walking without assistive device.    Time 6    Period Weeks    Status New    Target Date 09/12/20      PT LONG TERM GOAL #2   Title PT LE and core strength to be 5/5 to allow pt to easily get in and out of a car.    Time 6    Period Weeks    Status New      PT LONG TERM GOAL #3   Title PT to be able to stand/walk for two hour without having any increase swelling or pain in her LT ankle.    Time 6    Period Weeks    Status New                 Plan - 08/15/20 1502    Clinical Impression Statement Reviewed goals and educated benefits with HEP compliance for maximal benefits with therapy.  Pt able to recall and reports compliance with HEP.  Session focus with functional strengthening, balance training and progressed to gait with no AD.  Pt progressing well towards all goals and ability to demonstrate gait mechanics WNL and no LOB and good arm swing.  Most difficulty wiht step down training due to weakness.  Progressed to dynamic surface with tandem stance wiht intermittent HHA required.  No reports of pain through session.  Advanced HEP with additional exercises, encouraged to complete balance activities near counter for safety, verbalized understanding.    Personal Factors and Comorbidities Fitness    Examination-Activity Limitations Caring for Others;Carry;Locomotion Level;Dressing;Stairs    Examination-Participation Restrictions Church;Community Activity;Driving;Laundry;Meal Prep  Stability/Clinical Decision Making Evolving/Moderate complexity    Clinical Decision Making Moderate    Rehab Potential Good    PT Frequency 2x / week    PT Duration 6 weeks    PT Treatment/Interventions ADLs/Self Care Home Management;Gait training;DME Instruction;Stair training;Functional mobility training;Therapeutic activities;Therapeutic exercise;Balance training;Neuromuscular re-education;Patient/family education;Manual techniques    PT Next Visit Plan Add SLS, vector stance and squats.  Progress functional strengthening wiht increase step up height, stay at 4in for step down.  Progress as able.    PT Home Exercise Plan sitting: LAQ, ankle dorsi/plantarflexion,sit to stand, supine: SLR< bridge. sidelying: abduction; 3/30: heel/toe raises and tandem stance by counter.    Consulted and Agree with Plan of Care Patient           Patient will benefit from skilled therapeutic intervention in order to improve the following deficits and impairments:  Abnormal gait,Decreased activity tolerance,Decreased balance,Difficulty walking,Decreased strength,Pain  Visit Diagnosis: Muscle weakness (generalized)  Difficulty in walking, not elsewhere classified     Problem List Patient Active Problem List   Diagnosis Date Noted  . Pressure injury of skin 07/18/2020  . Labile blood pressure   . Left foot pain   . Attention and concentration deficit   . History of hypertension   . Hypotension due to drugs   . Labile blood glucose   . Hypokalemia   . Essential hypertension   . Controlled type 2 diabetes mellitus with hyperglycemia, without long-term current use of insulin ([place])   . ICH (intracerebral hemorrhage) ([place]) 06/23/2020  . Pelvic floor relaxation 03/07/2020  . Screening for colorectal cancer 12/09/2018  . 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 Snoring 06/08/2018  . Poor sleep hygiene 06/08/2018  . Chronic insomnia 06/08/2018  . Encounter for well woman exam with  routine gynecological exam 09/09/2017  . Female cystocele 09/09/2017  . Cystocele 07/12/2015  . Precordial pain 02/18/2013  . Type 2 diabetes mellitus ([place]) 02/18/2013  . Hyperlipidemia 02/17/2013  . Essential hypertension, benign 02/17/2013   Ihor Austin, LPTA/CLT; CBIS [phone]  Aldona Lento 08/15/2020, 3:21 PM  [place], [place], [address] Phone: [phone]   Fax:  [phone]  Name: Cheryl Ortiz MRN: [id number] Date of Birth: 1950-12-31

## 2020-08-15 NOTE — Patient Instructions (Signed)
Toe / Heel Raise (Standing)    Standing with support, raise heels, then rock back on heels and raise toes. Repeat 15 times.  Copyright  VHI. All rights reserved.    Tandem Stance    Right foot in front of left, heel touching toe both feet "straight ahead". Stand on Foot Triangle of Support with both feet.  Balance in this position 30 seconds.  3 sets. Do with left foot in front of right.  Copyright  VHI. All rights reserved.

## 2020-08-17 ENCOUNTER — Encounter: Payer: Self-pay | Admitting: Neurology

## 2020-08-17 ENCOUNTER — Other Ambulatory Visit: Payer: Self-pay

## 2020-08-17 ENCOUNTER — Ambulatory Visit (INDEPENDENT_AMBULATORY_CARE_PROVIDER_SITE_OTHER): Payer: Medicare Other | Admitting: Neurology

## 2020-08-17 VITALS — BP 135/80 | HR 81 | Ht 66.0 in | Wt 209.0 lb

## 2020-08-17 DIAGNOSIS — I611 Nontraumatic intracerebral hemorrhage in hemisphere, cortical: Secondary | ICD-10-CM | POA: Diagnosis not present

## 2020-08-17 MED ORDER — ALPRAZOLAM 0.5 MG PO TABS: ORAL_TABLET | ORAL | 0 refills | Status: DC

## 2020-08-17 NOTE — Progress Notes (Signed)
Reason for visit: Lobar hemorrhage, right frontal  Referring physician: [facility]  Cheryl Ortiz is a 70 y.o. female  History of present illness:  Cheryl Ortiz is a 70 year old right-handed black female with a history of diabetes and hypertension.  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  The patient was admitted to the hospital on 22 June 2020 when her family noted that she was not acting properly.  The patient appeared to be abulic, she had a monotone speech and appeared to be somewhat forgetful, and she was slightly unsteady with her walking.  She went to the hospital and a CT scan of the brain showed evidence of a right frontal hemorrhage.  She was admitted for further evaluation.  CT angiogram of the head and neck, and MR venogram did not show any source of the intracranial hemorrhage.  The patient underwent a hospitalization for rehab, she has been at home, she is doing well at this point.  She has minimal residual, her personality has returned to normal and she has slight gait instability but otherwise she has good strength throughout.  She may have occasional headaches in the back of the headache but this was occurring before the hemorrhage.  The headaches are occurring about every other day.  She denies any visual complaints or difficulties with swallowing or with slurring her speech.  The patient has no numbness of the extremities.  Initially, she had some incontinence of the bowels and bladder, but this has resolved.  She was on aspirin at the time of the hemorrhage, she has been taken off of this.  She is sent to this office for an evaluation.  Past Medical History:  Diagnosis Date  . Aneurysm ([place]) 06/2015   brain-small will recheck in 1 year  . Cold   . Cystocele 07/12/2015  . Gout   . Hyperlipidemia   . Hypertension   . Neuropathy of both feet    tingling/numbness bilateral  feet ,rt arm,hands-nerve study scheduled  . Osteoarthritis   . Renal disorder    kidney stones  . Seasonal allergies   . Stroke ([place])   . Type 2 diabetes mellitus ([place])     Past Surgical History:  Procedure Laterality Date  . CHOLECYSTECTOMY  1999  . COLONOSCOPY N/A 06/15/2014   Procedure: COLONOSCOPY;  Surgeon: Rogene Houston, MD;  [facility];  Service: Endoscopy;  Laterality: N/A;  1145  . CYSTOSCOPY WITH RETROGRADE PYELOGRAM, URETEROSCOPY AND STENT PLACEMENT Bilateral 08/20/2015   Procedure: CYSTOSCOPY WITH RETROGRADE PYELOGRAM, URETEROSCOPY AND STENT PLACEMENT WITH STONE EXTRACTION WITH BASKET;  Surgeon: Cleon Gustin, MD;  Location[facility];  Service: Urology;  Laterality: Bilateral;  . ESOPHAGEAL DILATION N/A 03/23/2015   Procedure: ESOPHAGEAL DILATION;  Surgeon: Rogene Houston, MD;  [facility];  Service: Endoscopy;  Laterality: N/A;  . ESOPHAGOGASTRODUODENOSCOPY N/A 03/23/2015   Procedure: ESOPHAGOGASTRODUODENOSCOPY (EGD);  Surgeon: Rogene Houston, MD;  [facility];  Service: Endoscopy;  Laterality: N/A;  8:55 - moved to 12:35 - Ann to notify pt  . FOOT SURGERY Bilateral   . HOLMIUM LASER APPLICATION Bilateral 05/26/8414   Procedure: HOLMIUM LASER APPLICATION;  Surgeon: Cleon Gustin, MD;  Location: [facility];  Service: Urology;  Laterality: Bilateral;  . VAGINAL HYSTERECTOMY  1984    Family History  Problem Relation Age of Onset  .  Hypertension Mother   . Arthritis Mother   . Cancer Mother        breast   . Cerebral palsy Brother   . Early death Brother   . Hypertension Brother   . Heart disease Brother   . Pneumonia Father   . Hypertension Father   . Hyperlipidemia Sister   . Breast cancer Sister   . Fibroids Sister   . Hypertension Brother   . Heart attack Brother     Social history:  reports that she quit smoking about 23 years ago. Her smoking use included cigarettes. She has a  6.00 pack-year smoking history. She has never used smokeless tobacco. She reports previous alcohol use. She reports that she does not use drugs.  Medications:  Prior to Admission medications   Medication Sig Start Date End Date Taking? Authorizing Provider  albuterol (VENTOLIN HFA) 108 (90 Base) MCG/ACT inhaler Inhale 2 puffs into the lungs every 4 (four) hours as needed for wheezing or shortness of breath. 07/18/20  Yes Angiulli, Lavon Paganini, PA-C  diclofenac Sodium (VOLTAREN) 1 % GEL Apply 2 g topically 3 (three) times daily as needed.   Yes [provider]  furosemide (LASIX) 40 MG tablet Take 40 mg by mouth daily as needed. 07/31/20  Yes [provider]  Lancet Devices ([employer] LANCING) [place] USE LANCING DEVICE TWICE DAILY 07/25/20  Yes [provider]  lidocaine (LIDODERM) 5 % Place 1 patch onto the skin daily. Remove & Discard patch within 12 hours or as directed by MD 07/18/20  Yes Angiulli, Lavon Paganini, PA-C  metFORMIN (GLUCOPHAGE) 1000 MG tablet Take 1 tablet (1,000 mg total) by mouth daily with breakfast. 07/18/20  Yes Angiulli, Lavon Paganini, PA-C  Multiple Vitamin (MULTI-VITAMIN DAILY PO)     [provider]      Allergies  Allergen Reactions  . Codeine Other (See Comments)    Sensitivity, "makes high" Other reaction(s): Messes with mind, Unknown  . Prednisone Other (See Comments)    High blood sugar. Other reaction(s): Unknown    ROS:  Out of a complete 14 system review of symptoms, the patient complains only of the following symptoms, and all other reviewed systems are negative.  Recent stroke Memory problems  Blood pressure 135/80, pulse 81, height 5\' 6"  (1.676 m), weight 209 lb (94.8 kg).  Physical Exam  General: The patient is alert and cooperative at the time of the examination.  The patient is moderately obese.  Eyes: Pupils are equal, round, and reactive to light. Discs are flat bilaterally.  Neck: The neck is supple, no carotid  bruits are noted.  Respiratory: The respiratory examination is clear.  Cardiovascular: The cardiovascular examination reveals a regular rate and rhythm, no obvious murmurs or rubs are noted.  Skin: Extremities are without significant edema.  Neurologic Exam  Mental status: The patient is alert and oriented x 3 at the time of the examination. The patient has apparent normal recent and remote memory, with an apparently normal attention span and concentration ability.  Cranial nerves: Facial symmetry is present. There is good sensation of the face to pinprick and soft touch bilaterally. The strength of the facial muscles and the muscles to head turning and shoulder shrug are normal bilaterally. Speech is well enunciated, no aphasia or dysarthria is noted. Extraocular movements are full. Visual fields are full. The tongue is midline, and the patient has symmetric elevation of the soft palate. No obvious hearing deficits are noted.  Motor: The  motor testing reveals 5 over 5 strength of all 4 extremities. Good symmetric motor tone is noted throughout.  Sensory: Sensory testing is intact to pinprick, soft touch, vibration sensation, and position sense on all 4 extremities. No evidence of extinction is noted.  Coordination: Cerebellar testing reveals good finger-nose-finger and heel-to-shin bilaterally.  Gait and station: Gait is normal.  The patient usually uses a cane for ambulation.  Tandem gait is unsteady.  Romberg is negative. No drift is seen.  Reflexes: Deep tendon reflexes are symmetric and normal bilaterally. Toes are downgoing bilaterally.   CT Angio Head W or Wo Contrast  06/23/2020 IMPRESSION:  1. No sign of high flow vascular malformation in the region of the right frontal hemorrhage. No sign of venous thrombosis. Cortical venous thrombosis can be difficult to see by CT.  2. No change in the appearance of the right frontal lobe intraparenchymal hemorrhage with mild surrounding edema.  No mass effect or shift.   CT Head Wo Contrast 06/23/2020 IMPRESSION:  Area of hemorrhage within the anteromedial right frontal lobe. Adjacent small amount of blood to the left side of the falx could be within the left frontal lobe or subarachnoid blood. No mass effect or midline shift.  CT Angio Neck W and/or Wo Contrast 06/23/2020 IMPRESSION:  1. Minimal calcified plaque at the right ICA bulb but no stenosis.  2. No left carotid bifurcation disease.  3. Both vertebral arteries appear normal through the cervical region to the foramen magnum.  4. Aortic atherosclerosis. Aortic Atherosclerosis (ICD10-I70.0).   CT VENOGRAM HEAD 06/23/2020 IMPRESSION:  No visible venous thrombosis. Superficial venous thrombosis can be difficult to see by CT.   MR BRAIN W WO CONTRAST 06/23/2020 IMPRESSION:  1. Right frontal (and smaller left frontal) intraparenchymal hemorrhages, likely similar in size when comparing across modalities. Small volume of adjacent hemorrhage along the falx and suspected trace right frontal subarachnoid hemorrhage. No surrounding restricted diffusion to suggest hemorrhagic transformation of infarct. No visible mass lesion, although acute blood products limit evaluation. A follow-up MRI after resolution of hemorrhage could further evaluate for underlying abnormality.  2. 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   * MRI scan images were reviewed online. I agree with the written report.    Assessment/Plan:  1.  Lobar hemorrhage, right frontal  2.  Diabetes  3.  Hypertension  4.  Memory disturbance  The patient will be sent for a repeat MRI of the brain with and without contrast and for MRA of the head in about 6 to 8 weeks.  The patient will follow up in about 5 months, we will follow the memory issues over time.  The patient fortunately has minimal residual from the hemorrhage.  Jill Alexanders MD 08/17/2020 10:09 AM  [facility] [address] [place],  [address]  Phone [phone] Fax [phone]

## 2020-08-22 ENCOUNTER — Encounter (HOSPITAL_COMMUNITY): Payer: Medicare Other | Admitting: Speech Pathology

## 2020-08-22 ENCOUNTER — Other Ambulatory Visit: Payer: Self-pay

## 2020-08-22 ENCOUNTER — Ambulatory Visit (HOSPITAL_COMMUNITY): Payer: Medicare Other | Attending: Physical Medicine and Rehabilitation | Admitting: Physical Therapy

## 2020-08-22 DIAGNOSIS — R262 Difficulty in walking, not elsewhere classified: Secondary | ICD-10-CM | POA: Insufficient documentation

## 2020-08-22 DIAGNOSIS — M6281 Muscle weakness (generalized): Secondary | ICD-10-CM | POA: Diagnosis not present

## 2020-08-22 DIAGNOSIS — R29898 Other symptoms and signs involving the musculoskeletal system: Secondary | ICD-10-CM | POA: Insufficient documentation

## 2020-08-22 NOTE — Therapy (Signed)
[facility] [address] [place], [place], [address] Phone: [phone]   Fax:  [phone]  Physical Therapy Treatment  Patient Details  Name: Cheryl Ortiz MRN: [id number] Date of Birth: June 22, 1950 Referring Provider (PT): Reesa Chew  PHYSICAL THERAPY DISCHARGE SUMMARY  Visits from Start of Care: 3  Current functional level related to goals / functional outcomes: HEP Remaining deficits: Gluteal strength    Education / Equipment: HEP Plan: Patient agrees to discharge.  Patient goals were met. Patient is being discharged due to meeting the stated rehab goals.  ?????       Encounter Date: 08/22/2020   PT End of Session - 08/22/20 1049    Visit Number 3    Number of Visits 3    Date for PT Re-Evaluation 09/12/20    Authorization Type medicare    Progress Note Due on Visit 3    PT Start Time 1005    PT Stop Time 1045    PT Time Calculation (min) 40 min    Activity Tolerance Patient tolerated treatment well    Behavior During Therapy WFL for tasks assessed/performed           Past Medical History:  Diagnosis Date  . Aneurysm ([facility]) 06/2015   brain-small will recheck in 1 year  . Cold   . Cystocele 07/12/2015  . Gout   . Hyperlipidemia   . Hypertension   . Neuropathy of both feet    tingling/numbness bilateral feet ,rt arm,hands-nerve study scheduled  . Osteoarthritis   . Renal disorder    kidney stones  . Seasonal allergies   . Stroke ([place])   . Type 2 diabetes mellitus ([place])     Past Surgical History:  Procedure Laterality Date  . CHOLECYSTECTOMY  1999  . COLONOSCOPY N/A 06/15/2014   Procedure: COLONOSCOPY;  Surgeon: Rogene Houston, MD;  [facility];  Service: Endoscopy;  Laterality: N/A;  1145  . CYSTOSCOPY WITH RETROGRADE PYELOGRAM, URETEROSCOPY AND STENT PLACEMENT Bilateral 08/20/2015   Procedure: CYSTOSCOPY WITH RETROGRADE PYELOGRAM, URETEROSCOPY AND STENT PLACEMENT WITH STONE EXTRACTION  WITH BASKET;  Surgeon: Cleon Gustin, MD;  Location: [facility];  Service: Urology;  Laterality: Bilateral;  . ESOPHAGEAL DILATION N/A 03/23/2015   Procedure: ESOPHAGEAL DILATION;  Surgeon: Rogene Houston, MD;  [facility];  Service: Endoscopy;  Laterality: N/A;  . ESOPHAGOGASTRODUODENOSCOPY N/A 03/23/2015   Procedure: ESOPHAGOGASTRODUODENOSCOPY (EGD);  Surgeon: Rogene Houston, MD;  [facility];  Service: Endoscopy;  Laterality: N/A;  8:55 - moved to 12:35 - Ann to notify pt  . FOOT SURGERY Bilateral   . HOLMIUM LASER APPLICATION Bilateral 08/17/6604   Procedure: HOLMIUM LASER APPLICATION;  Surgeon: Cleon Gustin, MD;  Location: [facility];  Service: Urology;  Laterality: Bilateral;  . VAGINAL HYSTERECTOMY  1984    There were no vitals filed for this visit.       [facility] PT Assessment - 08/22/20 0001      Assessment   Medical Diagnosis Rt CVA with Lt weakness    Referring Provider (PT) Reesa Chew    Onset Date/Surgical Date 06/22/20    Prior Therapy acute and IP      Precautions   Precautions None      Restrictions   Weight Bearing Restrictions No      Prior Function   Level of Independence Independent      Cognition   Overall Cognitive Status Within  Functional Limits for tasks assessed      Observation/Other Assessments   Focus on Therapeutic Outcomes (FOTO)  --   foto set for UE     Functional Tests   Functional tests Single leg stance;Sit to Stand      Single Leg Stance   Comments Rt:16" was  2 "; Lt 35 "  0"      Sit to Stand   Comments 11 in 30 seconds 8 in 30 seconds      Strength   Right Hip Flexion 5/5    Right Hip Extension 3+/5   was 3+   Right Hip ABduction 5/5    Left Hip Flexion 5/5   was 4/5   Left Hip Extension 3+/5   was 3   Left Hip ABduction 4+/5   was 3/5   Right Knee Flexion 4/5    Right Knee Extension 5/5    Left Knee Flexion 4/5   was 3+   Left Knee Extension 5/5   was 4/5    Right Ankle Dorsiflexion 5/5    Left Ankle Dorsiflexion 5/5      Bed Mobility   Bed Mobility --   I     Ambulation/Gait   Gait Comments Pt ambulates 400 ft in 2 min with no assistive device.Hulan Fess Adult PT Treatment/Exercise - 08/22/20 0001      Knee/Hip Exercises: Standing   SLS X 3 b      Knee/Hip Exercises: Seated   Sit to Sand 10 reps                    PT Short Term Goals - 08/22/20 1032      PT SHORT TERM GOAL #1   Title PT to be I in HEP to improve strength of LE to be able to come sit to stand from a low couch with ease.    Time 3    Period Weeks    Status Achieved    Target Date 08/22/20      PT SHORT TERM GOAL #2   Title PT to be able to single leg stance for 10 seconds B to allow pt to feel confident walking with a cane.    Time 3    Period Weeks    Status Achieved      PT SHORT TERM GOAL #3   Title Pt LE strength to be at least 4+/5 to allow pt to be able to go up and down 6 steps in a reciprocal manner using a handrail    Time 3    Period Weeks    Status Achieved   except for hip extensors B            PT Long Term Goals - 08/22/20 1033      PT LONG TERM GOAL #1   Title PT to be I in an advance HEP to allow pt to return to prior level of walking without assistive device.    Status Achieved      PT LONG TERM GOAL #2   Title PT LE and core strength to be 5/5 to allow pt to easily get in and out of a car.    Status Achieved      PT LONG TERM GOAL #3   Title PT to be able to stand/walk for two hour  without having any increase swelling or pain in her LT ankle.    Status Achieved                 Plan - 08/22/20 1050    Clinical Impression Statement Pt comes to the department walking without an assistive device.  States that she is back to driving and doing her own [employer].  Pt staes that she feels that she is back at prior functional level.  Pt reassessed with only deficit noted in  single leg stance on her Rt LE as  well as B gluteal maximus weakness.  Pt states that she has had chronic LBP, therefore, it is very likely that gluteal weakness is a prior level of function,  PT HEP updated concentrating on gluteal strength only.  PT was advised to begin walking more as this is one of the best exercises that she can do for her heart.    Personal Factors and Comorbidities Fitness    Examination-Activity Limitations Caring for Others;Carry;Locomotion Level;Dressing;Stairs    Examination-Participation Restrictions Church;Community Activity;Driving;Laundry;Meal Prep    Stability/Clinical Decision Making Evolving/Moderate complexity    Rehab Potential Good    PT Frequency 2x / week    PT Duration 6 weeks    PT Treatment/Interventions ADLs/Self Care Home Management;Gait training;DME Instruction;Stair training;Functional mobility training;Therapeutic activities;Therapeutic exercise;Balance training;Neuromuscular re-education;Patient/family education;Manual techniques    PT Next Visit Plan Discharge as pt is at her prior level of function.    PT Home Exercise Plan sitting: LAQ, ankle dorsi/plantarflexion,sit to stand, supine: SLR< bridge. sidelying: abduction; 3/30: heel/toe raises and tandem stance by counter.    Consulted and Agree with Plan of Care Patient           Patient will benefit from skilled therapeutic intervention in order to improve the following deficits and impairments:  Abnormal gait,Decreased activity tolerance,Decreased balance,Difficulty walking,Decreased strength,Pain  Visit Diagnosis: Muscle weakness (generalized)  Difficulty in walking, not elsewhere classified  Other symptoms and signs involving the musculoskeletal system     Problem List Patient Active Problem List   Diagnosis Date Noted  . Pressure injury of skin 07/18/2020  . Labile blood pressure   . Left foot pain   . Attention and concentration deficit   . History of hypertension   .  Hypotension due to drugs   . Labile blood glucose   . Hypokalemia   . Essential hypertension   . Controlled type 2 diabetes mellitus with hyperglycemia, without long-term current use of insulin ([place])   . ICH (intracerebral hemorrhage) ([place]) 06/23/2020  . Pelvic floor relaxation 03/07/2020  . Screening for colorectal cancer 12/09/2018  . 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 Snoring 06/08/2018  . Poor sleep hygiene 06/08/2018  . Chronic insomnia 06/08/2018  . Encounter for well woman exam with routine gynecological exam 09/09/2017  . Female cystocele 09/09/2017  . Cystocele 07/12/2015  . Precordial pain 02/18/2013  . Type 2 diabetes mellitus ([place]) 02/18/2013  . Hyperlipidemia 02/17/2013  . Essential hypertension, benign 02/17/2013    Rayetta Humphrey, PT CLT [phone] 08/22/2020, 10:59 AM  [facility] [address] [place], [place], [address] Phone: [phone]   Fax:  [phone]  Name: CHARMAINE PLACIDO MRN: [id number] Date of Birth: [birth year]

## 2020-08-23 ENCOUNTER — Encounter (HOSPITAL_COMMUNITY): Payer: Self-pay | Admitting: Occupational Therapy

## 2020-08-23 ENCOUNTER — Ambulatory Visit (HOSPITAL_COMMUNITY): Payer: Medicare Other | Admitting: Occupational Therapy

## 2020-08-23 DIAGNOSIS — R262 Difficulty in walking, not elsewhere classified: Secondary | ICD-10-CM | POA: Diagnosis not present

## 2020-08-23 DIAGNOSIS — R29898 Other symptoms and signs involving the musculoskeletal system: Secondary | ICD-10-CM

## 2020-08-23 DIAGNOSIS — M6281 Muscle weakness (generalized): Secondary | ICD-10-CM

## 2020-08-23 NOTE — Therapy (Addendum)
[place], [place], [address] Phone: [phone]   Fax:  [phone]  Occupational Therapy Reassessment & Discharge  Patient Details  Name: Cheryl Ortiz MRN: [id number] Date of Birth: 1951/04/19 Referring Provider (OT): Cheryl Ortiz, [place]   Encounter Date: 08/23/2020   OT End of Session - 08/23/20 1431    Visit Number 3    Number of Visits 4    Date for OT Re-Evaluation 08/29/20    Authorization Type 1) Medicare part A 2) [place]    Progress Note Due on Visit 10    OT Start Time 1350    OT Stop Time 1429    OT Time Calculation (min) 39 min    Activity Tolerance Patient tolerated treatment well    Behavior During Therapy Cheryl Ortiz for tasks assessed/performed           Past Medical History:  Diagnosis Date  . Aneurysm ([place]) 06/2015   brain-small will recheck in 1 year  . Cold   . Cystocele 07/12/2015  . Gout   . Hyperlipidemia   . Hypertension   . Neuropathy of both feet    tingling/numbness bilateral feet ,rt arm,hands-nerve study scheduled  . Osteoarthritis   . Renal disorder    kidney stones  . Seasonal allergies   . Stroke ([place])   . Type 2 diabetes mellitus ([place])     Past Surgical History:  Procedure Laterality Date  . CHOLECYSTECTOMY  1999  . COLONOSCOPY N/A 06/15/2014   Procedure: COLONOSCOPY;  Surgeon: Rogene Houston, MD;  [facility];  Service: Endoscopy;  Laterality: N/A;  1145  . CYSTOSCOPY WITH RETROGRADE PYELOGRAM, URETEROSCOPY AND STENT PLACEMENT Bilateral 08/20/2015   Procedure: CYSTOSCOPY WITH RETROGRADE PYELOGRAM, URETEROSCOPY AND STENT PLACEMENT WITH STONE EXTRACTION WITH BASKET;  Surgeon: Cleon Gustin, MD;  Location: [facility];  Service: Urology;  Laterality: Bilateral;  . ESOPHAGEAL DILATION N/A 03/23/2015   Procedure: ESOPHAGEAL DILATION;  Surgeon: Rogene Houston, MD;  [facility];  Service: Endoscopy;  Laterality: N/A;  .  ESOPHAGOGASTRODUODENOSCOPY N/A 03/23/2015   Procedure: ESOPHAGOGASTRODUODENOSCOPY (EGD);  Surgeon: Rogene Houston, MD;  [facility];  Service: Endoscopy;  Laterality: N/A;  8:55 - moved to 12:35 - Ann to notify pt  . FOOT SURGERY Bilateral   . HOLMIUM LASER APPLICATION Bilateral 06/27/9369   Procedure: HOLMIUM LASER APPLICATION;  Surgeon: Cleon Gustin, MD;  Location[facility];  Service: Urology;  Laterality: Bilateral;  . VAGINAL HYSTERECTOMY  1984    There were no vitals filed for this visit.   Subjective Assessment - 08/23/20 1348    Subjective  I am back doing everything.    Special Tests FOTO    Currently in Pain? No/denies              [facility] OT Assessment - 08/23/20 1348      Assessment   Medical Diagnosis Rt CVA with Lt weakness    Prior Therapy acute and IP      Precautions   Precautions None      Observation/Other Assessments   Focus on Therapeutic Outcomes (FOTO)  98/100      Strength   Strength Assessment Site Shoulder;Elbow;Forearm;Wrist    Right/Left Shoulder Left    Left Shoulder Flexion 4+/5    Left Shoulder ABduction 4+/5    Left Shoulder External Rotation 5/5    Right/Left Elbow Left    Left Elbow  Flexion 5/5    Left Elbow Extension 4+/5    Right/Left Forearm Left    Left Forearm Pronation 5/5    Left Forearm Supination 5/5    Right/Left Wrist Left    Left Wrist Flexion 5/5    Left Wrist Extension 5/5    Left Wrist Radial Deviation 5/5    Left Wrist Ulnar Deviation 5/5                    OT Treatments/Exercises (OP) - 08/23/20 0001      Exercises   Exercises Shoulder      Shoulder Exercises: Seated   Protraction AROM;12 reps;Weights    Protraction Weight (lbs) 3    Horizontal ABduction AAROM;12 reps;Weights    Horizontal ABduction Weight (lbs) 3    External Rotation AROM;12 reps;Weights    External Rotation Weight (lbs) 3    Internal Rotation AROM;12 reps;Weights    Internal Rotation Weight  (lbs) 3    Flexion AROM;12 reps;Weights    Flexion Weight (lbs) 3    Abduction AROM;12 reps;Weights    ABduction Weight (lbs) 3      Shoulder Exercises: ROM/Strengthening   UBE (Upper Arm Bike) level 7; 2' forward , 2' reverse.    Other ROM/Strengthening Exercises 3 # weight placement on overhead shelf x10.    Other ROM/Strengthening Exercises 90, 90 carry in standing for 2'   minimal downward drifting of arms.                 OT Education - 08/23/20 1611    Education Details Reviewd UE HEP's that have been given. Pt provided handout on advanced directives and aquatic therapy at the [facility] per previous request.    Person(s) Educated Patient    Methods Explanation;Handout    Comprehension Verbalized understanding            OT Short Term Goals - 08/23/20 1612      OT SHORT TERM GOAL #1   Title Pt will increase strength in LUE to 4+/5 to improve ability to perform lifting tasks required for functional ADL and IADL tasks.    Time 4    Period Weeks    Status Achieved    Target Date 08/29/20      OT SHORT TERM GOAL #2   Title Pt will be educated on HEP to improve ability to actively use LUE during functional task completion.    Time 4    Period Weeks    Status Achieved    Target Date 08/29/20      OT SHORT TERM GOAL #3   Title Pt will improve functional ADLs such as bathing and dressing to Mod I 75% or more of trials, per patient report and observation.    Time 4    Period Weeks    Status Achieved    Target Date 08/29/20                    Plan - 08/23/20 1609    Clinical Impression Statement A: Cheryl Ortiz was reassessed this date and demonstraed L shoulder and elbow MMT between 4+ or 5 in all areas tested. Pt reported that she is functionaing normally and doing all ADL's and IADL's independently. Pt is reportedly driving as well. Pt demonstrated good UE strength to complete A/ROM with 3# dumbbells and 90, 90 for 2' with 3# dumbbells. Pt able to use UBE for  a total of 4' at a level of 6 to  7 resistance.    OT Treatment/Interventions Self-care/ADL training;Moist Heat;DME and/or AE instruction;Balance training;Therapeutic activities;Therapeutic exercise;Passive range of motion;Manual Therapy;Patient/family education;Coping strategies training;Cognitive remediation/compensation    Plan P: Pt discharged this date due to goal attainment.    OT Home Exercise Plan 3/16 A/ROM shoulder; 3/24 shoulder theraband strengthening    Consulted and Agree with Plan of Care Patient           Patient will benefit from skilled therapeutic intervention in order to improve the following deficits and impairments:           Visit Diagnosis: Muscle weakness (generalized)  Other symptoms and signs involving the musculoskeletal system    Problem List Patient Active Problem List   Diagnosis Date Noted  . Pressure injury of skin 07/18/2020  . Labile blood pressure   . Left foot pain   . Attention and concentration deficit   . History of hypertension   . Hypotension due to drugs   . Labile blood glucose   . Hypokalemia   . Essential hypertension   . Controlled type 2 diabetes mellitus with hyperglycemia, without long-term current use of insulin ([place])   . ICH (intracerebral hemorrhage) ([place]) 06/23/2020  . Pelvic floor relaxation 03/07/2020  . Screening for colorectal cancer 12/09/2018  . 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 Snoring 06/08/2018  . Poor sleep hygiene 06/08/2018  . Chronic insomnia 06/08/2018  . Encounter for well woman exam with routine gynecological exam 09/09/2017  . Female cystocele 09/09/2017  . Cystocele 07/12/2015  . Precordial pain 02/18/2013  . Type 2 diabetes mellitus ([place]) 02/18/2013  . Hyperlipidemia 02/17/2013  . Essential hypertension, benign 02/17/2013   OCCUPATIONAL THERAPY DISCHARGE SUMMARY  Visits from Start of Care: 3  Current functional level related to goals / functional  outcomes: Independent    Remaining deficits: None   Education / Equipment: 3/16 A/ROM shoulder; 3/24 shoulder theraband strengthening; provided green theraband   Plan: Patient agrees to discharge.  Patient goals were met. Patient is being discharged due to meeting the stated rehab goals.  ?????      [facility] OT, MOT  Larey Seat 08/23/2020, 4:20 PM  [facility] [address] [place], [place], [address] Phone: [phone]   Fax:  [phone]  Name: Cheryl Ortiz MRN: [id number] Date of Birth: 1951-04-06

## 2020-08-27 ENCOUNTER — Ambulatory Visit (HOSPITAL_COMMUNITY): Payer: Medicare Other

## 2020-08-29 ENCOUNTER — Encounter (HOSPITAL_COMMUNITY): Payer: Medicare Other | Admitting: Speech Pathology

## 2020-08-29 ENCOUNTER — Ambulatory Visit (HOSPITAL_COMMUNITY): Payer: Medicare Other | Admitting: Physical Therapy

## 2020-08-29 ENCOUNTER — Encounter (HOSPITAL_COMMUNITY): Payer: Medicare Other | Admitting: Occupational Therapy

## 2020-09-04 ENCOUNTER — Encounter (HOSPITAL_COMMUNITY): Payer: Medicare Other

## 2020-09-04 ENCOUNTER — Ambulatory Visit (HOSPITAL_COMMUNITY): Payer: Medicare Other | Admitting: Physical Therapy

## 2020-09-06 ENCOUNTER — Ambulatory Visit (HOSPITAL_COMMUNITY): Payer: Medicare Other | Admitting: Physical Therapy

## 2020-09-10 DIAGNOSIS — F329 Major depressive disorder, single episode, unspecified: Secondary | ICD-10-CM | POA: Diagnosis not present

## 2020-09-11 ENCOUNTER — Ambulatory Visit (HOSPITAL_COMMUNITY): Payer: Medicare Other | Admitting: Physical Therapy

## 2020-09-11 ENCOUNTER — Encounter (HOSPITAL_COMMUNITY): Payer: Medicare Other | Admitting: Specialist

## 2020-09-12 DIAGNOSIS — M25572 Pain in left ankle and joints of left foot: Secondary | ICD-10-CM | POA: Diagnosis not present

## 2020-09-13 ENCOUNTER — Encounter: Payer: Self-pay | Admitting: Physical Medicine & Rehabilitation

## 2020-09-13 ENCOUNTER — Encounter: Payer: Medicare Other | Attending: Registered Nurse | Admitting: Physical Medicine & Rehabilitation

## 2020-09-13 ENCOUNTER — Other Ambulatory Visit: Payer: Self-pay

## 2020-09-13 VITALS — BP 159/87 | HR 70 | Temp 98.4°F | Ht 66.0 in | Wt 204.0 lb

## 2020-09-13 DIAGNOSIS — I69319 Unspecified symptoms and signs involving cognitive functions following cerebral infarction: Secondary | ICD-10-CM | POA: Insufficient documentation

## 2020-09-13 DIAGNOSIS — R269 Unspecified abnormalities of gait and mobility: Secondary | ICD-10-CM | POA: Diagnosis not present

## 2020-09-13 DIAGNOSIS — I69398 Other sequelae of cerebral infarction: Secondary | ICD-10-CM | POA: Diagnosis not present

## 2020-09-13 DIAGNOSIS — I69354 Hemiplegia and hemiparesis following cerebral infarction affecting left non-dominant side: Secondary | ICD-10-CM | POA: Insufficient documentation

## 2020-09-13 DIAGNOSIS — R208 Other disturbances of skin sensation: Secondary | ICD-10-CM | POA: Insufficient documentation

## 2020-09-13 DIAGNOSIS — R209 Unspecified disturbances of skin sensation: Secondary | ICD-10-CM | POA: Insufficient documentation

## 2020-09-13 MED ORDER — GABAPENTIN 100 MG PO CAPS: 100.0000 mg | ORAL_CAPSULE | Freq: Three times a day (TID) | ORAL | 2 refills | Status: DC

## 2020-09-13 NOTE — Progress Notes (Signed)
Subjective:    Patient ID: Cheryl Ortiz, female    DOB: 09-Apr-1951, 70 y.o.   MRN: [id number] Patient states no falls but balance is off HPI Right-handed female with history of diabetes mellitus peripheral neuropathy, hypertension hyperlipidemia kidney stones with stent placement 2017, she quit smoking 22 years ago as well as history of aneurysm versus infundibulum 2 mm of the right MCA M1 1 year ago maintained on low-dose aspirin presents for follow up for right frontal lobe ICH as well as history of aneurysm versus infundibulum 2 mm of the right MCA M1 1 year ago maintained on aspirin held due to [place].  Last clinic visit on 07/27/20 with NP, notes reviewed.  Daughter supplements history. Since that time, pt states she followed up with Neuro, with plans for repeat CT. She completed therapies and is now going to the gym. CBGs have ben controlled. BP has been elevated. Left leg pain persists with heaviness. She stopped taking Ritalin. Denies falls. Using cane for ambulation. She notes some cognitive slowing post-stroke.   Pain Inventory Average Pain 6 Pain Right Now 6 My pain is sharp, stabbing, tingling and aching  In the last 24 hours, has pain interfered with the following? General activity 5 Relation with others 0 Enjoyment of life 0 What TIME of day is your pain at its worst? daytime Sleep (in general) Fair  Pain is worse with: walking and standing Pain improves with: heat/ice, therapy/exercise and medication Relief from Meds: 5  Family History  Problem Relation Age of Onset  . Hypertension Mother   . Arthritis Mother   . Cancer Mother        breast   . Cerebral palsy Brother   . Early death Brother   . Hypertension Brother   . Heart disease Brother   . Pneumonia Father   . Hypertension Father   . Hyperlipidemia Sister   . Breast cancer Sister   . Fibroids Sister   . Hypertension Brother   . Heart attack Brother    Social History   Socioeconomic History  .  Marital status: Widowed    Spouse name: Not on file  . Number of children: Not on file  . Years of education: Not on file  . Highest education level: Not on file  Occupational History  . Not on file  Tobacco Use  . Smoking status: Former Smoker    Packs/day: 0.50    Years: 12.00    Pack years: 6.00    Types: Cigarettes    Quit date: 08/15/1997    Years since quitting: 23.0  . Smokeless tobacco: Never Used  Vaping Use  . Vaping Use: Never used  Substance and Sexual Activity  . Alcohol use: Not Currently  . Drug use: No  . Sexual activity: Not Currently    Birth control/protection: Surgical    Comment: hyst  Other Topics Concern  . Not on file  Social History Narrative  . Not on file   Social Determinants of Health   Financial Resource Strain: Not on file  Food Insecurity: Not on file  Transportation Needs: Not on file  Physical Activity: Not on file  Stress: Not on file  Social Connections: Not on file   Past Surgical History:  Procedure Laterality Date  . CHOLECYSTECTOMY  1999  . COLONOSCOPY N/A 06/15/2014   Procedure: COLONOSCOPY;  Surgeon: Rogene Houston, MD;  [facility];  Service: Endoscopy;  Laterality: N/A;  1145  . CYSTOSCOPY WITH RETROGRADE  PYELOGRAM, URETEROSCOPY AND STENT PLACEMENT Bilateral 08/20/2015   Procedure: CYSTOSCOPY WITH RETROGRADE PYELOGRAM, URETEROSCOPY AND STENT PLACEMENT WITH STONE EXTRACTION WITH BASKET;  Surgeon: Cleon Gustin, MD;  Location: [facility];  Service: Urology;  Laterality: Bilateral;  . ESOPHAGEAL DILATION N/A 03/23/2015   Procedure: ESOPHAGEAL DILATION;  Surgeon: Rogene Houston, MD;  [facility];  Service: Endoscopy;  Laterality: N/A;  . ESOPHAGOGASTRODUODENOSCOPY N/A 03/23/2015   Procedure: ESOPHAGOGASTRODUODENOSCOPY (EGD);  Surgeon: Rogene Houston, MD;  [facility];  Service: Endoscopy;  Laterality: N/A;  8:55 - moved to 12:35 - Ann to notify pt  . FOOT SURGERY Bilateral    . HOLMIUM LASER APPLICATION Bilateral 11/16/2456   Procedure: HOLMIUM LASER APPLICATION;  Surgeon: Cleon Gustin, MD;  Location: [facility];  Service: Urology;  Laterality: Bilateral;  . VAGINAL HYSTERECTOMY  1984   Past Surgical History:  Procedure Laterality Date  . CHOLECYSTECTOMY  1999  . COLONOSCOPY N/A 06/15/2014   Procedure: COLONOSCOPY;  Surgeon: Rogene Houston, MD;  [facility];  Service: Endoscopy;  Laterality: N/A;  1145  . CYSTOSCOPY WITH RETROGRADE PYELOGRAM, URETEROSCOPY AND STENT PLACEMENT Bilateral 08/20/2015   Procedure: CYSTOSCOPY WITH RETROGRADE PYELOGRAM, URETEROSCOPY AND STENT PLACEMENT WITH STONE EXTRACTION WITH BASKET;  Surgeon: Cleon Gustin, MD;  Location: [facility];  Service: Urology;  Laterality: Bilateral;  . ESOPHAGEAL DILATION N/A 03/23/2015   Procedure: ESOPHAGEAL DILATION;  Surgeon: Rogene Houston, MD;  [facility];  Service: Endoscopy;  Laterality: N/A;  . ESOPHAGOGASTRODUODENOSCOPY N/A 03/23/2015   Procedure: ESOPHAGOGASTRODUODENOSCOPY (EGD);  Surgeon: Rogene Houston, MD;  [facility];  Service: Endoscopy;  Laterality: N/A;  8:55 - moved to 12:35 - Ann to notify pt  . FOOT SURGERY Bilateral   . HOLMIUM LASER APPLICATION Bilateral 0/01/9832   Procedure: HOLMIUM LASER APPLICATION;  Surgeon: Cleon Gustin, MD;  Location: [facility];  Service: Urology;  Laterality: Bilateral;  . VAGINAL HYSTERECTOMY  1984   Past Medical History:  Diagnosis Date  . Aneurysm ([place]) 06/2015   brain-small will recheck in 1 year  . Cold   . Cystocele 07/12/2015  . Gout   . Hyperlipidemia   . Hypertension   . Neuropathy of both feet    tingling/numbness bilateral feet ,rt arm,hands-nerve study scheduled  . Osteoarthritis   . Renal disorder    kidney stones  . Seasonal allergies   . Stroke ([place])   . Type 2 diabetes mellitus ([facility])    BP (!) 159/87   Pulse 70   Temp 98.4 F (36.9  C)   Ht 5\' 6"  (1.676 m)   Wt 204 lb (92.5 kg)   SpO2 98%   BMI 32.93 kg/m   Opioid Risk Score:   Fall Risk Score:  `1  Depression screen PHQ 2/9  Depression screen [facility] 2/9 07/27/2020 12/09/2018 09/09/2017  Decreased Interest 3 0 0  Down, Depressed, Hopeless 1 0 0  PHQ - 2 Score 4 0 0  Altered sleeping 0 - 0  Tired, decreased energy 1 - 2  Change in appetite 0 - 0  Feeling bad or failure about yourself  3 - 0  Trouble concentrating 3 - 0  Moving slowly or fidgety/restless 1 - 0  Suicidal thoughts 0 - 0  PHQ-9 Score 12 - 2  Difficult doing work/chores - - Somewhat difficult    Review of Systems  Gastrointestinal: Positive for abdominal pain.  Musculoskeletal: Positive for gait problem.       Left shoulder, knee, and ankle pain  Neurological: Positive for tremors and weakness.  Psychiatric/Behavioral: Positive for confusion and dysphoric mood. The patient is nervous/anxious.   All other systems reviewed and are negative.      Objective:   Physical Exam  Constitutional: No distress . Vital signs reviewed. HENT: Normocephalic.  Atraumatic. Eyes: EOMI. No discharge. Cardiovascular: No JVD.   Respiratory: Normal effort.  No stridor.   GI: Non-distended.   Skin: Warm and dry.  Intact. Psych: Normal mood.  Normal behavior. Musc: No edema in extremities.  No tenderness in extremities. Neuro: Alert and oriented  Motor: Left upper extremity: 4+/5 proximal distal Left lower extremity: Hip flexion 4/5, knee extension 4+/5, ankle dorsiflexion 4+/5    Assessment & Plan:  Right-handed female with history of diabetes mellitus peripheral neuropathy, hypertension hyperlipidemia kidney stones with stent placement 2017, she quit smoking 22 years ago as well as history of aneurysm versus infundibulum 2 mm of the right MCA M1 1 year ago maintained on low-dose aspirin presents for follow up for right frontal lobe ICH as well as history of aneurysm versus infundibulum 2 mm of the right MCA  M1 1 year ago maintained on aspirin held due to [place].  1. 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  Continue activity at gym  Cont follow up with Neuro - CT planned  Cleared by Neuro to drive  Will refer for outpatient therapies  2. Pain Management: - Mainly "heavy"  Will trial Gabapentin 100 TID  Cont OTC meds  3.  Diabetes mellitus type II with hyperglycemia.    Cont meds  Controlled per patient  4.  Hypertension.   Cont meds  Has been elevated per patient, encouraged follow up with PCP  5. Gait abnormality  Cont gym  Therapies ordered  Cont cane for safety

## 2020-09-14 ENCOUNTER — Ambulatory Visit (HOSPITAL_COMMUNITY): Payer: Medicare Other | Admitting: Physical Therapy

## 2020-09-14 DIAGNOSIS — I10 Essential (primary) hypertension: Secondary | ICD-10-CM | POA: Diagnosis not present

## 2020-09-14 DIAGNOSIS — Z6833 Body mass index (BMI) 33.0-33.9, adult: Secondary | ICD-10-CM | POA: Diagnosis not present

## 2020-09-14 DIAGNOSIS — E6609 Other obesity due to excess calories: Secondary | ICD-10-CM | POA: Diagnosis not present

## 2020-09-18 ENCOUNTER — Encounter: Payer: Self-pay | Admitting: Obstetrics & Gynecology

## 2020-09-19 ENCOUNTER — Telehealth: Payer: Self-pay | Admitting: Neurology

## 2020-09-19 NOTE — Telephone Encounter (Signed)
Patient left a voicemail on my phone stating she is suppose to get a MRI. I don't see an MRI that has been order.

## 2020-09-20 NOTE — Telephone Encounter (Signed)
Noted, I spoke with the patient and informed her that Dr. Jannifer Franklin has a reminder to put order in on June 1.

## 2020-09-26 ENCOUNTER — Encounter (HOSPITAL_COMMUNITY): Payer: Self-pay | Admitting: Physical Therapy

## 2020-09-26 ENCOUNTER — Other Ambulatory Visit: Payer: Self-pay

## 2020-09-26 ENCOUNTER — Ambulatory Visit (HOSPITAL_COMMUNITY): Payer: Medicare Other | Attending: Physical Medicine & Rehabilitation | Admitting: Physical Therapy

## 2020-09-26 DIAGNOSIS — M6281 Muscle weakness (generalized): Secondary | ICD-10-CM | POA: Diagnosis not present

## 2020-09-26 DIAGNOSIS — R262 Difficulty in walking, not elsewhere classified: Secondary | ICD-10-CM

## 2020-09-26 DIAGNOSIS — R2689 Other abnormalities of gait and mobility: Secondary | ICD-10-CM | POA: Diagnosis not present

## 2020-09-26 DIAGNOSIS — R29898 Other symptoms and signs involving the musculoskeletal system: Secondary | ICD-10-CM | POA: Insufficient documentation

## 2020-09-26 NOTE — Therapy (Signed)
[facility] [address] [place], [place], [address] Phone: [phone]   Fax:  [phone]  Physical Therapy Evaluation  Patient Details  Name: Cheryl Ortiz MRN: [id number] Date of Birth: 03/28/1951 Referring Provider (PT): Delice Lesch   Encounter Date: 09/26/2020   PT End of Session - 09/26/20 1452    Visit Number 1    Number of Visits 8    Date for PT Re-Evaluation 10/24/20    Authorization Type medicare para a and CHAM[employer] no auth or VL    Progress Note Due on Visit 10    PT Start Time 7169    PT Stop Time 1515    PT Time Calculation (min) 30 min    Equipment Utilized During Treatment Gait belt    Activity Tolerance Patient tolerated treatment well    Behavior During Therapy [facility] for tasks assessed/performed           Past Medical History:  Diagnosis Date  . Aneurysm ([place]) 06/2015   brain-small will recheck in 1 year  . Cold   . Cystocele 07/12/2015  . Gout   . Hyperlipidemia   . Hypertension   . Neuropathy of both feet    tingling/numbness bilateral feet ,rt arm,hands-nerve study scheduled  . Osteoarthritis   . Renal disorder    kidney stones  . Seasonal allergies   . Stroke ([place])   . Type 2 diabetes mellitus ([place])     Past Surgical History:  Procedure Laterality Date  . CHOLECYSTECTOMY  1999  . COLONOSCOPY N/A 06/15/2014   Procedure: COLONOSCOPY;  Surgeon: Rogene Houston, MD;  [facility];  Service: Endoscopy;  Laterality: N/A;  1145  . CYSTOSCOPY WITH RETROGRADE PYELOGRAM, URETEROSCOPY AND STENT PLACEMENT Bilateral 08/20/2015   Procedure: CYSTOSCOPY WITH RETROGRADE PYELOGRAM, URETEROSCOPY AND STENT PLACEMENT WITH STONE EXTRACTION WITH BASKET;  Surgeon: Cleon Gustin, MD;  Location: [facility];  Service: Urology;  Laterality: Bilateral;  . ESOPHAGEAL DILATION N/A 03/23/2015   Procedure: ESOPHAGEAL DILATION;  Surgeon: Rogene Houston, MD;  [facility];  Service:  Endoscopy;  Laterality: N/A;  . ESOPHAGOGASTRODUODENOSCOPY N/A 03/23/2015   Procedure: ESOPHAGOGASTRODUODENOSCOPY (EGD);  Surgeon: Rogene Houston, MD;  [facility];  Service: Endoscopy;  Laterality: N/A;  8:55 - moved to 12:35 - Ann to notify pt  . FOOT SURGERY Bilateral   . HOLMIUM LASER APPLICATION Bilateral 10/23/8936   Procedure: HOLMIUM LASER APPLICATION;  Surgeon: Cleon Gustin, MD;  Location: [facility];  Service: Urology;  Laterality: Bilateral;  . VAGINAL HYSTERECTOMY  1984    There were no vitals filed for this visit.    Subjective Assessment - 09/26/20 1450    Subjective States that a couple of weeks ago her legs are wobbling a lot. States when she bends over the top part of her body wants to keep going and she has to catch herself. States that she has problem with occasionally having difficulties with some instances of wobbling. States that anytime she has to turn her head will turn but her brain is not going with her.    Patient Stated Goals to Drive, go shopping, housework, care taker for her bother.    Currently in Pain? No/denies              [facility] PT Assessment - 09/26/20 0001      Assessment   Medical Diagnosis balance    Referring Provider (PT) Delice Lesch  Onset Date/Surgical Date 06/22/20    Prior Therapy yes here for this CVA      Balance Screen   Has the patient fallen in the past 6 months No      [place] residence      Prior Function   Level of Independence Independent      Cognition   Overall Cognitive Status Within Functional Limits for tasks assessed      Balance   Balance Assessed Yes      Static Standing Balance   Static Standing - Balance Support No upper extremity supported    Static Standing - Level of Assistance 5: Stand by assistance    Static Standing Balance -  Activities  Single Leg Stance - Right Leg;Single Leg Stance - Left Leg    Static Standing - Comment/#  of Minutes L SLS 13 seconds with mild sway and R SLS was 2 seconds      Standardized Balance Assessment   Standardized Balance Assessment Dynamic Gait Index      Dynamic Gait Index   Level Surface Normal    Change in Gait Speed Mild Impairment    Gait with Horizontal Head Turns Mild Impairment    Gait with Vertical Head Turns Mild Impairment    Gait and Pivot Turn Mild Impairment    Step Over Obstacle Mild Impairment    Step Around Obstacles Mild Impairment    Steps Mild Impairment    Total Score 17                      Objective measurements completed on examination: See above findings.       [facility] Adult PT Treatment/Exercise - 09/26/20 0001      Balance   Balance Assessed Yes      High Level Balance   High Level Balance Comments SLS with 2 finger support x2 15 seconds bilaterally                  PT Education - 09/26/20 1517    Education Details on current condition, on POC and HEP, about challenging balance safely and improving overall balance    Person(s) Educated Patient    Methods Explanation    Comprehension Verbalized understanding            PT Short Term Goals - 09/26/20 1500      PT SHORT TERM GOAL #1   Title Patient will be independent in self management strategies to improve quality of life and functional outcomes    Time 2    Period Weeks    Status New    Target Date 10/10/20      PT SHORT TERM GOAL #2   Title Patient will be able to stand on either leg for 10 seconds without upper extremity support to demonstrate improved static balance.    Time 2    Period Weeks    Status New    Target Date 10/10/20      PT SHORT TERM GOAL #3   Title Patient will report at least 25% improvement in overall symptoms and/or function to demonstrate improved functional mobility    Time 2    Period Weeks    Status New    Target Date 10/10/20             PT Long Term Goals - 09/26/20 1501      PT LONG TERM GOAL #1   Title Patient  will report at least 50% improvement in overall symptoms and/or function to demonstrate improved functional mobility    Time 4    Period Weeks    Status New    Target Date 10/24/20      PT LONG TERM GOAL #2   Title Patient will be able to demonstrate at least 20/24 on DGI to demonstrate improved dynamic balance    Time 4    Period Weeks    Status New    Target Date 10/24/20      PT LONG TERM GOAL #3   Title Patient will report being able to walk downtown by herself to demonstrate improved confidence in walking and dynamic balance.    Time 4    Period Weeks    Status New    Target Date 10/24/20      PT LONG TERM GOAL #4   Title Patient will be able to stand on either leg for 15 seconds without upper extremity support to demonstrate improved static balance.    Time 4    Period Weeks    Status New    Target Date 10/24/20                  Plan - 09/26/20 1516    Clinical Impression Statement Patient is a 70 y.o. female who presents to physical therapy with complaint of balance issues especially when turning around. She was previously treated here for her recent CVA in February of this year but feels like her strength is fine, but her balance is no good. Patient demonstrates balance deficits and gait abnormalities which are negatively impacting patient ability to perform ADLs and functional mobility tasks. Patient will benefit from skilled physical therapy services to address these deficits to improve level of function with ADLs, functional mobility tasks, and reduce risk for falls.    Personal Factors and Comorbidities Fitness    Examination-Activity Limitations Caring for Others;Carry;Locomotion Level;Dressing;Stairs    Examination-Participation Restrictions Church;Community Activity;Driving;Laundry;Meal Prep    Stability/Clinical Decision Making Stable/Uncomplicated    Clinical Decision Making Low    Rehab Potential Good    PT Frequency 2x / week    PT Duration 4 weeks     PT Treatment/Interventions ADLs/Self Care Home Management;Gait training;DME Instruction;Stair training;Functional mobility training;Therapeutic activities;Therapeutic exercise;Balance training;Neuromuscular re-education;Patient/family education;Manual techniques;Aquatic Therapy    PT Next Visit Plan focus on dynamic balance but also add in static balance    PT Home Exercise Plan SLS with UE support - only as much as needed    Consulted and Agree with Plan of Care Patient           Patient will benefit from skilled therapeutic intervention in order to improve the following deficits and impairments:  Abnormal gait,Decreased activity tolerance,Decreased balance,Difficulty walking,Decreased strength,Pain  Visit Diagnosis: Balance problem  Difficulty in walking, not elsewhere classified     Problem List Patient Active Problem List   Diagnosis Date Noted  . Hemiparesis affecting left side as late effect of stroke ([place]) 09/13/2020  . Alteration of sensation as late effect of stroke 09/13/2020  . Dysesthesia 09/13/2020  . Abnormality of gait 09/13/2020  . Cognitive deficit, post-stroke 09/13/2020  . Pressure injury of skin 07/18/2020  . Labile blood pressure   . Left foot pain   . Attention and concentration deficit   . History of hypertension   . Hypotension due to drugs   . Labile blood glucose   . Hypokalemia   . Essential hypertension   .  Controlled type 2 diabetes mellitus with hyperglycemia, without long-term current use of insulin ([place])   . ICH (intracerebral hemorrhage) ([place]) 06/23/2020  . Pelvic floor relaxation 03/07/2020  . Screening for colorectal cancer 12/09/2018  . 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 Snoring 06/08/2018  . Poor sleep hygiene 06/08/2018  . Chronic insomnia 06/08/2018  . Encounter for well woman exam with routine gynecological exam 09/09/2017  . Female cystocele 09/09/2017  . Cystocele 07/12/2015  . Precordial pain  02/18/2013  . Type 2 diabetes mellitus ([place]) 02/18/2013  . Hyperlipidemia 02/17/2013  . Essential hypertension, benign 02/17/2013    3:20 PM, 09/26/20 Jerene Pitch, DPT Physical Therapy with [facility]  [phone] office  [facility] [address] [place], [place], [address] Phone: [phone]   Fax:  [phone]  Name: Cheryl Ortiz MRN: [id number] Date of Birth: 08-29-1950

## 2020-09-28 DIAGNOSIS — E559 Vitamin D deficiency, unspecified: Secondary | ICD-10-CM | POA: Diagnosis not present

## 2020-09-28 DIAGNOSIS — E119 Type 2 diabetes mellitus without complications: Secondary | ICD-10-CM | POA: Diagnosis not present

## 2020-09-28 DIAGNOSIS — K529 Noninfective gastroenteritis and colitis, unspecified: Secondary | ICD-10-CM | POA: Diagnosis not present

## 2020-09-28 DIAGNOSIS — E6609 Other obesity due to excess calories: Secondary | ICD-10-CM | POA: Diagnosis not present

## 2020-09-28 DIAGNOSIS — Z6831 Body mass index (BMI) 31.0-31.9, adult: Secondary | ICD-10-CM | POA: Diagnosis not present

## 2020-10-01 ENCOUNTER — Ambulatory Visit (HOSPITAL_COMMUNITY): Payer: Medicare Other | Admitting: Physical Therapy

## 2020-10-02 ENCOUNTER — Other Ambulatory Visit: Payer: Self-pay

## 2020-10-02 ENCOUNTER — Encounter (INDEPENDENT_AMBULATORY_CARE_PROVIDER_SITE_OTHER): Payer: Self-pay | Admitting: *Deleted

## 2020-10-02 ENCOUNTER — Ambulatory Visit (HOSPITAL_COMMUNITY): Payer: Medicare Other | Admitting: Physical Therapy

## 2020-10-02 ENCOUNTER — Encounter (HOSPITAL_COMMUNITY): Payer: Self-pay | Admitting: Physical Therapy

## 2020-10-02 DIAGNOSIS — M6281 Muscle weakness (generalized): Secondary | ICD-10-CM | POA: Diagnosis not present

## 2020-10-02 DIAGNOSIS — R2689 Other abnormalities of gait and mobility: Secondary | ICD-10-CM | POA: Diagnosis not present

## 2020-10-02 DIAGNOSIS — R262 Difficulty in walking, not elsewhere classified: Secondary | ICD-10-CM | POA: Diagnosis not present

## 2020-10-02 DIAGNOSIS — R29898 Other symptoms and signs involving the musculoskeletal system: Secondary | ICD-10-CM | POA: Diagnosis not present

## 2020-10-02 NOTE — Therapy (Signed)
[facility] [address] [place], [place], [address] Phone: [phone]   Fax:  [phone]  Physical Therapy Treatment  Patient Details  Name: Cheryl Ortiz MRN: [id number] Date of Birth: August 01, 1950 Referring Provider (PT): Delice Lesch   Encounter Date: 10/02/2020   PT End of Session - 10/02/20 0832    Visit Number 2    Number of Visits 8    Date for PT Re-Evaluation 10/24/20    Authorization Type medicare para a and CHAM[employer]VA no auth or VL    Progress Note Due on Visit 10    PT Start Time 0832    PT Stop Time 0910    PT Time Calculation (min) 38 min    Equipment Utilized During Treatment Gait belt    Activity Tolerance Patient tolerated treatment well    Behavior During Therapy [facility] for tasks assessed/performed           Past Medical History:  Diagnosis Date  . Aneurysm ([place]) 06/2015   brain-small will recheck in 1 year  . Cold   . Cystocele 07/12/2015  . Gout   . Hyperlipidemia   . Hypertension   . Neuropathy of both feet    tingling/numbness bilateral feet ,rt arm,hands-nerve study scheduled  . Osteoarthritis   . Renal disorder    kidney stones  . Seasonal allergies   . Stroke ([place])   . Type 2 diabetes mellitus ([place])     Past Surgical History:  Procedure Laterality Date  . CHOLECYSTECTOMY  1999  . COLONOSCOPY N/A 06/15/2014   Procedure: COLONOSCOPY;  Surgeon: Rogene Houston, MD;  [facility];  Service: Endoscopy;  Laterality: N/A;  1145  . CYSTOSCOPY WITH RETROGRADE PYELOGRAM, URETEROSCOPY AND STENT PLACEMENT Bilateral 08/20/2015   Procedure: CYSTOSCOPY WITH RETROGRADE PYELOGRAM, URETEROSCOPY AND STENT PLACEMENT WITH STONE EXTRACTION WITH BASKET;  Surgeon: Cleon Gustin, MD;  Location: [facility];  Service: Urology;  Laterality: Bilateral;  . ESOPHAGEAL DILATION N/A 03/23/2015   Procedure: ESOPHAGEAL DILATION;  Surgeon: Rogene Houston, MD;  [facility];  Service:  Endoscopy;  Laterality: N/A;  . ESOPHAGOGASTRODUODENOSCOPY N/A 03/23/2015   Procedure: ESOPHAGOGASTRODUODENOSCOPY (EGD);  Surgeon: Rogene Houston, MD;  [facility];  Service: Endoscopy;  Laterality: N/A;  8:55 - moved to 12:35 - Ann to notify pt  . FOOT SURGERY Bilateral   . HOLMIUM LASER APPLICATION Bilateral 05/24/1759   Procedure: HOLMIUM LASER APPLICATION;  Surgeon: Cleon Gustin, MD;  Location[facility];  Service: Urology;  Laterality: Bilateral;  . VAGINAL HYSTERECTOMY  1984    There were no vitals filed for this visit.   Subjective Assessment - 10/02/20 0838    Subjective States that her wobbliness is a little bit better but thinks it depends on the shoes. States her exercises are going well.    Patient Stated Goals to Drive, go shopping, housework, care taker for her bother.    Currently in Pain? No/denies              [facility] PT Assessment - 10/02/20 0001      Assessment   Medical Diagnosis balance    Referring Provider (PT) Delice Lesch                         [facility] Adult PT Treatment/Exercise - 10/02/20 0001      Knee/Hip Exercises: Standing   Other Standing Knee Exercises lateral stepping  at counter side 3x10 bilateral - looking straight ahead; tandem x 4 bilateral 15 second holds, then one set of tandem for 30 seconds bilateral    Other Standing Knee Exercises SLS with one finger support x3 30" holds Bilateral      Knee/Hip Exercises: Seated   Sit to Sand 3 sets;without UE support;10 reps   with weighted yellow ball                   PT Short Term Goals - 09/26/20 1500      PT SHORT TERM GOAL #1   Title Patient will be independent in self management strategies to improve quality of life and functional outcomes    Time 2    Period Weeks    Status New    Target Date 10/10/20      PT SHORT TERM GOAL #2   Title Patient will be able to stand on either leg for 10 seconds without upper extremity support to  demonstrate improved static balance.    Time 2    Period Weeks    Status New    Target Date 10/10/20      PT SHORT TERM GOAL #3   Title Patient will report at least 25% improvement in overall symptoms and/or function to demonstrate improved functional mobility    Time 2    Period Weeks    Status New    Target Date 10/10/20             PT Long Term Goals - 09/26/20 1501      PT LONG TERM GOAL #1   Title Patient will report at least 50% improvement in overall symptoms and/or function to demonstrate improved functional mobility    Time 4    Period Weeks    Status New    Target Date 10/24/20      PT LONG TERM GOAL #2   Title Patient will be able to demonstrate at least 20/24 on DGI to demonstrate improved dynamic balance    Time 4    Period Weeks    Status New    Target Date 10/24/20      PT LONG TERM GOAL #3   Title Patient will report being able to walk downtown by herself to demonstrate improved confidence in walking and dynamic balance.    Time 4    Period Weeks    Status New    Target Date 10/24/20      PT LONG TERM GOAL #4   Title Patient will be able to stand on either leg for 15 seconds without upper extremity support to demonstrate improved static balance.    Time 4    Period Weeks    Status New    Target Date 10/24/20                 Plan - 10/02/20 0907    Clinical Impression Statement Continued to progress patient as tolerated. Able to add side stepping to HEP and STS. Cued patient to breath throughout session as she tends to hold her breath.  Fatigue noted with new exercises but no loss of balances. Improved static  balance noted during session. Will continue with current POC.    Personal Factors and Comorbidities Fitness    Examination-Activity Limitations Caring for Others;Carry;Locomotion Level;Dressing;Stairs    Examination-Participation Restrictions Church;Community Activity;Driving;Laundry;Meal Prep    Stability/Clinical Decision Making  Stable/Uncomplicated    Rehab Potential Good    PT Frequency 2x / week  PT Duration 4 weeks    PT Treatment/Interventions ADLs/Self Care Home Management;Gait training;DME Instruction;Stair training;Functional mobility training;Therapeutic activities;Therapeutic exercise;Balance training;Neuromuscular re-education;Patient/family education;Manual techniques;Aquatic Therapy    PT Next Visit Plan focus on dynamic balance but also add in static balance    PT Home Exercise Plan SLS with UE support - only as much as needed; 5/17 tandem, STS, lateral stepping    Consulted and Agree with Plan of Care Patient           Patient will benefit from skilled therapeutic intervention in order to improve the following deficits and impairments:  Abnormal gait,Decreased activity tolerance,Decreased balance,Difficulty walking,Decreased strength,Pain  Visit Diagnosis: Balance problem  Difficulty in walking, not elsewhere classified  Muscle weakness (generalized)     Problem List Patient Active Problem List   Diagnosis Date Noted  . Hemiparesis affecting left side as late effect of stroke ([place]) 09/13/2020  . Alteration of sensation as late effect of stroke 09/13/2020  . Dysesthesia 09/13/2020  . Abnormality of gait 09/13/2020  . Cognitive deficit, post-stroke 09/13/2020  . Pressure injury of skin 07/18/2020  . Labile blood pressure   . Left foot pain   . Attention and concentration deficit   . History of hypertension   . Hypotension due to drugs   . Labile blood glucose   . Hypokalemia   . Essential hypertension   . Controlled type 2 diabetes mellitus with hyperglycemia, without long-term current use of insulin ([place])   . ICH (intracerebral hemorrhage) ([place]) 06/23/2020  . Pelvic floor relaxation 03/07/2020  . Screening for colorectal cancer 12/09/2018  . 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 Snoring 06/08/2018  . Poor sleep hygiene 06/08/2018  . Chronic insomnia  06/08/2018  . Encounter for well woman exam with routine gynecological exam 09/09/2017  . Female cystocele 09/09/2017  . Cystocele 07/12/2015  . Precordial pain 02/18/2013  . Type 2 diabetes mellitus ([place]) 02/18/2013  . Hyperlipidemia 02/17/2013  . Essential hypertension, benign 02/17/2013    9:09 AM, 10/02/20 Jerene Pitch, DPT Physical Therapy with [facility]  [phone] office  [facility] [address] [place], [place], [address] Phone: [phone]   Fax:  [phone]  Name: Cheryl Ortiz MRN: [id number] Date of Birth: December 10, 1950

## 2020-10-03 ENCOUNTER — Ambulatory Visit (HOSPITAL_COMMUNITY): Payer: Medicare Other | Admitting: Physical Therapy

## 2020-10-03 ENCOUNTER — Encounter (HOSPITAL_COMMUNITY): Payer: Self-pay | Admitting: Physical Therapy

## 2020-10-03 ENCOUNTER — Encounter (HOSPITAL_COMMUNITY): Payer: Medicare Other

## 2020-10-03 DIAGNOSIS — R29898 Other symptoms and signs involving the musculoskeletal system: Secondary | ICD-10-CM | POA: Diagnosis not present

## 2020-10-03 DIAGNOSIS — R262 Difficulty in walking, not elsewhere classified: Secondary | ICD-10-CM

## 2020-10-03 DIAGNOSIS — M6281 Muscle weakness (generalized): Secondary | ICD-10-CM

## 2020-10-03 DIAGNOSIS — R2689 Other abnormalities of gait and mobility: Secondary | ICD-10-CM | POA: Diagnosis not present

## 2020-10-03 NOTE — Therapy (Signed)
[facility] [address] [place], [place], [address] Phone: [phone]   Fax:  [phone]  Physical Therapy Treatment  Patient Details  Name: Cheryl Ortiz MRN: [id number] Date of Birth: Sep 04, 1950 Referring Provider (PT): Delice Lesch   Encounter Date: 10/03/2020   PT End of Session - 10/03/20 0917    Visit Number 3    Number of Visits 8    Date for PT Re-Evaluation 10/24/20    Authorization Type medicare para a and CHAMPVVA no auth or VL    Progress Note Due on Visit 10    PT Start Time 0917    PT Stop Time 0955    PT Time Calculation (min) 38 min    Equipment Utilized During Treatment Gait belt    Activity Tolerance Patient tolerated treatment well    Behavior During Therapy [facility] for tasks assessed/performed           Past Medical History:  Diagnosis Date  . Aneurysm ([place]) 06/2015   brain-small will recheck in 1 year  . Cold   . Cystocele 07/12/2015  . Gout   . Hyperlipidemia   . Hypertension   . Neuropathy of both feet    tingling/numbness bilateral feet ,rt arm,hands-nerve study scheduled  . Osteoarthritis   . Renal disorder    kidney stones  . Seasonal allergies   . Stroke ([place])   . Type 2 diabetes mellitus ([place])     Past Surgical History:  Procedure Laterality Date  . CHOLECYSTECTOMY  1999  . COLONOSCOPY N/A 06/15/2014   Procedure: COLONOSCOPY;  Surgeon: Rogene Houston, MD;  [facility];  Service: Endoscopy;  Laterality: N/A;  1145  . CYSTOSCOPY WITH RETROGRADE PYELOGRAM, URETEROSCOPY AND STENT PLACEMENT Bilateral 08/20/2015   Procedure: CYSTOSCOPY WITH RETROGRADE PYELOGRAM, URETEROSCOPY AND STENT PLACEMENT WITH STONE EXTRACTION WITH BASKET;  Surgeon: Cleon Gustin, MD;  Location: [facility];  Service: Urology;  Laterality: Bilateral;  . ESOPHAGEAL DILATION N/A 03/23/2015   Procedure: ESOPHAGEAL DILATION;  Surgeon: Rogene Houston, MD;  [facility];  Service:  Endoscopy;  Laterality: N/A;  . ESOPHAGOGASTRODUODENOSCOPY N/A 03/23/2015   Procedure: ESOPHAGOGASTRODUODENOSCOPY (EGD);  Surgeon: Rogene Houston, MD;  [facility];  Service: Endoscopy;  Laterality: N/A;  8:55 - moved to 12:35 - Ann to notify pt  . FOOT SURGERY Bilateral   . HOLMIUM LASER APPLICATION Bilateral 3/0/1601   Procedure: HOLMIUM LASER APPLICATION;  Surgeon: Cleon Gustin, MD;  Location: [facility];  Service: Urology;  Laterality: Bilateral;  . VAGINAL HYSTERECTOMY  1984    There were no vitals filed for this visit.   Subjective Assessment - 10/03/20 0921    Subjective States she went to the [facility] yesterday and really worked out. States she feels she is getting better and did her exercises this morning. Reports she is not 100% but getting there. Reports no pain today.    Patient Stated Goals to Drive, go shopping, housework, care taker for her bother.    Currently in Pain? No/denies              [facility] PT Assessment - 10/03/20 0001      Assessment   Medical Diagnosis balance    Referring Provider (PT) Delice Lesch                         [facility] Adult PT Treatment/Exercise - 10/03/20 0001  Ambulation/Gait   Stairs Yes    Stairs Assistance 6: Modified independent (Device/Increase time)    Stair Management Technique One rail Right;Alternating pattern    Number of Stairs 4    Height of Stairs 7    Gait Comments x5 reps      Knee/Hip Exercises: Standing   Other Standing Knee Exercises tandem on floor x3 30" holds B - more challenging while having conversation; lateral stepping 4" inch step 2x15 bilateral; lateral stepping with 2# ankle weights 0 3x5 bilateral    Other Standing Knee Exercises staggered stance on 4" step with trunk rotations 4x5 bilateral; hamstring curl with 2# ankle weights 2x10 Bilateral with upper extremity assist      Knee/Hip Exercises: Seated   Long Arc Quad AROM;10 reps;2 sets;Both   2# ankle  weights   Sit to Sand 2 sets;10 reps;without UE support   holding yellow ball                   PT Short Term Goals - 09/26/20 1500      PT SHORT TERM GOAL #1   Title Patient will be independent in self management strategies to improve quality of life and functional outcomes    Time 2    Period Weeks    Status New    Target Date 10/10/20      PT SHORT TERM GOAL #2   Title Patient will be able to stand on either leg for 10 seconds without upper extremity support to demonstrate improved static balance.    Time 2    Period Weeks    Status New    Target Date 10/10/20      PT SHORT TERM GOAL #3   Title Patient will report at least 25% improvement in overall symptoms and/or function to demonstrate improved functional mobility    Time 2    Period Weeks    Status New    Target Date 10/10/20             PT Long Term Goals - 09/26/20 1501      PT LONG TERM GOAL #1   Title Patient will report at least 50% improvement in overall symptoms and/or function to demonstrate improved functional mobility    Time 4    Period Weeks    Status New    Target Date 10/24/20      PT LONG TERM GOAL #2   Title Patient will be able to demonstrate at least 20/24 on DGI to demonstrate improved dynamic balance    Time 4    Period Weeks    Status New    Target Date 10/24/20      PT LONG TERM GOAL #3   Title Patient will report being able to walk downtown by herself to demonstrate improved confidence in walking and dynamic balance.    Time 4    Period Weeks    Status New    Target Date 10/24/20      PT LONG TERM GOAL #4   Title Patient will be able to stand on either leg for 15 seconds without upper extremity support to demonstrate improved static balance.    Time 4    Period Weeks    Status New    Target Date 10/24/20                 Plan - 10/03/20 0953    Clinical Impression Statement Overall patient tolerated session well. Fatigue noted with increased respiratory  rate and with increased balance issues with each repetition. Able to add steps to program. Standing and resting breaks taken throughout session. Cues to keep feet straight and forward with side stepping as patient has tendency to externally rotate her hips with side stepping.    Personal Factors and Comorbidities Fitness    Examination-Activity Limitations Caring for Others;Carry;Locomotion Level;Dressing;Stairs    Examination-Participation Restrictions Church;Community Activity;Driving;Laundry;Meal Prep    Stability/Clinical Decision Making Stable/Uncomplicated    Rehab Potential Good    PT Frequency 2x / week    PT Duration 4 weeks    PT Treatment/Interventions ADLs/Self Care Home Management;Gait training;DME Instruction;Stair training;Functional mobility training;Therapeutic activities;Therapeutic exercise;Balance training;Neuromuscular re-education;Patient/family education;Manual techniques;Aquatic Therapy    PT Next Visit Plan focus on dynamic balance but also add in static balance    PT Home Exercise Plan SLS with UE support - only as much as needed; 5/17 tandem, STS, lateral stepping    Consulted and Agree with Plan of Care Patient           Patient will benefit from skilled therapeutic intervention in order to improve the following deficits and impairments:  Abnormal gait,Decreased activity tolerance,Decreased balance,Difficulty walking,Decreased strength,Pain  Visit Diagnosis: Balance problem  Difficulty in walking, not elsewhere classified  Muscle weakness (generalized)     Problem List Patient Active Problem List   Diagnosis Date Noted  . Hemiparesis affecting left side as late effect of stroke ([place]) 09/13/2020  . Alteration of sensation as late effect of stroke 09/13/2020  . Dysesthesia 09/13/2020  . Abnormality of gait 09/13/2020  . Cognitive deficit, post-stroke 09/13/2020  . Pressure injury of skin 07/18/2020  . Labile blood pressure   . Left foot pain   .  Attention and concentration deficit   . History of hypertension   . Hypotension due to drugs   . Labile blood glucose   . Hypokalemia   . Essential hypertension   . Controlled type 2 diabetes mellitus with hyperglycemia, without long-term current use of insulin ([place])   . ICH (intracerebral hemorrhage) ([place]) 06/23/2020  . Pelvic floor relaxation 03/07/2020  . Screening for colorectal cancer 12/09/2018  . 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 Snoring 06/08/2018  . Poor sleep hygiene 06/08/2018  . Chronic insomnia 06/08/2018  . Encounter for well woman exam with routine gynecological exam 09/09/2017  . Female cystocele 09/09/2017  . Cystocele 07/12/2015  . Precordial pain 02/18/2013  . Type 2 diabetes mellitus ([place]) 02/18/2013  . Hyperlipidemia 02/17/2013  . Essential hypertension, benign 02/17/2013   9:56 AM, 10/03/20 Jerene Pitch, DPT Physical Therapy with [facility]  [phone] office  [place] [address] [place], [place], [address] Phone: [phone]   Fax:  [phone]  Name: Cheryl Ortiz MRN: [id number] Date of Birth: 05-12-1951

## 2020-10-09 ENCOUNTER — Ambulatory Visit (HOSPITAL_COMMUNITY): Payer: Medicare Other

## 2020-10-09 ENCOUNTER — Other Ambulatory Visit: Payer: Self-pay

## 2020-10-09 ENCOUNTER — Encounter (HOSPITAL_COMMUNITY): Payer: Self-pay

## 2020-10-09 DIAGNOSIS — R2689 Other abnormalities of gait and mobility: Secondary | ICD-10-CM

## 2020-10-09 DIAGNOSIS — R262 Difficulty in walking, not elsewhere classified: Secondary | ICD-10-CM | POA: Diagnosis not present

## 2020-10-09 DIAGNOSIS — R29898 Other symptoms and signs involving the musculoskeletal system: Secondary | ICD-10-CM | POA: Diagnosis not present

## 2020-10-09 DIAGNOSIS — M6281 Muscle weakness (generalized): Secondary | ICD-10-CM

## 2020-10-09 NOTE — Therapy (Signed)
[facility] [address] [place], [place], [address] Phone: [phone]   Fax:  [phone]  Physical Therapy Treatment  Patient Details  Name: Cheryl Ortiz MRN: [id number] Date of Birth: [birth year] Referring Provider (PT): Delice Lesch   Encounter Date: 10/09/2020   PT End of Session - 10/09/20 1108    Visit Number 4    Number of Visits 8    Date for PT Re-Evaluation 10/24/20    Authorization Type medicare para a and CHAMPVVA no auth or VL    Progress Note Due on Visit 10    PT Start Time 1110    PT Stop Time 1155    PT Time Calculation (min) 45 min    Equipment Utilized During Treatment Gait belt    Activity Tolerance Patient tolerated treatment well    Behavior During Therapy [facility] for tasks assessed/performed           Past Medical History:  Diagnosis Date  . Aneurysm ([place]) 06/2015   brain-small will recheck in 1 year  . Cold   . Cystocele 07/12/2015  . Gout   . Hyperlipidemia   . Hypertension   . Neuropathy of both feet    tingling/numbness bilateral feet ,rt arm,hands-nerve study scheduled  . Osteoarthritis   . Renal disorder    kidney stones  . Seasonal allergies   . Stroke ([place])   . Type 2 diabetes mellitus ([place])     Past Surgical History:  Procedure Laterality Date  . CHOLECYSTECTOMY  1999  . COLONOSCOPY N/A 06/15/2014   Procedure: COLONOSCOPY;  Surgeon: Rogene Houston, MD;  [facility];  Service: Endoscopy;  Laterality: N/A;  1145  . CYSTOSCOPY WITH RETROGRADE PYELOGRAM, URETEROSCOPY AND STENT PLACEMENT Bilateral 08/20/2015   Procedure: CYSTOSCOPY WITH RETROGRADE PYELOGRAM, URETEROSCOPY AND STENT PLACEMENT WITH STONE EXTRACTION WITH BASKET;  Surgeon: Cleon Gustin, MD;  Location: [facility];  Service: Urology;  Laterality: Bilateral;  . ESOPHAGEAL DILATION N/A 03/23/2015   Procedure: ESOPHAGEAL DILATION;  Surgeon: Rogene Houston, MD;  [facility];  Service:  Endoscopy;  Laterality: N/A;  . ESOPHAGOGASTRODUODENOSCOPY N/A 03/23/2015   Procedure: ESOPHAGOGASTRODUODENOSCOPY (EGD);  Surgeon: Rogene Houston, MD;  [facility];  Service: Endoscopy;  Laterality: N/A;  8:55 - moved to 12:35 - Ann to notify pt  . FOOT SURGERY Bilateral   . HOLMIUM LASER APPLICATION Bilateral 06/26/32   Procedure: HOLMIUM LASER APPLICATION;  Surgeon: Cleon Gustin, MD;  Location: [facility];  Service: Urology;  Laterality: Bilateral;  . VAGINAL HYSTERECTOMY  1984    There were no vitals filed for this visit.   Subjective Assessment - 10/09/20 1119    Subjective Pt notes some lingering unsteadiness but no LOB or falls reported.  Pt has been participating in group exercise class at [facility] 2x/wk on Tue/Thur    Patient Stated Goals to [place], go shopping, housework, care taker for her bother.    Currently in Pain? No/denies    Pain Score 0-No pain              [facility] PT Assessment - 10/09/20 0001      Assessment   Medical Diagnosis balance    Referring Provider (PT) Delice Lesch                         [facility] Adult PT Treatment/Exercise - 10/09/20 0001      Knee/Hip  Exercises: Standing   Heel Raises Both;2 sets;10 reps    Knee Flexion Strengthening;Both;3 sets;10 reps    Knee Flexion Limitations 10 lbs    Hip Flexion Stengthening;Both;3 sets;10 reps    Hip Flexion Limitations 10 lbs, 8" step for target    SLS 3x R/Lt 6-20 sec, improving with each trial    Gait Training resisted retro/forward walking with 3 plates 2x2 min. Side stepping x 2 min with 10 lbs ankle weights    Other Standing Knee Exercises tandem stance left/right with pt counting via 2's and 3's for cognitive challenge    Other Standing Knee Exercises trunk twists with 2kg med ball 2x10 and 2x10 standing on airex pad      Knee/Hip Exercises: Seated   Long Arc Quad Strengthening;Both;3 sets;10 reps    Long Arc Quad Weight 10 lbs.    Sit to Sand 2 sets;10  reps;without UE support   throwing 2kg med ball at top end                   PT Short Term Goals - 09/26/20 1500      PT SHORT TERM GOAL #1   Title Patient will be independent in self management strategies to improve quality of life and functional outcomes    Time 2    Period Weeks    Status New    Target Date 10/10/20      PT SHORT TERM GOAL #2   Title Patient will be able to stand on either leg for 10 seconds without upper extremity support to demonstrate improved static balance.    Time 2    Period Weeks    Status New    Target Date 10/10/20      PT SHORT TERM GOAL #3   Title Patient will report at least 25% improvement in overall symptoms and/or function to demonstrate improved functional mobility    Time 2    Period Weeks    Status New    Target Date 10/10/20             PT Long Term Goals - 09/26/20 1501      PT LONG TERM GOAL #1   Title Patient will report at least 50% improvement in overall symptoms and/or function to demonstrate improved functional mobility    Time 4    Period Weeks    Status New    Target Date 10/24/20      PT LONG TERM GOAL #2   Title Patient will be able to demonstrate at least 20/24 on DGI to demonstrate improved dynamic balance    Time 4    Period Weeks    Status New    Target Date 10/24/20      PT LONG TERM GOAL #3   Title Patient will report being able to walk downtown by herself to demonstrate improved confidence in walking and dynamic balance.    Time 4    Period Weeks    Status New    Target Date 10/24/20      PT LONG TERM GOAL #4   Title Patient will be able to stand on either leg for 15 seconds without upper extremity support to demonstrate improved static balance.    Time 4    Period Weeks    Status New    Target Date 10/24/20                 Plan - 10/09/20 1155    Clinical Impression  Statement Tolerating tx sessions well without instances of LOB or falls with good facilitation of righting  reactions when performing single leg balance and narrow BOS activities.  LLE weakness still present with difficulty in clearing 8" step LLE vs RLE (albeit with 10 lbs ankle weights on).  Continued POC indicated to improve LLE strength, improve dynamic balance, and reduce risk for falls    Personal Factors and Comorbidities Fitness    Examination-Activity Limitations Caring for Others;Carry;Locomotion Level;Dressing;Stairs    Examination-Participation Restrictions Church;Community Activity;Driving;Laundry;Meal Prep    Stability/Clinical Decision Making Stable/Uncomplicated    Rehab Potential Good    PT Frequency 2x / week    PT Duration 4 weeks    PT Treatment/Interventions ADLs/Self Care Home Management;Gait training;DME Instruction;Stair training;Functional mobility training;Therapeutic activities;Therapeutic exercise;Balance training;Neuromuscular re-education;Patient/family education;Manual techniques;Aquatic Therapy    PT Next Visit Plan focus on dynamic balance but also add in static balance    PT Home Exercise Plan SLS with UE support - only as much as needed; 5/17 tandem, STS, lateral stepping    Consulted and Agree with Plan of Care Patient           Patient will benefit from skilled therapeutic intervention in order to improve the following deficits and impairments:  Abnormal gait,Decreased activity tolerance,Decreased balance,Difficulty walking,Decreased strength,Pain  Visit Diagnosis: Balance problem  Difficulty in walking, not elsewhere classified  Muscle weakness (generalized)  Other symptoms and signs involving the musculoskeletal system     Problem List Patient Active Problem List   Diagnosis Date Noted  . Hemiparesis affecting left side as late effect of stroke ([place]) 09/13/2020  . Alteration of sensation as late effect of stroke 09/13/2020  . Dysesthesia 09/13/2020  . Abnormality of gait 09/13/2020  . Cognitive deficit, post-stroke 09/13/2020  . Pressure injury  of skin 07/18/2020  . Labile blood pressure   . Left foot pain   . Attention and concentration deficit   . History of hypertension   . Hypotension due to drugs   . Labile blood glucose   . Hypokalemia   . Essential hypertension   . Controlled type 2 diabetes mellitus with hyperglycemia, without long-term current use of insulin ([place])   . ICH (intracerebral hemorrhage) ([place]) 06/23/2020  . Pelvic floor relaxation 03/07/2020  . Screening for colorectal cancer 12/09/2018  . 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 Snoring 06/08/2018  . Poor sleep hygiene 06/08/2018  . Chronic insomnia 06/08/2018  . Encounter for well woman exam with routine gynecological exam 09/09/2017  . Female cystocele 09/09/2017  . Cystocele 07/12/2015  . Precordial pain 02/18/2013  . Type 2 diabetes mellitus ([place]) 02/18/2013  . Hyperlipidemia 02/17/2013  . Essential hypertension, benign 02/17/2013   12:02 PM, 10/09/20 M. Sherlyn Lees, PT, DPT Physical Therapist- [place] Office Number: [phone]  [facility] [address] [place], [place], [address] Phone: [phone]   Fax:  [phone]  Name: Cheryl Ortiz MRN: [id number] Date of Birth: 1950/11/26

## 2020-10-10 ENCOUNTER — Ambulatory Visit (HOSPITAL_COMMUNITY): Payer: Medicare Other | Admitting: Physical Therapy

## 2020-10-10 DIAGNOSIS — Z7984 Long term (current) use of oral hypoglycemic drugs: Secondary | ICD-10-CM | POA: Diagnosis not present

## 2020-10-10 DIAGNOSIS — H2513 Age-related nuclear cataract, bilateral: Secondary | ICD-10-CM | POA: Diagnosis not present

## 2020-10-10 DIAGNOSIS — H5203 Hypermetropia, bilateral: Secondary | ICD-10-CM | POA: Diagnosis not present

## 2020-10-10 DIAGNOSIS — H52223 Regular astigmatism, bilateral: Secondary | ICD-10-CM | POA: Diagnosis not present

## 2020-10-10 DIAGNOSIS — E119 Type 2 diabetes mellitus without complications: Secondary | ICD-10-CM | POA: Diagnosis not present

## 2020-10-10 DIAGNOSIS — H524 Presbyopia: Secondary | ICD-10-CM | POA: Diagnosis not present

## 2020-10-12 ENCOUNTER — Other Ambulatory Visit: Payer: Self-pay

## 2020-10-12 ENCOUNTER — Telehealth: Payer: Self-pay | Admitting: Neurology

## 2020-10-12 ENCOUNTER — Ambulatory Visit (HOSPITAL_COMMUNITY): Payer: Medicare Other | Admitting: Physical Therapy

## 2020-10-12 DIAGNOSIS — R29898 Other symptoms and signs involving the musculoskeletal system: Secondary | ICD-10-CM | POA: Diagnosis not present

## 2020-10-12 DIAGNOSIS — M6281 Muscle weakness (generalized): Secondary | ICD-10-CM

## 2020-10-12 DIAGNOSIS — R262 Difficulty in walking, not elsewhere classified: Secondary | ICD-10-CM

## 2020-10-12 DIAGNOSIS — I611 Nontraumatic intracerebral hemorrhage in hemisphere, cortical: Secondary | ICD-10-CM

## 2020-10-12 DIAGNOSIS — I671 Cerebral aneurysm, nonruptured: Secondary | ICD-10-CM

## 2020-10-12 DIAGNOSIS — R2689 Other abnormalities of gait and mobility: Secondary | ICD-10-CM | POA: Diagnosis not present

## 2020-10-12 NOTE — Telephone Encounter (Signed)
MR Angio head & MR brain w/wo auth: NPR  Sent to GI for scheduling

## 2020-10-12 NOTE — Therapy (Signed)
[place], [place], [address] Phone: [phone]   Fax:  [phone]  Physical Therapy Treatment  Patient Details  Name: Cheryl Ortiz MRN: [id number] Date of Birth: 06-May-1951 Referring Provider (PT): Delice Lesch   Encounter Date: 10/12/2020   PT End of Session - 10/12/20 1312    Visit Number 5    Number of Visits 8    Date for PT Re-Evaluation 10/24/20    Authorization Type medicare para a and CHAMPVVA no auth or VL    Progress Note Due on Visit 8    PT Start Time 1315    PT Stop Time 1355    PT Time Calculation (min) 40 min    Equipment Utilized During Treatment Gait belt    Activity Tolerance Patient tolerated treatment well    Behavior During Therapy [facility] for tasks assessed/performed           Past Medical History:  Diagnosis Date  . Aneurysm ([place]) 06/2015   brain-small will recheck in 1 year  . Cold   . Cystocele 07/12/2015  . Gout   . Hyperlipidemia   . Hypertension   . Neuropathy of both feet    tingling/numbness bilateral feet ,rt arm,hands-nerve study scheduled  . Osteoarthritis   . Renal disorder    kidney stones  . Seasonal allergies   . Stroke ([place])   . Type 2 diabetes mellitus ([place])     Past Surgical History:  Procedure Laterality Date  . CHOLECYSTECTOMY  1999  . COLONOSCOPY N/A 06/15/2014   Procedure: COLONOSCOPY;  Surgeon: Rogene Houston, MD;  [facility];  Service: Endoscopy;  Laterality: N/A;  1145  . CYSTOSCOPY WITH RETROGRADE PYELOGRAM, URETEROSCOPY AND STENT PLACEMENT Bilateral 08/20/2015   Procedure: CYSTOSCOPY WITH RETROGRADE PYELOGRAM, URETEROSCOPY AND STENT PLACEMENT WITH STONE EXTRACTION WITH BASKET;  Surgeon: Cleon Gustin, MD;  Location: [facility];  Service: Urology;  Laterality: Bilateral;  . ESOPHAGEAL DILATION N/A 03/23/2015   Procedure: ESOPHAGEAL DILATION;  Surgeon: Rogene Houston, MD;  [facility];  Service:  Endoscopy;  Laterality: N/A;  . ESOPHAGOGASTRODUODENOSCOPY N/A 03/23/2015   Procedure: ESOPHAGOGASTRODUODENOSCOPY (EGD);  Surgeon: Rogene Houston, MD;  [facility];  Service: Endoscopy;  Laterality: N/A;  8:55 - moved to 12:35 - Ann to notify pt  . FOOT SURGERY Bilateral   . HOLMIUM LASER APPLICATION Bilateral 07/25/2503   Procedure: HOLMIUM LASER APPLICATION;  Surgeon: Cleon Gustin, MD;  Location: [facility];  Service: Urology;  Laterality: Bilateral;  . VAGINAL HYSTERECTOMY  1984    There were no vitals filed for this visit.   Subjective Assessment - 10/12/20 1314    Subjective No pain, she has been completing exercises at home.    How long can you walk comfortably? PT has walked with a walker for 10 minutes with the therapist at [facility].    Patient Stated Goals to Drive, go shopping, housework, care taker for her bother.    Currently in Pain? No/denies                      Balance Exercises - 10/12/20 0001      Balance Exercises: Standing   Tandem Gait Forward;2 reps;Foam/compliant surface    Retro Gait Foam/compliant surface;2 reps    Sidestepping Foam/compliant support;2 reps    Marching Solid surface;Static   with opposite arm up moving down the hall  Heel Raises 15 reps    Toe Raise 15 reps    Sit to Stand Standard surface               PT Short Term Goals - 09/26/20 1500      PT SHORT TERM GOAL #1   Title Patient will be independent in self management strategies to improve quality of life and functional outcomes    Time 2    Period Weeks    Status New    Target Date 10/10/20      PT SHORT TERM GOAL #2   Title Patient will be able to stand on either leg for 10 seconds without upper extremity support to demonstrate improved static balance.    Time 2    Period Weeks    Status New    Target Date 10/10/20      PT SHORT TERM GOAL #3   Title Patient will report at least 25% improvement in overall symptoms and/or  function to demonstrate improved functional mobility    Time 2    Period Weeks    Status New    Target Date 10/10/20             PT Long Term Goals - 09/26/20 1501      PT LONG TERM GOAL #1   Title Patient will report at least 50% improvement in overall symptoms and/or function to demonstrate improved functional mobility    Time 4    Period Weeks    Status New    Target Date 10/24/20      PT LONG TERM GOAL #2   Title Patient will be able to demonstrate at least 20/24 on DGI to demonstrate improved dynamic balance    Time 4    Period Weeks    Status New    Target Date 10/24/20      PT LONG TERM GOAL #3   Title Patient will report being able to walk downtown by herself to demonstrate improved confidence in walking and dynamic balance.    Time 4    Period Weeks    Status New    Target Date 10/24/20      PT LONG TERM GOAL #4   Title Patient will be able to stand on either leg for 15 seconds without upper extremity support to demonstrate improved static balance.    Time 4    Period Weeks    Status New    Target Date 10/24/20                 Plan - 10/12/20 1312    Clinical Impression Statement Pt needed min assist with foam activities with pt losing balance to the left.  Pt needed several rest breaks throughout treatment.    Personal Factors and Comorbidities Fitness    Examination-Activity Limitations Caring for Others;Carry;Locomotion Level;Dressing;Stairs    Examination-Participation Restrictions Church;Community Activity;Driving;Laundry;Meal Prep    Stability/Clinical Decision Making Stable/Uncomplicated    Rehab Potential Good    PT Frequency 2x / week    PT Duration 4 weeks    PT Treatment/Interventions ADLs/Self Care Home Management;Gait training;DME Instruction;Stair training;Functional mobility training;Therapeutic activities;Therapeutic exercise;Balance training;Neuromuscular re-education;Patient/family education;Manual techniques;Aquatic Therapy    PT  Next Visit Plan focus on dynamic balance but also add in static balance    PT Home Exercise Plan SLS with UE support - only as much as needed; 5/17 tandem, STS, lateral stepping           Patient will benefit  from skilled therapeutic intervention in order to improve the following deficits and impairments:  Abnormal gait,Decreased activity tolerance,Decreased balance,Difficulty walking,Decreased strength,Pain  Visit Diagnosis: Balance problem  Difficulty in walking, not elsewhere classified  Muscle weakness (generalized)     Problem List Patient Active Problem List   Diagnosis Date Noted  . Hemiparesis affecting left side as late effect of stroke ([place]) 09/13/2020  . Alteration of sensation as late effect of stroke 09/13/2020  . Dysesthesia 09/13/2020  . Abnormality of gait 09/13/2020  . Cognitive deficit, post-stroke 09/13/2020  . Pressure injury of skin 07/18/2020  . Labile blood pressure   . Left foot pain   . Attention and concentration deficit   . History of hypertension   . Hypotension due to drugs   . Labile blood glucose   . Hypokalemia   . Essential hypertension   . Controlled type 2 diabetes mellitus with hyperglycemia, without long-term current use of insulin ([place])   . ICH (intracerebral hemorrhage) ([place]) 06/23/2020  . Pelvic floor relaxation 03/07/2020  . Screening for colorectal cancer 12/09/2018  . 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 Snoring 06/08/2018  . Poor sleep hygiene 06/08/2018  . Chronic insomnia 06/08/2018  . Encounter for well woman exam with routine gynecological exam 09/09/2017  . Female cystocele 09/09/2017  . Cystocele 07/12/2015  . Precordial pain 02/18/2013  . Type 2 diabetes mellitus ([place]) 02/18/2013  . Hyperlipidemia 02/17/2013  . Essential hypertension, benign 02/17/2013    Rayetta Humphrey, PT CLT [phone] 10/12/2020, 1:55 PM  [place], [place], [address] Phone: [phone]   Fax:  [phone]  Name: Cheryl Ortiz MRN: [id number] Date of Birth: 08/21/1950

## 2020-10-12 NOTE — Telephone Encounter (Signed)
I called the patient.  We will check MRI of the brain and MRA of the head again as follow-up.  The patient is amenable to this.

## 2020-10-16 DIAGNOSIS — E876 Hypokalemia: Secondary | ICD-10-CM | POA: Diagnosis not present

## 2020-10-17 ENCOUNTER — Other Ambulatory Visit: Payer: Self-pay

## 2020-10-17 ENCOUNTER — Ambulatory Visit (HOSPITAL_COMMUNITY): Payer: Medicare Other | Attending: Physical Medicine & Rehabilitation | Admitting: Physical Therapy

## 2020-10-17 DIAGNOSIS — M6281 Muscle weakness (generalized): Secondary | ICD-10-CM | POA: Insufficient documentation

## 2020-10-17 DIAGNOSIS — R262 Difficulty in walking, not elsewhere classified: Secondary | ICD-10-CM | POA: Diagnosis not present

## 2020-10-17 DIAGNOSIS — R2689 Other abnormalities of gait and mobility: Secondary | ICD-10-CM | POA: Diagnosis not present

## 2020-10-17 NOTE — Therapy (Signed)
[facility] [address] [place], [place], [address] Phone: [phone]   Fax:  [phone]  Physical Therapy Treatment  Patient Details  Name: Cheryl Ortiz MRN: [id number] Date of Birth: 1951/01/08 Referring Provider (PT): Delice Lesch   Encounter Date: 10/17/2020   PT End of Session - 10/17/20 1552    Visit Number 6    Number of Visits 8    Date for PT Re-Evaluation 10/24/20    Authorization Type medicare para a and CHAMPVVA no auth or VL    Progress Note Due on Visit 8    PT Start Time 1402    PT Stop Time 1443    PT Time Calculation (min) 41 min    Equipment Utilized During Treatment Gait belt    Activity Tolerance Patient tolerated treatment well    Behavior During Therapy [facility] for tasks assessed/performed           Past Medical History:  Diagnosis Date  . Aneurysm ([place]) 06/2015   brain-small will recheck in 1 year  . Cold   . Cystocele 07/12/2015  . Gout   . Hyperlipidemia   . Hypertension   . Neuropathy of both feet    tingling/numbness bilateral feet ,rt arm,hands-nerve study scheduled  . Osteoarthritis   . Renal disorder    kidney stones  . Seasonal allergies   . Stroke ([place])   . Type 2 diabetes mellitus ([place])     Past Surgical History:  Procedure Laterality Date  . CHOLECYSTECTOMY  1999  . COLONOSCOPY N/A 06/15/2014   Procedure: COLONOSCOPY;  Surgeon: Rogene Houston, MD;  [facility];  Service: Endoscopy;  Laterality: N/A;  1145  . CYSTOSCOPY WITH RETROGRADE PYELOGRAM, URETEROSCOPY AND STENT PLACEMENT Bilateral 08/20/2015   Procedure: CYSTOSCOPY WITH RETROGRADE PYELOGRAM, URETEROSCOPY AND STENT PLACEMENT WITH STONE EXTRACTION WITH BASKET;  Surgeon: Cleon Gustin, MD;  Location: [facility];  Service: Urology;  Laterality: Bilateral;  . ESOPHAGEAL DILATION N/A 03/23/2015   Procedure: ESOPHAGEAL DILATION;  Surgeon: Rogene Houston, MD;  [facility];  Service:  Endoscopy;  Laterality: N/A;  . ESOPHAGOGASTRODUODENOSCOPY N/A 03/23/2015   Procedure: ESOPHAGOGASTRODUODENOSCOPY (EGD);  Surgeon: Rogene Houston, MD;  [facility];  Service: Endoscopy;  Laterality: N/A;  8:55 - moved to 12:35 - Ann to notify pt  . FOOT SURGERY Bilateral   . HOLMIUM LASER APPLICATION Bilateral 06/23/8525   Procedure: HOLMIUM LASER APPLICATION;  Surgeon: Cleon Gustin, MD;  Location: [facility];  Service: Urology;  Laterality: Bilateral;  . VAGINAL HYSTERECTOMY  1984    There were no vitals filed for this visit.   Subjective Assessment - 10/17/20 1403    Subjective pt reports no pain or issues    Currently in Pain? No/denies                             [facility] Adult PT Treatment/Exercise - 10/17/20 0001      Knee/Hip Exercises: Standing   Heel Raises 15 reps (P)     Heel Raises Limitations toeraises 15 reps both on incline (P)     Hip Flexion Both;15 reps (P)     Hip Flexion Limitations alternating with 3" holds (P)       Knee/Hip Exercises: Seated   Sit to Sand 2 sets;10 reps;without UE support (P)  Balance Exercises - 10/17/20 1446      Balance Exercises: Standing   Tandem Gait Forward;2 reps;Foam/compliant surface    Retro Gait Foam/compliant surface;2 reps    Sidestepping Foam/compliant support;2 reps    Other Standing Exercises tandem stance each LE lead with head turns 10X               PT Short Term Goals - 09/26/20 1500      PT SHORT TERM GOAL #1   Title Patient will be independent in self management strategies to improve quality of life and functional outcomes    Time 2    Period Weeks    Status New    Target Date 10/10/20      PT SHORT TERM GOAL #2   Title Patient will be able to stand on either leg for 10 seconds without upper extremity support to demonstrate improved static balance.    Time 2    Period Weeks    Status New    Target Date 10/10/20      PT SHORT TERM  GOAL #3   Title Patient will report at least 25% improvement in overall symptoms and/or function to demonstrate improved functional mobility    Time 2    Period Weeks    Status New    Target Date 10/10/20             PT Long Term Goals - 09/26/20 1501      PT LONG TERM GOAL #1   Title Patient will report at least 50% improvement in overall symptoms and/or function to demonstrate improved functional mobility    Time 4    Period Weeks    Status New    Target Date 10/24/20      PT LONG TERM GOAL #2   Title Patient will be able to demonstrate at least 20/24 on DGI to demonstrate improved dynamic balance    Time 4    Period Weeks    Status New    Target Date 10/24/20      PT LONG TERM GOAL #3   Title Patient will report being able to walk downtown by herself to demonstrate improved confidence in walking and dynamic balance.    Time 4    Period Weeks    Status New    Target Date 10/24/20      PT LONG TERM GOAL #4   Title Patient will be able to stand on either leg for 15 seconds without upper extremity support to demonstrate improved static balance.    Time 4    Period Weeks    Status New    Target Date 10/24/20                 Plan - 10/17/20 1551    Clinical Impression Statement Continued with LE strengthening and balance challenges.  Pt able to complete exercises in good form. Standing tandem added this session with head turns challenging initially but when cued to complete slowly/controlled was able to maintain without LOB for all 10 repetitions.  Balance beam foam surface continues to be most challenging for patient, especially lateral movements.  Pt required multiple short rest breaks during session today due to fatigue.    Personal Factors and Comorbidities Fitness    Examination-Activity Limitations Caring for Others;Carry;Locomotion Level;Dressing;Stairs    Examination-Participation Restrictions Church;Community Activity;Driving;Laundry;Meal Prep     Stability/Clinical Decision Making Stable/Uncomplicated    Rehab Potential Good    PT Frequency 2x / week  PT Duration 4 weeks    PT Treatment/Interventions ADLs/Self Care Home Management;Gait training;DME Instruction;Stair training;Functional mobility training;Therapeutic activities;Therapeutic exercise;Balance training;Neuromuscular re-education;Patient/family education;Manual techniques;Aquatic Therapy    PT Next Visit Plan focus on both static and dynamic balance challenges.    PT Home Exercise Plan SLS with UE support - only as much as needed; 5/17 tandem, STS, lateral stepping           Patient will benefit from skilled therapeutic intervention in order to improve the following deficits and impairments:  Abnormal gait,Decreased activity tolerance,Decreased balance,Difficulty walking,Decreased strength,Pain  Visit Diagnosis: Balance problem  Difficulty in walking, not elsewhere classified  Muscle weakness (generalized)     Problem List Patient Active Problem List   Diagnosis Date Noted  . Hemiparesis affecting left side as late effect of stroke ([place]) 09/13/2020  . Alteration of sensation as late effect of stroke 09/13/2020  . Dysesthesia 09/13/2020  . Abnormality of gait 09/13/2020  . Cognitive deficit, post-stroke 09/13/2020  . Pressure injury of skin 07/18/2020  . Labile blood pressure   . Left foot pain   . Attention and concentration deficit   . History of hypertension   . Hypotension due to drugs   . Labile blood glucose   . Hypokalemia   . Essential hypertension   . Controlled type 2 diabetes mellitus with hyperglycemia, without long-term current use of insulin ([place])   . ICH (intracerebral hemorrhage) ([place]) 06/23/2020  . Pelvic floor relaxation 03/07/2020  . Screening for colorectal cancer 12/09/2018  . 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 Snoring 06/08/2018  . Poor sleep hygiene 06/08/2018  . Chronic insomnia 06/08/2018  .  Encounter for well woman exam with routine gynecological exam 09/09/2017  . Female cystocele 09/09/2017  . Cystocele 07/12/2015  . Precordial pain 02/18/2013  . Type 2 diabetes mellitus ([place]) 02/18/2013  . Hyperlipidemia 02/17/2013  . Essential hypertension, benign 02/17/2013   Teena Irani, PTA/CLT [phone]   Teena Irani 10/17/2020, 3:54 PM  [facility] [address] [place], [place], [address] Phone: [phone]   Fax:  [phone]  Name: Cheryl Ortiz MRN: [id number] Date of Birth: 04/17/1951

## 2020-10-18 ENCOUNTER — Encounter (HOSPITAL_COMMUNITY): Payer: Self-pay | Admitting: Physical Therapy

## 2020-10-18 ENCOUNTER — Ambulatory Visit (HOSPITAL_COMMUNITY): Payer: Medicare Other | Admitting: Physical Therapy

## 2020-10-18 DIAGNOSIS — R262 Difficulty in walking, not elsewhere classified: Secondary | ICD-10-CM | POA: Diagnosis not present

## 2020-10-18 DIAGNOSIS — R2689 Other abnormalities of gait and mobility: Secondary | ICD-10-CM | POA: Diagnosis not present

## 2020-10-18 DIAGNOSIS — M6281 Muscle weakness (generalized): Secondary | ICD-10-CM | POA: Diagnosis not present

## 2020-10-18 NOTE — Therapy (Signed)
[facility] [address] [place], [place], [address] Phone: [phone]   Fax:  [phone]  Physical Therapy Treatment  Patient Details  Name: Cheryl Ortiz MRN: [id number] Date of Birth: [birth year] Referring Provider (PT): Delice Lesch   Encounter Date: 10/18/2020   PT End of Session - 10/18/20 1451    Visit Number 7    Number of Visits 8    Date for PT Re-Evaluation 10/24/20    Authorization Type medicare para a and CHAMPVVA no auth or VL    Progress Note Due on Visit 8    PT Start Time 1451    PT Stop Time 1530    PT Time Calculation (min) 39 min    Equipment Utilized During Treatment Gait belt    Activity Tolerance Patient tolerated treatment well    Behavior During Therapy [facility] for tasks assessed/performed           Past Medical History:  Diagnosis Date  . Aneurysm ([place]) 06/2015   brain-small will recheck in 1 year  . Cold   . Cystocele 07/12/2015  . Gout   . Hyperlipidemia   . Hypertension   . Neuropathy of both feet    tingling/numbness bilateral feet ,rt arm,hands-nerve study scheduled  . Osteoarthritis   . Renal disorder    kidney stones  . Seasonal allergies   . Stroke ([place])   . Type 2 diabetes mellitus ([place])     Past Surgical History:  Procedure Laterality Date  . CHOLECYSTECTOMY  1999  . COLONOSCOPY N/A 06/15/2014   Procedure: COLONOSCOPY;  Surgeon: Rogene Houston, MD;  [facility];  Service: Endoscopy;  Laterality: N/A;  1145  . CYSTOSCOPY WITH RETROGRADE PYELOGRAM, URETEROSCOPY AND STENT PLACEMENT Bilateral 08/20/2015   Procedure: CYSTOSCOPY WITH RETROGRADE PYELOGRAM, URETEROSCOPY AND STENT PLACEMENT WITH STONE EXTRACTION WITH BASKET;  Surgeon: Cleon Gustin, MD;  Location: [facility];  Service: Urology;  Laterality: Bilateral;  . ESOPHAGEAL DILATION N/A 03/23/2015   Procedure: ESOPHAGEAL DILATION;  Surgeon: Rogene Houston, MD;  [facility];  Service:  Endoscopy;  Laterality: N/A;  . ESOPHAGOGASTRODUODENOSCOPY N/A 03/23/2015   Procedure: ESOPHAGOGASTRODUODENOSCOPY (EGD);  Surgeon: Rogene Houston, MD;  [facility];  Service: Endoscopy;  Laterality: N/A;  8:55 - moved to 12:35 - Ann to notify pt  . FOOT SURGERY Bilateral   . HOLMIUM LASER APPLICATION Bilateral 09/19/84   Procedure: HOLMIUM LASER APPLICATION;  Surgeon: Cleon Gustin, MD;  Location: [facility];  Service: Urology;  Laterality: Bilateral;  . VAGINAL HYSTERECTOMY  1984    There were no vitals filed for this visit.   Subjective Assessment - 10/18/20 1451    Subjective Patient states she is tired from having therapy yesterday and was busy with her classes today.    Currently in Pain? No/denies                             [facility] Adult PT Treatment/Exercise - 10/18/20 0001      Knee/Hip Exercises: Standing   Hip Flexion Both;1 set;10 reps    Hip Flexion Limitations 3 second holds alternating without UE support; also 1x 15 LLE    Other Standing Knee Exercises tandem stance on blue foam 3x 30 seconds bilateral    Other Standing Knee Exercises lateral stepping 8x 15  PT Short Term Goals - 09/26/20 1500      PT SHORT TERM GOAL #1   Title Patient will be independent in self management strategies to improve quality of life and functional outcomes    Time 2    Period Weeks    Status New    Target Date 10/10/20      PT SHORT TERM GOAL #2   Title Patient will be able to stand on either leg for 10 seconds without upper extremity support to demonstrate improved static balance.    Time 2    Period Weeks    Status New    Target Date 10/10/20      PT SHORT TERM GOAL #3   Title Patient will report at least 25% improvement in overall symptoms and/or function to demonstrate improved functional mobility    Time 2    Period Weeks    Status New    Target Date 10/10/20             PT Long Term  Goals - 09/26/20 1501      PT LONG TERM GOAL #1   Title Patient will report at least 50% improvement in overall symptoms and/or function to demonstrate improved functional mobility    Time 4    Period Weeks    Status New    Target Date 10/24/20      PT LONG TERM GOAL #2   Title Patient will be able to demonstrate at least 20/24 on DGI to demonstrate improved dynamic balance    Time 4    Period Weeks    Status New    Target Date 10/24/20      PT LONG TERM GOAL #3   Title Patient will report being able to walk downtown by herself to demonstrate improved confidence in walking and dynamic balance.    Time 4    Period Weeks    Status New    Target Date 10/24/20      PT LONG TERM GOAL #4   Title Patient will be able to stand on either leg for 15 seconds without upper extremity support to demonstrate improved static balance.    Time 4    Period Weeks    Status New    Target Date 10/24/20                 Plan - 10/18/20 1451    Clinical Impression Statement Patient states continued difficulty with static and dynamic balance. Patient given frequent cueing for mechanics of marching and limiting UE support when completing with fair carry over. She continues to use UE support and patient is moved away from bars to complete. Patient with increased difficulty with completing marching with RLE stance secondary to c/o L leg "heaviness". Patient educated on possible change in baseline for balance and mobility. Patient requires intermittent HHA with tandem stance on foam and shows min/mod sway. Patient will continue to benefit from skilled physical therapy in order to reduce impairment and improve function.    Personal Factors and Comorbidities Fitness    Examination-Activity Limitations Caring for Others;Carry;Locomotion Level;Dressing;Stairs    Examination-Participation Restrictions Church;Community Activity;Driving;Laundry;Meal Prep    Stability/Clinical Decision Making  Stable/Uncomplicated    Rehab Potential Good    PT Frequency 2x / week    PT Duration 4 weeks    PT Treatment/Interventions ADLs/Self Care Home Management;Gait training;DME Instruction;Stair training;Functional mobility training;Therapeutic activities;Therapeutic exercise;Balance training;Neuromuscular re-education;Patient/family education;Manual techniques;Aquatic Therapy    PT Next Visit Plan focus  on both static and dynamic balance challenges.    PT Home Exercise Plan SLS with UE support - only as much as needed; 5/17 tandem, STS, lateral stepping           Patient will benefit from skilled therapeutic intervention in order to improve the following deficits and impairments:  Abnormal gait,Decreased activity tolerance,Decreased balance,Difficulty walking,Decreased strength,Pain  Visit Diagnosis: Balance problem  Difficulty in walking, not elsewhere classified  Muscle weakness (generalized)     Problem List Patient Active Problem List   Diagnosis Date Noted  . Hemiparesis affecting left side as late effect of stroke ([place]) 09/13/2020  . Alteration of sensation as late effect of stroke 09/13/2020  . Dysesthesia 09/13/2020  . Abnormality of gait 09/13/2020  . Cognitive deficit, post-stroke 09/13/2020  . Pressure injury of skin 07/18/2020  . Labile blood pressure   . Left foot pain   . Attention and concentration deficit   . History of hypertension   . Hypotension due to drugs   . Labile blood glucose   . Hypokalemia   . Essential hypertension   . Controlled type 2 diabetes mellitus with hyperglycemia, without long-term current use of insulin ([place])   . ICH (intracerebral hemorrhage) ([place]) 06/23/2020  . Pelvic floor relaxation 03/07/2020  . Screening for colorectal cancer 12/09/2018  . 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 Snoring 06/08/2018  . Poor sleep hygiene 06/08/2018  . Chronic insomnia 06/08/2018  . Encounter for well woman exam with routine  gynecological exam 09/09/2017  . Female cystocele 09/09/2017  . Cystocele 07/12/2015  . Precordial pain 02/18/2013  . Type 2 diabetes mellitus ([place]) 02/18/2013  . Hyperlipidemia 02/17/2013  . Essential hypertension, benign 02/17/2013    3:33 PM, 10/18/20 Mearl Latin PT, DPT Physical Therapist at [facility] [place], [place], [address] Phone: [phone]   Fax:  [phone]  Name: LOTUS SANTILLO MRN: [id number] Date of Birth: February 21, 1951

## 2020-10-23 ENCOUNTER — Encounter (HOSPITAL_COMMUNITY): Payer: Self-pay | Admitting: Physical Therapy

## 2020-10-23 ENCOUNTER — Other Ambulatory Visit: Payer: Self-pay

## 2020-10-23 ENCOUNTER — Ambulatory Visit (HOSPITAL_COMMUNITY): Payer: Medicare Other | Admitting: Physical Therapy

## 2020-10-23 DIAGNOSIS — R262 Difficulty in walking, not elsewhere classified: Secondary | ICD-10-CM | POA: Diagnosis not present

## 2020-10-23 DIAGNOSIS — R2689 Other abnormalities of gait and mobility: Secondary | ICD-10-CM

## 2020-10-23 DIAGNOSIS — M6281 Muscle weakness (generalized): Secondary | ICD-10-CM

## 2020-10-23 NOTE — Therapy (Signed)
[facility] [address] [place], [place], [address] Phone: [phone]   Fax:  [phone]  Physical Therapy Treatment and Discharge Note  Patient Details  Name: Cheryl Ortiz MRN: [id number] Date of Birth: December 16, 1950 Referring Provider (PT): Delice Lesch  PHYSICAL THERAPY DISCHARGE SUMMARY  Visits from Start of Care: 8  Current functional level related to goals / functional outcomes: See below   Remaining deficits: See below   Education / Equipment: See below  Plan: Patient agrees to discharge.  Patient goals were partially met. Patient is being discharged due to being pleased with the current functional level.  ?????       Encounter Date: 10/23/2020   PT End of Session - 10/23/20 1130    Visit Number 8    Number of Visits 8    Date for PT Re-Evaluation 10/24/20    Authorization Type medicare para a and [employer] no auth or VL    Progress Note Due on Visit 8    PT Start Time 1132    PT Stop Time 1149    PT Time Calculation (min) 17 min    Equipment Utilized During Treatment Gait belt    Activity Tolerance Patient tolerated treatment well    Behavior During Therapy WFL for tasks assessed/performed           Past Medical History:  Diagnosis Date  . Aneurysm ([place]) 06/2015   brain-small will recheck in 1 year  . Cold   . Cystocele 07/12/2015  . Gout   . Hyperlipidemia   . Hypertension   . Neuropathy of both feet    tingling/numbness bilateral feet ,rt arm,hands-nerve study scheduled  . Osteoarthritis   . Renal disorder    kidney stones  . Seasonal allergies   . Stroke ([place])   . Type 2 diabetes mellitus ([place])     Past Surgical History:  Procedure Laterality Date  . CHOLECYSTECTOMY  1999  . COLONOSCOPY N/A 06/15/2014   Procedure: COLONOSCOPY;  Surgeon: Rogene Houston, MD;  [facility];  Service: Endoscopy;  Laterality: N/A;  1145  . CYSTOSCOPY WITH RETROGRADE PYELOGRAM, URETEROSCOPY AND STENT  PLACEMENT Bilateral 08/20/2015   Procedure: CYSTOSCOPY WITH RETROGRADE PYELOGRAM, URETEROSCOPY AND STENT PLACEMENT WITH STONE EXTRACTION WITH BASKET;  Surgeon: Cleon Gustin, MD;  Location: [facility];  Service: Urology;  Laterality: Bilateral;  . ESOPHAGEAL DILATION N/A 03/23/2015   Procedure: ESOPHAGEAL DILATION;  Surgeon: Rogene Houston, MD;  [facility];  Service: Endoscopy;  Laterality: N/A;  . ESOPHAGOGASTRODUODENOSCOPY N/A 03/23/2015   Procedure: ESOPHAGOGASTRODUODENOSCOPY (EGD);  Surgeon: Rogene Houston, MD;  [facility];  Service: Endoscopy;  Laterality: N/A;  8:55 - moved to 12:35 - Ann to notify pt  . FOOT SURGERY Bilateral   . HOLMIUM LASER APPLICATION Bilateral 01/24/9891   Procedure: HOLMIUM LASER APPLICATION;  Surgeon: Cleon Gustin, MD;  Location: [facility];  Service: Urology;  Laterality: Bilateral;  . VAGINAL HYSTERECTOMY  1984    There were no vitals filed for this visit.       [facility] PT Assessment - 10/23/20 0001      Assessment   Medical Diagnosis balance    Referring Provider (PT) Patel Ankit      Static Standing Balance   Static Standing - Level of Assistance 5: Stand by assistance    Static Standing Balance -  Activities  Single Leg Stance - Right Leg;Single Leg Stance -  Left Leg    Static Standing - Comment/# of Minutes 6 seconds R, 20 seconds left      Dynamic Gait Index   Level Surface Normal    Change in Gait Speed Normal    Gait with Horizontal Head Turns Normal    Gait with Vertical Head Turns Normal    Gait and Pivot Turn Normal    Step Over Obstacle Normal    Step Around Obstacles Normal    Steps Normal    Total Score 24                                 PT Education - 10/23/20 1149    Education Details on HEP, on progress, on plan moving forward    Person(s) Educated Patient    Methods Explanation    Comprehension Verbalized understanding            PT  Short Term Goals - 10/23/20 1134      PT SHORT TERM GOAL #1   Title Patient will be independent in self management strategies to improve quality of life and functional outcomes    Time 2    Period Weeks    Status Achieved    Target Date 10/10/20      PT SHORT TERM GOAL #2   Title Patient will be able to stand on either leg for 10 seconds without upper extremity support to demonstrate improved static balance.    Baseline 6 seconds R, 20 seconds left    Time 2    Period Weeks    Status Partially Met    Target Date 10/10/20      PT SHORT TERM GOAL #3   Title Patient will report at least 25% improvement in overall symptoms and/or function to demonstrate improved functional mobility    Baseline states overall sh efeels 90% better    Time 2    Period Weeks    Status Achieved    Target Date 10/10/20             PT Long Term Goals - 10/23/20 1135      PT LONG TERM GOAL #1   Title Patient will report at least 50% improvement in overall symptoms and/or function to demonstrate improved functional mobility    Baseline 90% better    Time 4    Period Weeks    Status Achieved      PT LONG TERM GOAL #2   Title Patient will be able to demonstrate at least 20/24 on DGI to demonstrate improved dynamic balance    Time 4    Period Weeks    Status Achieved      PT LONG TERM GOAL #3   Title Patient will report being able to walk downtown by herself to demonstrate improved confidence in walking and dynamic balance.    Time 4    Period Weeks    Status Achieved      PT LONG TERM GOAL #4   Title Patient will be able to stand on either leg for 15 seconds without upper extremity support to demonstrate improved static balance.    Baseline 6 seconds R, 20 seconds left    Time 4    Period Weeks    Status Partially Met                 Plan - 10/23/20 1158    Clinical Impression Statement All  but single leg stance goal met at this time. REviewed HEP and answered all questions. DGI  score of 24/24. Patient independent in HEP and going to [facility] daily, patient to discharge from PT to HEP secondary to progress made.    Personal Factors and Comorbidities Fitness    Examination-Activity Limitations Caring for Others;Carry;Locomotion Level;Dressing;Stairs    Examination-Participation Restrictions Church;Community Activity;Driving;Laundry;Meal Prep    Stability/Clinical Decision Making Stable/Uncomplicated    Rehab Potential Good    PT Frequency 2x / week    PT Duration 4 weeks    PT Treatment/Interventions ADLs/Self Care Home Management;Gait training;DME Instruction;Stair training;Functional mobility training;Therapeutic activities;Therapeutic exercise;Balance training;Neuromuscular re-education;Patient/family education;Manual techniques;Aquatic Therapy    PT Next Visit Plan DC to HEP    PT Home Exercise Plan SLS with UE support - only as much as needed; 5/17 tandem, STS, lateral stepping    Consulted and Agree with Plan of Care Patient           Patient will benefit from skilled therapeutic intervention in order to improve the following deficits and impairments:  Abnormal gait,Decreased activity tolerance,Decreased balance,Difficulty walking,Decreased strength,Pain  Visit Diagnosis: Balance problem  Difficulty in walking, not elsewhere classified  Muscle weakness (generalized)     Problem List Patient Active Problem List   Diagnosis Date Noted  . Hemiparesis affecting left side as late effect of stroke ([place]) 09/13/2020  . Alteration of sensation as late effect of stroke 09/13/2020  . Dysesthesia 09/13/2020  . Abnormality of gait 09/13/2020  . Cognitive deficit, post-stroke 09/13/2020  . Pressure injury of skin 07/18/2020  . Labile blood pressure   . Left foot pain   . Attention and concentration deficit   . History of hypertension   . Hypotension due to drugs   . Labile blood glucose   . Hypokalemia   . Essential hypertension   . Controlled type 2  diabetes mellitus with hyperglycemia, without long-term current use of insulin ([place])   . ICH (intracerebral hemorrhage) ([place]) 06/23/2020  . Pelvic floor relaxation 03/07/2020  . Screening for colorectal cancer 12/09/2018  . 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 Snoring 06/08/2018  . Poor sleep hygiene 06/08/2018  . Chronic insomnia 06/08/2018  . Encounter for well woman exam with routine gynecological exam 09/09/2017  . Female cystocele 09/09/2017  . Cystocele 07/12/2015  . Precordial pain 02/18/2013  . Type 2 diabetes mellitus ([place]) 02/18/2013  . Hyperlipidemia 02/17/2013  . Essential hypertension, benign 02/17/2013    12:01 PM, 10/23/20 Jerene Pitch, DPT Physical Therapy with [facility]  [phone] office  [place] [address] [place], [place], [address] Phone: [phone]   Fax:  [phone]  Name: Cheryl Ortiz MRN: [id number] Date of Birth: 04/11/1951

## 2020-10-25 ENCOUNTER — Encounter: Payer: Medicare Other | Attending: Registered Nurse | Admitting: Registered Nurse

## 2020-10-25 ENCOUNTER — Encounter: Payer: Self-pay | Admitting: Registered Nurse

## 2020-10-25 ENCOUNTER — Other Ambulatory Visit: Payer: Self-pay

## 2020-10-25 ENCOUNTER — Encounter (HOSPITAL_COMMUNITY): Payer: Medicare Other | Admitting: Physical Therapy

## 2020-10-25 VITALS — BP 149/79 | HR 61 | Temp 97.9°F | Ht 66.0 in | Wt 194.6 lb

## 2020-10-25 DIAGNOSIS — E1165 Type 2 diabetes mellitus with hyperglycemia: Secondary | ICD-10-CM | POA: Insufficient documentation

## 2020-10-25 DIAGNOSIS — I69354 Hemiplegia and hemiparesis following cerebral infarction affecting left non-dominant side: Secondary | ICD-10-CM | POA: Insufficient documentation

## 2020-10-25 DIAGNOSIS — I69398 Other sequelae of cerebral infarction: Secondary | ICD-10-CM | POA: Diagnosis not present

## 2020-10-25 DIAGNOSIS — R209 Unspecified disturbances of skin sensation: Secondary | ICD-10-CM | POA: Insufficient documentation

## 2020-10-25 NOTE — Progress Notes (Signed)
Subjective:    Patient ID: Cheryl Ortiz, female    DOB: May 22, 1950, 70 y.o.   MRN: [id number]  HPI: Cheryl Ortiz is a 70 y.o. female who returns for follow up appointment of her Hemiparesis affecting left side, Alteration of sensation as late effect stroke and Controlled Type 2 DM with hyperglycemia without long-term use of insulin. Cheryl Ortiz states she doesn't have any pain. She rates her pain 0. Her  current exercise regime is attending [employer] two days a week and walking.    Cheryl Ortiz reports a few months ago she was prescribed gabapentin and had developed suicidal ideation, she stopped the gabapentin immediately and reports she no longer has suicidal . Ideation. She admits to feeling stress with her personal life and being a care giver, she was given pamphlet for the [place], she verbalizes understanding.   The above was discussed with Dr Ranell Patrick, Cheryl Ortiz states she has a neurologist and would like to F/U with her neurologist regarding her above diagnosis, this was discussed with Dr Ranell Patrick, she agree with Cheryl Ortiz F/U with her neurologist.    Pain Inventory Average Pain 0 Pain Right Now 0 My pain is  no pain  LOCATION OF PAIN  no pain.  BOWEL Number of stools per week: 5 Oral laxative use No  Type of laxative n/a Enema or suppository use No  History of colostomy No  Incontinent No   BLADDER Normal In and out cath, frequency n/a Able to self cath n Bladder incontinence No  Frequent urination No  Leakage with coughing No  Difficulty starting stream No  Incomplete bladder emptying No    Mobility walk without assistance ability to climb steps?  yes do you drive?  yes  Function retired  Neuro/Psych No problems in this area  Prior Studies Any changes since last visit?  no  Physicians involved in your care Any changes since last visit?  no   Family History  Problem Relation Age of Onset   Hypertension Mother    Arthritis Mother     Cancer Mother        breast    Cerebral palsy Brother    Early death Brother    Hypertension Brother    Heart disease Brother    Pneumonia Father    Hypertension Father    Hyperlipidemia Sister    Breast cancer Sister    Fibroids Sister    Hypertension Brother    Heart attack Brother    Social History   Socioeconomic History   Marital status: Widowed    Spouse name: Not on file   Number of children: Not on file   Years of education: Not on file   Highest education level: Not on file  Occupational History   Not on file  Tobacco Use   Smoking status: Former    Packs/day: 0.50    Years: 12.00    Pack years: 6.00    Types: Cigarettes    Quit date: 08/15/1997    Years since quitting: 23.2   Smokeless tobacco: Never  Vaping Use   Vaping Use: Never used  Substance and Sexual Activity   Alcohol use: Not Currently   Drug use: No   Sexual activity: Not Currently    Birth control/protection: Surgical    Comment: hyst  Other Topics Concern   Not on file  Social History Narrative   Not on file   Social Determinants of Health   Financial Resource Strain: Not  on file  Food Insecurity: Not on file  Transportation Needs: Not on file  Physical Activity: Not on file  Stress: Not on file  Social Connections: Not on file   Past Surgical History:  Procedure Laterality Date   CHOLECYSTECTOMY  1999   COLONOSCOPY N/A 06/15/2014   Procedure: COLONOSCOPY;  Surgeon: Rogene Houston, MD;  [facility];  Service: Endoscopy;  Laterality: N/A;  [facility], URETEROSCOPY AND STENT PLACEMENT Bilateral 08/20/2015   Procedure: CYSTOSCOPY WITH RETROGRADE PYELOGRAM, URETEROSCOPY AND STENT PLACEMENT WITH STONE EXTRACTION WITH BASKET;  Surgeon: Cleon Gustin, MD;  Location: [facility];  Service: Urology;  Laterality: Bilateral;   ESOPHAGEAL DILATION N/A 03/23/2015   Procedure: ESOPHAGEAL DILATION;  Surgeon: Rogene Houston, MD;   [facility];  Service: Endoscopy;  Laterality: N/A;   ESOPHAGOGASTRODUODENOSCOPY N/A 03/23/2015   Procedure: ESOPHAGOGASTRODUODENOSCOPY (EGD);  Surgeon: Rogene Houston, MD;  [facility];  Service: Endoscopy;  Laterality: N/A;  8:55 - moved to 12:35 - Ann to notify pt   FOOT SURGERY Bilateral    HOLMIUM LASER APPLICATION Bilateral 07/20/9177   Procedure: HOLMIUM LASER APPLICATION;  Surgeon: Cleon Gustin, MD;  Location: [facility];  Service: Urology;  Laterality: Bilateral;   VAGINAL HYSTERECTOMY  1984   Past Medical History:  Diagnosis Date   Aneurysm ([place]) 06/2015   brain-small will recheck in 1 year   Cold    Cystocele 07/12/2015   Gout    Hyperlipidemia    Hypertension    Neuropathy of both feet    tingling/numbness bilateral feet ,rt arm,hands-nerve study scheduled   Osteoarthritis    Renal disorder    kidney stones   Seasonal allergies    Stroke ([place])    Type 2 diabetes mellitus ([facility])    BP (!) 156/84   Pulse 61   Temp 97.9 F (36.6 C)   Ht 5\' 6"  (1.676 m)   Wt 194 lb 9.6 oz (88.3 kg)   SpO2 98%   BMI 31.41 kg/m   Opioid Risk Score:   Fall Risk Score:  `1  Depression screen PHQ 2/9  Depression screen [facility] 2/9 07/27/2020 12/09/2018 09/09/2017  Decreased Interest 3 0 0  Down, Depressed, Hopeless 1 0 0  PHQ - 2 Score 4 0 0  Altered sleeping 0 - 0  Tired, decreased energy 1 - 2  Change in appetite 0 - 0  Feeling bad or failure about yourself  3 - 0  Trouble concentrating 3 - 0  Moving slowly or fidgety/restless 1 - 0  Suicidal thoughts 0 - 0  PHQ-9 Score 12 - 2  Difficult doing work/chores - - Somewhat difficult    Review of Systems  All other systems reviewed and are negative.     Objective:   Physical Exam        Assessment & Plan:  1.  ICH ( Intracerebral hemorrhage): Hemiparesis affecting left side. Neurology Following. Continue to monitor. 2. Controlled Type 2 DM: Continue current medication regimen. PCP  Following . Continue to monitor.

## 2020-10-26 ENCOUNTER — Ambulatory Visit
Admission: RE | Admit: 2020-10-26 | Discharge: 2020-10-26 | Disposition: A | Discharge status: Home / Self Care | Payer: Medicare Other | Source: Ambulatory Visit | Attending: Neurology | Admitting: Neurology

## 2020-10-26 DIAGNOSIS — I671 Cerebral aneurysm, nonruptured: Secondary | ICD-10-CM

## 2020-10-26 DIAGNOSIS — I611 Nontraumatic intracerebral hemorrhage in hemisphere, cortical: Secondary | ICD-10-CM

## 2020-10-26 MED ORDER — GADOBENATE DIMEGLUMINE 529 MG/ML IV SOLN
19.0000 mL | Freq: Once | INTRAVENOUS | Status: AC | PRN
  Administered 2020-10-26: 19 mL via INTRAVENOUS

## 2020-10-27 ENCOUNTER — Telehealth: Payer: Self-pay | Admitting: Neurology

## 2020-10-27 NOTE — Telephone Encounter (Signed)
I called the patient.  MRI of the brain shows evolutionary changes from the hemorrhage in February 2022.  MRA of the head is unremarkable, no source of hemorrhage is noted.  The patient is doing well, she will keep her appointment in September 2022.   MRI brain 10/26/20:  IMPRESSION: This MRI of the brain with and without contrast shows the following: 1.   Further evolution of the large right frontal intraparenchymal hemorrhage and smaller left frontal intraparenchymal hemorrhage.  There is some enhancement in the frontal lobe surrounding the hemorrhage.  Mass-effect has resolved.  Hemosiderin deposition is also noted in the anterior falx and some of the cortical sulci. 2.   Chronic lacunar infarction in the right cerebellar hemisphere.  Scattered T2/FLAIR hyperintense foci in the hemispheres consistent with chronic microvascular ischemic change.  This has slightly increased compared to the 06/23/2020 MRI.   MRA head 10/26/20:  IMPRESSION: This is a normal MR angiogram of the intracranial arteries.

## 2020-10-29 ENCOUNTER — Ambulatory Visit (INDEPENDENT_AMBULATORY_CARE_PROVIDER_SITE_OTHER): Payer: Medicare Other | Admitting: Obstetrics & Gynecology

## 2020-10-29 ENCOUNTER — Encounter: Payer: Self-pay | Admitting: Obstetrics & Gynecology

## 2020-10-29 ENCOUNTER — Other Ambulatory Visit: Payer: Self-pay

## 2020-10-29 VITALS — BP 139/75 | HR 71 | Ht 66.0 in | Wt 196.0 lb

## 2020-10-29 DIAGNOSIS — Z4689 Encounter for fitting and adjustment of other specified devices: Secondary | ICD-10-CM | POA: Diagnosis not present

## 2020-10-29 DIAGNOSIS — N993 Prolapse of vaginal vault after hysterectomy: Secondary | ICD-10-CM | POA: Diagnosis not present

## 2020-10-29 DIAGNOSIS — N8110 Cystocele, unspecified: Secondary | ICD-10-CM | POA: Diagnosis not present

## 2020-10-29 NOTE — Progress Notes (Signed)
Chief Complaint  Patient presents with   Pessary Check    Blood pressure 139/75, pulse 71, height 5\' 6"  (1.676 m), weight 196 lb (88.9 kg).  Cheryl Ortiz presents today for routine follow up related to her pessary.   She uses a Milex ring with support #4 She reports no vaginal discharge and no vaginal bleeding   Likert scale(1 not bothersome -5 very bothersome)  :  1  Exam reveals no undue vaginal mucosal pressure of breakdown, no discharge and no vaginal bleeding.  Vaginal Epithelial Abnormality Classification System:   0 0    No abnormalities 1    Epithelial erythema 2    Granulation tissue 3    Epithelial break or erosion, 1 cm or less 4    Epithelial break or erosion, 1 cm or greater  The pessary is removed, cleaned and replaced without difficulty.      ICD-10-CM   1. Pessary maintenance, Milex ring with support #4, original fit 11/21  Z46.89     2. POP-Q stage 3 cystocele: chronic/stable, well managed with Pessary  N81.10     3. 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  Florian Buff, MD  10/29/2020 11:28 AM

## 2020-11-15 ENCOUNTER — Other Ambulatory Visit (HOSPITAL_COMMUNITY): Payer: Self-pay | Admitting: Family Medicine

## 2020-11-15 DIAGNOSIS — Z1231 Encounter for screening mammogram for malignant neoplasm of breast: Secondary | ICD-10-CM

## 2020-11-27 ENCOUNTER — Encounter (HOSPITAL_COMMUNITY): Payer: Self-pay | Admitting: Emergency Medicine

## 2020-11-27 ENCOUNTER — Other Ambulatory Visit: Payer: Self-pay

## 2020-11-27 ENCOUNTER — Emergency Department (HOSPITAL_COMMUNITY): Payer: Medicare Other

## 2020-11-27 ENCOUNTER — Emergency Department (HOSPITAL_COMMUNITY)
Admission: EM | Admit: 2020-11-27 | Discharge: 2020-11-27 | Disposition: A | Discharge status: Home / Self Care | Payer: Medicare Other | Attending: Emergency Medicine | Admitting: Emergency Medicine

## 2020-11-27 DIAGNOSIS — R202 Paresthesia of skin: Secondary | ICD-10-CM | POA: Diagnosis not present

## 2020-11-27 DIAGNOSIS — Z8673 Personal history of transient ischemic attack (TIA), and cerebral infarction without residual deficits: Secondary | ICD-10-CM | POA: Insufficient documentation

## 2020-11-27 DIAGNOSIS — I10 Essential (primary) hypertension: Secondary | ICD-10-CM | POA: Diagnosis not present

## 2020-11-27 DIAGNOSIS — R531 Weakness: Secondary | ICD-10-CM | POA: Insufficient documentation

## 2020-11-27 DIAGNOSIS — I629 Nontraumatic intracranial hemorrhage, unspecified: Secondary | ICD-10-CM | POA: Diagnosis not present

## 2020-11-27 DIAGNOSIS — G8929 Other chronic pain: Secondary | ICD-10-CM | POA: Diagnosis not present

## 2020-11-27 DIAGNOSIS — G4489 Other headache syndrome: Secondary | ICD-10-CM | POA: Diagnosis not present

## 2020-11-27 DIAGNOSIS — Z87891 Personal history of nicotine dependence: Secondary | ICD-10-CM | POA: Diagnosis not present

## 2020-11-27 DIAGNOSIS — Z79899 Other long term (current) drug therapy: Secondary | ICD-10-CM | POA: Insufficient documentation

## 2020-11-27 DIAGNOSIS — Y900 Blood alcohol level of less than 20 mg/100 ml: Secondary | ICD-10-CM | POA: Diagnosis not present

## 2020-11-27 DIAGNOSIS — Z7984 Long term (current) use of oral hypoglycemic drugs: Secondary | ICD-10-CM | POA: Insufficient documentation

## 2020-11-27 DIAGNOSIS — E1140 Type 2 diabetes mellitus with diabetic neuropathy, unspecified: Secondary | ICD-10-CM | POA: Diagnosis not present

## 2020-11-27 DIAGNOSIS — Z20822 Contact with and (suspected) exposure to covid-19: Secondary | ICD-10-CM | POA: Insufficient documentation

## 2020-11-27 DIAGNOSIS — R519 Headache, unspecified: Secondary | ICD-10-CM | POA: Insufficient documentation

## 2020-11-27 LAB — I-STAT CHEM 8, ED
BUN: 16 mg/dL (ref 8–23)
Calcium, Ion: 1.23 mmol/L (ref 1.15–1.40)
Chloride: 104 mmol/L (ref 98–111)
Creatinine, Ser: 0.9 mg/dL (ref 0.44–1.00)
Glucose, Bld: 95 mg/dL (ref 70–99)
HCT: 43 % (ref 36.0–46.0)
Hemoglobin: 14.6 g/dL (ref 12.0–15.0)
Potassium: 3.8 mmol/L (ref 3.5–5.1)
Sodium: 141 mmol/L (ref 135–145)
TCO2: 27 mmol/L (ref 22–32)

## 2020-11-27 LAB — URINALYSIS, ROUTINE W REFLEX MICROSCOPIC
Bilirubin Urine: NEGATIVE
Glucose, UA: NEGATIVE mg/dL
Ketones, ur: NEGATIVE mg/dL
Nitrite: NEGATIVE
Protein, ur: NEGATIVE mg/dL
Specific Gravity, Urine: 1.014 (ref 1.005–1.030)
pH: 7 (ref 5.0–8.0)

## 2020-11-27 LAB — COMPREHENSIVE METABOLIC PANEL
ALT: 13 U/L (ref 0–44)
AST: 19 U/L (ref 15–41)
Albumin: 4.2 g/dL (ref 3.5–5.0)
Alkaline Phosphatase: 57 U/L (ref 38–126)
Anion gap: 10 (ref 5–15)
BUN: 15 mg/dL (ref 8–23)
CO2: 24 mmol/L (ref 22–32)
Calcium: 9.6 mg/dL (ref 8.9–10.3)
Chloride: 105 mmol/L (ref 98–111)
Creatinine, Ser: 0.88 mg/dL (ref 0.44–1.00)
GFR, Estimated: >60 mL/min (ref >60)
Glucose, Bld: 97 mg/dL (ref 70–99)
Potassium: 3.8 mmol/L (ref 3.5–5.1)
Sodium: 139 mmol/L (ref 135–145)
Total Bilirubin: 0.8 mg/dL (ref 0.3–1.2)
Total Protein: 7.3 g/dL (ref 6.5–8.1)

## 2020-11-27 LAB — RAPID URINE DRUG SCREEN, HOSP PERFORMED
Amphetamines: NOT DETECTED
Barbiturates: NOT DETECTED
Benzodiazepines: NOT DETECTED
Cocaine: NOT DETECTED
Opiates: NOT DETECTED
Tetrahydrocannabinol: NOT DETECTED

## 2020-11-27 LAB — DIFFERENTIAL
Abs Immature Granulocytes: 0.01 10*3/uL (ref 0.00–0.07)
Basophils Absolute: 0.1 10*3/uL (ref 0.0–0.1)
Basophils Relative: 1 %
Eosinophils Absolute: 0.1 10*3/uL (ref 0.0–0.5)
Eosinophils Relative: 2 %
Immature Granulocytes: 0 %
Lymphocytes Relative: 33 %
Lymphs Abs: 1.6 10*3/uL (ref 0.7–4.0)
Monocytes Absolute: 0.3 10*3/uL (ref 0.1–1.0)
Monocytes Relative: 6 %
Neutro Abs: 2.7 10*3/uL (ref 1.7–7.7)
Neutrophils Relative %: 58 %

## 2020-11-27 LAB — ETHANOL: Alcohol, Ethyl (B): <10 mg/dL (ref <10)

## 2020-11-27 LAB — APTT: aPTT: 27 seconds (ref 24–36)

## 2020-11-27 LAB — CBC
HCT: 42 % (ref 36.0–46.0)
Hemoglobin: 13.7 g/dL (ref 12.0–15.0)
MCH: 30 pg (ref 26.0–34.0)
MCHC: 32.6 g/dL (ref 30.0–36.0)
MCV: 91.9 fL (ref 80.0–100.0)
Platelets: 266 10*3/uL (ref 150–400)
RBC: 4.57 MIL/uL (ref 3.87–5.11)
RDW: 14.3 % (ref 11.5–15.5)
WBC: 4.7 10*3/uL (ref 4.0–10.5)
nRBC: 0 % (ref 0.0–0.2)

## 2020-11-27 LAB — RESP PANEL BY RT-PCR (FLU A&B, COVID) ARPGX2
Influenza A by PCR: NEGATIVE
Influenza B by PCR: NEGATIVE
SARS Coronavirus 2 by RT PCR: NEGATIVE

## 2020-11-27 LAB — PROTIME-INR
INR: 1 (ref 0.8–1.2)
Prothrombin Time: 13.1 seconds (ref 11.4–15.2)

## 2020-11-27 NOTE — Discharge Instructions (Addendum)
If you develop continued, recurrent, or worsening headache, fever, neck stiffness, vomiting, blurry or double vision, weakness or numbness in your arms or legs, trouble speaking, or any other new/concerning symptoms then return to the ER for evaluation.  

## 2020-11-27 NOTE — ED Notes (Signed)
EDP made aware of patient's symptoms

## 2020-11-27 NOTE — ED Provider Notes (Signed)
[facility] EMERGENCY DEPARTMENT Provider Note   CSN: [id number] Arrival date & time: 11/27/20  1047     History Chief Complaint  Patient presents with   Headache    Cheryl Ortiz is a 70 y.o. female.  HPI 70 year old female presents with headache.  She had head bleed back in February and this headache feels similar.  Started a couple hours prior to arrival.  She states that she has been getting intermittent headaches ever since the original head bleed but these have not been in the severity that it is today.  Is about a 9 out of 10 and is sharp and frontal, going to the back of her head.  No vomiting or visual complaints.  Chronically has some left leg weakness as a residual deficit that is unchanged today.  However over the last 2 days she has been having intermittent painful radiation going from her forearm to her hand on the left side.  Some tingling in that hand as well.  Has not take anything for the pain.  Past Medical History:  Diagnosis Date   Aneurysm ([place]) 06/2015   brain-small will recheck in 1 year   Cold    Cystocele 07/12/2015   Gout    Hyperlipidemia    Hypertension    Neuropathy of both feet    tingling/numbness bilateral feet ,rt arm,hands-nerve study scheduled   Osteoarthritis    Renal disorder    kidney stones   Seasonal allergies    Stroke [facility])    Type 2 diabetes mellitus [facility])     Patient Active Problem List   Diagnosis Date Noted   Hemiparesis affecting left side as late effect of stroke ([place]) 09/13/2020   Alteration of sensation as late effect of stroke 09/13/2020   Dysesthesia 09/13/2020   Abnormality of gait 09/13/2020   Cognitive deficit, post-stroke 09/13/2020   Pressure injury of skin 07/18/2020   Labile blood pressure    Left foot pain    Attention and concentration deficit    History of hypertension    Hypotension due to drugs    Labile blood glucose    Hypokalemia    Essential hypertension    Controlled type 2 diabetes mellitus  with hyperglycemia, without long-term current use of insulin ([place])    [place] (intracerebral hemorrhage) ([place]) 06/23/2020   Pelvic floor relaxation 03/07/2020   Screening for colorectal cancer 12/09/2018   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 06/08/2018   Snoring 06/08/2018   Poor sleep hygiene 06/08/2018   Chronic insomnia 06/08/2018   Encounter for well woman exam with routine gynecological exam 09/09/2017   Female cystocele 09/09/2017   Cystocele 07/12/2015   Precordial pain 02/18/2013   Type 2 diabetes mellitus ([place]) 02/18/2013   Hyperlipidemia 02/17/2013   Essential hypertension, benign 02/17/2013    Past Surgical History:  Procedure Laterality Date   CHOLECYSTECTOMY  1999   COLONOSCOPY N/A 06/15/2014   Procedure: COLONOSCOPY;  Surgeon: Rogene Houston, MD;  [facility];  Service: Endoscopy;  Laterality: N/A;  [place], URETEROSCOPY AND STENT PLACEMENT Bilateral 08/20/2015   Procedure: CYSTOSCOPY WITH RETROGRADE PYELOGRAM, URETEROSCOPY AND STENT PLACEMENT WITH STONE EXTRACTION WITH BASKET;  Surgeon: Cleon Gustin, MD;  Location: [facility];  Service: Urology;  Laterality: Bilateral;   ESOPHAGEAL DILATION N/A 03/23/2015   Procedure: ESOPHAGEAL DILATION;  Surgeon: Rogene Houston, MD;  [facility];  Service: Endoscopy;  Laterality: N/A;   ESOPHAGOGASTRODUODENOSCOPY N/A  03/23/2015   Procedure: ESOPHAGOGASTRODUODENOSCOPY (EGD);  Surgeon: Rogene Houston, MD;  [facility];  Service: Endoscopy;  Laterality: N/A;  8:55 - moved to 12:35 - Ann to notify pt   FOOT SURGERY Bilateral    HOLMIUM LASER APPLICATION Bilateral 0/06/7251   Procedure: HOLMIUM LASER APPLICATION;  Surgeon: Cleon Gustin, MD;  Location: [facility];  Service: Urology;  Laterality: Bilateral;   VAGINAL HYSTERECTOMY  1984     OB History     Gravida  2   Para  1   Term      Preterm      AB  1    Living  1      SAB  1   IAB      Ectopic      Multiple      Live Births  1           Family History  Problem Relation Age of Onset   Hypertension Mother    Arthritis Mother    Cancer Mother        breast    Cerebral palsy Brother    Early death Brother    Hypertension Brother    Heart disease Brother    Pneumonia Father    Hypertension Father    Hyperlipidemia Sister    Breast cancer Sister    Fibroids Sister    Hypertension Brother    Heart attack Brother     Social History   Tobacco Use   Smoking status: Former    Packs/day: 0.50    Years: 12.00    Pack years: 6.00    Types: Cigarettes    Quit date: 08/15/1997    Years since quitting: 23.3   Smokeless tobacco: Never  Vaping Use   Vaping Use: Never used  Substance Use Topics   Alcohol use: Not Currently   Drug use: No    Home Medications Prior to Admission medications   Medication Sig Start Date End Date Taking? Authorizing Provider  albuterol (VENTOLIN HFA) 108 (90 Base) MCG/ACT inhaler Inhale 2 puffs into the lungs every 4 (four) hours as needed for wheezing or shortness of breath. 07/18/20  Yes Angiulli, Lavon Paganini, PA-C  diclofenac Sodium (VOLTAREN) 1 % GEL Apply 2 g topically 3 (three) times daily as needed (Pain).   Yes [provider]  metFORMIN (GLUCOPHAGE) 1000 MG tablet Take 1 tablet (1,000 mg total) by mouth daily with breakfast. 07/18/20  Yes Angiulli, Lavon Paganini, PA-C  olmesartan (BENICAR) 20 MG tablet Take 20 mg by mouth daily. 09/14/20  Yes [provider]  Vitamin D, Ergocalciferol, (DRISDOL) 1.25 MG (50000 UNIT) CAPS capsule Take 1 capsule by mouth once a week. 10/01/20  Yes [provider]  albuterol (VENTOLIN HFA) 108 (90 Base) MCG/ACT inhaler INHALE 2 PUFFS INTO THE LUNGS EVERY FOUR HOURS AS NEEDED FOR WHEEZING OR SHORTNESS OF BREATH. Patient not taking: No sig reported 07/18/20 07/18/21  Angiulli, Lavon Paganini, PA-C  Lancet Devices [facility] DELICA PLUS LANCING) MISC USE  LANCING DEVICE TWICE DAILY 07/25/20   [provider]    Allergies    Codeine and Prednisone  Review of Systems   Review of Systems  Constitutional:  Negative for fever.  Eyes:  Negative for visual disturbance.  Gastrointestinal:  Negative for vomiting.  Neurological:  Positive for weakness, numbness and headaches.  All other systems reviewed and are negative.  Physical Exam Updated Vital Signs BP (!) 158/77   Pulse Marland Kitchen)  54   Temp 98.8 F (37.1 C) (Oral)   Resp 18   Ht 5\' 6"  (1.676 m)   Wt 82.1 kg   SpO2 98%   BMI 29.21 kg/m   Physical Exam Vitals and nursing note reviewed.  Constitutional:      General: She is not in acute distress.    Appearance: She is well-developed. She is not ill-appearing.  HENT:     Head: Normocephalic and atraumatic.     Right Ear: External ear normal.     Left Ear: External ear normal.     Nose: Nose normal.  Eyes:     General:        Right eye: No discharge.        Left eye: No discharge.     Extraocular Movements: Extraocular movements intact.     Pupils: Pupils are equal, round, and reactive to light.  Cardiovascular:     Rate and Rhythm: Normal rate and regular rhythm.     Heart sounds: Normal heart sounds.  Pulmonary:     Effort: Pulmonary effort is normal.     Breath sounds: Normal breath sounds.  Abdominal:     Palpations: Abdomen is soft.     Tenderness: There is no abdominal tenderness.  Musculoskeletal:     Cervical back: Neck supple.  Skin:    General: Skin is warm and dry.  Neurological:     Mental Status: She is alert and oriented to person, place, and time.     Comments: CN 3-12 grossly intact. 5/5 strength in both upper extremities. 5/5 strength in right lower extremity with mild weakness in left lower extremity. Diffusely decreased sensation in left lower extremity. normal sensation grossly in left forearm/hand. Normal finger to nose.   Psychiatric:        Mood and Affect: Mood is not anxious.    ED  Results / Procedures / Treatments   Labs (all labs ordered are listed, but only abnormal results are displayed) Labs Reviewed  URINALYSIS, ROUTINE W REFLEX MICROSCOPIC - Abnormal; Notable for the following components:      Result Value   Hgb urine dipstick SMALL (*)    Leukocytes,Ua MODERATE (*)    Bacteria, UA RARE (*)    All other components within normal limits  RESP PANEL BY RT-PCR (FLU A&B, COVID) ARPGX2  ETHANOL  PROTIME-INR  APTT  CBC  DIFFERENTIAL  COMPREHENSIVE METABOLIC PANEL  RAPID URINE DRUG SCREEN, HOSP PERFORMED  I-STAT CHEM 8, ED    EKG EKG Interpretation  Date/Time:  Tuesday November 27 2020 11:01:57 EDT Ventricular Rate:  60 PR Interval:  147 QRS Duration: 96 QT Interval:  420 QTC Calculation: 420 R Axis:   -1 Text Interpretation: Sinus rhythm Abnormal R-wave progression, early transition no acute ST/T changes similar to Feb 2022 Confirmed by Sherwood Gambler [phone]) on 11/27/2020 12:20:41 PM  Radiology CT HEAD WO CONTRAST  Result Date: 11/27/2020 CLINICAL DATA:  Cerebral hemorrhage suspected.  Headache. EXAM: CT HEAD WITHOUT CONTRAST TECHNIQUE: Contiguous axial images were obtained from the base of the skull through the vertex without intravenous contrast. COMPARISON:  MRI October 26, 2020 (without report). CT head June 28, 2020. FINDINGS: Brain: Focal hypoattenuation in the right operculum and the parasagittal right greater than left anterior frontal lobes. Additional scattered patchy white matter hypoattenuation, likely the sequela of chronic microvascular ischemic disease. Remote right cerebellar lacunar infarct. No evidence of acute hemorrhage. No evidence of acute large vascular territory infarct. No hydrocephalus. No mass  lesion or abnormal mass effect. Mineralization of bilateral basal ganglia. Vascular: No hyperdense vessel identified. Calcific intracranial atherosclerosis. Skull: No acute fracture. Sinuses/Orbits: Visualized sinuses are largely clear. No  acute orbital findings. Other: No mastoid effusions. IMPRESSION: 1. No evidence of acute intracranial abnormality. 2. Focal hypoattenuation in the right operculum and the parasagittal right greater than left anterior frontal lobes, likely the sequela of the patient's prior hemorrhage when correlating with the priors. An MRI could provide more sensitive evaluation for acute infarct if clinically indicated. 3. Mild to moderate chronic microvascular ischemic disease with remote right cerebellar lacunar infarct. Electronically Signed   By: Margaretha Sheffield MD   On: 11/27/2020 13:21    Procedures Procedures   Medications Ordered in ED Medications - No data to display  ED Course  I have reviewed the triage vital signs and the nursing notes.  Pertinent labs & imaging results that were available during my care of the patient were reviewed by me and considered in my medical decision making (see chart for details).    MDM Rules/Calculators/A&P                          Besides some left leg weakness, neuro exam is unremarkable.  The intermittent symptoms in her left forearm appear to be pain related rather than numbness or weakness.  No reproducible weakness or numbness on exam.  CT head was obtained given the acute onset of headache this morning and is negative.  Given it was done within a few hours of her symptoms I do not think subarachnoid hemorrhage is likely.  There is no obvious head bleed today and there seems to be expected evolution of her previous hematoma.  I did discuss possible MRI but she declines at this point and would like to go home.  She declines headache treatment.  I doubt this is an acute stroke.  At this point she appears stable for discharge home with return options. Final Clinical Impression(s) / ED Diagnoses Final diagnoses:  Frontal headache    Rx / DC Orders ED Discharge Orders     None        Sherwood Gambler, MD 11/27/20 1550

## 2020-11-27 NOTE — ED Triage Notes (Signed)
Pt to the ED with intermittent left arm pain and tingling since Sunday and a headache that began today.  Pt has a hx of a brain hemorrhage in February that began with the same symptoms.

## 2020-11-28 ENCOUNTER — Telehealth: Payer: Self-pay | Admitting: Neurology

## 2020-11-28 NOTE — Telephone Encounter (Signed)
Pt was in the ED on yesterday wants a sooner appt, requesting a call back.

## 2020-11-28 NOTE — Telephone Encounter (Signed)
Pt scheduled for 12/19/20 at 1200 pm. Pt advised to arrived at 1130 for the f/u appt.

## 2020-11-29 ENCOUNTER — Encounter: Payer: Self-pay | Admitting: Adult Health

## 2020-11-29 ENCOUNTER — Ambulatory Visit (INDEPENDENT_AMBULATORY_CARE_PROVIDER_SITE_OTHER): Payer: Medicare Other | Admitting: Adult Health

## 2020-11-29 ENCOUNTER — Other Ambulatory Visit: Payer: Self-pay

## 2020-11-29 VITALS — BP 126/70 | HR 76 | Ht 66.0 in | Wt 186.6 lb

## 2020-11-29 DIAGNOSIS — Z01419 Encounter for gynecological examination (general) (routine) without abnormal findings: Secondary | ICD-10-CM

## 2020-11-29 DIAGNOSIS — Z1211 Encounter for screening for malignant neoplasm of colon: Secondary | ICD-10-CM

## 2020-11-29 DIAGNOSIS — F419 Anxiety disorder, unspecified: Secondary | ICD-10-CM

## 2020-11-29 DIAGNOSIS — F32A Depression, unspecified: Secondary | ICD-10-CM | POA: Diagnosis not present

## 2020-11-29 DIAGNOSIS — Z4689 Encounter for fitting and adjustment of other specified devices: Secondary | ICD-10-CM

## 2020-11-29 DIAGNOSIS — N8110 Cystocele, unspecified: Secondary | ICD-10-CM | POA: Diagnosis not present

## 2020-11-29 DIAGNOSIS — Z1272 Encounter for screening for malignant neoplasm of vagina: Secondary | ICD-10-CM | POA: Diagnosis not present

## 2020-11-29 LAB — HEMOCCULT GUIAC POC 1CARD (OFFICE): Fecal Occult Blood, POC: NEGATIVE

## 2020-11-29 NOTE — Progress Notes (Signed)
Patient ID: Cheryl Ortiz, female   DOB: Apr 22, 1951, 70 y.o.   MRN: [id number] History of Present Illness: Cheryl Ortiz is a 70 year old black female, widowed, sp hysterectomy in for a well woman gyn exam,and pessary maintenance. She says she had brain bleed in February.  PCP is Dr Hilma Favors.   Current Medications, Allergies, Past Medical History, Past Surgical History, Family History and Social History were reviewed in [employer] record.     Review of Systems: Patient denies any daily headaches, hearing loss, fatigue, blurred vision, shortness of breath, chest pain, abdominal pain, problems with bowel movements, urination, or intercourse.(Not active). No joint pain. Off balance at times she says and thinks about death   Physical Exam:BP 126/70 (BP Location: Left Arm, Patient Position: Sitting, Cuff Size: Normal)   Pulse 76   Ht 5\' 6"  (1.676 m)   Wt 186 lb 9.6 oz (84.6 kg)   BMI 30.12 kg/m   General:  Well developed, well nourished, no acute distress Skin:  Warm and dry Neck:  Midline trachea, normal thyroid, good ROM, no lymphadenopathy, no carotid bruits heard  Lungs; Clear to auscultation bilaterally Breast:  No dominant palpable mass, retraction, or nipple discharge Cardiovascular: Regular rate and rhythm Abdomen:  Soft, non tender, no hepatosplenomegaly Pelvic:  External genitalia is normal in appearance, no lesions.  The vagina is pale, no irritation or discharge, pessary removed and washed and dried and reinserted, has +cystocele. Urethra has no lesions or masses. The cervix and uterus are absent. No adnexal masses or tenderness noted.Bladder is non tender, no masses felt. Rectal: Good sphincter tone, no polyps, or hemorrhoids felt.  Hemoccult negative. Extremities/musculoskeletal:  No swelling or varicosities noted, no clubbing or cyanosis Psych:  No mood changes, alert and cooperative,seems happy AA is 0 Fall risk is low Depression screen [facility] 2/9  11/29/2020 10/25/2020 07/27/2020  Decreased Interest 1 0 3  Down, Depressed, Hopeless 1 0 1  PHQ - 2 Score 2 0 4  Altered sleeping 1 - 0  Tired, decreased energy 1 - 1  Change in appetite 1 - 0  Feeling bad or failure about yourself  1 - 3  Trouble concentrating 1 - 3  Moving slowly or fidgety/restless 0 - 1  Suicidal thoughts 1 - 0  PHQ-9 Score 8 - 12  Difficult doing work/chores - - -   REFERRED to [employer] health  GAD 7 : Generalized Anxiety Score 11/29/2020  Nervous, Anxious, on Edge 1  Control/stop worrying 1  Worry too much - different things 1  Trouble relaxing 1  Restless 1  Easily annoyed or irritable 1  Afraid - awful might happen 0  Total GAD 7 Score 6    Upstream - 11/29/20 1133       Pregnancy Intention Screening   Does the patient want to become pregnant in the next year? N/A    Does the patient's partner want to become pregnant in the next year? N/A    Would the patient like to discuss contraceptive options today? N/A      Contraception Wrap Up   Current Method Female Sterilization   hysterectomy   End Method Female Sterilization    Contraception Counseling Provided No            Examination chaperoned by Celene Squibb LPN    Impression and Plan: 1. Encounter for well woman exam with routine gynecological exam Physical with PCP in 1 year  Mammogram yearly Labs with PCP Colonoscopy per GI  2. Encounter for screening fecal occult blood testing   3. Pessary maintenance, Milex ring with support #4, original fit 11/21 Return in 3 months for pessary maintenance   4. POP-Q stage 3 cystocele: chronic/stable, well managed with Pessary   5. Anxiety and depression Referred to [facility] If has any thoughts of hurting herself, go to ER and she voices agreement

## 2020-11-30 DIAGNOSIS — E663 Overweight: Secondary | ICD-10-CM | POA: Diagnosis not present

## 2020-11-30 DIAGNOSIS — E118 Type 2 diabetes mellitus with unspecified complications: Secondary | ICD-10-CM | POA: Diagnosis not present

## 2020-11-30 DIAGNOSIS — Z6829 Body mass index (BMI) 29.0-29.9, adult: Secondary | ICD-10-CM | POA: Diagnosis not present

## 2020-11-30 DIAGNOSIS — E782 Mixed hyperlipidemia: Secondary | ICD-10-CM | POA: Diagnosis not present

## 2020-11-30 DIAGNOSIS — I10 Essential (primary) hypertension: Secondary | ICD-10-CM | POA: Diagnosis not present

## 2020-11-30 DIAGNOSIS — I639 Cerebral infarction, unspecified: Secondary | ICD-10-CM | POA: Diagnosis not present

## 2020-11-30 DIAGNOSIS — I700 Atherosclerosis of aorta: Secondary | ICD-10-CM | POA: Diagnosis not present

## 2020-12-10 ENCOUNTER — Telehealth: Payer: Self-pay | Admitting: Adult Health

## 2020-12-10 NOTE — Telephone Encounter (Signed)
Patient called stating that she would like a referral for [place], patient states the last time she was in the office seeing Anderson Malta they discussed it. Please contact pt

## 2020-12-10 NOTE — Telephone Encounter (Signed)
Pt aware that I sent referral 11/29/20 will get Daisy to follow up on this.

## 2020-12-11 ENCOUNTER — Ambulatory Visit: Payer: Medicare Other | Admitting: Obstetrics & Gynecology

## 2020-12-19 ENCOUNTER — Other Ambulatory Visit: Payer: Self-pay

## 2020-12-19 ENCOUNTER — Ambulatory Visit (INDEPENDENT_AMBULATORY_CARE_PROVIDER_SITE_OTHER): Payer: Medicare Other | Admitting: Neurology

## 2020-12-19 ENCOUNTER — Encounter: Payer: Self-pay | Admitting: Neurology

## 2020-12-19 VITALS — BP 136/79 | HR 68 | Ht 66.0 in | Wt 181.0 lb

## 2020-12-19 DIAGNOSIS — I611 Nontraumatic intracerebral hemorrhage in hemisphere, cortical: Secondary | ICD-10-CM

## 2020-12-19 NOTE — Progress Notes (Signed)
Reason for visit: Intracranial hemorrhage  Cheryl Ortiz is an 70 y.o. female  History of present illness:  [name] is a 70 year old right-handed black female with a history of diabetes, hypertension, and prior bifrontal intracranial hemorrhage.  The source of the hemorrhage was not ever determined.  The patient has gradually improved following the initial event.  She was in the emergency room on 27 November 2020 with a severe headache, but this has not recurred.  The patient may have intermittent pressure sensations in the frontal and occipital area but the pain is not severe.  She has had some improvement in memory, her balance is still not completely normal but she exercises regularly at the [facility] and works on balance there.  She has not had any falls.  She may occasionally drop things out of her hands.  She reports no focal numbness or weakness of the face, arms, or legs.  She returns to this office for an evaluation.  Past Medical History:  Diagnosis Date   Aneurysm ([place]) 06/2015   brain-small will recheck in 1 year   Cold    Cystocele 07/12/2015   Gout    Hyperlipidemia    Hypertension    Neuropathy of both feet    tingling/numbness bilateral feet ,rt arm,hands-nerve study scheduled   Osteoarthritis    Renal disorder    kidney stones   Seasonal allergies    Stroke ([place])    Type 2 diabetes mellitus ([place])     Past Surgical History:  Procedure Laterality Date   CHOLECYSTECTOMY  1999   COLONOSCOPY N/A 06/15/2014   Procedure: COLONOSCOPY;  Surgeon: Rogene Houston, MD;  [facility];  Service: Endoscopy;  Laterality: N/A;  [place], URETEROSCOPY AND STENT PLACEMENT Bilateral 08/20/2015   Procedure: CYSTOSCOPY WITH RETROGRADE PYELOGRAM, URETEROSCOPY AND STENT PLACEMENT WITH STONE EXTRACTION WITH BASKET;  Surgeon: Cleon Gustin, MD;  Location: [facility];  Service: Urology;  Laterality: Bilateral;   ESOPHAGEAL  DILATION N/A 03/23/2015   Procedure: ESOPHAGEAL DILATION;  Surgeon: Rogene Houston, MD;  [facility];  Service: Endoscopy;  Laterality: N/A;   ESOPHAGOGASTRODUODENOSCOPY N/A 03/23/2015   Procedure: ESOPHAGOGASTRODUODENOSCOPY (EGD);  Surgeon: Rogene Houston, MD;  [facility];  Service: Endoscopy;  Laterality: N/A;  8:55 - moved to 12:35 - Ann to notify pt   FOOT SURGERY Bilateral    HOLMIUM LASER APPLICATION Bilateral [id number]   Procedure: HOLMIUM LASER APPLICATION;  Surgeon: Cleon Gustin, MD;  Location: [facility];  Service: Urology;  Laterality: Bilateral;   VAGINAL HYSTERECTOMY  1984    Family History  Problem Relation Age of Onset   Hypertension Mother    Arthritis Mother    Cancer Mother        breast    Cerebral palsy Brother    Early death Brother    Hypertension Brother    Heart disease Brother    Pneumonia Father    Hypertension Father    Hyperlipidemia Sister    Breast cancer Sister    Fibroids Sister    Hypertension Brother    Heart attack Brother     Social history:  reports that she quit smoking about 23 years ago. Her smoking use included cigarettes. She has a 6.00 pack-year smoking history. She has never used smokeless tobacco. She reports previous alcohol use. She reports that she does not use drugs.    Allergies  Allergen  Reactions   Codeine Other (See Comments)    Sensitivity, "makes high" Other reaction(s): Messes with mind, Unknown   Prednisone Other (See Comments)    High blood sugar. Other reaction(s): Unknown    Medications:  Prior to Admission medications   Medication Sig Start Date End Date Taking? Authorizing Provider  acetaminophen (TYLENOL) 500 MG tablet Take 500 mg by mouth every 6 (six) hours as needed.   Yes [provider]  albuterol (VENTOLIN HFA) 108 (90 Base) MCG/ACT inhaler Inhale 2 puffs into the lungs every 4 (four) hours as needed for wheezing or shortness of breath. 07/18/20  Yes  Angiulli, Lavon Paganini, PA-C  Ascorbic Acid (VITAMIN C) 1000 MG tablet Take 1,000 mg by mouth daily.   Yes [provider]  calcium carbonate (OSCAL) 1500 (600 Ca) MG TABS tablet Take by mouth 2 (two) times daily with a meal.   Yes [provider]  diclofenac Sodium (VOLTAREN) 1 % GEL Apply 2 g topically 3 (three) times daily as needed (Pain).   Yes [provider]  Lancet Devices [facility] DELICA PLUS LANCING) [place]  07/25/20  Yes [provider]  lidocaine (LIDODERM) 5 % Place 1 patch onto the skin daily. Remove & Discard patch within 12 hours or as directed by MD   Yes [provider]  metFORMIN (GLUCOPHAGE) 500 MG tablet Take 250 mg by mouth 2 (two) times daily with a meal.   Yes [provider]  olmesartan (BENICAR) 20 MG tablet Take 20 mg by mouth daily. 09/14/20  Yes [provider]  UNABLE TO FIND Med Name: [facility] said   Yes [provider]  Vitamin D, Ergocalciferol, (DRISDOL) 1.25 MG (50000 UNIT) CAPS capsule Take 1 capsule by mouth once a week. 10/01/20  Yes [provider]    ROS:  Out of a complete 14 system review of symptoms, the patient complains only of the following symptoms, and all other reviewed systems are negative.  Headache, head pressure Slight balance problems Mild memory disturbance  Blood pressure 136/79, pulse 68, height '5\' 6"'$  (1.676 m), weight 181 lb (82.1 kg).  Physical Exam  General: The patient is alert and cooperative at the time of the examination.  Skin: No significant peripheral edema is noted.   Neurologic Exam  Mental status: The patient is alert and oriented x 3 at the time of the examination. The patient has apparent normal recent and remote memory, with an apparently normal attention span and concentration ability.   Cranial nerves: Facial symmetry is present. Speech is normal, no aphasia or dysarthria is noted. Extraocular movements are full. Visual fields are  full.  Motor: The patient has good strength in all 4 extremities.  Sensory examination: Soft touch sensation is symmetric on the face, arms, and legs.  Coordination: The patient has good finger-nose-finger and heel-to-shin bilaterally.  Gait and station: The patient has a normal gait. Tandem gait is slightly unsteady. Romberg is negative. No drift is seen.  Reflexes: Deep tendon reflexes are symmetric.   CT head 11/27/20:  IMPRESSION: 1. No evidence of acute intracranial abnormality. 2. Focal hypoattenuation in the right operculum and the parasagittal right greater than left anterior frontal lobes, likely the sequela of the patient's prior hemorrhage when correlating with the priors. An MRI could provide more sensitive evaluation for acute infarct if clinically indicated. 3. Mild to moderate chronic microvascular ischemic disease with remote right cerebellar lacunar infarct.  * CT scan images were reviewed online. I agree with the  written report.    Assessment/Plan:  1.  Bifrontal intracranial hemorrhage  The etiology of her intracranial hemorrhage is not clear.  The patient be sent for further blood work looking at a hypercoagulable profile.  The patient will follow-up at least 1 more time in 6 months.  In the future, she can be seen through Dr. Leonie Man.  Overall, she is doing well.  She has lost 28 pounds since last seen which has improved her diabetes and hypertension.  Jill Alexanders MD 12/19/2020 12:01 PM  [facility] [address] [place], [place] [address]  Phone [phone] Fax [phone]

## 2020-12-20 DIAGNOSIS — Z23 Encounter for immunization: Secondary | ICD-10-CM | POA: Diagnosis not present

## 2020-12-24 ENCOUNTER — Telehealth: Payer: Self-pay | Admitting: Adult Health

## 2020-12-24 NOTE — Telephone Encounter (Signed)
Patient called stating that Behavioral health states that they do not have any availability with their providers and this was in [place] and [employer]. Patient states that she does not want to go to [place], but patient also states that she is doing better and she does not need to see anyone at this point. Pt states it is getting hard to see anyone for behavioral health. Patient states she thinks it was a medication she was taking that made her act that way. Patient states she does not need this referral anymore.

## 2020-12-25 NOTE — Telephone Encounter (Signed)
Left message letting her know glad she is feeling better and that it was a medication, and if she needs anything to call

## 2020-12-27 ENCOUNTER — Ambulatory Visit (HOSPITAL_COMMUNITY)
Admission: RE | Admit: 2020-12-27 | Discharge: 2020-12-27 | Disposition: A | Discharge status: Home / Self Care | Payer: Medicare Other | Source: Ambulatory Visit | Attending: Family Medicine | Admitting: Family Medicine

## 2020-12-27 ENCOUNTER — Other Ambulatory Visit: Payer: Self-pay

## 2020-12-27 DIAGNOSIS — Z1231 Encounter for screening mammogram for malignant neoplasm of breast: Secondary | ICD-10-CM | POA: Insufficient documentation

## 2020-12-31 LAB — PROTEIN S PANEL
Protein S Activity: 69 % (ref 63–140)
Protein S Ag, Free: 81 % (ref 61–136)
Protein S Ag, Total: 106 % (ref 60–150)

## 2020-12-31 LAB — MTHFR DNA ANALYSIS

## 2020-12-31 LAB — LUPUS ANTICOAGULANT
Dilute Viper Venom Time: 32 s (ref 0.0–47.0)
PTT Lupus Anticoagulant: 33.6 s (ref 0.0–51.9)
Thrombin Time: 19.2 s (ref 0.0–23.0)
dPT Confirm Ratio: 0.84 Ratio (ref 0.00–1.34)
dPT: 30.9 s (ref 0.0–47.6)

## 2020-12-31 LAB — PROTEIN C ACTIVITY: Protein C Activity: 128 % (ref 73–180)

## 2020-12-31 LAB — METHYLMALONIC ACID, SERUM: Methylmalonic Acid: 114 nmol/L (ref 0–378)

## 2020-12-31 LAB — FACTOR 5 LEIDEN

## 2021-01-17 DEATH — deceased

## 2021-01-29 ENCOUNTER — Ambulatory Visit: Payer: Medicare Other | Admitting: Neurology

## 2021-02-04 ENCOUNTER — Ambulatory Visit (INDEPENDENT_AMBULATORY_CARE_PROVIDER_SITE_OTHER): Payer: Medicare Other | Admitting: Gastroenterology

## 2021-03-01 ENCOUNTER — Ambulatory Visit: Payer: Medicare Other | Admitting: Obstetrics & Gynecology

## 2021-06-25 NOTE — Progress Notes (Deleted)
PATIENT: Cheryl Ortiz DOB: 05-08-1951  REASON FOR VISIT: Follow up for lobar hemorrhage, right frontal, memory disturbance HISTORY FROM: Patient PRIMARY NEUROLOGIST:   HISTORY OF PRESENT ILLNESS: Today 06/25/21 Cheryl Ortiz here today for follow-up with history of intracranial hemorrhage in February 2022, the source was never determined.  Hypercoagulable work-up in 01/30/21 was unremarkable, no evidence of clotting abnormality was seen.  HISTORY  12/19/2020 Dr .Jannifer Franklin: Cheryl Ortiz is a 71 year old right-handed black female with a history of diabetes, hypertension, and prior bifrontal intracranial hemorrhage.  The source of the hemorrhage was not ever determined.  The patient has gradually improved following the initial event.  She was in the emergency room on 27 November 2020 with a severe headache, but this has not recurred.  The patient may have intermittent pressure sensations in the frontal and occipital area but the pain is not severe.  She has had some improvement in memory, her balance is still not completely normal but she exercises regularly at the [facility] and works on balance there.  She has not had any falls.  She may occasionally drop things out of her hands.  She reports no focal numbness or weakness of the face, arms, or legs.  She returns to this office for an evaluation.  REVIEW OF SYSTEMS: Out of a complete 14 system review of symptoms, the patient complains only of the following symptoms, and all other reviewed systems are negative.  ALLERGIES: Allergies  Allergen Reactions   Codeine Other (See Comments)    Sensitivity, "makes high" Other reaction(s): Messes with mind, Unknown   Prednisone Other (See Comments)    High blood sugar. Other reaction(s): Unknown    HOME MEDICATIONS: Outpatient Medications Prior to Visit  Medication Sig Dispense Refill   acetaminophen (TYLENOL) 500 MG tablet Take 500 mg by mouth every 6 (six) hours as needed.     albuterol (VENTOLIN  HFA) 108 (90 Base) MCG/ACT inhaler Inhale 2 puffs into the lungs every 4 (four) hours as needed for wheezing or shortness of breath. 6.7 g 0   Ascorbic Acid (VITAMIN C) 1000 MG tablet Take 1,000 mg by mouth daily.     calcium carbonate (OSCAL) 1500 (600 Ca) MG TABS tablet Take by mouth 2 (two) times daily with a meal.     diclofenac Sodium (VOLTAREN) 1 % GEL Apply 2 g topically 3 (three) times daily as needed (Pain).     Lancet Devices (ONETOUCH DELICA PLUS LANCING) MISC      lidocaine (LIDODERM) 5 % Place 1 patch onto the skin daily. Remove & Discard patch within 12 hours or as directed by MD     metFORMIN (GLUCOPHAGE) 500 MG tablet Take 250 mg by mouth 2 (two) times daily with a meal.     olmesartan (BENICAR) 20 MG tablet Take 20 mg by mouth daily.     UNABLE TO FIND Med Name: [facility] said     Vitamin D, Ergocalciferol, (DRISDOL) 1.25 MG (50000 UNIT) CAPS capsule Take 1 capsule by mouth once a week.     No facility-administered medications prior to visit.    PAST MEDICAL HISTORY: Past Medical History:  Diagnosis Date   Aneurysm ([place]) 06/2015   brain-small will recheck in 1 year   Cold    Cystocele 07/12/2015   Gout    Hyperlipidemia    Hypertension    Neuropathy of both feet    tingling/numbness bilateral feet ,rt arm,hands-nerve study scheduled   Osteoarthritis    Renal disorder  kidney stones   Seasonal allergies    Stroke ([place])    Type 2 diabetes mellitus ([place])     PAST SURGICAL HISTORY: Past Surgical History:  Procedure Laterality Date   CHOLECYSTECTOMY  1999   COLONOSCOPY N/A 06/15/2014   Procedure: COLONOSCOPY;  Surgeon: Rogene Houston, MD;  [facility];  Service: Endoscopy;  Laterality: N/A;  [place], URETEROSCOPY AND STENT PLACEMENT Bilateral 08/20/2015   Procedure: CYSTOSCOPY WITH RETROGRADE PYELOGRAM, URETEROSCOPY AND STENT PLACEMENT WITH STONE EXTRACTION WITH BASKET;  Surgeon: Cleon Gustin, MD;  Location: [facility];  Service: Urology;  Laterality: Bilateral;   ESOPHAGEAL DILATION N/A 03/23/2015   Procedure: ESOPHAGEAL DILATION;  Surgeon: Rogene Houston, MD;  [facility];  Service: Endoscopy;  Laterality: N/A;   ESOPHAGOGASTRODUODENOSCOPY N/A 03/23/2015   Procedure: ESOPHAGOGASTRODUODENOSCOPY (EGD);  Surgeon: Rogene Houston, MD;  [facility];  Service: Endoscopy;  Laterality: N/A;  8:55 - moved to 12:35 - Ann to notify pt   FOOT SURGERY Bilateral    HOLMIUM LASER APPLICATION Bilateral 06/28/5619   Procedure: HOLMIUM LASER APPLICATION;  Surgeon: Cleon Gustin, MD;  Location: [facility];  Service: Urology;  Laterality: Bilateral;   VAGINAL HYSTERECTOMY  1984    FAMILY HISTORY: Family History  Problem Relation Age of Onset   Hypertension Mother    Arthritis Mother    Cancer Mother        breast    Cerebral palsy Brother    Early death Brother    Hypertension Brother    Heart disease Brother    Pneumonia Father    Hypertension Father    Hyperlipidemia Sister    Breast cancer Sister    Fibroids Sister    Hypertension Brother    Heart attack Brother     SOCIAL HISTORY: Social History   Socioeconomic History   Marital status: Widowed    Spouse name: Not on file   Number of children: Not on file   Years of education: Not on file   Highest education level: Not on file  Occupational History   Not on file  Tobacco Use   Smoking status: Former    Packs/day: 0.50    Years: 12.00    Pack years: 6.00    Types: Cigarettes    Quit date: 08/15/1997    Years since quitting: 23.8   Smokeless tobacco: Never  Vaping Use   Vaping Use: Never used  Substance and Sexual Activity   Alcohol use: Not Currently   Drug use: No   Sexual activity: Not Currently    Birth control/protection: Surgical    Comment: hyst  Other Topics Concern   Not on file  Social History Narrative   Not on file   Social Determinants of Health   Financial  Resource Strain: Not on file  Food Insecurity: No Food Insecurity   Worried About Running Out of Food in the Last Year: Never true   Ran Out of Food in the Last Year: Never true  Transportation Needs: No Transportation Needs   Lack of Transportation (Medical): No   Lack of Transportation (Non-Medical): No  Physical Activity: Sufficiently Active   Days of Exercise per Week: 2 days   Minutes of Exercise per Session: 100 min  Stress: Stress Concern Present   Feeling of Stress : To some extent  Social Connections: Moderately Integrated   Frequency of Communication with Friends and Family: More  than three times a week   Frequency of Social Gatherings with Friends and Family: More than three times a week   Attends Religious Services: 1 to 4 times per year   Active Member of [employer] or Organizations: Yes   Attends Archivist Meetings: More than 4 times per year   Marital Status: Widowed  Human resources officer Violence: Not At Risk   Fear of Current or Ex-Partner: No   Emotionally Abused: No   Physically Abused: No   Sexually Abused: No      PHYSICAL EXAM  There were no vitals filed for this visit. There is no height or weight on file to calculate BMI.  Generalized: Well developed, in no acute distress   Neurological examination  Mentation: Alert oriented to time, place, history taking. Follows all commands speech and language fluent Cranial nerve II-XII: Pupils were equal round reactive to light. Extraocular movements were full, visual field were full on confrontational test. Facial sensation and strength were normal. Uvula tongue midline. Head turning and shoulder shrug  were normal and symmetric. Motor: The motor testing reveals 5 over 5 strength of all 4 extremities. Good symmetric motor tone is noted throughout.  Sensory: Sensory testing is intact to soft touch on all 4 extremities. No evidence of extinction is noted.  Coordination: Cerebellar testing reveals good  finger-nose-finger and heel-to-shin bilaterally.  Gait and station: Gait is normal. Tandem gait is normal. Romberg is negative. No drift is seen.  Reflexes: Deep tendon reflexes are symmetric and normal bilaterally.   DIAGNOSTIC DATA (LABS, IMAGING, TESTING) - I reviewed patient records, labs, notes, testing and imaging myself where available.  Lab Results  Component Value Date   WBC 4.7 11/27/2020   HGB 14.6 11/27/2020   HCT 43.0 11/27/2020   MCV 91.9 11/27/2020   PLT 266 11/27/2020      Component Value Date/Time   NA 141 11/27/2020 1211   K 3.8 11/27/2020 1211   CL 104 11/27/2020 1211   CO2 24 11/27/2020 1149   GLUCOSE 95 11/27/2020 1211   BUN 16 11/27/2020 1211   CREATININE 0.90 11/27/2020 1211   CREATININE 1.07 (H) 04/08/2016 1605   CALCIUM 9.6 11/27/2020 1149   PROT 7.3 11/27/2020 1149   ALBUMIN 4.2 11/27/2020 1149   AST 19 11/27/2020 1149   ALT 13 11/27/2020 1149   ALKPHOS 57 11/27/2020 1149   BILITOT 0.8 11/27/2020 1149   GFRNONAA >60 11/27/2020 1149   GFRAA >60 12/18/2019 0531   Lab Results  Component Value Date   CHOL 133 06/23/2020   HDL 46 06/23/2020   LDLCALC 66 06/23/2020   TRIG 103 06/23/2020   CHOLHDL 2.9 06/23/2020   Lab Results  Component Value Date   HGBA1C 6.2 (H) 06/23/2020   Lab Results  Component Value Date   FWYOVZCH88 502 03/01/2018   No results found for: TSH    ASSESSMENT AND PLAN 71 y.o. year old female       Evangeline Dakin, Mariano Colon 06/25/2021, 2:44 PM [facility] [address], [place], [place] [address] [phone]

## 2021-06-26 ENCOUNTER — Encounter: Payer: Self-pay | Admitting: Neurology

## 2021-06-26 ENCOUNTER — Ambulatory Visit: Payer: Medicare Other | Admitting: Neurology

## 2024-06-09 ENCOUNTER — Encounter (INDEPENDENT_AMBULATORY_CARE_PROVIDER_SITE_OTHER): Payer: Self-pay | Admitting: *Deleted
